# Patient Record
Sex: Female | Born: 1980 | Race: White | Hispanic: No | Marital: Married | Smoking: Never smoker
Health system: Southern US, Community
[De-identification: ages and names within clinical notes are randomized; demographics above are authoritative.]

## PROBLEM LIST (undated history)

## (undated) DIAGNOSIS — R0609 Other forms of dyspnea: Secondary | ICD-10-CM

## (undated) DIAGNOSIS — O1490 Unspecified pre-eclampsia, unspecified trimester: Secondary | ICD-10-CM

## (undated) DIAGNOSIS — IMO0001 Reserved for inherently not codable concepts without codable children: Secondary | ICD-10-CM

## (undated) DIAGNOSIS — R109 Unspecified abdominal pain: Secondary | ICD-10-CM

## (undated) DIAGNOSIS — M1990 Unspecified osteoarthritis, unspecified site: Secondary | ICD-10-CM

## (undated) DIAGNOSIS — R519 Headache, unspecified: Secondary | ICD-10-CM

## (undated) DIAGNOSIS — F319 Bipolar disorder, unspecified: Secondary | ICD-10-CM

## (undated) DIAGNOSIS — R0989 Other specified symptoms and signs involving the circulatory and respiratory systems: Secondary | ICD-10-CM

## (undated) DIAGNOSIS — IMO0002 Reserved for concepts with insufficient information to code with codable children: Secondary | ICD-10-CM

## (undated) DIAGNOSIS — R011 Cardiac murmur, unspecified: Secondary | ICD-10-CM

## (undated) DIAGNOSIS — T7840XA Allergy, unspecified, initial encounter: Secondary | ICD-10-CM

## (undated) DIAGNOSIS — F411 Generalized anxiety disorder: Secondary | ICD-10-CM

## (undated) DIAGNOSIS — F909 Attention-deficit hyperactivity disorder, unspecified type: Secondary | ICD-10-CM

## (undated) DIAGNOSIS — D649 Anemia, unspecified: Secondary | ICD-10-CM

## (undated) DIAGNOSIS — Z87442 Personal history of urinary calculi: Secondary | ICD-10-CM

## (undated) DIAGNOSIS — F3289 Other specified depressive episodes: Secondary | ICD-10-CM

## (undated) DIAGNOSIS — N390 Urinary tract infection, site not specified: Secondary | ICD-10-CM

## (undated) DIAGNOSIS — R609 Edema, unspecified: Secondary | ICD-10-CM

## (undated) DIAGNOSIS — I7300 Raynaud's syndrome without gangrene: Secondary | ICD-10-CM

## (undated) DIAGNOSIS — M797 Fibromyalgia: Secondary | ICD-10-CM

## (undated) DIAGNOSIS — K219 Gastro-esophageal reflux disease without esophagitis: Secondary | ICD-10-CM

## (undated) DIAGNOSIS — R51 Headache: Secondary | ICD-10-CM

## (undated) DIAGNOSIS — F329 Major depressive disorder, single episode, unspecified: Secondary | ICD-10-CM

## (undated) HISTORY — DX: Reserved for concepts with insufficient information to code with codable children: IMO0002

## (undated) HISTORY — DX: Unspecified abdominal pain: R10.9

## (undated) HISTORY — DX: Attention-deficit hyperactivity disorder, unspecified type: F90.9

## (undated) HISTORY — DX: Unspecified osteoarthritis, unspecified site: M19.90

## (undated) HISTORY — DX: Urinary tract infection, site not specified: N39.0

## (undated) HISTORY — DX: Bipolar disorder, unspecified: F31.9

## (undated) HISTORY — PX: TONSILLECTOMY AND ADENOIDECTOMY: SUR1326

## (undated) HISTORY — DX: Generalized anxiety disorder: F41.1

## (undated) HISTORY — DX: Other forms of dyspnea: R06.09

## (undated) HISTORY — DX: Cardiac murmur, unspecified: R01.1

## (undated) HISTORY — PX: HERNIA REPAIR: SHX51

## (undated) HISTORY — DX: Other specified symptoms and signs involving the circulatory and respiratory systems: R09.89

## (undated) HISTORY — PX: SMALL INTESTINE SURGERY: SHX150

## (undated) HISTORY — DX: Anemia, unspecified: D64.9

## (undated) HISTORY — DX: Morbid (severe) obesity due to excess calories: E66.01

## (undated) HISTORY — DX: Reserved for inherently not codable concepts without codable children: IMO0001

## (undated) HISTORY — DX: Unspecified pre-eclampsia, unspecified trimester: O14.90

## (undated) HISTORY — DX: Major depressive disorder, single episode, unspecified: F32.9

## (undated) HISTORY — DX: Other specified depressive episodes: F32.89

## (undated) HISTORY — DX: Gastro-esophageal reflux disease without esophagitis: K21.9

## (undated) HISTORY — PX: LITHOTRIPSY: SUR834

## (undated) HISTORY — DX: Edema, unspecified: R60.9

## (undated) HISTORY — DX: Personal history of urinary calculi: Z87.442

## (undated) HISTORY — PX: INTRAUTERINE DEVICE (IUD) INSERTION: SHX5877

## (undated) HISTORY — DX: Allergy, unspecified, initial encounter: T78.40XA

## (undated) HISTORY — PX: DILATION AND CURETTAGE OF UTERUS: SHX78

---

## 2005-01-23 ENCOUNTER — Emergency Department: Payer: Self-pay | Admitting: Emergency Medicine

## 2005-04-11 ENCOUNTER — Emergency Department: Payer: Self-pay | Admitting: Emergency Medicine

## 2006-02-07 ENCOUNTER — Emergency Department: Payer: Self-pay | Admitting: General Practice

## 2006-02-07 ENCOUNTER — Other Ambulatory Visit: Payer: Self-pay

## 2006-03-16 HISTORY — PX: CHOLECYSTECTOMY: SHX55

## 2006-04-14 ENCOUNTER — Emergency Department: Payer: Self-pay | Admitting: Emergency Medicine

## 2006-04-15 ENCOUNTER — Inpatient Hospital Stay: Payer: Self-pay | Admitting: Surgery

## 2006-04-15 ENCOUNTER — Other Ambulatory Visit: Payer: Self-pay

## 2006-05-09 ENCOUNTER — Inpatient Hospital Stay: Payer: Self-pay

## 2006-12-16 ENCOUNTER — Emergency Department: Payer: Self-pay | Admitting: Emergency Medicine

## 2006-12-23 ENCOUNTER — Ambulatory Visit: Payer: Self-pay | Admitting: Family Medicine

## 2006-12-23 DIAGNOSIS — F319 Bipolar disorder, unspecified: Secondary | ICD-10-CM

## 2006-12-23 DIAGNOSIS — M797 Fibromyalgia: Secondary | ICD-10-CM | POA: Insufficient documentation

## 2006-12-23 DIAGNOSIS — R109 Unspecified abdominal pain: Secondary | ICD-10-CM | POA: Insufficient documentation

## 2006-12-23 DIAGNOSIS — IMO0002 Reserved for concepts with insufficient information to code with codable children: Secondary | ICD-10-CM | POA: Insufficient documentation

## 2006-12-23 DIAGNOSIS — K219 Gastro-esophageal reflux disease without esophagitis: Secondary | ICD-10-CM | POA: Insufficient documentation

## 2006-12-23 HISTORY — DX: Bipolar disorder, unspecified: F31.9

## 2006-12-23 LAB — CONVERTED CEMR LAB
Blood in Urine, dipstick: NEGATIVE
Urobilinogen, UA: NEGATIVE
WBC Urine, dipstick: NEGATIVE

## 2006-12-25 ENCOUNTER — Encounter: Payer: Self-pay | Admitting: Family Medicine

## 2006-12-25 ENCOUNTER — Encounter (INDEPENDENT_AMBULATORY_CARE_PROVIDER_SITE_OTHER): Payer: Self-pay | Admitting: *Deleted

## 2007-01-07 ENCOUNTER — Ambulatory Visit: Payer: Self-pay | Admitting: Family Medicine

## 2007-01-15 LAB — CONVERTED CEMR LAB
AST: 18 units/L (ref 0–37)
Albumin: 3.4 g/dL — ABNORMAL LOW (ref 3.5–5.2)
Alkaline Phosphatase: 65 units/L (ref 39–117)
BUN: 12 mg/dL (ref 6–23)
Bilirubin, Direct: 0.2 mg/dL (ref 0.0–0.3)
Calcium: 9.5 mg/dL (ref 8.4–10.5)
Chloride: 104 meq/L (ref 96–112)
Creatinine, Ser: 0.7 mg/dL (ref 0.4–1.2)
Total Bilirubin: 0.8 mg/dL (ref 0.3–1.2)
Total Protein: 6.1 g/dL (ref 6.0–8.3)
VLDL: 21 mg/dL (ref 0–40)

## 2007-02-06 ENCOUNTER — Ambulatory Visit: Payer: Self-pay | Admitting: Pain Medicine

## 2007-03-05 ENCOUNTER — Encounter: Payer: Self-pay | Admitting: Family Medicine

## 2007-05-01 ENCOUNTER — Encounter: Payer: Self-pay | Admitting: Family Medicine

## 2007-05-13 ENCOUNTER — Encounter: Admission: RE | Admit: 2007-05-13 | Discharge: 2007-05-13 | Payer: Self-pay | Admitting: Rheumatology

## 2007-06-05 ENCOUNTER — Ambulatory Visit: Payer: Self-pay | Admitting: Family Medicine

## 2007-06-05 LAB — CONVERTED CEMR LAB
Ketones, urine, test strip: NEGATIVE
Nitrite: NEGATIVE
Specific Gravity, Urine: 1.02
pH: 6.5

## 2007-06-11 ENCOUNTER — Ambulatory Visit: Payer: Self-pay | Admitting: Family Medicine

## 2007-06-11 DIAGNOSIS — R609 Edema, unspecified: Secondary | ICD-10-CM | POA: Insufficient documentation

## 2007-06-12 LAB — CONVERTED CEMR LAB
ALT: 34 units/L (ref 0–35)
CO2: 29 meq/L (ref 19–32)
Calcium: 9.9 mg/dL (ref 8.4–10.5)
Creatinine, Ser: 0.9 mg/dL (ref 0.4–1.2)
GFR calc Af Amer: 99 mL/min
Glucose, Bld: 95 mg/dL (ref 70–99)
Sodium: 141 meq/L (ref 135–145)
TSH: 2.4 microintl units/mL (ref 0.35–5.50)
Total Bilirubin: 0.6 mg/dL (ref 0.3–1.2)
Total Protein: 6.3 g/dL (ref 6.0–8.3)

## 2007-06-18 ENCOUNTER — Encounter: Payer: Self-pay | Admitting: Family Medicine

## 2007-07-23 ENCOUNTER — Ambulatory Visit: Payer: Self-pay | Admitting: Family Medicine

## 2007-07-23 DIAGNOSIS — R0989 Other specified symptoms and signs involving the circulatory and respiratory systems: Secondary | ICD-10-CM | POA: Insufficient documentation

## 2007-07-23 DIAGNOSIS — R0609 Other forms of dyspnea: Secondary | ICD-10-CM | POA: Insufficient documentation

## 2007-08-25 ENCOUNTER — Ambulatory Visit: Payer: Self-pay | Admitting: Family Medicine

## 2007-11-05 ENCOUNTER — Encounter: Payer: Self-pay | Admitting: Family Medicine

## 2007-11-18 ENCOUNTER — Ambulatory Visit: Payer: Self-pay | Admitting: Family Medicine

## 2007-11-28 ENCOUNTER — Encounter: Payer: Self-pay | Admitting: Family Medicine

## 2008-03-04 ENCOUNTER — Ambulatory Visit: Payer: Self-pay | Admitting: Family Medicine

## 2008-03-04 LAB — CONVERTED CEMR LAB: Rapid Strep: NEGATIVE

## 2008-03-05 ENCOUNTER — Telehealth (INDEPENDENT_AMBULATORY_CARE_PROVIDER_SITE_OTHER): Payer: Self-pay | Admitting: Internal Medicine

## 2008-05-14 ENCOUNTER — Ambulatory Visit: Payer: Self-pay | Admitting: Family Medicine

## 2008-05-17 LAB — CONVERTED CEMR LAB
AST: 31 units/L (ref 0–37)
Alkaline Phosphatase: 62 units/L (ref 39–117)
BUN: 11 mg/dL (ref 6–23)
Basophils Absolute: 0 10*3/uL (ref 0.0–0.1)
Bilirubin, Direct: 0.2 mg/dL (ref 0.0–0.3)
Indirect Bilirubin: 0.3 mg/dL (ref 0.0–0.9)
Lipase: <10 units/L (ref 0–75)
Lymphocytes Relative: 26 % (ref 12–46)
Lymphs Abs: 1.4 10*3/uL (ref 0.7–4.0)
Neutro Abs: 3.7 10*3/uL (ref 1.7–7.7)
Platelets: 324 10*3/uL (ref 150–400)
Potassium: 4.1 meq/L (ref 3.5–5.3)
RDW: 13.7 % (ref 11.5–15.5)
Sodium: 138 meq/L (ref 135–145)
Total Bilirubin: 0.5 mg/dL (ref 0.3–1.2)
WBC: 5.6 10*3/uL (ref 4.0–10.5)

## 2008-06-03 ENCOUNTER — Ambulatory Visit: Payer: Self-pay | Admitting: Family Medicine

## 2008-06-03 LAB — CONVERTED CEMR LAB
Bacteria, UA: 0
Bilirubin Urine: NEGATIVE
Ketones, urine, test strip: NEGATIVE
Urobilinogen, UA: 0.2

## 2008-07-28 ENCOUNTER — Ambulatory Visit: Payer: Self-pay | Admitting: Family Medicine

## 2008-07-28 LAB — CONVERTED CEMR LAB
Nitrite: NEGATIVE
WBC Urine, dipstick: NEGATIVE
pH: 5

## 2008-07-29 ENCOUNTER — Encounter (INDEPENDENT_AMBULATORY_CARE_PROVIDER_SITE_OTHER): Payer: Self-pay | Admitting: Internal Medicine

## 2008-10-15 ENCOUNTER — Ambulatory Visit: Payer: Self-pay | Admitting: Family Medicine

## 2008-10-15 LAB — CONVERTED CEMR LAB
Glucose, Urine, Semiquant: NEGATIVE
pH: 6.5

## 2009-02-01 ENCOUNTER — Ambulatory Visit: Payer: Self-pay | Admitting: Family Medicine

## 2009-02-02 ENCOUNTER — Telehealth: Payer: Self-pay | Admitting: Family Medicine

## 2009-02-03 ENCOUNTER — Telehealth (INDEPENDENT_AMBULATORY_CARE_PROVIDER_SITE_OTHER): Payer: Self-pay | Admitting: *Deleted

## 2010-02-14 ENCOUNTER — Ambulatory Visit
Admission: RE | Admit: 2010-02-14 | Discharge: 2010-02-14 | Payer: Self-pay | Source: Home / Self Care | Attending: Family Medicine | Admitting: Family Medicine

## 2010-02-14 NOTE — Assessment & Plan Note (Signed)
Summary: CONGESTION ST EARS/MK   Vital Signs:  Patient profile:   30 year old female Height:      64 inches Weight:      371.13 pounds BMI:     63.93 Temp:     99 degrees F oral Pulse rate:   80 / minute Pulse rhythm:   regular BP sitting:   112 / 82  (right arm) Cuff size:   large  Vitals Entered By: Delilah Shan CMA Duncan Dull) (February 01, 2009 10:56 AM) CC: Cough, congestion, ST, ears hurt   History of Present Illness: 30 yo with almost 1 week of sinus pressure, runny nose, bilateral ear pain, chills, subjective fever, scratchy throat. This morning, she felt like she was wheezing. coughed so hard last night, started vomiting. Step son has similar symptoms. No rash. No nausea or diarrhea. Taking OTC Mucinex with no relief of symptoms.  Current Medications (verified): 1)  Clonazepam 1 Mg Tabs (Clonazepam) .... Two  Tablets  By Mouth Daily As Needed 2)  Trazodone Hcl 100 Mg Tabs (Trazodone Hcl) .... Take 1 Tab By Mouth At Bedtime 3)  Lamictal Odt 100 Mg Tbdp (Lamotrigine) .... Two At Bedtime 4)  Prilosec 20 Mg Cpdr (Omeprazole) .... Otc One Daily 5)  Yaz 3-0.02 Mg Tabs (Drospirenone-Ethinyl Estradiol) .... As Directed 6)  Lunesta 1 Mg Tabs (Eszopiclone) .... Takes 1.5 Mg. At Bedtime. 7)  Azithromycin 250 Mg  Tabs (Azithromycin) .... 2 By  Mouth Today and Then 1 Daily For 4 Days 8)  Brontex 10-300 Mg Tabs (Guaifenesin-Codeine) .Marland Kitchen.. 1 Tab Every 4 Hours As Needed.  Allergies (verified): No Known Drug Allergies  Review of Systems      See HPI General:  Complains of chills and fever. ENT:  Complains of earache, nasal congestion, sinus pressure, and sore throat; denies difficulty swallowing and ear discharge. CV:  Denies chest pain or discomfort. Resp:  Complains of cough, sputum productive, and wheezing; denies shortness of breath. GI:  Complains of vomiting; denies abdominal pain, diarrhea, and nausea.  Physical Exam  General:  alert, well-developed, well-nourished, and  well-hydrated.   Head:  fontal sinuses TTP, Right > left Ears:  clear fluid bilaterally Nose:  mucosal erythema.   Mouth:  Oral mucosa and oropharynx without lesions or exudates.  Teeth in good repair. Lungs:  Normal respiratory effort, chest expands symmetrically. Scant wheeze left lower lobe Heart:  Normal rate and regular rhythm. S1 and S2 normal without gallop, murmur, click, rub or other extra sounds. Psych:  normally interactive and good eye contact.     Impression & Recommendations:  Problem # 1:  UPPER RESPIRATORY INFECTION, ACUTE (ICD-465.9) Assessment New With probable bronchitis.  Given body habitus, cannot truly rule out pneumonia on exam. Given fevers, chills, and sputum, will cover with abx. Red flags given for immediate follow up. Brotex for cough. Her updated medication list for this problem includes:    Brontex 10-300 Mg Tabs (Guaifenesin-codeine) .Marland Kitchen... 1 tab every 4 hours as needed.  Complete Medication List: 1)  Clonazepam 1 Mg Tabs (Clonazepam) .... Two  tablets  by mouth daily as needed 2)  Trazodone Hcl 100 Mg Tabs (Trazodone hcl) .... Take 1 tab by mouth at bedtime 3)  Lamictal Odt 100 Mg Tbdp (Lamotrigine) .... Two at bedtime 4)  Prilosec 20 Mg Cpdr (Omeprazole) .... Otc one daily 5)  Yaz 3-0.02 Mg Tabs (Drospirenone-ethinyl estradiol) .... As directed 6)  Lunesta 1 Mg Tabs (Eszopiclone) .... Takes 1.5 mg. at bedtime. 7)  Azithromycin 250 Mg Tabs (Azithromycin) .... 2 by  mouth today and then 1 daily for 4 days 8)  Brontex 10-300 Mg Tabs (Guaifenesin-codeine) .Marland Kitchen.. 1 tab every 4 hours as needed.  Patient Instructions: 1)  Take antibiotic as directed.  Drink lots of fluids.  Treat sympotmatically with Mucinex, nasal saline irrigation, and Tylenol/Ibuprofen. You can also try using warm compresses.  Cough suppressant at night. Call if not improving as expected in 5-7 days.  Prescriptions: BRONTEX 10-300 MG TABS (GUAIFENESIN-CODEINE) 1 tab every 4 hours as  needed.  #30 x 0   Entered and Authorized by:   Ruthe Mannan MD   Signed by:   Ruthe Mannan MD on 02/01/2009   Method used:   Print then Give to Patient   RxID:   [id number] AZITHROMYCIN 250 MG  TABS (AZITHROMYCIN) 2 by  mouth today and then 1 daily for 4 days  #6 x 0   Entered and Authorized by:   Ruthe Mannan MD   Signed by:   Ruthe Mannan MD on 02/01/2009   Method used:   Electronically to        [facility]. [address] [phone]* (retail)       [address] [place], [place]  [address]       Ph: [phone]       Fax: [phone]   RxID:   [id number]   Current Allergies (reviewed today): No known allergies

## 2010-02-14 NOTE — Progress Notes (Signed)
Summary: Cannot get [employer] Note From Pharmacy   Caller: [employer] [facility] [address] [place] #[phone] Call For: Dr. Dayton Martes  Summary of Call: Pharmacy cannot get Brontex in tablet or liquid form.  Can you make another selection? Initial call taken by: Delilah Shan CMA Duncan Dull),  February 03, 2009 10:10 AM  Follow-up for Phone Call        Medication phoned to pharmacy.  Follow-up by: Delilah Shan CMA (AAMA),  February 03, 2009 10:51 AM    New/Updated Medications: CHERATUSSIN AC 100-10 MG/5ML SYRP (GUAIFENESIN-CODEINE) 5 ml two times a day as needed cough. Prescriptions: CHERATUSSIN AC 100-10 MG/5ML SYRP (GUAIFENESIN-CODEINE) 5 ml two times a day as needed cough.  #4 ounces x 0   Entered by:   Ruthe Mannan MD   Authorized by:   Delilah Shan CMA (AAMA)   Signed by:   Delilah Shan CMA (AAMA) on 02/03/2009   Method used:   Handwritten   RxID:   [id number]

## 2010-02-14 NOTE — Progress Notes (Signed)
Summary: vomiting and diarrhea  Phone Note Call from Patient Call back at Home Phone [phone]   Caller: Patient Call For: Kerby Nora MD Summary of Call: Patient was in yesterday was given z-pak, says that none of her symptoms have improved, and she started vomiting and diarrhea yesterday before taking z-pak. Still has fever and taking tylenol and motrin just to get it down. Says that she does not know how high it has been because she has not checked it, she just can tell that she has a fever. Please advise.  Initial call taken by: Melody Comas,  February 02, 2009 5:03 PM  Follow-up for Phone Call        She was seen by [facility]..I will forward message to her for further recs.  If she is not available, let me know.  Follow-up by: Kerby Nora MD,  February 03, 2009 10:26 AM  Additional Follow-up for Phone Call Additional follow up Details #1::        Possibly viral, flu like syndrome.  Most of these URIs are viral or bacterial, hard to tell.  Wouldn't imagine her symptoms would already be much improved.  If she is having a lot of sputum and shortness of breath, we can send for chest x ray.   Additional Follow-up by: Ruthe Mannan MD,  February 03, 2009 10:50 AM    Additional Follow-up for Phone Call Additional follow up Details #2::    Left message with female voice to return call.  Lugene Fuquay CMA Duncan Dull)  February 03, 2009 10:54 AM   Patient Advised.  Follow-up by: Delilah Shan CMA (AAMA),  February 03, 2009 11:17 AM

## 2010-02-20 ENCOUNTER — Encounter: Payer: Self-pay | Admitting: Family Medicine

## 2010-02-20 ENCOUNTER — Ambulatory Visit (INDEPENDENT_AMBULATORY_CARE_PROVIDER_SITE_OTHER): Admitting: Family Medicine

## 2010-02-20 DIAGNOSIS — H66019 Acute suppurative otitis media with spontaneous rupture of ear drum, unspecified ear: Secondary | ICD-10-CM

## 2010-02-22 NOTE — Assessment & Plan Note (Signed)
Summary: RIGHT EAR PAIN/ lb   Vital Signs:  Patient profile:   30 year old female Height:      64 inches Weight:      380.25 pounds BMI:     65.51 Temp:     98.3 degrees F oral Pulse rate:   104 / minute Pulse rhythm:   regular BP sitting:   124 / 74  (left arm) Cuff size:   large  Vitals Entered By: Selena Batten Dance CMA (AAMA) (February 14, 2010 4:11 PM) CC: Right ear pain   History of Present Illness: CC: R ear pain  1 d h/o R ear achey constant pain.  ibuprofen helps, then pain comes back.  No fevers, hearing changes.  + muffled.  No drainage.  No recent swimming.    + recent illness with cold, residual cough.  + ear infections as child s/p tubes.  Husband smokes outside.  No h/o asthma/allergies.  Current Medications (verified): 1)  Clonazepam 1 Mg Tabs (Clonazepam) .... Two  Tablets  By Mouth Daily As Needed 2)  Lamictal Odt 100 Mg Tbdp (Lamotrigine) .... Two At Bedtime 3)  Prilosec 20 Mg Cpdr (Omeprazole) .... Otc One Daily 4)  Yaz 3-0.02 Mg Tabs (Drospirenone-Ethinyl Estradiol) .... As Directed 5)  Lunesta 1 Mg Tabs (Eszopiclone) .... Takes 1.5 Mg. At Bedtime. 6)  Wellbutrin Xl 150 Mg Xr24h-Tab (Bupropion Hcl) .Marland Kitchen.. 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   Head:  Normocephalic and atraumatic without obvious abnormalities. No apparent alopecia or balding. Eyes:  No corneal or conjunctival inflammation noted. EOMI. Perrla.  Ears:  L TM clear, canal clear R TM swollen, erythematous, bulging, extra swelling anterioinferior TM ?perf vs growth.  canal irritated, some maceration and with yellow discharge anterior Nose:  mucosal erythema.   Mouth:  Oral mucosa and oropharynx without lesions or exudates.  Teeth in good repair. Neck:  No deformities, masses, or tenderness noted.  no LAD Lungs:  Normal respiratory effort, chest expands symmetrically. Lungs are clear to auscultation, no crackles or wheezes. Heart:  Normal rate and regular rhythm. S1 and S2 normal without gallop, murmur, click, rub or other extra sounds.   Impression & Recommendations:  Problem # 1:  ACUT SUPPRATV OTITIS MEDIA W/SPONT RUP EARDRUM (ICD-382.01) Assessment New  with possible external otitis component?  treat with oral abx as well as ear drops.  RTC 4 wks to re assess TM and ensure healing, reassess possible growth vs just perf, r/o cholesteatoma.  tylenol/ibuprofen for pain  Her updated medication list for this problem includes:    Amoxicillin 875 Mg Tabs (Amoxicillin) .Marland Kitchen... Take one by mouth two times a day x10 days  Complete Medication List: 1)  Clonazepam 1 Mg Tabs (Clonazepam) .... Two  tablets  by mouth daily as needed 2)  Lamictal Odt 100 Mg Tbdp (Lamotrigine) .... Two at bedtime 3)  Prilosec 20 Mg Cpdr (Omeprazole) .... Otc one daily 4)  Yaz 3-0.02 Mg Tabs (Drospirenone-ethinyl estradiol) .... As directed 5)  Lunesta 1 Mg Tabs (Eszopiclone) .... Takes 1.5 mg. at bedtime. 6)  Wellbutrin Xl 150 Mg Xr24h-tab (Bupropion hcl) .Marland Kitchen.. 1 by mouth once daily 7)  Amoxicillin 875 Mg Tabs (Amoxicillin) .... Take one by mouth two times a day x10 days 8)  Cortisporin-tc 3.03-17-08-0.5 Mg/ml Susp (Neomycin-colist-hc-thonzonium) .... Use in  affected ear 4 drops three times a day x 7 days, qs  Patient Instructions: 1)  Return in 3-4 wks for f/u ear. 2)  Looks like middle and outer ear infection. 3)  Take abxs - amoxicillin twice daily for 10 days as well as cortisporin ear drops. 4)  Use ibuprofen/tylenol for ear pain. 5)  Let us know if not getting better as expected. 6)  Use 2nd form birth control as abx decrease effectiveness of yaz. Prescriptions: CORTISPORIN-TC 3.03-17-08-0.5 MG/ML SUSP (NEOMYCIN-COLIST-HC-THONZONIUM) use in affected ear 4 drops three times a day x 7 days, qs  #1 x 0   Entered and Authorized by:   Eustaquio Boyden  MD   Signed by:   Eustaquio Boyden  MD on 02/14/2010   Method used:   Electronically to        [facility]. [address] [phone]* (retail)       [address] [place], [place]  [address]       Ph: [phone]       Fax: [phone]   RxID:   [id number] AMOXICILLIN 875 MG TABS (AMOXICILLIN) take one by mouth two times a day x10 days  #20 x 0   Entered and Authorized by:   Eustaquio Boyden  MD   Signed by:   Eustaquio Boyden  MD on 02/14/2010   Method used:   Electronically to        [facility]. [address] [phone]* (retail)       [address] [place], [place]  [address]       Ph: [phone]       Fax: [phone]   RxID:   [id number]    Orders Added: 1)  Est. Patient Level III [35573]    Current Allergies (reviewed today): No known allergies

## 2010-02-24 ENCOUNTER — Ambulatory Visit (INDEPENDENT_AMBULATORY_CARE_PROVIDER_SITE_OTHER): Admitting: Family Medicine

## 2010-02-24 ENCOUNTER — Encounter: Payer: Self-pay | Admitting: Family Medicine

## 2010-02-24 DIAGNOSIS — H6690 Otitis media, unspecified, unspecified ear: Secondary | ICD-10-CM

## 2010-02-24 DIAGNOSIS — H60399 Other infective otitis externa, unspecified ear: Secondary | ICD-10-CM

## 2010-03-02 NOTE — Assessment & Plan Note (Signed)
Summary: RECHECK EAR, STOPPED UP   Vital Signs:  Patient profile:   30 year old female Weight:      380.25 pounds Temp:     98.1 degrees F oral Pulse rate:   88 / minute Pulse rhythm:   regular BP sitting:   116 / 70  (left arm) Cuff size:   large  Vitals Entered By: Selena Batten Dance CMA (AAMA) (February 20, 2010 10:11 AM) CC: Recheck ear (stopped up and painful)/cough  Hearing Screen 25db HL: Left  500 hz: 25db 1000 hz: 25db 2000 hz: 25db 4000 hz: 25db Right  500 hz: No Response 1000 hz: No Response 2000 hz: 25db 4000 hz: No Response    History of Present Illness: CC: recheck R ear, cough  seen 1 wk ago, dx with suppurative otitis media with ?perf, as well as possible otitis externa, treated with abx.  Doing amoxicillin as well as ear drops.  ibuprofen helps pain but then comes back.  Actually pain much better than previously.  Now feels R ear stopped up, and throbbing pain.  + nausea/vomiting.  No dizziness/vertigo.  + ringing in ears.  Cough also present, productive of green sputum.  No fevers.  Current Medications (verified): 1)  Clonazepam 1 Mg Tabs (Clonazepam) .... Two  Tablets  By Mouth Daily As Needed 2)  Lamictal Odt 100 Mg Tbdp (Lamotrigine) .... Two At Bedtime 3)  Prilosec 20 Mg Cpdr (Omeprazole) .... Otc One Daily 4)  Yaz 3-0.02 Mg Tabs (Drospirenone-Ethinyl Estradiol) .... As Directed 5)  Lunesta 1 Mg Tabs (Eszopiclone) .... Takes 1.5 Mg. At Bedtime. 6)  Wellbutrin Xl 150 Mg Xr24h-Tab (Bupropion Hcl) .Marland Kitchen.. 1 By Mouth Once Daily 7)  Amoxicillin 875 Mg Tabs (Amoxicillin) .... Take One By Mouth Two Times A Day X10 Days 8)  Cortisporin-Tc 3.03-17-08-0.5 Mg/ml Susp (Neomycin-Colist-Hc-Thonzonium) .... 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   Head:  Normocephalic and atraumatic without obvious abnormalities. No apparent alopecia or balding. Eyes:  No corneal or conjunctival inflammation noted. EOMI. Perrla.  Ears:  L TM clear, canal clear R TM improved, still some erythema and bulge. Now some scarring at TM.  at area of prior swelling, decreased swelling as well as what seems like small perf present.  canal irritated, some maceration and with yellow discharge anterior Nose:  mucosal erythema.   Mouth:  Oral mucosa and oropharynx without lesions or exudates.  Teeth in good repair. Neck:  No deformities, masses, or tenderness noted.  no LAD Lungs:  Normal respiratory effort, chest expands symmetrically. Lungs are clear to auscultation, no crackles or wheezes. Heart:  Normal rate and regular rhythm. S1 and S2 normal without gallop, murmur, click, rub or other extra sounds. Pulses:  2+ rad pulses Skin:  Intact without suspicious lesions or rashes   Impression &  Recommendations:  Problem # 1:  ACUT SUPPRATV OTITIS MEDIA W/SPONT RUP EARDRUM (ICD-382.01) stop ear drops as they had HC/neomycin in them.  previously leaning more towards external ear infection but now looking more like perf vs bullous myringitis.  ear pain resolving.  continue amoxicillin.  return either Friday or in 1 wk for f/u and recheck hearing.  Her updated medication list for this problem includes:     Amoxicillin 875 Mg Tabs (Amoxicillin)  Orders: Audiometry [phone])  Complete Medication List: 1)  Clonazepam 1 Mg Tabs (Clonazepam) .... Two  tablets  by mouth daily as needed 2)  Lamictal Odt 100 Mg Tbdp (Lamotrigine) .... Two at bedtime 3)  Prilosec 20 Mg Cpdr (Omeprazole) .... Otc one daily 4)  Yaz 3-0.02 Mg Tabs (Drospirenone-ethinyl estradiol) .... As directed 5)  Lunesta 1 Mg Tabs (Eszopiclone) .... Takes 1.5 mg. at bedtime. 6)  Wellbutrin Xl 150 Mg Xr24h-tab (Bupropion hcl) .Marland Kitchen.. 1 by mouth once daily 7)  Amoxicillin 875 Mg Tabs (Amoxicillin) 8)  Cheratussin Ac 100-10 Mg/54ml Syrp (Guaifenesin-codeine) .... Take one teaspoon q6 hours prn for cough  Patient Instructions: 1)  Stop ear drops. 2)  Increase antibiotic to augmentin x 5 more days. 3)  Return either Friday or next week for recheck.  if hearing loss remaining, we will send you to ENT doctor. 4)  Good to see you today, call clinic with questions. Prescriptions: AUGMENTIN 875-125 MG TABS (AMOXICILLIN-POT CLAVULANATE) one by mouth two times a day x 5 days  #10 x 0   Entered and Authorized by:   Eustaquio Boyden  MD   Signed by:   Eustaquio Boyden  MD on 02/20/2010   Method used:   Electronically to        [facility]. [address] [phone]* (retail)       [address] [place], [place]  [address]       Ph: [phone]       Fax: [phone]   RxID:   [id number] CHERATUSSIN AC 100-10 MG/5ML SYRP (GUAIFENESIN-CODEINE) take one teaspoon q6 hours prn for cough  #240cc x 0   Entered and Authorized by:   Eustaquio Boyden  MD   Signed by:   Eustaquio Boyden  MD on 02/20/2010   Method used:   Print then Give to Patient   RxID:   [id number]    Orders Added: 1)  Audiometry [92552] 2)  Est. Patient Level III [23557]    Current Allergies (reviewed today): No known allergies   Appended Document: RECHECK EAR, STOPPED UP called patient for f/u.  states no change in ear feeling muffled.  does feel like  pain, other sxs better.  informed her that neomycin/HC drops have theoretical small chance of causing ear damage if they get into inner ear, (increased if TM perforation).  however I still think more likely muffled from infection currently under treatment.  advised to keep appointment on friday for f/u. Eustaquio Boyden  MD  February 21, 2010 5:06 PM   update to pt instructions from last note - ended up deciding against changing to augmentin as pt stated pain, other sxs were improving on amoxicillin. advised just finish amoxicillin course.  Eustaquio Boyden  MD  February 21, 2010 5:06 PM   Clinical Lists Changes

## 2010-03-02 NOTE — Assessment & Plan Note (Signed)
Summary: FRIDAY FOLLOW UP/[facility]   Vital Signs:  Patient profile:   30 year old female Weight:      380.25 pounds Temp:     98.3 degrees F oral Pulse rate:   104 / minute Pulse rhythm:   regular BP sitting:   118 / 78  (left arm) Cuff size:   large  Vitals Entered By: Selena Batten Dance CMA (AAMA) (February 24, 2010 9:23 AM) CC: Recheck  Hearing Screen 25db HL: Left  500 hz: 25db 1000 hz: 25db 2000 hz: 25db 4000 hz: 25db Right  500 hz: No Response 1000 hz: 25db 2000 hz: 25db 4000 hz: No Response  40db HL: Left  500 hz: 40db 1000 hz: 40db 2000 hz: 40db 4000 hz: 40db Right  500 hz: 40db 1000 hz: 40db 2000 hz: 40db 4000 hz: No Response    History of Present Illness: CC: recheck ear.  Seen last week with suspected outer and middle ear infection, treated with cortisporin and amoxicillin.  seen Tuesday with concern for TM perforation (new tinnitus, continue muffled hearing), so cortisporin stopped (after 6 day treatmet).  Also had cough, treated with cough syrup.  Presents today for f/u.  Hearing better, still somewhat muffled.  Tinnitus gone.  No fevers/chlils, congestion better.  Cough better, still has cough syrup but hasn't needed to use last 2 days.  Last dose of antibiotic was today.      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   Head:  Normocephalic and atraumatic without obvious abnormalities. No apparent alopecia or balding. Eyes:  No corneal or conjunctival inflammation noted. EOMI. Perrla.  Ears:  L TM clear, canal clear R TM improved, still some erythema no bulge. at area of prior swelling, new scarring and no perforation.  canal continues irritated, some maceration and with yellow discharge anteriorly Mouth:  Oral mucosa and oropharynx without lesions or exudates.  Teeth in good repair. Neck:  No deformities, masses, or tenderness noted.  no LAD Lungs:  Normal respiratory effort, chest expands symmetrically. Lungs are clear to auscultation, no crackles or wheezes. Heart:  Normal rate and regular rhythm. S1 and S2 normal without gallop, murmur, click, rub or other extra sounds. Pulses:  2+ rad pulses   Impression & Recommendations:  Problem # 1:  OTITIS MEDIA, ACUTE (ICD-382.9) Assessment Improved  resolving.  hearing screen with significant improvement.  L ear exam today without perforation, some scarring anterior inferior TM at area of previous lesion (perf vs bullous myringitis).  discussed 2 options with patient-  referral to ENT for evaluation vs continued monitoring.  pt opts for continued monitoring for now.  anticipate recovery of hearing loss which was likely caused by middle ear infection.  pt aware of chance of ear damage from cortisporin drops previously used to treat external otitis, however given limited duration of ear drops (6 days) and unclear whether true perf, very low likelihood of  this occurring, likely more from middle ear infection, resolving.  RTC 2-3 wks for rpt hearing screen, reassess ear.  advised notify us if hearing worsens, if fever returns, if pain returs or if tinnitus returns.  The following medications were removed from the medication list:    Amoxicillin 875 Mg Tabs  (Amoxicillin)  Orders: No Charge Patient Arrived (NCPA0) (NCPA0)  Problem # 2:  EXTERNAL OTITIS (ICD-380.10) Assessment: Improved  resolving.  Orders: No Charge Patient Arrived (NCPA0) (NCPA0)  Complete Medication List: 1)  Clonazepam 1 Mg Tabs (Clonazepam) .... Two  tablets  by mouth daily as needed 2)  Lamictal Odt 100 Mg Tbdp (Lamotrigine) .... Two at bedtime 3)  Prilosec 20 Mg Cpdr (Omeprazole) .... Otc one daily 4)  Yaz 3-0.02 Mg Tabs (Drospirenone-ethinyl estradiol) .... As directed 5)  Lunesta 1 Mg Tabs (Eszopiclone) .... Takes 1.5 mg. at bedtime. 6)  Wellbutrin Xl 150 Mg Xr24h-tab (Bupropion hcl) .Marland Kitchen.. 1 by mouth once daily 7)  Cheratussin Ac 100-10 Mg/63ml Syrp (Guaifenesin-codeine) .... Take one teaspoon q6 hours prn for cough  Patient Instructions: 1)  We are moving in the right direction. 2)  Hearing screen improved since last visit. 3)  Return at previously scheduled appointment for recheck of hearing. 4)  Let us know if hearing worsens, if fever returns or if pain coming back sooner.   Orders Added: 1)  No Charge Patient Arrived (NCPA0) [NCPA0]    Current Allergies (reviewed today): No known allergies

## 2010-03-21 ENCOUNTER — Ambulatory Visit: Admitting: Family Medicine

## 2010-03-21 ENCOUNTER — Ambulatory Visit: Payer: Self-pay | Admitting: Family Medicine

## 2010-03-22 ENCOUNTER — Telehealth: Payer: Self-pay | Admitting: Family Medicine

## 2010-03-28 NOTE — Progress Notes (Signed)
Summary: Status update  Phone Note Outgoing Call   Call placed by: Janee Morn CMA Duncan Dull),  March 22, 2010 9:49 AM Call placed to: Patient Summary of Call: Spoke with patient at the request of Dr. Sharen Hones to get an update on how she is doing. She advised her hearing is back to normal and she is feeling MUCH better. She was very appreciative of the call.  Initial call taken by: Janee Morn CMA Duncan Dull),  March 22, 2010 9:50 AM  Follow-up for Phone Call        noted. thanks. Follow-up by: Eustaquio Boyden  MD,  March 22, 2010 11:33 AM

## 2010-05-12 ENCOUNTER — Encounter: Payer: Self-pay | Admitting: Family Medicine

## 2010-05-15 ENCOUNTER — Encounter: Payer: Self-pay | Admitting: Family Medicine

## 2010-05-15 ENCOUNTER — Ambulatory Visit (INDEPENDENT_AMBULATORY_CARE_PROVIDER_SITE_OTHER): Admitting: Family Medicine

## 2010-05-15 DIAGNOSIS — Z3201 Encounter for pregnancy test, result positive: Secondary | ICD-10-CM | POA: Insufficient documentation

## 2010-05-15 MED ORDER — PRENATE ELITE 90-600-400 MG-MCG-MCG PO TABS: 1.0000 | ORAL_TABLET | Freq: Every day | ORAL | Status: AC

## 2010-05-15 NOTE — Patient Instructions (Signed)
Start the prenatal vitamins, see Shirlee Limerick about your referral before your leave today, and take care.

## 2010-05-15 NOTE — Progress Notes (Signed)
Positive HCG x2 over the weekend at home.  HCG here today is positive.  In supportive relationship.  Married 6+ years. No smoking, no etoh.  Not on prenatal vitamin yet.  LMP was 'end of March', ~04/08/10 per patient.   Some nausea and vomiting, some breast tenderness.    Prev C section 4 years ago.  Needs to reest with another ObGyn.  No VB (prev with some spotting but this resolved), no LOF, no CTX.    H/o preeclampsia.   Meds, vitals, and allergies reviewed.   ROS: See HPI.  Otherwise, noncontributory.  nad Affect bright Morbidly obese Mmm Neck supple rrr ctab abd obese No edema

## 2010-05-15 NOTE — Assessment & Plan Note (Addendum)
Refer to gyn.  Start prenatal vitamin.  Her current meds are 'C' during pregnancy.  D/w pt.  I would like OB input.  I would not stop meds in meantime.  Per patient, psych told her to continue until she talked to OB.  Okay for outpatient fu.  D/w pt about not smoking, drinking.  Affect bright.

## 2010-07-06 ENCOUNTER — Encounter: Payer: Self-pay | Admitting: Maternal & Fetal Medicine

## 2011-10-12 DIAGNOSIS — O99210 Obesity complicating pregnancy, unspecified trimester: Secondary | ICD-10-CM | POA: Insufficient documentation

## 2011-10-12 DIAGNOSIS — F319 Bipolar disorder, unspecified: Secondary | ICD-10-CM

## 2011-10-12 DIAGNOSIS — F3175 Bipolar disorder, in partial remission, most recent episode depressed: Secondary | ICD-10-CM | POA: Insufficient documentation

## 2011-10-12 DIAGNOSIS — Z98891 History of uterine scar from previous surgery: Secondary | ICD-10-CM | POA: Insufficient documentation

## 2011-10-12 DIAGNOSIS — N200 Calculus of kidney: Secondary | ICD-10-CM | POA: Insufficient documentation

## 2011-10-12 HISTORY — DX: Bipolar disorder, unspecified: F31.9

## 2011-10-12 HISTORY — DX: Calculus of kidney: N20.0

## 2011-10-12 HISTORY — DX: History of uterine scar from previous surgery: Z98.891

## 2011-11-09 DIAGNOSIS — O0990 Supervision of high risk pregnancy, unspecified, unspecified trimester: Secondary | ICD-10-CM | POA: Insufficient documentation

## 2011-12-07 DIAGNOSIS — O09899 Supervision of other high risk pregnancies, unspecified trimester: Secondary | ICD-10-CM | POA: Insufficient documentation

## 2011-12-07 HISTORY — DX: Supervision of other high risk pregnancies, unspecified trimester: O09.899

## 2012-01-02 DIAGNOSIS — O4100X0 Oligohydramnios, unspecified trimester, not applicable or unspecified: Secondary | ICD-10-CM | POA: Insufficient documentation

## 2012-01-22 ENCOUNTER — Ambulatory Visit (INDEPENDENT_AMBULATORY_CARE_PROVIDER_SITE_OTHER): Admitting: Family Medicine

## 2012-01-22 ENCOUNTER — Encounter: Payer: Self-pay | Admitting: Family Medicine

## 2012-01-22 VITALS — BP 120/72 | HR 85 | Temp 99.0°F | Ht 64.0 in | Wt 372.5 lb

## 2012-01-22 DIAGNOSIS — O039 Complete or unspecified spontaneous abortion without complication: Secondary | ICD-10-CM

## 2012-01-22 DIAGNOSIS — Z3009 Encounter for other general counseling and advice on contraception: Secondary | ICD-10-CM | POA: Insufficient documentation

## 2012-01-22 MED ORDER — NORGESTIMATE-ETH ESTRADIOL 0.25-35 MG-MCG PO TABS: 1.0000 | ORAL_TABLET | Freq: Every day | ORAL | Status: DC

## 2012-01-22 NOTE — Assessment & Plan Note (Signed)
Discussed different options. Pt went forward with OCPs.

## 2012-01-22 NOTE — Assessment & Plan Note (Signed)
Vaginal canal clear, no blood coming from cervix. No uterine enlargment or pain. Completed abortion likely.

## 2012-01-22 NOTE — Progress Notes (Signed)
  Subjective:    Patient ID: Sierra [name], female    DOB: September 02, 1980, 32 y.o.   MRN: [id number]  HPI 32 year old female presents to clinic today to discuss birth control options.    She reports that on 12/20 she had an abortion  At a [facility] [place]. She was [redacted] weeks along. On [place] there was no amniotic fluid, no function kidneys or bladder and a defect with heart so she chose to terminate the pregnancy. She has a D?C to terminate it. No complications with procedure.  She is tearful but is dealing with it well overall.  She just saw her therapist.  Abilify, and lamictal and klonpin is helping with her mood.  Has appt with psychiatry on 1/13 to consider restarting wellbutrin which she stopped due to pregnancy.   No further abdominal pain, no bleeding. No fever. No N/V. She has had bleeding stop, last time she saw very small drop of blood was 2 days ago.  She has a GYN but does not want to see her about this.  She had an IUD in past but not interested in this again. She is interested in OCPs.  [facility].   No concern of STDs.  Last PAP was 10/2011 at [facility].    Review of Systems  Constitutional: Negative for fever and fatigue.  HENT: Negative for ear pain.   Eyes: Negative for pain.  Respiratory: Negative for chest tightness and shortness of breath.   Cardiovascular: Negative for chest pain, palpitations and leg swelling.  Gastrointestinal: Negative for abdominal pain.  Genitourinary: Negative for dysuria.       Objective:   Physical Exam  Constitutional: She appears well-developed and well-nourished.       Morbidly obese  Neck: Normal range of motion. Neck supple. No thyromegaly present.  Cardiovascular: Normal rate and regular rhythm.   Pulmonary/Chest: Effort normal and breath sounds normal.  Abdominal: Soft. Bowel sounds are normal. She exhibits no distension. There is no tenderness. There is no rebound and no guarding.  Genitourinary:  Vagina normal. Pelvic exam was performed with patient supine. No labial fusion. There is no rash, tenderness, lesion or injury on the right labia. There is no rash, tenderness, lesion or injury on the left labia. Uterus is not deviated, not enlarged, not fixed and not tender. Cervix exhibits no motion tenderness, no discharge and no friability. Right adnexum displays no mass, no tenderness and no fullness. Left adnexum displays no mass, no tenderness and no fullness. No erythema, tenderness or bleeding around the vagina. No foreign body around the vagina. No signs of injury around the vagina. No vaginal discharge found.          Assessment & Plan:

## 2012-01-22 NOTE — Patient Instructions (Addendum)
Start birth control on first Sunday after next menses.

## 2012-06-02 ENCOUNTER — Encounter: Payer: Self-pay | Admitting: Family Medicine

## 2012-06-02 ENCOUNTER — Ambulatory Visit (INDEPENDENT_AMBULATORY_CARE_PROVIDER_SITE_OTHER): Admitting: Family Medicine

## 2012-06-02 DIAGNOSIS — Z3009 Encounter for other general counseling and advice on contraception: Secondary | ICD-10-CM

## 2012-06-02 MED ORDER — DROSPIRENONE-ETHINYL ESTRADIOL 3-0.02 MG PO TABS: 1.0000 | ORAL_TABLET | Freq: Every day | ORAL | Status: DC

## 2012-06-02 NOTE — Progress Notes (Signed)
  Subjective:    Patient ID: Sierra [name], female    DOB: [birth year], 33 y.o.   MRN: [id number]  HPI  32 year old female with history of morbid obesity, fibromyalgia, bipolar, depression and anxiety presents with Dysfunctional uterine bleeding.  She reports that since January when she started birth control... She has been using  a tampon every every 2 hours for 3 days. Cycle lasts 6-7 days total. No cramping, that was better than in past. No spotting in between menses. Yaz worked well for her in past...was not to expensive in past.  Here mood medications include abilify, wellbutrin, lamictal and klonopin. These are prescribed by a psychiatrist.  She also has concerns about weight management.  SHe is having difficulty losing weight. 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 Wt Readings from Last 3 Encounters:  06/02/12 380 lb 8 oz (172.594 kg)  01/22/12 372 lb 8 oz (168.965 kg)  05/15/10 387 lb 0.6 oz (175.56 kg)   She is interested in nutritionist She feels like she does not know what best choices are. She wants to involve her family.  As far as exercsie: she has not started walking. She has hip pain with walking.      Review of Systems  Constitutional: Negative for fever and fatigue.  HENT: Negative for ear pain.   Eyes: Negative for pain.  Respiratory: Negative for chest tightness and shortness of breath.   Cardiovascular: Negative for chest pain, palpitations and leg swelling.  Gastrointestinal: Negative for abdominal pain.  Genitourinary: Negative for dysuria.       Objective:   Physical Exam  Constitutional: Vital signs are normal. She appears well-developed and well-nourished. She is cooperative.  Non-toxic appearance. She does not appear ill. No distress.  Morbidly obese  HENT:  Head: Normocephalic.  Right Ear: Hearing, tympanic membrane, external ear and ear canal normal. Tympanic membrane is not erythematous, not retracted and not bulging.   Left Ear: Hearing, tympanic membrane, external ear and ear canal normal. Tympanic membrane is not erythematous, not retracted and not bulging.  Nose: No mucosal edema or rhinorrhea. Right sinus exhibits no maxillary sinus tenderness and no frontal sinus tenderness. Left sinus exhibits no maxillary sinus tenderness and no frontal sinus tenderness.  Mouth/Throat: Uvula is midline, oropharynx is clear and moist and mucous membranes are normal.  Eyes: Conjunctivae, EOM and lids are normal. Pupils are equal, round, and reactive to light. No foreign bodies found.  Neck: Trachea normal and normal range of motion. Neck supple. Carotid bruit is not present. No mass and no thyromegaly present.  Cardiovascular: Normal rate, regular rhythm, S1 normal, S2 normal, normal heart sounds, intact distal pulses and normal pulses.  Exam reveals no gallop and no friction rub.   No murmur heard. Pulmonary/Chest: Effort normal and breath sounds normal. Not tachypneic. No respiratory distress. She has no decreased breath sounds. She has no wheezes. She has no rhonchi. She has no rales.  Abdominal: Soft. Normal appearance and bowel sounds are normal. There is no tenderness.  Neurological: She is alert.  Skin: Skin is warm, dry and intact. No rash noted.  Psychiatric: Her speech is normal and behavior is normal. Judgment and thought content normal. Her mood appears not anxious. Cognition and memory are normal. She does not exhibit a depressed mood.          Assessment & Plan:

## 2012-06-02 NOTE — Patient Instructions (Addendum)
Change OCP to Yaz. Let me know if bleeding issues continue. For exercise: stretch before and after, try yoga or pilates video, consider water aerobics, try walking 10 minutes a day for for 1 week then increase by 2 minutes a week. Stop at the front desk to set up nutritionist. Avoid liquid calories. Decrease or cut out sausage and bacon. Follow up 1 month weight loss.  Can try on iphone.. myfitness pal.

## 2012-06-02 NOTE — Assessment & Plan Note (Signed)
Discussed options... Will change to YAz as she did well with this in past.

## 2012-06-02 NOTE — Assessment & Plan Note (Signed)
Total visit time 30 minutes, > 50% spent counseling and cordinating patients care. Counseled on diet and lifestyle changes. Set up exercsie plan. Referred to nutritionist.

## 2012-07-04 ENCOUNTER — Ambulatory Visit: Admitting: Family Medicine

## 2012-08-28 ENCOUNTER — Ambulatory Visit: Payer: Self-pay | Admitting: Family Medicine

## 2012-09-02 ENCOUNTER — Ambulatory Visit: Admitting: Family Medicine

## 2012-09-02 DIAGNOSIS — Z0289 Encounter for other administrative examinations: Secondary | ICD-10-CM

## 2012-09-15 ENCOUNTER — Ambulatory Visit: Payer: Self-pay | Admitting: Family Medicine

## 2012-09-16 ENCOUNTER — Ambulatory Visit (INDEPENDENT_AMBULATORY_CARE_PROVIDER_SITE_OTHER): Admitting: Family Medicine

## 2012-09-16 ENCOUNTER — Encounter: Payer: Self-pay | Admitting: Family Medicine

## 2012-09-16 DIAGNOSIS — R5381 Other malaise: Secondary | ICD-10-CM | POA: Insufficient documentation

## 2012-09-16 DIAGNOSIS — M778 Other enthesopathies, not elsewhere classified: Secondary | ICD-10-CM | POA: Insufficient documentation

## 2012-09-16 DIAGNOSIS — G5600 Carpal tunnel syndrome, unspecified upper limb: Secondary | ICD-10-CM

## 2012-09-16 DIAGNOSIS — G5601 Carpal tunnel syndrome, right upper limb: Secondary | ICD-10-CM | POA: Insufficient documentation

## 2012-09-16 DIAGNOSIS — Z13220 Encounter for screening for lipoid disorders: Secondary | ICD-10-CM

## 2012-09-16 DIAGNOSIS — M65839 Other synovitis and tenosynovitis, unspecified forearm: Secondary | ICD-10-CM

## 2012-09-16 LAB — COMPREHENSIVE METABOLIC PANEL
BUN: 9 mg/dL (ref 6–23)
CO2: 26 mEq/L (ref 19–32)
Calcium: 9.4 mg/dL (ref 8.4–10.5)
Chloride: 103 mEq/L (ref 96–112)
Creatinine, Ser: 0.8 mg/dL (ref 0.4–1.2)
GFR: 84.65 mL/min (ref >60.00)

## 2012-09-16 LAB — LIPID PANEL
HDL: 38.1 mg/dL — ABNORMAL LOW (ref >39.00)
Triglycerides: 156 mg/dL — ABNORMAL HIGH (ref 0.0–149.0)

## 2012-09-16 LAB — HEMOGLOBIN A1C: Hgb A1c MFr Bld: 4.9 % (ref 4.6–6.5)

## 2012-09-16 MED ORDER — MELOXICAM 15 MG PO TABS: 15.0000 mg | ORAL_TABLET | Freq: Every day | ORAL | Status: DC

## 2012-09-16 NOTE — Patient Instructions (Addendum)
Stop at lab on your way out.  Contiue working on lifestyle changes, get back to exercise.  Follow up on weight in 1 month. Start meloxicam for inflammation in wrist.  Wear wrist brace at bedtime. Follow up if not improving in 2 weeks or wait till 9month follow up.

## 2012-09-16 NOTE — Assessment & Plan Note (Signed)
Brace at night

## 2012-09-16 NOTE — Progress Notes (Signed)
Subjective:    Patient ID: Sierra [name], female    DOB: 02-Oct-1980, 32 y.o.   MRN: [id number]  HPI  32 yea rold female returns for follpow up on weight management. She was last seen for this in 05/2012. At that time she was referred to a nutritionist.  Lifestyle changes set up: Avoid liquid calories.  Decrease or cut out sausage and bacon.  Can try on iphone.. myfitness pal. Increase activity.  Wt Readings from Last 3 Encounters:  09/16/12 388 lb 12 oz (176.336 kg)  06/02/12 380 lb 8 oz (172.594 kg)  01/22/12 372 lb 8 oz (168.965 kg)   She has not had any weight loss since last OV. She has made a nutrition plan. She has changed to crystal lite, eating more lean meat, decreasing rice and potatos. Skips breakfast and lunch though and just eats dinner. She says she does this because she feels very tired after eating, has poor energy all the time. She has not had labs in a while.  Today she also reports noting  a sudden popping in right wrist few weeks ago when she was pulling something down from shelf. Since then pain in wrist on palmar surface of wrist. Pain with gripping, 2nd and 3rd fingers aching. No numbness, she feels like grip strength is decrease. She is on computer a lot, she also crochets. Ibuprofen helps minimally.    Review of Systems  Constitutional: Negative for fever and fatigue.  HENT: Negative for ear pain.   Eyes: Negative for pain.  Respiratory: Negative for chest tightness and shortness of breath.   Cardiovascular: Negative for chest pain, palpitations and leg swelling.  Gastrointestinal: Negative for abdominal pain.  Genitourinary: Negative for dysuria.       Objective:   Physical Exam  Constitutional: Vital signs are normal. She appears well-developed and well-nourished. She is cooperative.  Non-toxic appearance. She does not appear ill. No distress.  HENT:  Head: Normocephalic.  Right Ear: Hearing, tympanic membrane, external ear and ear canal  normal. Tympanic membrane is not erythematous, not retracted and not bulging.  Left Ear: Hearing, tympanic membrane, external ear and ear canal normal. Tympanic membrane is not erythematous, not retracted and not bulging.  Nose: No mucosal edema or rhinorrhea. Right sinus exhibits no maxillary sinus tenderness and no frontal sinus tenderness. Left sinus exhibits no maxillary sinus tenderness and no frontal sinus tenderness.  Mouth/Throat: Uvula is midline, oropharynx is clear and moist and mucous membranes are normal.  Eyes: Conjunctivae, EOM and lids are normal. Pupils are equal, round, and reactive to light. Lids are everted and swept, no foreign bodies found.  Neck: Trachea normal and normal range of motion. Neck supple. Carotid bruit is not present. No mass and no thyromegaly present.  Cardiovascular: Normal rate, regular rhythm, S1 normal, S2 normal, normal heart sounds, intact distal pulses and normal pulses.  Exam reveals no gallop and no friction rub.   No murmur heard. Pulmonary/Chest: Effort normal and breath sounds normal. Not tachypneic. No respiratory distress. She has no decreased breath sounds. She has no wheezes. She has no rhonchi. She has no rales.  Abdominal: Soft. Normal appearance and bowel sounds are normal. There is no tenderness.  Musculoskeletal:       Right wrist: She exhibits tenderness. She exhibits normal range of motion, no bony tenderness, no swelling and no effusion.  Neg finkelstein, positive tinel and phalen's on right, ttp over tendons radially.  Neurological: She is alert.  Skin: Skin is warm, dry  and intact. No rash noted.  Psychiatric: Her speech is normal and behavior is normal. Judgment and thought content normal. Her mood appears not anxious. Cognition and memory are normal. She does not exhibit a depressed mood.          Assessment & Plan:

## 2012-09-16 NOTE — Assessment & Plan Note (Signed)
Eval with labs. 

## 2012-09-16 NOTE — Assessment & Plan Note (Signed)
Counseled on weight loss changes and lifestyle.  Get back to exercise.  Will eval with labs to eval risk factors.

## 2012-09-16 NOTE — Assessment & Plan Note (Signed)
Brace at night, NSAIDs, follow up if not improving as expected.

## 2012-10-17 ENCOUNTER — Encounter: Payer: Self-pay | Admitting: Family Medicine

## 2012-10-17 ENCOUNTER — Ambulatory Visit (INDEPENDENT_AMBULATORY_CARE_PROVIDER_SITE_OTHER): Admitting: Family Medicine

## 2012-10-17 VITALS — BP 115/73 | HR 67 | Temp 98.4°F | Ht 64.0 in | Wt 383.8 lb

## 2012-10-17 DIAGNOSIS — M65839 Other synovitis and tenosynovitis, unspecified forearm: Secondary | ICD-10-CM

## 2012-10-17 DIAGNOSIS — M778 Other enthesopathies, not elsewhere classified: Secondary | ICD-10-CM

## 2012-10-17 NOTE — Patient Instructions (Addendum)
Keep up the great work. Continue to increase length of exercise.  Work on healthy options and continue work on portion size.

## 2012-10-17 NOTE — Assessment & Plan Note (Signed)
Improved with brace on right wrist.

## 2012-10-17 NOTE — Progress Notes (Signed)
  Subjective:    Patient ID: Sierra [name], female    DOB: 1980/08/13, 32 y.o.   MRN: [id number]  HPI  32 year old female returns for follpow up on weight management. She was last seen for this in 09/2012.  She was referred to a nutritionist.  Lifestyle changes set up: Avoid liquid calories.  Decrease or cut out sausage and bacon.  Can try on iphone.. myfitness pal.  Increase activity.  She has now in last month changes to whole grain cereal. Drinking crystal light and water. Sandwiches at lunch. Decreasing portion size. Trying to eat wheat thins, makes baggies ahead of time. She is eating greek yogurt. She has not been able to exercise much but she is working on it. 2-4 times  week.Melene Plan on walks 20 min with son. She has been increasing 2 min every week.   She has lost 5 lbs! Wt Readings from Last 3 Encounters:  10/17/12 383 lb 12 oz (174.068 kg)  09/16/12 388 lb 12 oz (176.336 kg)  06/02/12 380 lb 8 oz (172.594 kg)   Carpal tunnel is better when she wears the brace. Not needing meloxicam, has not fllled.     Review of Systems  Constitutional: Negative for fatigue.  HENT: Negative for ear pain.   Eyes: Negative for pain.  Respiratory: Negative for shortness of breath and wheezing.   Cardiovascular: Negative for chest pain and leg swelling.  Gastrointestinal: Negative for abdominal pain.       Objective:   Physical Exam  Constitutional: Vital signs are normal. She appears well-developed and well-nourished. She is cooperative.  Non-toxic appearance. She does not appear ill. No distress.  Morbidly obese  HENT:  Head: Normocephalic.  Right Ear: Hearing, tympanic membrane, external ear and ear canal normal. Tympanic membrane is not erythematous, not retracted and not bulging.  Left Ear: Hearing, tympanic membrane, external ear and ear canal normal. Tympanic membrane is not erythematous, not retracted and not bulging.  Nose: No mucosal edema or rhinorrhea. Right sinus  exhibits no maxillary sinus tenderness and no frontal sinus tenderness. Left sinus exhibits no maxillary sinus tenderness and no frontal sinus tenderness.  Mouth/Throat: Uvula is midline, oropharynx is clear and moist and mucous membranes are normal.  Eyes: Conjunctivae, EOM and lids are normal. Pupils are equal, round, and reactive to light. Lids are everted and swept, no foreign bodies found.  Neck: Trachea normal and normal range of motion. Neck supple. Carotid bruit is not present. No mass and no thyromegaly present.  Cardiovascular: Normal rate, regular rhythm, S1 normal, S2 normal, normal heart sounds, intact distal pulses and normal pulses.  Exam reveals no gallop and no friction rub.   No murmur heard. Pulmonary/Chest: Effort normal and breath sounds normal. Not tachypneic. No respiratory distress. She has no decreased breath sounds. She has no wheezes. She has no rhonchi. She has no rales.  Abdominal: Soft. Normal appearance and bowel sounds are normal. There is no tenderness.  Neurological: She is alert.  Skin: Skin is warm, dry and intact. No rash noted.  Psychiatric: Her speech is normal and behavior is normal. Judgment and thought content normal. Her mood appears not anxious. Cognition and memory are normal. She does not exhibit a depressed mood.          Assessment & Plan:

## 2012-10-17 NOTE — Assessment & Plan Note (Signed)
Doing great with lifestyle changes. Congratulated and encouraged pt. Gradually increase length of exxercise. Try to incorporate yoga or pilates with son. Work on healthy options and potion size.

## 2012-11-20 ENCOUNTER — Other Ambulatory Visit: Payer: Self-pay

## 2012-11-26 ENCOUNTER — Emergency Department: Payer: Self-pay | Admitting: Emergency Medicine

## 2012-11-26 LAB — URINALYSIS, COMPLETE
Bacteria: NONE SEEN
Bilirubin,UR: NEGATIVE
Glucose,UR: NEGATIVE mg/dL (ref 0–75)
Ketone: NEGATIVE
Leukocyte Esterase: NEGATIVE
Protein: 30
RBC,UR: 177 /HPF (ref 0–5)
Specific Gravity: 1.023 (ref 1.003–1.030)

## 2012-11-28 ENCOUNTER — Ambulatory Visit: Admitting: Family Medicine

## 2013-02-05 ENCOUNTER — Emergency Department: Payer: Self-pay | Admitting: Emergency Medicine

## 2013-02-06 LAB — URINALYSIS, COMPLETE
Bacteria: NONE SEEN
Bilirubin,UR: NEGATIVE
Glucose,UR: NEGATIVE mg/dL (ref 0–75)
KETONE: NEGATIVE
Leukocyte Esterase: NEGATIVE
Nitrite: NEGATIVE
Ph: 6 (ref 4.5–8.0)
RBC,UR: 3178 /HPF (ref 0–5)
Specific Gravity: 1.019 (ref 1.003–1.030)
Squamous Epithelial: 4

## 2013-02-06 LAB — CBC
HCT: 43.5 % (ref 35.0–47.0)
HGB: 14.2 g/dL (ref 12.0–16.0)
MCH: 28.5 pg (ref 26.0–34.0)
MCHC: 32.8 g/dL (ref 32.0–36.0)
MCV: 87 fL (ref 80–100)
Platelet: 231 10*3/uL (ref 150–440)
RBC: 4.99 10*6/uL (ref 3.80–5.20)
RDW: 13.8 % (ref 11.5–14.5)
WBC: 4.4 10*3/uL (ref 3.6–11.0)

## 2013-02-06 LAB — HCG, QUANTITATIVE, PREGNANCY: BETA HCG, QUANT.: 1408 m[IU]/mL — AB

## 2013-04-21 ENCOUNTER — Ambulatory Visit (INDEPENDENT_AMBULATORY_CARE_PROVIDER_SITE_OTHER): Admitting: Family Medicine

## 2013-04-21 ENCOUNTER — Encounter: Payer: Self-pay | Admitting: Family Medicine

## 2013-04-21 ENCOUNTER — Ambulatory Visit: Payer: Self-pay | Admitting: Family Medicine

## 2013-04-21 VITALS — BP 116/60 | HR 97 | Temp 98.2°F | Ht 62.75 in | Wt 367.0 lb

## 2013-04-21 DIAGNOSIS — M25561 Pain in right knee: Secondary | ICD-10-CM

## 2013-04-21 DIAGNOSIS — M25559 Pain in unspecified hip: Secondary | ICD-10-CM

## 2013-04-21 DIAGNOSIS — M25569 Pain in unspecified knee: Secondary | ICD-10-CM

## 2013-04-21 DIAGNOSIS — M25551 Pain in right hip: Secondary | ICD-10-CM | POA: Insufficient documentation

## 2013-04-21 DIAGNOSIS — M2550 Pain in unspecified joint: Secondary | ICD-10-CM

## 2013-04-21 LAB — RHEUMATOID FACTOR

## 2013-04-21 MED ORDER — DICLOFENAC SODIUM 75 MG PO TBEC
75.0000 mg | DELAYED_RELEASE_TABLET | Freq: Two times a day (BID) | ORAL | Status: DC
Start: 2013-04-21 — End: 2013-04-28

## 2013-04-21 NOTE — Assessment & Plan Note (Signed)
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 Start NSAID.

## 2013-04-21 NOTE — Progress Notes (Signed)
   Subjective:    Patient ID: Sierra [name], female    DOB: 1980-10-19, 33 y.o.   MRN: [id number]  HPI  33 year old morbidly obese female with history of fibromyalgia  presents with ongoing stiffness in  B hips, knees, back and neck x 6 months. Now pain in last 2 months in right knee and right hip. AM stiffness improves after 1 hour but then worsens again later in the day. Entire knee and lateral hip pain.. Pain scale 8/10. No joint redness, and no joint swelling.  She has used tylenol and motrin every 6 hours. No falls.  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 Has not had any past X-rays.  Mom: osteoarthritis. No family history of auto immune disease.   Review of Systems  Constitutional: Positive for fatigue. Negative for fever.  HENT: Negative for ear pain.   Eyes: Negative for pain.  Respiratory: Negative for chest tightness and shortness of breath.   Cardiovascular: Negative for chest pain, palpitations and leg swelling.  Gastrointestinal: Negative for abdominal pain.  Genitourinary: Negative for dysuria.       Objective:   Physical Exam  Constitutional:  Morbidly obese  HENT:  Head: Normocephalic.  Eyes: Conjunctivae are normal. Pupils are equal, round, and reactive to light.  Neck: Normal range of motion. Neck supple. No thyromegaly present.  Cardiovascular: Normal rate.   No murmur heard. Pulmonary/Chest: Effort normal and breath sounds normal. No respiratory distress. She has no wheezes.  Abdominal: Soft. Bowel sounds are normal.  Musculoskeletal:       Right hip: She exhibits decreased range of motion. She exhibits no tenderness.       Right knee: She exhibits decreased range of motion. Tenderness found. Medial joint line and lateral joint line tenderness noted. No MCL and no LCL tenderness noted.    Right neck, shoulder also stiff.  No pain/ redness or deformity in any jpoints.  Exam is limited by morbid obesity.          Assessment & Plan:

## 2013-04-21 NOTE — Assessment & Plan Note (Signed)
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 Start NSAID.

## 2013-04-21 NOTE — Progress Notes (Signed)
Pre visit review using our clinic review tool, if applicable. No additional management support is needed unless otherwise documented below in the visit note. 

## 2013-04-21 NOTE — Assessment & Plan Note (Signed)
Given persistent stiffness and pain will eval for auto immune disease.

## 2013-04-21 NOTE — Patient Instructions (Signed)
Stop at lab and X-ray on way out. Start diclofenac twice daily, call if pain not improving in 1-2 weeks. Stop all other OTC pain meds.

## 2013-04-22 ENCOUNTER — Encounter: Payer: Self-pay | Admitting: Family Medicine

## 2013-04-22 LAB — SEDIMENTATION RATE: Sed Rate: 34 mm/hr — ABNORMAL HIGH (ref 0–22)

## 2013-04-22 LAB — CYCLIC CITRUL PEPTIDE ANTIBODY, IGG: Cyclic Citrullin Peptide Ab: <2 U/mL (ref 0.0–5.0)

## 2013-04-28 ENCOUNTER — Telehealth: Payer: Self-pay | Admitting: Family Medicine

## 2013-04-28 MED ORDER — MELOXICAM 15 MG PO TABS: 15.0000 mg | ORAL_TABLET | Freq: Every day | ORAL | Status: DC

## 2013-04-28 NOTE — Telephone Encounter (Signed)
Sierra [name] notified prescription for Meloxicam 15 mg to take one tablet daily has been sent to her pharmacy.  Advised to try this medication and call us back in 1 to 2 weeks if not helping.

## 2013-04-28 NOTE — Telephone Encounter (Signed)
Pt was recently prescribed medication for inflammtion and pain and pt says its not helping as good as she had hoped for and wanted to know if there was anything else she could try. Please advise.

## 2013-04-28 NOTE — Telephone Encounter (Signed)
Have her let me know if meloxicam is not helping either after 1-2 weeks.

## 2013-04-30 ENCOUNTER — Ambulatory Visit
Admission: RE | Admit: 2013-04-30 | Discharge: 2013-04-30 | Disposition: A | Discharge status: Home / Self Care | Source: Ambulatory Visit | Attending: Family Medicine | Admitting: Family Medicine

## 2013-04-30 DIAGNOSIS — M25561 Pain in right knee: Secondary | ICD-10-CM

## 2013-05-11 ENCOUNTER — Telehealth: Payer: Self-pay

## 2013-05-11 NOTE — Telephone Encounter (Signed)
Please have pt schedule appt for re-eval ASAP to determine which next imaging step is most appropriate.

## 2013-05-11 NOTE — Telephone Encounter (Signed)
Pt left v/m; Meloxicam is not helping pain;pain feels like spreading, pain goes from back down pts legs and feet are tingling on and off. Pt request a scan to be done since xrays did not show what causing pain. Pt request cb.

## 2013-05-12 NOTE — Telephone Encounter (Signed)
Patient notified as instructed by telephone.  Sierra [name] was not at home and did not have her calendar, so she requested to call back to schedule the appointment for re-evaluation.

## 2013-05-22 ENCOUNTER — Ambulatory Visit (INDEPENDENT_AMBULATORY_CARE_PROVIDER_SITE_OTHER): Admitting: Family Medicine

## 2013-05-22 ENCOUNTER — Encounter: Payer: Self-pay | Admitting: Family Medicine

## 2013-05-22 VITALS — BP 143/73 | HR 69 | Temp 98.4°F | Ht 62.75 in | Wt 374.5 lb

## 2013-05-22 DIAGNOSIS — IMO0001 Reserved for inherently not codable concepts without codable children: Secondary | ICD-10-CM

## 2013-05-22 DIAGNOSIS — M79605 Pain in left leg: Principal | ICD-10-CM

## 2013-05-22 DIAGNOSIS — M79609 Pain in unspecified limb: Secondary | ICD-10-CM

## 2013-05-22 DIAGNOSIS — M79604 Pain in right leg: Secondary | ICD-10-CM | POA: Insufficient documentation

## 2013-05-22 MED ORDER — TRAMADOL HCL 50 MG PO TABS: 50.0000 mg | ORAL_TABLET | Freq: Two times a day (BID) | ORAL | Status: DC

## 2013-05-22 MED ORDER — GABAPENTIN 100 MG PO CAPS
ORAL_CAPSULE | ORAL | Status: DC
Start: 2013-05-22 — End: 2013-06-05

## 2013-05-22 NOTE — Assessment & Plan Note (Signed)
Most likely secondary to fibromyalgia, but Pain in legs has some red flags for spical stenosis.  Will start treatment with Neurontin and tramadol.  Stop NSAID as not beneficial. Consider eval of spine for stenosis if not improving as expected.  Total visit time 30 minutes, > 50% spent counseling and cordinating patients care.

## 2013-05-22 NOTE — Progress Notes (Signed)
   Subjective:    Patient ID: Sierra [name], female    DOB: July 05, 1980, 33 y.o.   MRN: [id number]  Leg Pain     33 year old female morbidly obese with hx of fibromyalgia female presents with continued B leg pain.  At last OV she reported 6 months of stiffness in B hips, knees, back and neck x 6 months. Given pain was greatest in right knee and right hip.  X-rays were performed showing no arthritis and no fracture. Lab eval for RA showed neg ra, neg anticcp antibodies, neg sed rate, She was started on diclofenac... Not helpful. Started on meloxicam 15 mg daily... No benefit.   Today she reports that she has now progressed to pain diffusely in B legs hip to feet. Muscle pain and joint pain. Occ feet tingle, no numbness. Weakness in B legs.  Pain is worst going sitting to standing, when she is moving standing she feels well. She has to have a cart shopping to relieve pain in legs by leaning over. She has to sit down quickly. Low back pain, but this is not new for her.  No current neck pain but does have trigger points.  In past she was dx by Dr. Corliss Skainseveshwar for fibromyalgia.  She had SE to lyrica. Never tried neurontin.  Tramadol helped with pain in the past.  Cymbalta no SE but not sure if helped. Never tried amitriptyline, tried imipramine in past without SE, ? Effect.   Per pt mood well controlled on abilify and wellbutrin.  Review of Systems  Constitutional: Negative for fever and fatigue.  HENT: Negative for ear pain.   Eyes: Negative for pain.  Respiratory: Negative for chest tightness and shortness of breath.   Cardiovascular: Negative for chest pain, palpitations and leg swelling.  Gastrointestinal: Negative for abdominal pain.  Genitourinary: Negative for dysuria.       Objective:   Physical Exam  Constitutional:  Morbidly obese  HENT:  Head: Normocephalic.  Eyes: Conjunctivae are normal. Pupils are equal, round, and reactive to light.  Neck: Normal range of  motion. Neck supple. No thyromegaly present.  Cardiovascular: Normal rate.   No murmur heard. Pulmonary/Chest: Effort normal and breath sounds normal. No respiratory distress. She has no wheezes.  Abdominal: Soft. Bowel sounds are normal.  Musculoskeletal:       Right hip: She exhibits normal range of motion and no tenderness.       Left hip: She exhibits normal range of motion and no tenderness.       Right knee: She exhibits normal range of motion. Tenderness found. Medial joint line and lateral joint line tenderness noted. No MCL and no LCL tenderness noted.       Lumbar back: She exhibits tenderness. She exhibits normal range of motion and no bony tenderness.  10 fibromyalgia trigger points very sensitive. Neg SLR, neg Faber's  Exam is limited by morbid obesity.  Neurological: She has normal strength. No cranial nerve deficit or sensory deficit. Gait normal.          Assessment & Plan:

## 2013-05-22 NOTE — Patient Instructions (Signed)
Stop meloxicam. Start neurontin at bedtime, if tolerating increase to either 100 mg three times a day or 300 mg at bedtime. Use tramadol for breakthrough pain. Follow up in 2 weeks 30 min OV.

## 2013-05-22 NOTE — Progress Notes (Signed)
Pre visit review using our clinic review tool, if applicable. No additional management support is needed unless otherwise documented below in the visit note. 

## 2013-05-25 ENCOUNTER — Ambulatory Visit: Payer: Self-pay | Admitting: Family Medicine

## 2013-06-03 ENCOUNTER — Encounter: Payer: Self-pay | Admitting: Internal Medicine

## 2013-06-03 ENCOUNTER — Ambulatory Visit (INDEPENDENT_AMBULATORY_CARE_PROVIDER_SITE_OTHER): Admitting: Internal Medicine

## 2013-06-03 VITALS — BP 130/84 | HR 64 | Temp 98.1°F | Wt 371.5 lb

## 2013-06-03 DIAGNOSIS — R300 Dysuria: Secondary | ICD-10-CM

## 2013-06-03 DIAGNOSIS — N939 Abnormal uterine and vaginal bleeding, unspecified: Secondary | ICD-10-CM

## 2013-06-03 DIAGNOSIS — N898 Other specified noninflammatory disorders of vagina: Secondary | ICD-10-CM

## 2013-06-03 DIAGNOSIS — N390 Urinary tract infection, site not specified: Secondary | ICD-10-CM

## 2013-06-03 LAB — POCT URINALYSIS DIPSTICK
Glucose, UA: NEGATIVE
KETONES UA: NEGATIVE
Nitrite, UA: POSITIVE
PH UA: 6
SPEC GRAV UA: 1.02
Urobilinogen, UA: 0.2

## 2013-06-03 MED ORDER — FLUCONAZOLE 150 MG PO TABS
150.0000 mg | ORAL_TABLET | Freq: Once | ORAL | Status: DC
Start: 2013-06-03 — End: 2013-09-10

## 2013-06-03 MED ORDER — CIPROFLOXACIN HCL 500 MG PO TABS: 500.0000 mg | ORAL_TABLET | Freq: Two times a day (BID) | ORAL | Status: DC

## 2013-06-03 NOTE — Progress Notes (Signed)
Pre visit review using our clinic review tool, if applicable. No additional management support is needed unless otherwise documented below in the visit note. 

## 2013-06-03 NOTE — Patient Instructions (Signed)
Urinary Tract Infection  Urinary tract infections (UTIs) can develop anywhere along your urinary tract. Your urinary tract is your body's drainage system for removing wastes and extra water. Your urinary tract includes two kidneys, two ureters, a bladder, and a urethra. Your kidneys are a pair of bean-shaped organs. Each kidney is about the size of your fist. They are located below your ribs, one on each side of your spine.  CAUSES  Infections are caused by microbes, which are microscopic organisms, including fungi, viruses, and bacteria. These organisms are so small that they can only be seen through a microscope. Bacteria are the microbes that most commonly cause UTIs.  SYMPTOMS   Symptoms of UTIs may vary by age and gender of the patient and by the location of the infection. Symptoms in young women typically include a frequent and intense urge to urinate and a painful, burning feeling in the bladder or urethra during urination. Older women and men are more likely to be tired, shaky, and weak and have muscle aches and abdominal pain. A fever may mean the infection is in your kidneys. Other symptoms of a kidney infection include pain in your back or sides below the ribs, nausea, and vomiting.  DIAGNOSIS  To diagnose a UTI, your caregiver will ask you about your symptoms. Your caregiver also will ask to provide a urine sample. The urine sample will be tested for bacteria and white blood cells. White blood cells are made by your body to help fight infection.  TREATMENT   Typically, UTIs can be treated with medication. Because most UTIs are caused by a bacterial infection, they usually can be treated with the use of antibiotics. The choice of antibiotic and length of treatment depend on your symptoms and the type of bacteria causing your infection.  HOME CARE INSTRUCTIONS   If you were prescribed antibiotics, take them exactly as your caregiver instructs you. Finish the medication even if you feel better after you  have only taken some of the medication.   Drink enough water and fluids to keep your urine clear or pale yellow.   Avoid caffeine, tea, and carbonated beverages. They tend to irritate your bladder.   Empty your bladder often. Avoid holding urine for long periods of time.   Empty your bladder before and after sexual intercourse.   After a bowel movement, women should cleanse from front to back. Use each tissue only once.  SEEK MEDICAL CARE IF:    You have back pain.   You develop a fever.   Your symptoms do not begin to resolve within 3 days.  SEEK IMMEDIATE MEDICAL CARE IF:    You have severe back pain or lower abdominal pain.   You develop chills.   You have nausea or vomiting.   You have continued burning or discomfort with urination.  MAKE SURE YOU:    Understand these instructions.   Will watch your condition.   Will get help right away if you are not doing well or get worse.  Document Released: 10/11/2004 Document Revised: 07/03/2011 Document Reviewed: 02/09/2011  [employer] Patient Information 2014 [employer].

## 2013-06-03 NOTE — Progress Notes (Signed)
HPI  Pt presents to the clinic today with c/o dysuria, urgency, bladder pressure and lower pelvic pain. She reports this started 1 day ago. She denies fever, chills, nausea, vomiting or low back pain. She does report she has been spotting on and off since her mirena was put in back in 03/2013. She has not taken anything OTC.   Review of Systems  Past Medical History  Diagnosis Date  . Anxiety state, unspecified   . Bipolar disorder, unspecified   . Depressive disorder, not elsewhere classified   . Edema   . Myalgia and myositis, unspecified   . Abdominal pain, other specified site   . Esophageal reflux   . Mild or unspecified pre-eclampsia, unspecified as to episode of care   . Morbid obesity   . Other dyspnea and respiratory abnormality   . Urinary tract infection, site not specified   . History of kidney stones   . Preeclampsia     with first pregnancy    Family History  Problem Relation Age of Onset  . Depression Father   . Alcohol abuse Father   . Hypertension Mother   . Coronary artery disease Maternal Grandmother   . Heart attack Maternal Grandmother   . Diabetes Maternal Grandmother   . Lung cancer Maternal Grandfather   . Emphysema Maternal Grandfather   . Coronary artery disease Maternal Grandfather   . Lupus      Aunt  . Fibromyalgia      Aunt    History   Social History  . Marital Status: Married    Spouse Name: N/A    Number of Children: 1  . Years of Education: N/A   Occupational History  . Home maker    Social History Main Topics  . Smoking status: Never Smoker   . Smokeless tobacco: Never Used  . Alcohol Use: Yes     Comment: occasional < 1 month  . Drug Use: No  . Sexual Activity: Not on file   Other Topics Concern  . Not on file   Social History Narrative   1 child, 2 step-sons      Regular exercise-no   Diet: no fast food, likes sweets    No Known Allergies  Constitutional: Denies fever, malaise, fatigue, headache or abrupt  weight changes.   GU: Pt reports urgency, frequency and pain with urination. Denies burning sensation, blood in urine, odor or discharge. Skin: Denies redness, rashes, lesions or ulcercations.   No other specific complaints in a complete review of systems (except as listed in HPI above).    Objective:   Physical Exam  BP 130/84  Pulse 64  Temp(Src) 98.1 F (36.7 C) (Oral)  Wt 371 lb 8 oz (168.511 kg)  Wt Readings from Last 3 Encounters:  06/03/13 371 lb 8 oz (168.511 kg)  05/22/13 374 lb 8 oz (169.872 kg)  04/21/13 367 lb (166.47 kg)    General: Appears her stated age, well developed, well nourished in NAD. Cardiovascular: Normal rate and rhythm. S1,S2 noted.  No murmur, rubs or gallops noted. No JVD or BLE edema. No carotid bruits noted. Pulmonary/Chest: Normal effort and positive vesicular breath sounds. No respiratory distress. No wheezes, rales or ronchi noted.  Abdomen: Soft and nontender. Normal bowel sounds, no bruits noted. No distention or masses noted. Liver, spleen and kidneys non palpable. Tender to palpation over the bladder area. No CVA tenderness.      Assessment & Plan:   Urgency, Frequency, Dysuria secondary to UTI:  Urinalysis: mod leuks, pos nitrites, large blood Will send for culture eRx sent if for Cipro 500 mg BID x 5 days OK to take AZO OTC Drink plenty of fluids  Spotting secondary to mirena:  Advised her to give it 1 more month If spotting doesn't subside, I would encourage her to follow up with gyn   RTC as needed or if symptoms persist.

## 2013-06-05 ENCOUNTER — Encounter: Payer: Self-pay | Admitting: Family Medicine

## 2013-06-05 ENCOUNTER — Ambulatory Visit (INDEPENDENT_AMBULATORY_CARE_PROVIDER_SITE_OTHER): Admitting: Family Medicine

## 2013-06-05 VITALS — BP 136/83 | HR 78 | Temp 99.0°F | Ht 62.75 in | Wt 374.8 lb

## 2013-06-05 DIAGNOSIS — I10 Essential (primary) hypertension: Secondary | ICD-10-CM

## 2013-06-05 DIAGNOSIS — IMO0001 Reserved for inherently not codable concepts without codable children: Secondary | ICD-10-CM

## 2013-06-05 DIAGNOSIS — M79609 Pain in unspecified limb: Secondary | ICD-10-CM

## 2013-06-05 DIAGNOSIS — M79605 Pain in left leg: Secondary | ICD-10-CM

## 2013-06-05 DIAGNOSIS — M79604 Pain in right leg: Secondary | ICD-10-CM

## 2013-06-05 NOTE — Patient Instructions (Addendum)
Titrate up gabapentin n over 1-2 weeks increase to 100 mg three times a day as tolerated. Try to decrease and limit tramadol as able. 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 Review information on blood pressure control diet. Get BP cuff and follow BP at home. Schedule follow up in 1 month ,c all sooner if BP very elevated.   Cardiac Diet This diet can help prevent heart disease and stroke. Many factors influence your heart health, including eating and exercise habits. Coronary risk rises a lot with abnormal blood fat (lipid) levels. Cardiac meal planning includes limiting unhealthy fats, increasing healthy fats, and making other small dietary changes. General guidelines are as follows:  Adjust calorie intake to reach and maintain desirable body weight.  Limit total fat intake to less than 30% of total calories. Saturated fat should be less than 7% of calories.  Saturated fats are found in animal products and in some vegetable products. Saturated vegetable fats are found in coconut oil, cocoa butter, palm oil, and palm kernel oil. Read labels carefully to avoid these products as much as possible. Use butter in moderation. Choose tub margarines and oils that have 2 grams of fat or less. Good cooking oils are canola and olive oils.  Practice low-fat cooking techniques. Do not fry food. Instead, broil, bake, boil, steam, grill, roast on a rack, stir-fry, or microwave it. Other fat reducing suggestions include:  Remove the skin from poultry.  Remove all visible fat from meats.  Skim the fat off stews, soups, and gravies before serving them.  Steam vegetables in water or broth instead of sauting them in fat.  Avoid foods with trans fat (or hydrogenated oils), such as commercially fried foods and commercially baked goods. Commercial shortening and deep-frying fats will contain trans fat.  Increase intake of fruits, vegetables, whole grains, and legumes to replace foods high in  fat.  Increase consumption of nuts, legumes, and seeds to at least 4 servings weekly. One serving of a legume equals  cup, and 1 serving of nuts or seeds equals  cup.  Choose whole grains more often. Have 3 servings per day (a serving is 1 ounce [oz]).  Eat 4 to 5 servings of vegetables per day. A serving of vegetables is 1 cup of raw leafy vegetables;  cup of raw or cooked cut-up vegetables;  cup of vegetable juice.  Eat 4 to 5 servings of fruit per day. A serving of fruit is 1 medium whole fruit;  cup of dried fruit;  cup of fresh, frozen, or canned fruit;  cup of 100% fruit juice.  Increase your intake of dietary fiber to 20 to 30 grams per day. Insoluble fiber may help lower your risk of heart disease and may help curb your appetite.  Soluble fiber binds cholesterol to be removed from the blood. Foods high in soluble fiber are dried beans, citrus fruits, oats, apples, bananas, broccoli, Brussels sprouts, and eggplant.  Try to include foods fortified with plant sterols or stanols, such as yogurt, breads, juices, or margarines. Choose several fortified foods to achieve a daily intake of 2 to 3 grams of plant sterols or stanols.  Foods with omega-3 fats can help reduce your risk of heart disease. Aim to have a 3.5 oz portion of fatty fish twice per week, such as salmon, mackerel, albacore tuna, sardines, lake trout, or herring. If you wish to take a fish oil supplement, choose one that contains 1 gram of both DHA and EPA.  Limit processed  meats to 2 servings (3 oz portion) weekly.  Limit the sodium in your diet to 1500 milligrams (mg) per day. If you have high blood pressure, talk to a registered dietitian about a DASH (Dietary Approaches to Stop Hypertension) eating plan.  Limit sweets and beverages with added sugar, such as soda, to no more than 5 servings per week. One serving is:   1 tablespoon sugar.  1 tablespoon jelly or jam.   cup sorbet.  1 cup lemonade.   cup  regular soda. CHOOSING FOODS Starches  Allowed: Breads: All kinds (wheat, rye, raisin, white, oatmeal, [place], [place], and English muffin bread). Low-fat rolls: English muffins, frankfurter and hamburger buns, bagels, pita bread, tortillas (not fried). Pancakes, waffles, biscuits, and muffins made with recommended oil.  Avoid: Products made with saturated or trans fats, oils, or whole milk products. Butter rolls, cheese breads, croissants. Commercial doughnuts, muffins, sweet rolls, biscuits, waffles, pancakes, store-bought mixes. Crackers  Allowed: Low-fat crackers and snacks: Animal, graham, rye, saltine (with recommended oil, no lard), oyster, and matzo crackers. Bread sticks, melba toast, rusks, flatbread, pretzels, and light popcorn.  Avoid: High-fat crackers: cheese crackers, butter crackers, and those made with coconut, palm oil, or trans fat (hydrogenated oils). Buttered popcorn. Cereals  Allowed: Hot or cold whole-grain cereals.  Avoid: Cereals containing coconut, hydrogenated vegetable fat, or animal fat. Potatoes / Pasta / Rice  Allowed: All kinds of potatoes, rice, and pasta (such as macaroni, spaghetti, and noodles).  Avoid: Pasta or rice prepared with cream sauce or high-fat cheese. Chow mein noodles, [place] fries. Vegetables  Allowed: All vegetables and vegetable juices.  Avoid: Fried vegetables. Vegetables in cream, butter, or high-fat cheese sauces. Limit coconut. Fruit in cream or custard. Protein  Allowed: Limit your intake of meat, seafood, and poultry to no more than 6 oz (cooked weight) per day. All lean, well-trimmed beef, veal, pork, and lamb. All chicken and [place] without skin. All fish and shellfish. Wild game: wild duck, rabbit, pheasant, and venison. Egg whites or low-cholesterol egg substitutes may be used as desired. Meatless dishes: recipes with dried beans, peas, lentils, and tofu (soybean curd). Seeds and nuts: all seeds and most nuts.  Avoid: Prime  grade and other heavily marbled and fatty meats, such as short ribs, spare ribs, rib eye roast or steak, frankfurters, sausage, bacon, and high-fat luncheon meats, mutton. Caviar. Commercially fried fish. Domestic duck, goose, venison sausage. Organ meats: liver, gizzard, heart, chitterlings, brains, kidney, sweetbreads. Dairy  Allowed: Low-fat cheeses: nonfat or low-fat cottage cheese (1% or 2% fat), cheeses made with part skim milk, such as mozzarella, farmers, string, or ricotta. (Cheeses should be labeled no more than 2 to 6 grams fat per oz.). Skim (or 1%) milk: liquid, powdered, or evaporated. Buttermilk made with low-fat milk. Drinks made with skim or low-fat milk or cocoa. Chocolate milk or cocoa made with skim or low-fat (1%) milk. Nonfat or low-fat yogurt.  Avoid: Whole milk cheeses, including colby, cheddar, [place], Monterey Jack, [place], [place], [place], American, Swiss, and blue. Creamed cottage cheese, cream cheese. Whole milk and whole milk products, including buttermilk or yogurt made from whole milk, drinks made from whole milk. Condensed milk, evaporated whole milk, and 2% milk. Soups and Combination Foods  Allowed: Low-fat low-sodium soups: broth, dehydrated soups, homemade broth, soups with the fat removed, homemade cream soups made with skim or low-fat milk. Low-fat spaghetti, lasagna, chili, and Spanish rice if low-fat ingredients and low-fat cooking techniques are used.  Avoid: Cream soups made with  whole milk, cream, or high-fat cheese. All other soups. Desserts and Sweets  Allowed: Sherbet, fruit ices, gelatins, meringues, and angel food cake. Homemade desserts with recommended fats, oils, and milk products. Jam, jelly, honey, marmalade, sugars, and syrups. Pure sugar candy, such as gum drops, hard candy, jelly beans, marshmallows, mints, and small amounts of dark chocolate.  Avoid: Commercially prepared cakes, pies, cookies, frosting, pudding, or mixes for these products.  Desserts containing whole milk products, chocolate, coconut, lard, palm oil, or palm kernel oil. Ice cream or ice cream drinks. Candy that contains chocolate, coconut, butter, hydrogenated fat, or unknown ingredients. Buttered syrups. Fats and Oils  Allowed: Vegetable oils: safflower, sunflower, corn, soybean, cottonseed, sesame, canola, olive, or peanut. Non-hydrogenated margarines. Salad dressing or mayonnaise: homemade or commercial, made with a recommended oil. Low or nonfat salad dressing or mayonnaise.  Limit added fats and oils to 6 to 8 tsp per day (includes fats used in cooking, baking, salads, and spreads on bread). Remember to count the "hidden fats" in foods.  Avoid: Solid fats and shortenings: butter, lard, salt pork, bacon drippings. Gravy containing meat fat, shortening, or suet. Cocoa butter, coconut. Coconut oil, palm oil, palm kernel oil, or hydrogenated oils: these ingredients are often used in bakery products, nondairy creamers, whipped toppings, candy, and commercially fried foods. Read labels carefully. Salad dressings made of unknown oils, sour cream, or cheese, such as blue cheese and Roquefort. Cream, all kinds: half-and-half, light, heavy, or whipping. Sour cream or cream cheese (even if "light" or low-fat). Nondairy cream substitutes: coffee creamers and sour cream substitutes made with palm, palm kernel, hydrogenated oils, or coconut oil. Beverages  Allowed: Coffee (regular or decaffeinated), tea. Diet carbonated beverages, mineral water. Alcohol: Check with your caregiver. Moderation is recommended.  Avoid: Whole milk, regular sodas, and juice drinks with added sugar. Condiments  Allowed: All seasonings and condiments. Cocoa powder. "Cream" sauces made with recommended ingredients.  Avoid: Carob powder made with hydrogenated fats. SAMPLE MENU Breakfast   cup orange juice   cup oatmeal  1 slice toast  1 tsp margarine  1 cup skim milk Lunch  [place]  sandwich with 2 oz [place], 2 slices bread  Lettuce and tomato slices  Fresh fruit  Carrot sticks  Coffee or tea Snack  Fresh fruit or low-fat crackers Dinner  3 oz lean ground beef  1 baked potato  1 tsp margarine   cup asparagus  Lettuce salad  1 tbs non-creamy dressing   cup peach slices  1 cup skim milk Document Released: 10/11/2007 Document Revised: 07/03/2011 Document Reviewed: 03/27/2011 Exit[employer] Patient Information 2014 [place], [place].

## 2013-06-05 NOTE — Progress Notes (Signed)
   Subjective:    Patient ID: Sierra [name], female    DOB: 14-May-1980, 33 y.o.   MRN: [id number]  HPI  33 year old female presents for 2 week follow up leg pain bilaterally. At last OV:   Most likely secondary to fibromyalgia, but pain in legs has some red flags for spinal stenosis.  Will start treatment with Neurontin and tramadol.  Stop NSAID as not beneficial.  Consider eval of spine for stenosis if not improving as expected.    Since last OV: She has been on cipro for UTI.. Dx  On 5/20 by Ovidio Hanger. She has had vaginal spotting with new mirena IUD.  She reports  She has had 65-75 % improvement in leg pain. Using tramadol twice a day.  She is taking neurontin 300 mg at bedtime now in last week. Occ using tylenol for pain. She feels that she has good strength in legs now that pain is better. She has been able to be more active with chores.    She has noted her BP has been elevated in last several month. She has never been on a med before.  She has strong family history of HTN. BP Readings from Last 3 Encounters:  06/05/13 167/91  06/03/13 130/84  05/22/13 143/73  TSH, renal and liver function nml 09/2013 No chest pain, no SOB.   Wt Readings from Last 3 Encounters:  06/05/13 374 lb 12 oz (169.985 kg)  06/03/13 371 lb 8 oz (168.511 kg)  05/22/13 374 lb 8 oz (169.872 kg)       Review of Systems  Constitutional: Negative for fever and fatigue.  HENT: Negative for ear pain.   Eyes: Negative for pain.  Respiratory: Negative for chest tightness and shortness of breath.   Cardiovascular: Negative for chest pain, palpitations and leg swelling.  Gastrointestinal: Negative for abdominal pain.  Genitourinary: Negative for dysuria.       Objective:   Physical Exam  Constitutional:  Morbidly obese  HENT:  Head: Normocephalic.  Eyes: Conjunctivae are normal. Pupils are equal, round, and reactive to light.  Neck: Normal range of motion. Neck supple. No thyromegaly  present.  Cardiovascular: Normal rate.   No murmur heard. Pulmonary/Chest: Effort normal and breath sounds normal. No respiratory distress. She has no wheezes.  Abdominal: Soft. Bowel sounds are normal.  Musculoskeletal:       Right hip: She exhibits normal range of motion and no tenderness.       Left hip: She exhibits normal range of motion and no tenderness.       Right knee: She exhibits normal range of motion. No tenderness found. No medial joint line, no lateral joint line, no MCL and no LCL tenderness noted.       Lumbar back: She exhibits normal range of motion, no tenderness and no bony tenderness.  Less tender trigger points  Neurological: She has normal strength. No cranial nerve deficit or sensory deficit. Gait normal.           Assessment & Plan:

## 2013-06-05 NOTE — Assessment & Plan Note (Signed)
Will have her work on lifestyle changes.  Follow BP at home.  If stiull elevated at next OV consider work up and new medication.

## 2013-06-05 NOTE — Progress Notes (Signed)
Pre visit review using our clinic review tool, if applicable. No additional management support is needed unless otherwise documented below in the visit note. 

## 2013-06-05 NOTE — Assessment & Plan Note (Signed)
Improved with  neurontin and tramadol. Titrate up neurontin as able, limit tramadol.

## 2013-06-05 NOTE — Assessment & Plan Note (Signed)
Likely cause of pain in legs and body. IMproved significantly on tramadol and neurontin.

## 2013-06-06 LAB — URINE CULTURE: Colony Count: >=100000

## 2013-06-09 ENCOUNTER — Telehealth: Payer: Self-pay | Admitting: Family Medicine

## 2013-06-09 NOTE — Telephone Encounter (Signed)
Relevant patient education assigned to patient using Emmi. ° °

## 2013-06-15 ENCOUNTER — Telehealth: Payer: Self-pay

## 2013-06-15 ENCOUNTER — Ambulatory Visit: Payer: Self-pay | Admitting: Family Medicine

## 2013-06-15 NOTE — Telephone Encounter (Addendum)
Called to verify current gabapentin dose.  Sierra [name] states she is currently taking 600 mg at bedtime which she is tolerating well.  Wanting to know if there is a different strength of gabapentin other than the 100 mg that could be prescribed so she doesn't have to take six tablets at a time.  Would like a 30 day prescription sent to  [facility] [address] and a 90 day supply printed so she can pick up and send in to her mail order.  As far as her smelling smoke, headaches and blurred vision, I would have to wait until Dr. Ermalene Searing in back in clinic to address those issues.  I advised [name] I would call her back tomorrow once Dr. Ermalene Searing is back in the office.

## 2013-06-15 NOTE — Telephone Encounter (Signed)
Pt left v/m; pt is presently taking gabapentin 600 mg total daily(pt taking 600 mg at bedtime). Pt request refill to [facility]. Pt also request printed maintenance rx (90 day rx).pt will pick up and send to mail order pharmacy. Call pt when ready for pick up.  Pt also wanted to know if gabapentin could cause pt to smell smoke or have h/a's. Pt said she smells smoke all the time and not around anyone that smokes and pt has started having h/a's couple of times a week. No dizziness, CP or SOB. Pt having blurred vision; pt recently got new glasses but that has not helped blurred vision.Pt request cb.

## 2013-06-16 MED ORDER — GABAPENTIN 300 MG PO CAPS: 600.0000 mg | ORAL_CAPSULE | Freq: Every day | ORAL | Status: DC

## 2013-06-16 NOTE — Telephone Encounter (Signed)
Kalena notified as instructed by telephone.  Prescription for Gabapentin 300 mg take two at bedtime sent in to [facility] for a 30 day supply and a 90 day supply prescription printed and is ready for pick up at the front desk.

## 2013-06-16 NOTE — Telephone Encounter (Signed)
Possibly could cause headaches, not issues with smelling of smoke. Given benefits have been so great I would continue despite   HA as they may improve with time as she gets used to the medication.

## 2013-07-07 ENCOUNTER — Ambulatory Visit: Admitting: Family Medicine

## 2013-07-15 ENCOUNTER — Ambulatory Visit: Payer: Self-pay | Admitting: Family Medicine

## 2013-09-10 ENCOUNTER — Encounter: Payer: Self-pay | Admitting: Family Medicine

## 2013-09-10 ENCOUNTER — Ambulatory Visit (INDEPENDENT_AMBULATORY_CARE_PROVIDER_SITE_OTHER): Admitting: Family Medicine

## 2013-09-10 ENCOUNTER — Encounter: Payer: Self-pay | Admitting: *Deleted

## 2013-09-10 VITALS — BP 163/82 | HR 93 | Temp 98.2°F | Ht 62.75 in | Wt 387.5 lb

## 2013-09-10 DIAGNOSIS — R0609 Other forms of dyspnea: Secondary | ICD-10-CM

## 2013-09-10 DIAGNOSIS — M79604 Pain in right leg: Secondary | ICD-10-CM

## 2013-09-10 DIAGNOSIS — IMO0001 Reserved for inherently not codable concepts without codable children: Secondary | ICD-10-CM

## 2013-09-10 DIAGNOSIS — M79605 Pain in left leg: Principal | ICD-10-CM

## 2013-09-10 DIAGNOSIS — I10 Essential (primary) hypertension: Secondary | ICD-10-CM

## 2013-09-10 DIAGNOSIS — M79609 Pain in unspecified limb: Secondary | ICD-10-CM

## 2013-09-10 DIAGNOSIS — R0989 Other specified symptoms and signs involving the circulatory and respiratory systems: Secondary | ICD-10-CM

## 2013-09-10 LAB — POCT URINALYSIS DIPSTICK
BILIRUBIN UA: NEGATIVE
Glucose, UA: NEGATIVE
Ketones, UA: NEGATIVE
Leukocytes, UA: NEGATIVE
NITRITE UA: NEGATIVE
PH UA: 6
Spec Grav, UA: >=1.03
Urobilinogen, UA: 0.2

## 2013-09-10 MED ORDER — TRAMADOL HCL 50 MG PO TABS: 50.0000 mg | ORAL_TABLET | Freq: Two times a day (BID) | ORAL | Status: DC

## 2013-09-10 MED ORDER — HYDROCHLOROTHIAZIDE 25 MG PO TABS: 25.0000 mg | ORAL_TABLET | Freq: Every day | ORAL | Status: DC

## 2013-09-10 NOTE — Progress Notes (Signed)
Pre visit review using our clinic review tool, if applicable. No additional management support is needed unless otherwise documented below in the visit note. 

## 2013-09-10 NOTE — Assessment & Plan Note (Signed)
New dx . Send for lab eval for secondary cause, end organ damage and risk factors.  EKG and UA as well. Start HCTZ, follow BP at home. Follow up in 2-3 weeks.

## 2013-09-10 NOTE — Progress Notes (Signed)
Subjective:    Patient ID: Sierra [name], female    DOB: 05-29-1980, 33 y.o.   MRN: [id number]  HPI  33 year old female with morbid obesity present for follow up.   B leg pain likely secondary to fibromyalgia ( some concern for spinal stenosis . (She has not had MRI back as of yet). She is on tramadol and gabapentin 600 mg at bedtime. She has no current SE or sedation with theses meds. She is not using tramadol because ran out. She would like a refill. These helped her a lot initially but today she reports   Pain in B legs has slowly worsened over the past month. No weakness and numbness in legs.   New dx Hypertension: BP is elevated today again. She has never been on a med before. She has strong family history of HTN. BP Readings from Last 3 Encounters:  09/10/13 163/82  06/05/13 136/83  06/03/13 130/84  Using medication without problems or lightheadedness: None Chest pain with exertion: None Edema:none Short of breath: yes with exertion Average home BPs: not checking Other issues:    Review of Systems  Constitutional: Negative for fever and fatigue.  HENT: Negative for ear pain.   Eyes: Negative for pain.  Respiratory: Positive for shortness of breath. Negative for chest tightness.   Cardiovascular: Negative for chest pain, palpitations and leg swelling.  Gastrointestinal: Negative for abdominal pain.  Genitourinary: Negative for dysuria.       Objective:   Physical Exam  Constitutional: Vital signs are normal. She appears well-developed and well-nourished. She is cooperative.  Non-toxic appearance. She does not appear ill. No distress.  Morbid obesity.  HENT:  Head: Normocephalic.  Right Ear: Hearing, tympanic membrane, external ear and ear canal normal. Tympanic membrane is not erythematous, not retracted and not bulging.  Left Ear: Hearing, tympanic membrane, external ear and ear canal normal. Tympanic membrane is not erythematous, not retracted and not bulging.    Nose: No mucosal edema or rhinorrhea. Right sinus exhibits no maxillary sinus tenderness and no frontal sinus tenderness. Left sinus exhibits no maxillary sinus tenderness and no frontal sinus tenderness.  Mouth/Throat: Uvula is midline, oropharynx is clear and moist and mucous membranes are normal.  Eyes: Conjunctivae, EOM and lids are normal. Pupils are equal, round, and reactive to light. Lids are everted and swept, no foreign bodies found.  Neck: Trachea normal and normal range of motion. Neck supple. Carotid bruit is not present. No mass and no thyromegaly present.  Cardiovascular: Normal rate, regular rhythm, S1 normal, S2 normal, normal heart sounds, intact distal pulses and normal pulses.  Exam reveals no gallop and no friction rub.   No murmur heard. Pulmonary/Chest: Effort normal and breath sounds normal. Not tachypneic. No respiratory distress. She has no decreased breath sounds. She has no wheezes. She has no rhonchi. She has no rales.  Abdominal: Soft. Normal appearance and bowel sounds are normal. There is no tenderness.  Musculoskeletal:       Right hip: She exhibits normal range of motion and no tenderness.       Left hip: She exhibits normal range of motion and no tenderness.       Right knee: She exhibits normal range of motion. Tenderness found. Medial joint line and lateral joint line tenderness noted. No MCL and no LCL tenderness noted.       Lumbar back: She exhibits tenderness. She exhibits normal range of motion and no bony tenderness.  10 fibromyalgia trigger points  very sensitive. Neg SLR, neg Faber's  Exam is limited by morbid obesity.  Neurological: She is alert. She has normal strength. No cranial nerve deficit or sensory deficit. Gait normal.  Skin: Skin is warm, dry and intact. No rash noted.  Psychiatric: Her speech is normal and behavior is normal. Judgment and thought content normal. Her mood appears not anxious. Cognition and memory are normal. She does not  exhibit a depressed mood.          Assessment & Plan:

## 2013-09-10 NOTE — Assessment & Plan Note (Signed)
Likely due to fibromyalgia. No further eval at this time given previous improvement with gabapentin.  Increase gabapentin to 300 mg Am , and midday in addition to 600 mg at PM. Refill tramadol. Send for UDS today.

## 2013-09-10 NOTE — Patient Instructions (Addendum)
Increase  Gabapentin to 300 mg in AM and 600 mg at PM . If no SE but not enough improvement can take an additional 300 mg in mid day. Use tramadol as needed for pain. Stop at lab on way out UDS. Get a BP cuff, follow BP daily. Record measurements. Start  HCTZ daily. Work on [employer] and regular exercise. Return in few days for fasting labs. Follow up on 3 weeks HTN and leg pain.

## 2013-09-10 NOTE — Assessment & Plan Note (Signed)
If sleep study not done recently... consider further eval given possible cause of HTN. Discuss at next OV.

## 2013-09-11 ENCOUNTER — Other Ambulatory Visit (INDEPENDENT_AMBULATORY_CARE_PROVIDER_SITE_OTHER)

## 2013-09-11 DIAGNOSIS — Z79899 Other long term (current) drug therapy: Secondary | ICD-10-CM

## 2013-09-11 DIAGNOSIS — I10 Essential (primary) hypertension: Secondary | ICD-10-CM

## 2013-09-11 LAB — CBC WITH DIFFERENTIAL/PLATELET
BASOS ABS: 0 10*3/uL (ref 0.0–0.1)
Basophils Relative: 0.8 % (ref 0.0–3.0)
Eosinophils Absolute: 0.1 10*3/uL (ref 0.0–0.7)
Eosinophils Relative: 2.1 % (ref 0.0–5.0)
HCT: 37 % (ref 36.0–46.0)
HEMOGLOBIN: 12.4 g/dL (ref 12.0–15.0)
LYMPHS PCT: 25.5 % (ref 12.0–46.0)
Lymphs Abs: 1.4 10*3/uL (ref 0.7–4.0)
MCHC: 33.7 g/dL (ref 30.0–36.0)
MCV: 87.4 fl (ref 78.0–100.0)
MONOS PCT: 6.7 % (ref 3.0–12.0)
Monocytes Absolute: 0.4 10*3/uL (ref 0.1–1.0)
Neutro Abs: 3.6 10*3/uL (ref 1.4–7.7)
Neutrophils Relative %: 64.9 % (ref 43.0–77.0)
Platelets: 299 10*3/uL (ref 150.0–400.0)
RBC: 4.23 Mil/uL (ref 3.87–5.11)
RDW: 13.7 % (ref 11.5–15.5)
WBC: 5.5 10*3/uL (ref 4.0–10.5)

## 2013-09-11 LAB — COMPREHENSIVE METABOLIC PANEL
ALT: 16 U/L (ref 0–35)
AST: 19 U/L (ref 0–37)
Albumin: 3.4 g/dL — ABNORMAL LOW (ref 3.5–5.2)
Alkaline Phosphatase: 75 U/L (ref 39–117)
BUN: 10 mg/dL (ref 6–23)
CALCIUM: 9.2 mg/dL (ref 8.4–10.5)
CHLORIDE: 109 meq/L (ref 96–112)
CO2: 27 mEq/L (ref 19–32)
Creatinine, Ser: 1 mg/dL (ref 0.4–1.2)
GFR: 72.08 mL/min (ref >60.00)
GLUCOSE: 96 mg/dL (ref 70–99)
Potassium: 4.2 mEq/L (ref 3.5–5.1)
Sodium: 140 mEq/L (ref 135–145)
TOTAL PROTEIN: 7 g/dL (ref 6.0–8.3)
Total Bilirubin: 0.6 mg/dL (ref 0.2–1.2)

## 2013-09-11 LAB — LIPID PANEL
CHOL/HDL RATIO: 4
Cholesterol: 147 mg/dL (ref 0–200)
HDL: 35.1 mg/dL — ABNORMAL LOW (ref >39.00)
LDL Cholesterol: 99 mg/dL (ref 0–99)
NONHDL: 111.9
Triglycerides: 65 mg/dL (ref 0.0–149.0)
VLDL: 13 mg/dL (ref 0.0–40.0)

## 2013-09-11 LAB — HEMOGLOBIN A1C: HEMOGLOBIN A1C: 4.8 % (ref 4.6–6.5)

## 2013-09-11 LAB — TSH: TSH: 0.41 u[IU]/mL (ref 0.35–4.50)

## 2013-09-18 ENCOUNTER — Ambulatory Visit (INDEPENDENT_AMBULATORY_CARE_PROVIDER_SITE_OTHER): Admitting: Family Medicine

## 2013-09-18 ENCOUNTER — Encounter: Payer: Self-pay | Admitting: Family Medicine

## 2013-09-18 VITALS — BP 126/86 | HR 97 | Temp 98.2°F | Wt 389.5 lb

## 2013-09-18 DIAGNOSIS — L03012 Cellulitis of left finger: Secondary | ICD-10-CM

## 2013-09-18 DIAGNOSIS — IMO0002 Reserved for concepts with insufficient information to code with codable children: Secondary | ICD-10-CM

## 2013-09-18 MED ORDER — CEPHALEXIN 500 MG PO CAPS: 500.0000 mg | ORAL_CAPSULE | Freq: Four times a day (QID) | ORAL | Status: DC

## 2013-09-18 NOTE — Progress Notes (Signed)
Pre visit review using our clinic review tool, if applicable. No additional management support is needed unless otherwise documented below in the visit note.  L medial 1st nail with prev irritation and some pus drainage prev.  Slightly sore.  No spreading erythema, no other erythema.  No fevers.  No trauma.    Meds, vitals, and allergies reviewed.   ROS: See HPI.  Otherwise, noncontributory.  Obese L foot with skin at L medial 1st nail irritated, no fluctuant mass. Slightly ttp.  Part of the leading nail edge medially is minimally ingrown, but this is a small part of the distal nail edge, it is mainly not ingrown.  NV inctact.

## 2013-09-18 NOTE — Patient Instructions (Signed)
Take the antibiotics and soak your foot several times a day.  After soaking, gently try to raise the end of the nail.  Take care.

## 2013-09-21 DIAGNOSIS — IMO0002 Reserved for concepts with insufficient information to code with codable children: Secondary | ICD-10-CM | POA: Insufficient documentation

## 2013-09-21 NOTE — Assessment & Plan Note (Signed)
Keflex, soak the foot several times a day.  After soaking, gently try to raise the end of the nail.  D/w pt.  No need for resection now.   F/u prn.

## 2013-09-29 ENCOUNTER — Ambulatory Visit: Payer: Self-pay | Admitting: Family Medicine

## 2013-10-02 ENCOUNTER — Ambulatory Visit (INDEPENDENT_AMBULATORY_CARE_PROVIDER_SITE_OTHER): Admitting: Family Medicine

## 2013-10-02 ENCOUNTER — Encounter: Payer: Self-pay | Admitting: Family Medicine

## 2013-10-02 VITALS — BP 165/92 | HR 89 | Temp 98.3°F | Ht 62.75 in | Wt 387.5 lb

## 2013-10-02 DIAGNOSIS — M79605 Pain in left leg: Principal | ICD-10-CM

## 2013-10-02 DIAGNOSIS — Z23 Encounter for immunization: Secondary | ICD-10-CM

## 2013-10-02 DIAGNOSIS — I10 Essential (primary) hypertension: Secondary | ICD-10-CM

## 2013-10-02 DIAGNOSIS — IMO0001 Reserved for inherently not codable concepts without codable children: Secondary | ICD-10-CM

## 2013-10-02 DIAGNOSIS — M79609 Pain in unspecified limb: Secondary | ICD-10-CM

## 2013-10-02 DIAGNOSIS — M79604 Pain in right leg: Secondary | ICD-10-CM

## 2013-10-02 NOTE — Assessment & Plan Note (Signed)
Improved control on neurontin.

## 2013-10-02 NOTE — Addendum Note (Signed)
Addended by: Damita Lack on: 10/02/2013 09:37 AM   Modules accepted: Orders

## 2013-10-02 NOTE — Assessment & Plan Note (Signed)
Improved control on neurontin. Able to decrease use of tramadol.

## 2013-10-02 NOTE — Assessment & Plan Note (Signed)
Inadequate control here today, but excellent control on 9/4. WIll have her get a cuff and follow at home. IF not at goal < 150/90 will add a medication to treat. Encouraged exercise, weight loss, healthy eating habits.

## 2013-10-02 NOTE — Patient Instructions (Signed)
Continue neurotin as taking now. Follow BP at home. Call/ Mychart with measurements in 1-2 weeks. Work on [employer], weight loss and exercise. Follow up HTN in 3 months.

## 2013-10-02 NOTE — Progress Notes (Signed)
Pre visit review using our clinic review tool, if applicable. No additional management support is needed unless otherwise documented below in the visit note. 

## 2013-10-02 NOTE — Progress Notes (Signed)
Subjective:    Patient ID: Sierra [name], female    DOB: 1981-01-07, 33 y.o.   MRN: [id number]  HPI 33 year old morbidly obese female presents for f3 wek follow up on bilateral leg pain and fibromyalgia. Now on neurontin 600/600 She has noted significant improvement. Not having daily pain. She has not had any SE. Using tramadol for pain prn. Only using 2-3 times a week.   Hypertension:  Inadequate control on HCTZ alone. She has been under a lot of stress in last week. Her BP was well controlled on 9/4 OV. BP Readings from Last 3 Encounters:  10/02/13 165/92  09/18/13 126/86  09/10/13 163/82  Using medication without problems or lightheadedness: None Chest pain with exertion:None Edema:None Short of breath:NOne Average home BPs:Not checking at home Other issues:  Wt Readings from Last 3 Encounters:  10/02/13 387 lb 8 oz (175.769 kg)  09/18/13 389 lb 8 oz (176.676 kg)  09/10/13 387 lb 8 oz (175.769 kg)      Review of Systems  Constitutional: Negative for fever and fatigue.  HENT: Negative for ear pain.   Eyes: Negative for pain.  Respiratory: Negative for chest tightness and shortness of breath.   Cardiovascular: Negative for chest pain, palpitations and leg swelling.  Gastrointestinal: Negative for abdominal pain.  Genitourinary: Negative for dysuria.       Objective:   Physical Exam  Constitutional: Vital signs are normal. She appears well-developed and well-nourished. She is cooperative.  Non-toxic appearance. She does not appear ill. No distress.  Morbid obesity.  HENT:  Head: Normocephalic.  Right Ear: Hearing, tympanic membrane, external ear and ear canal normal. Tympanic membrane is not erythematous, not retracted and not bulging.  Left Ear: Hearing, tympanic membrane, external ear and ear canal normal. Tympanic membrane is not erythematous, not retracted and not bulging.  Nose: No mucosal edema or rhinorrhea. Right sinus exhibits no maxillary sinus  tenderness and no frontal sinus tenderness. Left sinus exhibits no maxillary sinus tenderness and no frontal sinus tenderness.  Mouth/Throat: Uvula is midline, oropharynx is clear and moist and mucous membranes are normal.  Eyes: Conjunctivae, EOM and lids are normal. Pupils are equal, round, and reactive to light. Lids are everted and swept, no foreign bodies found.  Neck: Trachea normal and normal range of motion. Neck supple. Carotid bruit is not present. No mass and no thyromegaly present.  Cardiovascular: Normal rate, regular rhythm, S1 normal, S2 normal, normal heart sounds, intact distal pulses and normal pulses.  Exam reveals no gallop and no friction rub.   No murmur heard. Pulmonary/Chest: Effort normal and breath sounds normal. Not tachypneic. No respiratory distress. She has no decreased breath sounds. She has no wheezes. She has no rhonchi. She has no rales.  Abdominal: Soft. Normal appearance and bowel sounds are normal. There is no tenderness.  Musculoskeletal:       Right hip: She exhibits normal range of motion and no tenderness.       Left hip: She exhibits normal range of motion and no tenderness.       Right knee: She exhibits normal range of motion. Tenderness found. Medial joint line and lateral joint line tenderness noted. No MCL and no LCL tenderness noted.       Lumbar back: She exhibits tenderness. She exhibits normal range of motion and no bony tenderness.  10 fibromyalgia trigger points very sensitive. Neg SLR, neg Faber's  Exam is limited by morbid obesity.  Neurological: She is alert. She has  normal strength. No cranial nerve deficit or sensory deficit. Gait normal.  Skin: Skin is warm, dry and intact. No rash noted.  Psychiatric: Her speech is normal and behavior is normal. Judgment and thought content normal. Her mood appears not anxious. Cognition and memory are normal. She does not exhibit a depressed mood.          Assessment & Plan:

## 2013-10-02 NOTE — Assessment & Plan Note (Signed)
Encouraged exercise, weight loss, healthy eating habits. ? ?

## 2013-10-07 ENCOUNTER — Other Ambulatory Visit: Payer: Self-pay

## 2013-10-07 NOTE — Telephone Encounter (Signed)
Last office visit 10/02/2013.  Ok to refill?

## 2013-10-07 NOTE — Telephone Encounter (Signed)
Pt left v/m requesting refill gabapentin to [facility]; pt request 600 mg tablet rather than 300 mg tab taking 2 caps twice a day.pt request cb.

## 2013-10-08 MED ORDER — GABAPENTIN 600 MG PO TABS
600.0000 mg | ORAL_TABLET | Freq: Two times a day (BID) | ORAL | Status: DC
Start: 2013-10-08 — End: 2014-03-23

## 2013-10-08 MED ORDER — GABAPENTIN 600 MG PO TABS
600.0000 mg | ORAL_TABLET | Freq: Two times a day (BID) | ORAL | Status: DC
Start: 2013-10-08 — End: 2013-10-08

## 2013-10-08 NOTE — Telephone Encounter (Signed)
Prescription did not go thru electronically.  Called [facility] and they ask that we fax the prescription.  Rx faxed to [phone].

## 2013-10-08 NOTE — Addendum Note (Signed)
Addended by: Damita Lack on: 10/08/2013 12:56 PM   Modules accepted: Orders

## 2013-10-08 NOTE — Telephone Encounter (Signed)
Left message for Sierra [name] that prescription has been sent to her pharmacy as requested.

## 2013-10-08 NOTE — Telephone Encounter (Signed)
Okay to refill as requested.

## 2013-10-15 ENCOUNTER — Ambulatory Visit: Payer: Self-pay | Admitting: Family Medicine

## 2013-11-15 ENCOUNTER — Ambulatory Visit: Payer: Self-pay | Admitting: Family Medicine

## 2013-12-31 ENCOUNTER — Encounter: Payer: Self-pay | Admitting: Family Medicine

## 2013-12-31 ENCOUNTER — Ambulatory Visit (INDEPENDENT_AMBULATORY_CARE_PROVIDER_SITE_OTHER): Admitting: Family Medicine

## 2013-12-31 VITALS — BP 119/77 | HR 86 | Temp 98.4°F | Ht 62.75 in | Wt 392.2 lb

## 2013-12-31 DIAGNOSIS — I10 Essential (primary) hypertension: Secondary | ICD-10-CM

## 2013-12-31 DIAGNOSIS — N760 Acute vaginitis: Secondary | ICD-10-CM

## 2013-12-31 DIAGNOSIS — M797 Fibromyalgia: Secondary | ICD-10-CM

## 2013-12-31 DIAGNOSIS — N898 Other specified noninflammatory disorders of vagina: Secondary | ICD-10-CM

## 2013-12-31 LAB — POCT WET PREP (WET MOUNT): Clue Cells Wet Prep Whiff POC: NEGATIVE

## 2013-12-31 MED ORDER — FLUCONAZOLE 150 MG PO TABS: 150.0000 mg | ORAL_TABLET | Freq: Once | ORAL | Status: DC

## 2013-12-31 NOTE — Assessment & Plan Note (Signed)
Improved with Neurontin, no longer needing tramadol.

## 2013-12-31 NOTE — Assessment & Plan Note (Signed)
Well controlled. Continue current medication.  

## 2013-12-31 NOTE — Patient Instructions (Addendum)
Continue working on healthy eating, exercise and weight loss. Schedule CPX with labs prior in  6 months. Diflucan x 1 for yeast infection.

## 2013-12-31 NOTE — Progress Notes (Signed)
   Subjective:    Patient ID: Sierra EnsignChristina Schreiter, female    DOB: 1980/08/22, 33 y.o.   MRN: [id number]  HPI  33 year old female presents for follow up HTN.  Hypertension:   On HCTZ  Improved control from last OV when BP elevated likely due to lifestyle and stress. BP Readings from Last 3 Encounters:  12/31/13 119/77  10/02/13 165/92  09/18/13 126/86  Using medication without problems or lightheadedness: none Chest pain with exertion:None Edema:None Short of breath:None Average home BPs:120/70s Other issues:  Wt Readings from Last 3 Encounters:  12/31/13 392 lb 4 oz (177.923 kg)  10/02/13 387 lb 8 oz (175.769 kg)  09/18/13 389 lb 8 oz (176.676 kg)   Leg pain continues to improve on Neurontin 3 times a day. No SE of Neurontin. She is no longer requiring tramadol. She has been working on walking more.  She thinks she may have a yeast infection. Vaginal itch   nd burning in the last 4 days. Thick milky discharge. No recent antibiotics. No vaginal ulcers. She has hx of vaginal infections in past, this feels similar.  Review of Systems  Constitutional: Negative for fever and fatigue.  HENT: Negative for ear pain.   Eyes: Negative for pain.  Respiratory: Negative for chest tightness and shortness of breath.   Cardiovascular: Negative for chest pain, palpitations and leg swelling.  Gastrointestinal: Negative for abdominal pain.  Genitourinary: Negative for dysuria.       Objective:   Physical Exam  Constitutional: Vital signs are normal. She appears well-developed and well-nourished. She is cooperative.  Non-toxic appearance. She does not appear ill. No distress.  HENT:  Head: Normocephalic.  Right Ear: Hearing, tympanic membrane, external ear and ear canal normal. Tympanic membrane is not erythematous, not retracted and not bulging.  Left Ear: Hearing, tympanic membrane, external ear and ear canal normal. Tympanic membrane is not erythematous, not retracted and not bulging.    Nose: No mucosal edema or rhinorrhea. Right sinus exhibits no maxillary sinus tenderness and no frontal sinus tenderness. Left sinus exhibits no maxillary sinus tenderness and no frontal sinus tenderness.  Mouth/Throat: Uvula is midline, oropharynx is clear and moist and mucous membranes are normal.  Eyes: Conjunctivae, EOM and lids are normal. Pupils are equal, round, and reactive to light. Lids are everted and swept, no foreign bodies found.  Neck: Trachea normal and normal range of motion. Neck supple. Carotid bruit is not present. No thyroid mass and no thyromegaly present.  Cardiovascular: Normal rate, regular rhythm, S1 normal, S2 normal, normal heart sounds, intact distal pulses and normal pulses.  Exam reveals no gallop and no friction rub.   No murmur heard. Pulmonary/Chest: Effort normal and breath sounds normal. No tachypnea. No respiratory distress. She has no decreased breath sounds. She has no wheezes. She has no rhonchi. She has no rales.  Abdominal: Soft. Normal appearance and bowel sounds are normal. There is no tenderness.  Genitourinary:  Self wet prep, no vag exam  Neurological: She is alert.  Skin: Skin is warm, dry and intact. No rash noted.  Psychiatric: Her speech is normal and behavior is normal. Judgment and thought content normal. Her mood appears not anxious. Cognition and memory are normal. She does not exhibit a depressed mood.          Assessment & Plan:

## 2013-12-31 NOTE — Addendum Note (Signed)
Addended by: Kerby NoraBEDSOLE, Sion Reinders E on: 12/31/2013 08:57 AM   Modules accepted: Orders

## 2013-12-31 NOTE — Progress Notes (Signed)
Pre visit review using our clinic review tool, if applicable. No additional management support is needed unless otherwise documented below in the visit note. 

## 2014-01-05 ENCOUNTER — Telehealth: Payer: Self-pay | Admitting: Family Medicine

## 2014-01-05 MED ORDER — FLUCONAZOLE 150 MG PO TABS: 150.0000 mg | ORAL_TABLET | Freq: Once | ORAL | Status: DC

## 2014-01-05 NOTE — Telephone Encounter (Signed)
Pt was seen on 12/31/13 and prescribed fluconazole (DIFLUCAN) 150 MG tablet [[id number]  This medication went to mail order pharmacy and [facility] [address]  Pt disconnected call before could confirm if pharmacy [place].  No answer when tried to call pt back.  Please sent to local pharmacy.

## 2014-01-05 NOTE — Telephone Encounter (Signed)
[facility] [address] [facility] [address] [place].

## 2014-03-23 ENCOUNTER — Encounter: Payer: Self-pay | Admitting: Family Medicine

## 2014-03-23 ENCOUNTER — Ambulatory Visit (INDEPENDENT_AMBULATORY_CARE_PROVIDER_SITE_OTHER): Admitting: Family Medicine

## 2014-03-23 VITALS — BP 139/80 | HR 81 | Temp 98.7°F | Ht 62.75 in | Wt >= 6400 oz

## 2014-03-23 DIAGNOSIS — F5081 Binge eating disorder: Secondary | ICD-10-CM | POA: Insufficient documentation

## 2014-03-23 DIAGNOSIS — F50819 Binge eating disorder, unspecified: Secondary | ICD-10-CM

## 2014-03-23 DIAGNOSIS — F508 Other eating disorders: Secondary | ICD-10-CM

## 2014-03-23 DIAGNOSIS — R631 Polydipsia: Secondary | ICD-10-CM

## 2014-03-23 LAB — COMPREHENSIVE METABOLIC PANEL
ALK PHOS: 74 U/L (ref 39–117)
ALT: 22 U/L (ref 0–35)
AST: 25 U/L (ref 0–37)
Albumin: 4 g/dL (ref 3.5–5.2)
BILIRUBIN TOTAL: 0.5 mg/dL (ref 0.2–1.2)
BUN: 13 mg/dL (ref 6–23)
CO2: 27 meq/L (ref 19–32)
Calcium: 9.8 mg/dL (ref 8.4–10.5)
Chloride: 104 mEq/L (ref 96–112)
Creatinine, Ser: 0.93 mg/dL (ref 0.40–1.20)
GFR: 74.52 mL/min (ref >60.00)
GLUCOSE: 90 mg/dL (ref 70–99)
Potassium: 4.1 mEq/L (ref 3.5–5.1)
SODIUM: 136 meq/L (ref 135–145)
TOTAL PROTEIN: 7.3 g/dL (ref 6.0–8.3)

## 2014-03-23 LAB — HEMOGLOBIN A1C: Hgb A1c MFr Bld: 5 % (ref 4.6–6.5)

## 2014-03-23 LAB — TSH: TSH: 1.49 u[IU]/mL (ref 0.35–4.50)

## 2014-03-23 LAB — GLUCOSE, POCT (MANUAL RESULT ENTRY): POC Glucose: 80 mg/dl (ref 70–99)

## 2014-03-23 MED ORDER — GABAPENTIN 600 MG PO TABS: 600.0000 mg | ORAL_TABLET | Freq: Three times a day (TID) | ORAL | Status: DC

## 2014-03-23 NOTE — Progress Notes (Signed)
   Subjective:    Patient ID: Sierra [name] [place], female    DOB: 1981/01/08, 34 y.o.   MRN: [id number]  HPI  34 year old female pt with super morbid obesity presents with new onset polydipsia, urinary frequency and binge eating. She is concerned she may have diabetes. Denies hematuria, no dysuria.  Blood sugar fasting  in office today 80.  She sees mental health for bipolar disorder. She is on Wellbutrin and Abilify for this. They are considering medication to treat binge eating. She has follow up in 4 weeks.  Grandmother and cousins with diabetes.  Wt Readings from Last 3 Encounters:  03/23/14 400 lb 8 oz (181.666 kg)  12/31/13 392 lb 4 oz (177.923 kg)  10/02/13 387 lb 8 oz (175.769 kg)     Review of Systems  Constitutional: Negative for fever and fatigue.  HENT: Negative for ear pain.   Eyes: Negative for pain.  Respiratory: Negative for chest tightness and shortness of breath.   Cardiovascular: Negative for chest pain, palpitations and leg swelling.  Gastrointestinal: Negative for abdominal pain.  Genitourinary: Negative for dysuria.       Objective:   Physical Exam  Constitutional: Vital signs are normal. She appears well-developed and well-nourished. She is cooperative.  Non-toxic appearance. She does not appear ill. No distress.  Morbidly obese  HENT:  Head: Normocephalic.  Right Ear: Hearing, tympanic membrane, external ear and ear canal normal. Tympanic membrane is not erythematous, not retracted and not bulging.  Left Ear: Hearing, tympanic membrane, external ear and ear canal normal. Tympanic membrane is not erythematous, not retracted and not bulging.  Nose: No mucosal edema or rhinorrhea. Right sinus exhibits no maxillary sinus tenderness and no frontal sinus tenderness. Left sinus exhibits no maxillary sinus tenderness and no frontal sinus tenderness.  Mouth/Throat: Uvula is midline, oropharynx is clear and moist and mucous membranes are normal.  Eyes:  Conjunctivae, EOM and lids are normal. Pupils are equal, round, and reactive to light. Lids are everted and swept, no foreign bodies found.  Neck: Trachea normal and normal range of motion. Neck supple. Carotid bruit is not present. No thyroid mass and no thyromegaly present.  Cardiovascular: Normal rate, regular rhythm, S1 normal, S2 normal, normal heart sounds, intact distal pulses and normal pulses.  Exam reveals no gallop and no friction rub.   No murmur heard. Pulmonary/Chest: Effort normal and breath sounds normal. No tachypnea. No respiratory distress. She has no decreased breath sounds. She has no wheezes. She has no rhonchi. She has no rales.  Abdominal: Soft. Normal appearance and bowel sounds are normal. There is no tenderness.  Neurological: She is alert.  Skin: Skin is warm, dry and intact. No rash noted.  Psychiatric: Her speech is normal and behavior is normal. Judgment and thought content normal. Her mood appears not anxious. Cognition and memory are normal. She does not exhibit a depressed mood.          Assessment & Plan:

## 2014-03-23 NOTE — Addendum Note (Signed)
Addended by: Baldomero LamyHAVERS, NATASHA C on: 03/23/2014 03:28 PM   Modules accepted: [employer]

## 2014-03-23 NOTE — Assessment & Plan Note (Signed)
Possible hypoglycemia may be contributing to this issue. Pt encouraged to eat 5 small meals daily, low carb.

## 2014-03-23 NOTE — Progress Notes (Signed)
Pre visit review using our clinic review tool, if applicable. No additional management support is needed unless otherwise documented below in the visit note. 

## 2014-03-23 NOTE — Patient Instructions (Signed)
Work on [employer] eating, regular exercise and weight loss. Stop by lab on way out.

## 2014-03-23 NOTE — Assessment & Plan Note (Signed)
Counseled on healthy eating, exercise and weight loss.  If blood sugar up... Will consider victoza as an option to treat .

## 2014-03-23 NOTE — Assessment & Plan Note (Signed)
Will eval thyroid and diabetes tests. ? Med SE.

## 2014-06-29 ENCOUNTER — Other Ambulatory Visit

## 2014-07-06 ENCOUNTER — Encounter: Admitting: Family Medicine

## 2014-07-27 ENCOUNTER — Encounter: Payer: Self-pay | Admitting: Psychiatry

## 2014-07-27 ENCOUNTER — Other Ambulatory Visit: Payer: Self-pay

## 2014-07-27 ENCOUNTER — Ambulatory Visit (INDEPENDENT_AMBULATORY_CARE_PROVIDER_SITE_OTHER): Admitting: Psychiatry

## 2014-07-27 VITALS — BP 138/88 | HR 84 | Temp 97.8°F | Ht 64.0 in | Wt >= 6400 oz

## 2014-07-27 DIAGNOSIS — F3160 Bipolar disorder, current episode mixed, unspecified: Secondary | ICD-10-CM

## 2014-07-27 MED ORDER — TOPIRAMATE 50 MG PO TABS: 50.0000 mg | ORAL_TABLET | Freq: Every day | ORAL | Status: DC

## 2014-07-27 MED ORDER — RAMELTEON 8 MG PO TABS: 8.0000 mg | ORAL_TABLET | Freq: Every day | ORAL | Status: DC

## 2014-07-27 MED ORDER — ARIPIPRAZOLE 20 MG PO TABS: 10.0000 mg | ORAL_TABLET | Freq: Every day | ORAL | Status: DC

## 2014-07-27 MED ORDER — LAMOTRIGINE 25 MG PO TABS: 25.0000 mg | ORAL_TABLET | Freq: Every day | ORAL | Status: DC

## 2014-07-27 NOTE — Progress Notes (Signed)
Psychiatric Initial Adult Assessment   Patient Identification: Sierra EnsignChristina [name] MRN:  [id number] Date of Evaluation:  07/27/2014 [facility] Chief Complaint:   Chief Complaint    Manic Behavior     Visit Diagnosis:    ICD-9-CM ICD-10-CM   1. Bipolar affective disorder, current episode mixed, without psychotic features, current episode severity unspecified 296.80 F31.60    Diagnosis:   Patient Active Problem List   Diagnosis Date Noted  . Polydipsia [R63.1] 03/23/2014  . Binge eating disorder [F50.8] 03/23/2014  . Vaginitis and vulvovaginitis [N76.0] 12/31/2013  . Paronychia [L03.019] 09/21/2013  . Essential hypertension, benign [I10] 06/05/2013  . Bilateral leg pain [M79.604, M79.605] 05/22/2013  . Right carpal tunnel syndrome [G56.01] 09/16/2012  . General counseling and advice on female contraception [Z30.09] 01/22/2012  . Anhydramnios [O41.00X0] 01/02/2012  . Umbilical cord complication [O69.9XX0] 12/07/2011  . High-risk pregnancy [O09.90] 11/09/2011  . Affective bipolar disorder [F31.9] 10/12/2011  . H/O cesarean section [Z98.89] 10/12/2011  . Calculus of kidney [N20.0] 10/12/2011  . Obesity affecting pregnancy, antepartum [O99.210, E66.9] 10/12/2011  . SNORING [R06.09, R09.89] 07/23/2007  . EDEMA [R60.9] 06/11/2007  . MORBID OBESITY [E66.01] 12/23/2006  . BIPOLAR AFFECTIVE DISORDER [F31.9] 12/23/2006  . ANXIETY [F41.1] 12/23/2006  . DEPRESSION [F32.9] 12/23/2006  . GERD [K21.9] 12/23/2006  . MILD/UNSPEC PRE-ECLAMPSIA UNSPEC AS EPISODE CARE [O14.90] 12/23/2006  . Fibromyalgia [M79.7] 12/23/2006   History of Present Illness:    She is a 34 year old morbidly obese female who presented for initial assessment. She reported that [place] and decided to switch her psychiatrist at this time. She reported that she has stopped taking her medications in June as she was planning to become pregnant but then she  found out that her stepson is also planning to become pregnant and she does not want to do the same as they have been trying to be pregnant for the past 2-3 years and it will be hard for them as she will be pregnant at the same time. She is now having to have her tubes tied. Patient reported that she has long history of bipolar disorder since 2008 when she was hospitalized [facility] at [facility] and was seen by Dr. Delana Meyeravid . She reported that she was given several psycho topic medications and has been stabilized. Patient reported that since then  her medications have been adjusted. She responded well to the lamotrigine Abilify and Wellbutrin. However she wants to restarted back on the medications. Patient reported that currently she feels irritable and agitated and depressed. She loses temper quickly. She reported that she has been trying to handle the stress at home with 2 little kids as well as with her mother. She does not have any anxiety. She denied having any suicidal ideations or plans.  Elements:  Location:  acute. Associated Signs/Symptoms: Depression Symptoms:  depressed mood, feelings of worthlessness/guilt, anxiety, (Hypo) Manic Symptoms:  Impulsivity, Irritable Mood, Labiality of Mood, Anxiety Symptoms:  Excessive Worry, Psychotic Symptoms:  denied PTSD Symptoms: NA  Past Medical History:  Past Medical History  Diagnosis Date  . Anxiety state, unspecified   . Bipolar disorder, unspecified   . Depressive disorder, not elsewhere classified   . Edema   . Myalgia and myositis, unspecified   . Abdominal pain, other specified site   . Esophageal reflux   . Mild or unspecified pre-eclampsia, unspecified as to episode of care   . Morbid obesity   . Other dyspnea and respiratory abnormality   .  Urinary tract infection, site not specified   . History of kidney stones   . Preeclampsia     with first pregnancy    Past Surgical History  Procedure Laterality Date  .  Cholecystectomy  03/2006  . Lithotripsy    . Cesarean section    . Tonsillectomy and adenoidectomy     Family History:  Family History  Problem Relation Age of Onset  . Depression Father   . Alcohol abuse Father   . Depression Mother   . Hyperlipidemia Mother   . Sleep apnea Mother   . Coronary artery disease Maternal Grandmother   . Heart attack Maternal Grandmother   . Diabetes Maternal Grandmother   . Lung cancer Maternal Grandfather   . Emphysema Maternal Grandfather   . Coronary artery disease Maternal Grandfather   . Lupus      Aunt  . Fibromyalgia      Aunt  . Depression Sister   . Hyperlipidemia Brother   . Depression Brother   . Hyperlipidemia Brother   . Depression Brother   . Alcohol abuse Brother    Social History:   History   Social History  . Marital Status: Married    Spouse Name: N/A  . Number of Children: 1  . Years of Education: N/A   Occupational History  . Home maker    Social History Main Topics  . Smoking status: Never Smoker   . Smokeless tobacco: Never Used  . Alcohol Use: 0.6 oz/week    1 Shots of liquor per week     Comment: occasional < 1 month  . Drug Use: No  . Sexual Activity: Yes    Birth Control/ Protection: None   Other Topics Concern  . None   Social History Narrative   1 child, 2 step-sons      Regular exercise-no   Diet: no fast food, likes sweets   Additional Social History:  Married X 11 years. Has  Two sons ages  42 and 63.  Has 2 step sons ages 72 and 53. 65 yo is in Hotel manager and is married. 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  Husband is a retired Administrator, [employer] and has PTSD.    Musculoskeletal: Strength & Muscle Tone: within normal limits Gait & Station: normal Patient leans: N/A  Psychiatric Specialty Exam: HPI  Review of Systems  Constitutional: Positive for weight loss and malaise/fatigue.  Eyes: Negative.   Respiratory: Negative.   Cardiovascular: Negative for palpitations and leg swelling.  Gastrointestinal:  Negative.   Genitourinary: Negative.   Musculoskeletal: Positive for back pain, joint pain and neck pain.  Skin: Negative for rash.  Neurological: Negative.   Psychiatric/Behavioral: Positive for depression. Negative for suicidal ideas. The patient is nervous/anxious and has insomnia.     Blood pressure 138/88, pulse 84, temperature 97.8 F (36.6 C), temperature source Tympanic, height  (1.626 m), weight 405 lb 9.6 oz (183.979 kg), last menstrual period 07/20/2014, SpO2 99 %.Body mass index is 69.59 kg/(m^2).  General Appearance: Casual  Eye Contact:  Fair  Speech:  Clear and Coherent  Volume:  Normal  Mood:  Anxious and Depressed  Affect:  Congruent  Thought Process:  Coherent  Orientation:  Full (Time, Place, and Person)  Thought Content:  WDL  Suicidal Thoughts:  No  Homicidal Thoughts:  No  Memory:  Immediate;   Fair  Judgement:  Fair  Insight:  Fair  Psychomotor Activity:  Normal  Concentration:  Fair  Recall:  Fair  Fund of Knowledge:Fair  Language: Fair  Akathisia:  No  Handed:  Right  AIMS (if indicated):    Assets:  Communication Skills Desire for Improvement Social Support  ADL's:  Intact  Cognition: WNL  Sleep:  Difficulty going to sleep   Is the patient at risk to self?  No. Has the patient been a risk to self in the past 6 months?  No. Has the patient been a risk to self within the distant past?  No. Is the patient a risk to others?  No. Has the patient been a risk to others in the past 6 months?  No. Has the patient been a risk to others within the distant past?  No.  Allergies:  No Known Allergies Current Medications: Current Outpatient Prescriptions  Medication Sig Dispense Refill  . ARIPiprazole (ABILIFY) 20 MG tablet Take 20 mg by mouth daily.    Marland Kitchen buPROPion (WELLBUTRIN XL) 150 MG 24 hr tablet Take 300 mg by mouth daily.     . clonazePAM (KLONOPIN) 1 MG tablet Take 1 mg by mouth 2 (two) times daily as needed.    . Eszopiclone 3 MG TABS Take 3  mg by mouth at bedtime. Take immediately before bedtime    . lamoTRIgine (LAMICTAL) 100 MG tablet Take 300 mg by mouth at bedtime.     . meloxicam (MOBIC) 15 MG tablet Take 15 mg by mouth daily.  0  . SAVELLA 50 MG TABS tablet   0  . levonorgestrel (MIRENA) 20 MCG/24HR IUD 1 each by Intrauterine route once.     No current facility-administered medications for this visit.    Previous Psychotropic Medications: { Tried Seroquel, Trazodone, Lithium, effexor, Cymbalta, gabapentin ( too sedating ), Lyrica( weight gain ), geodon.    Substance Abuse History in the last 12 months:  No.  Consequences of Substance Abuse: NA  Medical Decision Making:  Review of Psycho-Social Stressors (1) and Established Problem, Worsening (2)  Treatment Plan Summary: Medication management   Discussed with patient about the medications and I will start her on lamotrigine 25 mg by mouth daily for 2 weeks and will then titrate the dose to 50 mg daily. She agreed with the plan. She will also be started on Abilify 10 mg by mouth daily. She'll be started on Topamax 50 mg daily to help with her more stabilization and weight loss and she agreed with the plan. She'll be given Rozerem 8 mg to help with the insomnia Will follow-up in 3 weeks or earlier depending on her symptoms   More than 50% of the time spent in psychoeducation, counseling and coordination of care.    This note was generated in part or whole with voice recognition software. Voice regonition is usually quite accurate but there are transcription errors that can and very often do occur. I apologize for any typographical errors that were not detected and corrected.     Brandy Hale 7/12/20169:02 AM

## 2014-08-17 ENCOUNTER — Ambulatory Visit (INDEPENDENT_AMBULATORY_CARE_PROVIDER_SITE_OTHER): Admitting: Psychiatry

## 2014-08-17 ENCOUNTER — Encounter: Payer: Self-pay | Admitting: Psychiatry

## 2014-08-17 VITALS — BP 132/88 | HR 108 | Temp 98.6°F | Ht 64.0 in | Wt >= 6400 oz

## 2014-08-17 DIAGNOSIS — F3160 Bipolar disorder, current episode mixed, unspecified: Secondary | ICD-10-CM | POA: Diagnosis not present

## 2014-08-17 MED ORDER — TOPIRAMATE 100 MG PO TABS: 100.0000 mg | ORAL_TABLET | Freq: Every day | ORAL | Status: DC

## 2014-08-17 MED ORDER — LAMOTRIGINE 25 MG PO TABS: 50.0000 mg | ORAL_TABLET | Freq: Every day | ORAL | Status: DC

## 2014-08-17 MED ORDER — ARIPIPRAZOLE 20 MG PO TABS: 20.0000 mg | ORAL_TABLET | Freq: Every day | ORAL | Status: DC

## 2014-08-17 NOTE — Progress Notes (Signed)
Psychiatric Follow Up Notes.   Patient Identification: Sierra [name] MRN:  [id number] Date of Evaluation:  08/17/2014 [facility] Chief Complaint:   Chief Complaint    Follow-up     Visit Diagnosis:    ICD-9-CM ICD-10-CM   1. Mixed bipolar I disorder 296.60 F31.60    Diagnosis:   Patient Active Problem List   Diagnosis Date Noted  . Polydipsia [R63.1] 03/23/2014  . Binge eating disorder [F50.8] 03/23/2014  . Vaginitis and vulvovaginitis [N76.0] 12/31/2013  . Paronychia [L03.019] 09/21/2013  . Essential hypertension, benign [I10] 06/05/2013  . Bilateral leg pain [M79.604, M79.605] 05/22/2013  . Right carpal tunnel syndrome [G56.01] 09/16/2012  . General counseling and advice on female contraception [Z30.09] 01/22/2012  . Anhydramnios [O41.00X0] 01/02/2012  . Umbilical cord complication [O69.9XX0] 12/07/2011  . High-risk pregnancy [O09.90] 11/09/2011  . Affective bipolar disorder [F31.9] 10/12/2011  . H/O cesarean section [Z98.89] 10/12/2011  . Calculus of kidney [N20.0] 10/12/2011  . Obesity affecting pregnancy, antepartum [O99.210, E66.9] 10/12/2011  . SNORING [R06.09, R09.89] 07/23/2007  . EDEMA [R60.9] 06/11/2007  . MORBID OBESITY [E66.01] 12/23/2006  . BIPOLAR AFFECTIVE DISORDER [F31.9] 12/23/2006  . ANXIETY [F41.1] 12/23/2006  . DEPRESSION [F32.9] 12/23/2006  . GERD [K21.9] 12/23/2006  . MILD/UNSPEC PRE-ECLAMPSIA UNSPEC AS EPISODE CARE [O14.90] 12/23/2006  . Fibromyalgia [M79.7] 12/23/2006   History of Present Illness:    She is a 34 year old morbidly obese female who presented for  follow-up appointment. She reported that she is feeling better since she was started on Topamax. She reported that her appetite has significantly reduced and she  has to make herself eat at the dinner with her family members. Patient reported that her mood has stabilized on lamotrigine 50 mg in combination with the Abilify. She does not have any mood   swings anger anxiety or paranoia. She reported that her family members have also noticed change in her mood symptoms. She is very excited with the current combination of medications. She reported that she does not want to change anything. She is sleeping well and is not taking any Rozerem at this time. She denied having any perceptual disturbances. She denied having any suicidal ideations or plans.   Past Medical History:  Past Medical History  Diagnosis Date  . Anxiety state, unspecified   . Bipolar disorder, unspecified   . Depressive disorder, not elsewhere classified   . Edema   . Myalgia and myositis, unspecified   . Abdominal pain, other specified site   . Esophageal reflux   . Mild or unspecified pre-eclampsia, unspecified as to episode of care   . Morbid obesity   . Other dyspnea and respiratory abnormality   . Urinary tract infection, site not specified   . History of kidney stones   . Preeclampsia     with first pregnancy    Past Surgical History  Procedure Laterality Date  . Cholecystectomy  03/2006  . Lithotripsy    . Cesarean section    . Tonsillectomy and adenoidectomy     Family History:  Family History  Problem Relation Age of Onset  . Depression Father   . Alcohol abuse Father   . Depression Mother   . Hyperlipidemia Mother   . Sleep apnea Mother   . Coronary artery disease Maternal Grandmother   . Heart attack Maternal Grandmother   . Diabetes Maternal Grandmother   . Lung cancer Maternal Grandfather   . Emphysema Maternal Grandfather   . Coronary artery disease Maternal Grandfather   .  Lupus      Aunt  . Fibromyalgia      Aunt  . Depression Sister   . Hyperlipidemia Brother   . Depression Brother   . Hyperlipidemia Brother   . Depression Brother   . Alcohol abuse Brother    Social History:   History   Social History  . Marital Status: Married    Spouse Name: N/A  . Number of Children: 1  . Years of Education: N/A   Occupational History   . Home maker    Social History Main Topics  . Smoking status: Never Smoker   . Smokeless tobacco: Never Used  . Alcohol Use: 0.6 oz/week    1 Shots of liquor per week     Comment: occasional < 1 month  . Drug Use: No  . Sexual Activity: Yes    Birth Control/ Protection: None   Other Topics Concern  . Not on file   Social History Narrative   1 child, 2 step-sons      Regular exercise-no   Diet: no fast food, likes sweets   Additional Social History:  Married X 11 years. Has  Two sons ages  75 and 54.  Has 2 step sons ages 67 and 46. 31 yo is in Hotel manager and is married. 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  Husband is a retired Administrator, [employer] and has PTSD.    Musculoskeletal: Strength & Muscle Tone: within normal limits Gait & Station: normal Patient leans: N/A  Psychiatric Specialty Exam: HPI   Review of Systems  Constitutional: Positive for weight loss and malaise/fatigue.  Eyes: Negative.   Respiratory: Negative.   Cardiovascular: Negative for palpitations and leg swelling.  Gastrointestinal: Negative.   Genitourinary: Negative.   Musculoskeletal: Negative for back pain, joint pain and neck pain.  Skin: Negative for rash.  Neurological: Negative.   Psychiatric/Behavioral: Negative for depression and suicidal ideas. The patient is nervous/anxious. The patient does not have insomnia.     Last menstrual period 07/20/2014.There is no weight on file to calculate BMI.  General Appearance: Casual  Eye Contact:  Fair  Speech:  Clear and Coherent  Volume:  Normal  Mood:  Euthymic  Affect:  Congruent  Thought Process:  Coherent  Orientation:  Full (Time, Place, and Person)  Thought Content:  WDL  Suicidal Thoughts:  No  Homicidal Thoughts:  No  Memory:  Immediate;   Fair  Judgement:  Fair  Insight:  Fair  Psychomotor Activity:  Normal  Concentration:  Fair  Recall:  [employer] of Knowledge:Fair  Language: Fair  Akathisia:  No  Handed:  Right  AIMS (if indicated):    Assets:   Communication Skills Desire for Improvement Social Support  ADL's:  Intact  Cognition: WNL  Sleep:  Difficulty going to sleep   Is the patient at risk to self?  No. Has the patient been a risk to self in the past 6 months?  No. Has the patient been a risk to self within the distant past?  No. Is the patient a risk to others?  No. Has the patient been a risk to others in the past 6 months?  No. Has the patient been a risk to others within the distant past?  No.  Allergies:  No Known Allergies Current Medications: Current Outpatient Prescriptions  Medication Sig Dispense Refill  . ARIPiprazole (ABILIFY) 20 MG tablet Take 0.5 tablets (10 mg total) by mouth daily. 90 tablet 1  . lamoTRIgine (LAMICTAL) 25 MG  tablet Take 1 tablet (25 mg total) by mouth daily. Take 1 pill in am x 2 weeks then 2 pills in am 60 tablet 0  . levonorgestrel (MIRENA) 20 MCG/24HR IUD 1 each by Intrauterine route once.    . meloxicam (MOBIC) 15 MG tablet Take 15 mg by mouth daily.  0  . ramelteon (ROZEREM) 8 MG tablet Take 1 tablet (8 mg total) by mouth at bedtime. 30 tablet 0  . SAVELLA 50 MG TABS tablet   0  . topiramate (TOPAMAX) 50 MG tablet Take 1 tablet (50 mg total) by mouth at bedtime. 30 tablet 0   No current facility-administered medications for this visit.    Previous Psychotropic Medications: { Tried Seroquel, Trazodone, Lithium, effexor, Cymbalta, gabapentin ( too sedating ), Lyrica( weight gain ), geodon.    Substance Abuse History in the last 12 months:  No.  Consequences of Substance Abuse: NA  Medical Decision Making:  Review of Psycho-Social Stressors (1) and Established Problem, Worsening (2)  Treatment Plan Summary: Medication management   Discussed with patient or the medications and she will continue on lamotrigine 50 mg daily. She will also continue on Topamax 50 mg daily. She was taking Abilify 10 mg daily. She was given medications through  her mail order pharmacy and she agreed  with the plan. Follow up in 2 months or earlier.   More than 50% of the time spent in psychoeducation, counseling and coordination of care.    This note was generated in part or whole with voice recognition software. Voice regonition is usually quite accurate but there are transcription errors that can and very often do occur. I apologize for any typographical errors that were not detected and corrected.     Brandy Hale 8/2/201610:56 AM

## 2014-10-19 ENCOUNTER — Ambulatory Visit (INDEPENDENT_AMBULATORY_CARE_PROVIDER_SITE_OTHER): Admitting: Psychiatry

## 2014-10-19 ENCOUNTER — Encounter: Payer: Self-pay | Admitting: Psychiatry

## 2014-10-19 VITALS — BP 144/98 | HR 118 | Temp 98.4°F | Ht 64.0 in | Wt >= 6400 oz

## 2014-10-19 DIAGNOSIS — F3160 Bipolar disorder, current episode mixed, unspecified: Secondary | ICD-10-CM | POA: Diagnosis not present

## 2014-10-19 MED ORDER — TOPIRAMATE 100 MG PO TABS: 100.0000 mg | ORAL_TABLET | Freq: Every day | ORAL | Status: DC

## 2014-10-19 MED ORDER — TOPIRAMATE 50 MG PO TABS
50.0000 mg | ORAL_TABLET | Freq: Every day | ORAL | Status: DC
Start: 2014-10-19 — End: 2015-03-17

## 2014-10-19 NOTE — Progress Notes (Signed)
Psychiatric Follow Up Notes.   Patient Identification: Sierra [name] MRN:  [id number] Date of Evaluation:  10/19/2014 [facility] Chief Complaint:   Chief Complaint    Follow-up; Medication Reaction; Stress     Visit Diagnosis:  Bipolar Disorder, Mixed.  Binge Eating Disorder   Diagnosis:   Patient Active Problem List   Diagnosis Date Noted  . Polydipsia [R63.1] 03/23/2014  . Binge eating disorder [F50.81] 03/23/2014  . Vaginitis and vulvovaginitis [N76.0] 12/31/2013  . Paronychia [L03.019] 09/21/2013  . Essential hypertension, benign [I10] 06/05/2013  . Bilateral leg pain [M79.604, M79.605] 05/22/2013  . Right carpal tunnel syndrome [G56.01] 09/16/2012  . General counseling and advice on female contraception [Z30.09] 01/22/2012  . Anhydramnios [O41.00X0] 01/02/2012  . Umbilical cord complication [O69.9XX0] 12/07/2011  . High-risk pregnancy [O09.90] 11/09/2011  . Affective bipolar disorder (HCC) [F31.9] 10/12/2011  . H/O cesarean section [Z98.891] 10/12/2011  . Calculus of kidney [N20.0] 10/12/2011  . Obesity affecting pregnancy, antepartum [O99.210, E66.9] 10/12/2011  . SNORING [R06.09, R09.89] 07/23/2007  . EDEMA [R60.9] 06/11/2007  . MORBID OBESITY [E66.01] 12/23/2006  . BIPOLAR AFFECTIVE DISORDER [F31.9] 12/23/2006  . ANXIETY [F41.1] 12/23/2006  . DEPRESSION [F32.9] 12/23/2006  . GERD [K21.9] 12/23/2006  . MILD/UNSPEC PRE-ECLAMPSIA UNSPEC AS EPISODE CARE [IMO0002] 12/23/2006  . Fibromyalgia [M79.7] 12/23/2006   History of Present Illness:    She is a 34 year old morbidly obese female who presented for  follow-up appointment. She reported that she is feeling better although she has several stressors in her life. She reported that her stepson is going to be deployed soon and he is planning to stay one month with his wife in their home. She reported that she is cleaning her home and making room for them. Her mother in law  is also  coming to stay with them. Patient reported that she is not losing her temper and is very calm and collective. She reported that the medications are working effectively. She is also very excited as she has been losing weight on a regular basis. She has reduced appetite and she reported that she is not experiencing any adverse effects of the medications. Patient reported that she takes Abilify 10 mg but it is difficult for her to split the pill into half as it gets crumbled. She currently denied having any adverse effects of the medication. She currently denied having any suicidal homicidal ideations or plans.  Patient appeared calm and cooperative during the interview. She has been sleeping well during the night. She reported that her depressive symptoms are under control at this time.   Past Medical History:  Past Medical History  Diagnosis Date  . Anxiety state, unspecified   . Bipolar disorder, unspecified (HCC)   . Depressive disorder, not elsewhere classified   . Edema   . Myalgia and myositis, unspecified   . Abdominal pain, other specified site   . Esophageal reflux   . Mild or unspecified pre-eclampsia, unspecified as to episode of care   . Morbid obesity (HCC)   . Other dyspnea and respiratory abnormality   . Urinary tract infection, site not specified   . History of kidney stones   . Preeclampsia     with first pregnancy    Past Surgical History  Procedure Laterality Date  . Cholecystectomy  03/2006  . Lithotripsy    . Cesarean section    . Tonsillectomy and adenoidectomy     Social History:   Social History   Social History  . Marital  Status: Married    Spouse Name: N/A  . Number of Children: 1  . Years of Education: N/A   Occupational History  . Home maker    Social History Main Topics  . Smoking status: Never Smoker   . Smokeless tobacco: Never Used  . Alcohol Use: 0.6 oz/week    1 Shots of liquor per week     Comment: occasional < 1 month  . Drug Use: No  .  Sexual Activity: Yes    Birth Control/ Protection: None   Other Topics Concern  . None   Social History Narrative   1 child, 2 step-sons      Regular exercise-no   Diet: no fast food, likes sweets   Additional Social History:  Married X 11 years. Has  Two sons ages  17 and 14.  Has 2 step sons ages 30 and 17. 15 yo is in Hotel manager and is married. 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  Husband is a retired Administrator, [employer] Service and has PTSD.    Musculoskeletal: Strength & Muscle Tone: within normal limits Gait & Station: normal Patient leans: N/A  Psychiatric Specialty Exam: HPI   Review of Systems  Constitutional: Positive for weight loss and malaise/fatigue.  Eyes: Negative.   Respiratory: Negative.   Cardiovascular: Negative for palpitations and leg swelling.  Gastrointestinal: Negative.   Genitourinary: Negative.   Musculoskeletal: Negative for back pain, joint pain and neck pain.  Skin: Negative for rash.  Neurological: Negative.   Psychiatric/Behavioral: Negative for depression and suicidal ideas. The patient is nervous/anxious. The patient does not have insomnia.     Blood pressure 144/98, pulse 118, temperature 98.4 F (36.9 C), temperature source Tympanic, height 5\' 4"  (1.626 m), weight 401 lb 9.6 oz (182.165 kg), last menstrual period 09/28/2014, SpO2 99 %.Body mass index is 68.9 kg/(m^2).  General Appearance: Casual  Eye Contact:  Fair  Speech:  Clear and Coherent  Volume:  Normal  Mood:  Euthymic  Affect:  Congruent  Thought Process:  Coherent  Orientation:  Full (Time, Place, and Person)  Thought Content:  WDL  Suicidal Thoughts:  No  Homicidal Thoughts:  No  Memory:  Immediate;   Fair  Judgement:  Fair  Insight:  Fair  Psychomotor Activity:  Normal  Concentration:  Fair  Recall:  [employer] of Knowledge:Fair  Language: Fair  Akathisia:  No  Handed:  Right  AIMS (if indicated):    Assets:  Communication Skills Desire for Improvement Social Support  ADL's:  Intact   Cognition: WNL  Sleep:  Difficulty going to sleep   Is the patient at risk to self?  No. Has the patient been a risk to self in the past 6 months?  No. Has the patient been a risk to self within the distant past?  No. Is the patient a risk to others?  No. Has the patient been a risk to others in the past 6 months?  No. Has the patient been a risk to others within the distant past?  No.  Allergies:  No Known Allergies Current Medications: Current Outpatient Prescriptions  Medication Sig Dispense Refill  . ARIPiprazole (ABILIFY) 20 MG tablet Take 1 tablet (20 mg total) by mouth daily. 90 tablet 1  . lamoTRIgine (LAMICTAL) 25 MG tablet Take 2 tablets (50 mg total) by mouth daily. 180 tablet 1  . meloxicam (MOBIC) 15 MG tablet Take 15 mg by mouth daily.  0  . ramelteon (ROZEREM) 8 MG tablet Take 1 tablet (  8 mg total) by mouth at bedtime. 30 tablet 0  . SAVELLA 50 MG TABS tablet   0  . topiramate (TOPAMAX) 100 MG tablet Take 1 tablet (100 mg total) by mouth daily. 90 tablet 1  . levonorgestrel (MIRENA) 20 MCG/24HR IUD 1 each by Intrauterine route once.     No current facility-administered medications for this visit.      Substance Abuse History in the last 12 months:  No.  Consequences of Substance Abuse: NA  Medical Decision Making:  Review of Psycho-Social Stressors (1) and Established Problem, Worsening (2)  Treatment Plan Summary: Medication management   Depression Patient will continue on Abilify 20 mg on alternate days and she will take it on Monday Wednesday and Friday.  Mood stabilization She will continue on lamotrigine 50 mg daily  Binge eating disorder I will titrate the dose of Topamax to 150 mg daily and the medication was refilled for the next 90 days She is not experiencing any side effects of the medication at this time    More than 50% of the time spent in psychoeducation, counseling and coordination of care.  Time spent with the patient 25  minutes   This note was generated in part or whole with voice recognition software. Voice regonition is usually quite accurate but there are transcription errors that can and very often do occur. I apologize for any typographical errors that were not detected and corrected.     Brandy Hale 10/4/20169:03 AM

## 2014-12-16 ENCOUNTER — Encounter: Payer: Self-pay | Admitting: Psychiatry

## 2014-12-16 ENCOUNTER — Ambulatory Visit (INDEPENDENT_AMBULATORY_CARE_PROVIDER_SITE_OTHER): Admitting: Psychiatry

## 2014-12-16 DIAGNOSIS — F3160 Bipolar disorder, current episode mixed, unspecified: Secondary | ICD-10-CM | POA: Diagnosis not present

## 2014-12-16 NOTE — Progress Notes (Signed)
Called [facility] and spoke with sarah, discontinue medication. (rezerem 8mg )

## 2014-12-16 NOTE — Progress Notes (Signed)
Psychiatric Follow Up Notes.   Patient Identification: Collen Hostler MRN:  [id number] Date of Evaluation:  12/16/2014 [facility] Chief Complaint:   Chief Complaint    Follow-up; Medication Refill     Visit Diagnosis:  Bipolar Disorder, Mixed.  Binge Eating Disorder   Diagnosis:   Patient Active Problem List   Diagnosis Date Noted  . Polydipsia [R63.1] 03/23/2014  . Binge eating disorder [F50.81] 03/23/2014  . Vaginitis and vulvovaginitis [N76.0] 12/31/2013  . Paronychia [L03.019] 09/21/2013  . Essential hypertension, benign [I10] 06/05/2013  . Bilateral leg pain [M79.604, M79.605] 05/22/2013  . Right carpal tunnel syndrome [G56.01] 09/16/2012  . General counseling and advice on female contraception [Z30.09] 01/22/2012  . Anhydramnios [O41.00X0] 01/02/2012  . Umbilical cord complication [O69.9XX0] 12/07/2011  . High-risk pregnancy [O09.90] 11/09/2011  . Affective bipolar disorder (HCC) [F31.9] 10/12/2011  . H/O cesarean section [Z98.891] 10/12/2011  . Calculus of kidney [N20.0] 10/12/2011  . Obesity affecting pregnancy, antepartum [O99.210, E66.9] 10/12/2011  . Bipolar affective disorder (HCC) [F31.9] 10/12/2011  . History of cesarean section [Z98.891] 10/12/2011  . SNORING [R06.09, R09.89] 07/23/2007  . EDEMA [R60.9] 06/11/2007  . MORBID OBESITY [E66.01] 12/23/2006  . BIPOLAR AFFECTIVE DISORDER [F31.9] 12/23/2006  . ANXIETY [F41.1] 12/23/2006  . DEPRESSION [F32.9] 12/23/2006  . GERD [K21.9] 12/23/2006  . MILD/UNSPEC PRE-ECLAMPSIA UNSPEC AS EPISODE CARE [IMO0002] 12/23/2006  . Fibromyalgia [M79.7] 12/23/2006   History of Present Illness:    She is a 34 year old morbidly obese female who presented for  follow-up appointment. She reported that she is having sexual side effects from the combination of her medications. She reported that she has been taking Topamax 150 mg and has noticed some tingling in her feet as well as some burning in  her legs. She wants to decrease the dose of the medication although she has lost 10 pounds since her last appointment. She is happy about the weight loss. She reported that she was doing well when she was taking Topamax 100 mg and wants to continue the same dose. Patient reported that she has also made an appointment with her general practitioner to discuss about her medications as she is on Savella for her fibromyalgia. Patient reported that she enjoyed the Thanksgiving with her family members and cooked for them.  Patient reported that she has noticed some disturbance in her taste due to the medication and is not eating as much. She is very excited about the weight loss.  Marland Kitchen Her family is very supportive and she is walking with her mother on a daily basis. Patient currently denied having any mood swings anger anxiety or paranoia. She is motivated to lose weight at this time.  Patient reported that her daughter in law is due in March and she is expecting a boy.  She reported that she has refills available on her medications.    Past Medical History:  Past Medical History  Diagnosis Date  . Anxiety state, unspecified   . Bipolar disorder, unspecified (HCC)   . Depressive disorder, not elsewhere classified   . Edema   . Myalgia and myositis, unspecified   . Abdominal pain, other specified site   . Esophageal reflux   . Mild or unspecified pre-eclampsia, unspecified as to episode of care   . Morbid obesity (HCC)   . Other dyspnea and respiratory abnormality   . Urinary tract infection, site not specified   . History of kidney stones   . Preeclampsia     with first pregnancy  Past Surgical History  Procedure Laterality Date  . Cholecystectomy  03/2006  . Lithotripsy    . Cesarean section    . Tonsillectomy and adenoidectomy     Social History:   Social History   Social History  . Marital Status: Married    Spouse Name: N/A  . Number of Children: 1  . Years of Education: N/A    Occupational History  . Home maker    Social History Main Topics  . Smoking status: Never Smoker   . Smokeless tobacco: Never Used  . Alcohol Use: 0.6 oz/week    1 Shots of liquor, 0 Glasses of wine, 0 Cans of beer per week     Comment: occasional < 1 month  . Drug Use: No  . Sexual Activity: Yes    Birth Control/ Protection: None   Other Topics Concern  . None   Social History Narrative   1 child, 2 step-sons      Regular exercise-no   Diet: no fast food, likes sweets   Additional Social History:  Married X 11 years. Has  Two sons ages  34 and 44.  Has 2 step sons ages 38 and 50. 22 yo is in Hotel manager and is married. 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  Husband is a retired Administrator, [employer] and has PTSD.    Musculoskeletal: Strength & Muscle Tone: within normal limits Gait & Station: normal Patient leans: N/A  Psychiatric Specialty Exam: HPI   Review of Systems  Constitutional: Positive for weight loss and malaise/fatigue.  Eyes: Negative.   Respiratory: Negative.   Cardiovascular: Negative for palpitations and leg swelling.  Gastrointestinal: Negative.   Genitourinary: Negative.   Musculoskeletal: Negative for back pain, joint pain and neck pain.  Skin: Negative for rash.  Neurological: Negative.   Psychiatric/Behavioral: Negative for depression and suicidal ideas. The patient is nervous/anxious. The patient does not have insomnia.     There were no vitals taken for this visit.There is no weight on file to calculate BMI.  General Appearance: Casual  Eye Contact:  Fair  Speech:  Clear and Coherent  Volume:  Normal  Mood:  Euthymic  Affect:  Congruent  Thought Process:  Coherent  Orientation:  Full (Time, Place, and Person)  Thought Content:  WDL  Suicidal Thoughts:  No  Homicidal Thoughts:  No  Memory:  Immediate;   Fair  Judgement:  Fair  Insight:  Fair  Psychomotor Activity:  Normal  Concentration:  Fair  Recall:  [employer] of Knowledge:Fair  Language: Fair   Akathisia:  No  Handed:  Right  AIMS (if indicated):    Assets:  Communication Skills Desire for Improvement Social Support  ADL's:  Intact  Cognition: WNL  Sleep:  Difficulty going to sleep   Is the patient at risk to self?  No. Has the patient been a risk to self in the past 6 months?  No. Has the patient been a risk to self within the distant past?  No. Is the patient a risk to others?  No. Has the patient been a risk to others in the past 6 months?  No. Has the patient been a risk to others within the distant past?  No.  Allergies:  No Known Allergies Current Medications: Current Outpatient Prescriptions  Medication Sig Dispense Refill  . ARIPiprazole (ABILIFY) 20 MG tablet Take 1 tablet (20 mg total) by mouth daily. 90 tablet 1  . lamoTRIgine (LAMICTAL) 25 MG tablet Take 2 tablets (50 mg  total) by mouth daily. 180 tablet 1  . meloxicam (MOBIC) 15 MG tablet Take 15 mg by mouth daily.  0  . NORA-BE 0.35 MG tablet Take 1 tablet by mouth daily.  0  . ramelteon (ROZEREM) 8 MG tablet Take 1 tablet (8 mg total) by mouth at bedtime. 30 tablet 0  . SAVELLA 50 MG TABS tablet   0  . topiramate (TOPAMAX) 100 MG tablet Take 1 tablet (100 mg total) by mouth daily. 90 tablet 1  . topiramate (TOPAMAX) 50 MG tablet Take 1 tablet (50 mg total) by mouth daily. 90 tablet 1  . levonorgestrel (MIRENA) 20 MCG/24HR IUD 1 each by Intrauterine route once.     No current facility-administered medications for this visit.      Substance Abuse History in the last 12 months:  No.  Consequences of Substance Abuse: NA  Medical Decision Making:  Review of Psycho-Social Stressors (1) and Established Problem, Worsening (2)  Treatment Plan Summary: Medication management   Depression Patient will continue on Abilify 20 mg on alternate days and she will take it on Monday Wednesday and Friday.  Mood stabilization She will continue on lamotrigine 50 mg daily  Binge eating disorder I will decrease  the dose of Topamax to 100 mg daily and the  patient has enough supply of the medications.  She will call if she will run out of her medications before the next appointment   More than 50% of the time spent in psychoeducation, counseling and coordination of care.  Time spent with the patient 25 minutes   This note was generated in part or whole with voice recognition software. Voice regonition is usually quite accurate but there are transcription errors that can and very often do occur. I apologize for any typographical errors that were not detected and corrected.     Brandy HaleUzma Eileene Kisling 12/1/20169:39 AM

## 2015-03-17 ENCOUNTER — Ambulatory Visit (INDEPENDENT_AMBULATORY_CARE_PROVIDER_SITE_OTHER): Admitting: Psychiatry

## 2015-03-17 ENCOUNTER — Encounter: Payer: Self-pay | Admitting: Psychiatry

## 2015-03-17 VITALS — BP 124/86 | HR 120 | Temp 97.5°F | Ht 64.0 in | Wt >= 6400 oz

## 2015-03-17 DIAGNOSIS — F411 Generalized anxiety disorder: Secondary | ICD-10-CM

## 2015-03-17 DIAGNOSIS — F3160 Bipolar disorder, current episode mixed, unspecified: Secondary | ICD-10-CM

## 2015-03-17 MED ORDER — TOPIRAMATE 100 MG PO TABS: 100.0000 mg | ORAL_TABLET | Freq: Every day | ORAL | Status: DC

## 2015-03-17 MED ORDER — LAMOTRIGINE 100 MG PO TABS: 100.0000 mg | ORAL_TABLET | Freq: Every day | ORAL | Status: DC

## 2015-03-17 MED ORDER — ARIPIPRAZOLE 20 MG PO TABS: 20.0000 mg | ORAL_TABLET | Freq: Every day | ORAL | Status: DC

## 2015-03-17 MED ORDER — TOPIRAMATE 50 MG PO TABS: 50.0000 mg | ORAL_TABLET | Freq: Every day | ORAL | Status: DC

## 2015-03-17 NOTE — Progress Notes (Signed)
Psychiatric Follow Up Notes.   Patient Identification: Sierra [name] MRN:  [id number] Date of Evaluation:  03/17/2015 [facility] Chief Complaint:   Chief Complaint    Follow-up; Medication Refill; Other     Visit Diagnosis:  Bipolar Disorder, Mixed.  Binge Eating Disorder   Diagnosis:   Patient Active Problem List   Diagnosis Date Noted  . Polydipsia [R63.1] 03/23/2014  . Binge eating disorder [F50.81] 03/23/2014  . Vaginitis and vulvovaginitis [N76.0] 12/31/2013  . Paronychia [L03.019] 09/21/2013  . Essential hypertension, benign [I10] 06/05/2013  . Bilateral leg pain [M79.604, M79.605] 05/22/2013  . Right carpal tunnel syndrome [G56.01] 09/16/2012  . General counseling and advice on female contraception [Z30.09] 01/22/2012  . Anhydramnios [O41.00X0] 01/02/2012  . Umbilical cord complication [O69.9XX0] 12/07/2011  . High-risk pregnancy [O09.90] 11/09/2011  . Affective bipolar disorder (HCC) [F31.9] 10/12/2011  . H/O cesarean section [Z98.891] 10/12/2011  . Calculus of kidney [N20.0] 10/12/2011  . Obesity affecting pregnancy, antepartum [O99.210, E66.9] 10/12/2011  . Bipolar affective disorder (HCC) [F31.9] 10/12/2011  . History of cesarean section [Z98.891] 10/12/2011  . SNORING [R06.09, R09.89] 07/23/2007  . EDEMA [R60.9] 06/11/2007  . MORBID OBESITY [E66.01] 12/23/2006  . BIPOLAR AFFECTIVE DISORDER [F31.9] 12/23/2006  . ANXIETY [F41.1] 12/23/2006  . DEPRESSION [F32.9] 12/23/2006  . GERD [K21.9] 12/23/2006  . MILD/UNSPEC PRE-ECLAMPSIA UNSPEC AS EPISODE CARE [IMO0002] 12/23/2006  . Fibromyalgia [M79.7] 12/23/2006   History of Present Illness:    Pt is a 35 year old morbidly obese female who presented for  follow-up appointment. She reported that she is feeling extremely agitated and upset this morning. She reported that she is having issues at her home as her other daughter-in-law has also moved into the house and her son will be  leaving for  [place] any time. She reported that the daughter-in-law is staying at home for approximately one year. She reported that her other daughter-in-law is due and will be having their first baby. She reported that she is also having relationship issues with her mother who also lives in the same household. Patient discussed at length about the relationship issues with her family members. She reported that she has started emotional eating and has gained weight since her last appointment. She was concerned about the same and wants to titrate the dose of Topamax again. She reported that she was diagnosed with Raynaud's  disease and was started on amlodipine which has really helped as she was having numbness in her hands and it was very cold. She has felt better and she is interested in having her medications adjusted. She appeared calm during the interview. She reported that the medications are helping and she is trying to start exercising again. She currently denied using any drugs or alcohol. She denied having any perceptual disturbances. She is receptive to medications changes at this time.     Past Medical History:  Past Medical History  Diagnosis Date  . Anxiety state, unspecified   . Bipolar disorder, unspecified (HCC)   . Depressive disorder, not elsewhere classified   . Edema   . Myalgia and myositis, unspecified   . Abdominal pain, other specified site   . Esophageal reflux   . Mild or unspecified pre-eclampsia, unspecified as to episode of care   . Morbid obesity (HCC)   . Other dyspnea and respiratory abnormality   . Urinary tract infection, site not specified   . History of kidney stones   . Preeclampsia     with first pregnancy  Past Surgical History  Procedure Laterality Date  . Cholecystectomy  03/2006  . Lithotripsy    . Cesarean section    . Tonsillectomy and adenoidectomy     Social History:   Social History   Social History  . Marital Status: Married    Spouse  Name: N/A  . Number of Children: 1  . Years of Education: N/A   Occupational History  . Home maker    Social History Main Topics  . Smoking status: Never Smoker   . Smokeless tobacco: Never Used  . Alcohol Use: 0.0 - 0.6 oz/week    0 Glasses of wine, 0 Cans of beer, 0-1 Shots of liquor per week     Comment: occasional < 1 month  . Drug Use: No  . Sexual Activity: Yes    Birth Control/ Protection: None   Other Topics Concern  . None   Social History Narrative   1 child, 2 step-sons      Regular exercise-no   Diet: no fast food, likes sweets   Additional Social History:  Married X 11 years. Has  Two sons ages  35 and 4.  Has 2 step sons ages 16 and 45. 37 yo is in Hotel manager and is married. 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  Husband is a retired Administrator, [employer] and has PTSD.    Musculoskeletal: Strength & Muscle Tone: within normal limits Gait & Station: normal Patient leans: N/A  Psychiatric Specialty Exam: HPI   Review of Systems  Constitutional: Positive for weight loss and malaise/fatigue.  Eyes: Negative.   Respiratory: Negative.   Cardiovascular: Negative for palpitations and leg swelling.  Gastrointestinal: Negative.   Genitourinary: Negative.   Musculoskeletal: Negative for back pain, joint pain and neck pain.  Skin: Negative for rash.  Neurological: Negative.   Psychiatric/Behavioral: Negative for depression and suicidal ideas. The patient is nervous/anxious. The patient does not have insomnia.     Blood pressure 124/86, pulse 120, temperature 97.5 F (36.4 C), temperature source Tympanic, height  (1.626 m), weight 406 lb 6.4 oz (184.342 kg), last menstrual period 03/01/2015, SpO2 96 %.Body mass index is 69.72 kg/(m^2).  General Appearance: Casual  Eye Contact:  Fair  Speech:  Clear and Coherent  Volume:  Normal  Mood:  Euthymic  Affect:  Congruent  Thought Process:  Coherent  Orientation:  Full (Time, Place, and Person)  Thought Content:  WDL  Suicidal  Thoughts:  No  Homicidal Thoughts:  No  Memory:  Immediate;   Fair  Judgement:  Fair  Insight:  Fair  Psychomotor Activity:  Normal  Concentration:  Fair  Recall:  [employer] of Knowledge:Fair  Language: Fair  Akathisia:  No  Handed:  Right  AIMS (if indicated):    Assets:  Communication Skills Desire for Improvement Social Support  ADL's:  Intact  Cognition: WNL  Sleep:  Difficulty going to sleep   Is the patient at risk to self?  No. Has the patient been a risk to self in the past 6 months?  No. Has the patient been a risk to self within the distant past?  No. Is the patient a risk to others?  No. Has the patient been a risk to others in the past 6 months?  No. Has the patient been a risk to others within the distant past?  No.  Allergies:  No Known Allergies Current Medications: Current Outpatient Prescriptions  Medication Sig Dispense Refill  . amLODipine (NORVASC) 5 MG tablet Take  5 mg by mouth daily.  3  . ARIPiprazole (ABILIFY) 20 MG tablet Take 1 tablet (20 mg total) by mouth daily. 90 tablet 1  . lamoTRIgine (LAMICTAL) 25 MG tablet Take 2 tablets (50 mg total) by mouth daily. 180 tablet 1  . meloxicam (MOBIC) 15 MG tablet Take 15 mg by mouth daily.  0  . NORA-BE 0.35 MG tablet Take 1 tablet by mouth daily.  0  . SAVELLA 50 MG TABS tablet   0  . topiramate (TOPAMAX) 100 MG tablet Take 1 tablet (100 mg total) by mouth daily. 90 tablet 1   No current facility-administered medications for this visit.      Substance Abuse History in the last 12 months:  No.  Consequences of Substance Abuse: NA  Medical Decision Making:  Review of Psycho-Social Stressors (1) and Established Problem, Worsening (2)  Treatment Plan Summary: Medication management   Depression Patient will continue on Abilify 20 mg on alternate days and she will take it on Monday Wednesday and Friday.  Mood stabilization I will titrate lamotrigine 100 mg daily  Binge eating disorder I will  increase  the dose of Topamax to 150 mg daily      More than 50% of the time spent in psychoeducation, counseling and coordination of care.  Time spent with the patient 25 minutes   This note was generated in part or whole with voice recognition software. Voice regonition is usually quite accurate but there are transcription errors that can and very often do occur. I apologize for any typographical errors that were not detected and corrected.    Brandy Hale, MD  3/2/20179:52 AM

## 2015-04-27 ENCOUNTER — Ambulatory Visit (INDEPENDENT_AMBULATORY_CARE_PROVIDER_SITE_OTHER): Admitting: Psychiatry

## 2015-04-27 ENCOUNTER — Encounter: Payer: Self-pay | Admitting: Psychiatry

## 2015-04-27 VITALS — BP 132/86 | HR 122 | Temp 99.6°F | Wt >= 6400 oz

## 2015-04-27 DIAGNOSIS — F3160 Bipolar disorder, current episode mixed, unspecified: Secondary | ICD-10-CM

## 2015-04-27 MED ORDER — TOPIRAMATE 200 MG PO TABS: 200.0000 mg | ORAL_TABLET | Freq: Every day | ORAL | Status: DC

## 2015-04-27 NOTE — Progress Notes (Signed)
Psychiatric Follow Up Notes.   Patient Identification: Sierra [name] MRN:  [id number] Date of Evaluation:  04/27/2015 [facility] Chief Complaint:   Chief Complaint    Follow-up; Medication Refill     Visit Diagnosis:  Bipolar Disorder, Mixed.  Binge Eating Disorder   Diagnosis:   Patient Active Problem List   Diagnosis Date Noted  . Polydipsia [R63.1] 03/23/2014  . Binge eating disorder [F50.81] 03/23/2014  . Vaginitis and vulvovaginitis [N76.0] 12/31/2013  . Paronychia [L03.019] 09/21/2013  . Essential hypertension, benign [I10] 06/05/2013  . Bilateral leg pain [M79.604, M79.605] 05/22/2013  . Right carpal tunnel syndrome [G56.01] 09/16/2012  . General counseling and advice on female contraception [Z30.09] 01/22/2012  . Anhydramnios [O41.00X0] 01/02/2012  . Umbilical cord complication [O69.9XX0] 12/07/2011  . High-risk pregnancy [O09.90] 11/09/2011  . Affective bipolar disorder (HCC) [F31.9] 10/12/2011  . H/O cesarean section [Z98.891] 10/12/2011  . Calculus of kidney [N20.0] 10/12/2011  . Obesity affecting pregnancy, antepartum [O99.210, E66.9] 10/12/2011  . Bipolar affective disorder (HCC) [F31.9] 10/12/2011  . History of cesarean section [Z98.891] 10/12/2011  . SNORING [R06.09, R09.89] 07/23/2007  . EDEMA [R60.9] 06/11/2007  . MORBID OBESITY [E66.01] 12/23/2006  . BIPOLAR AFFECTIVE DISORDER [F31.9] 12/23/2006  . ANXIETY [F41.1] 12/23/2006  . DEPRESSION [F32.9] 12/23/2006  . GERD [K21.9] 12/23/2006  . MILD/UNSPEC PRE-ECLAMPSIA UNSPEC AS EPISODE CARE [IMO0002] 12/23/2006  . Fibromyalgia [M79.7] 12/23/2006   History of Present Illness:    Pt is a 35 year old morbidly obese female who presented for  follow-up appointment. She reported that she is feeling Anxious and worried about her Daughter-in-law who is 26 years old. She reported that her daughter-in-law has history of bipolar disorder but she is noncompliant with medications. She  sleeps most of the time. She reported that she does not take care of herself. She is trying to help her. Patient reported that she has been compliant with her medications and is doing well. She is also following with a therapist on a regular basis.  Hypertension wants to ask if she has been diagnosed with major eating disorder. Patient reported that she was losing weight but due to stress she has been eating more. She is interested in titrating the dose of Topamax at this time. She currently denied having any adverse effects which to the medication. She appeared very calm and collected during the interview. She reported that her mood symptoms are improving and she is not having any adverse effects of the medications. She denied having any suicidal ideations or plans.She denied having any perceptual disturbances. She is receptive to medications changes at this time.     Past Medical History:  Past Medical History  Diagnosis Date  . Anxiety state, unspecified   . Bipolar disorder, unspecified (HCC)   . Depressive disorder, not elsewhere classified   . Edema   . Myalgia and myositis, unspecified   . Abdominal pain, other specified site   . Esophageal reflux   . Mild or unspecified pre-eclampsia, unspecified as to episode of care   . Morbid obesity (HCC)   . Other dyspnea and respiratory abnormality   . Urinary tract infection, site not specified   . History of kidney stones   . Preeclampsia     with first pregnancy    Past Surgical History  Procedure Laterality Date  . Cholecystectomy  03/2006  . Lithotripsy    . Cesarean section    . Tonsillectomy and adenoidectomy     Social History:   Social History  Social History  . Marital Status: Married    Spouse Name: N/A  . Number of Children: 1  . Years of Education: N/A   Occupational History  . Home maker    Social History Main Topics  . Smoking status: Never Smoker   . Smokeless tobacco: Never Used  . Alcohol Use: 0.0 - 0.6  oz/week    0 Glasses of wine, 0 Cans of beer, 0-1 Shots of liquor per week     Comment: occasional < 1 month  . Drug Use: No  . Sexual Activity: Yes    Birth Control/ Protection: None   Other Topics Concern  . None   Social History Narrative   1 child, 2 step-sons      Regular exercise-no   Diet: no fast food, likes sweets   Additional Social History:  Married X 11 years. Has  Two sons ages  72 and 39.  Has 2 step sons ages 3 and 44. 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  Husband is a retired Administrator, [employer] and has PTSD.    Musculoskeletal: Strength & Muscle Tone: within normal limits Gait & Station: normal Patient leans: N/A  Psychiatric Specialty Exam: HPI  Review of Systems  Constitutional: Positive for weight loss and malaise/fatigue.  Eyes: Negative.   Respiratory: Negative.   Cardiovascular: Negative for palpitations and leg swelling.  Gastrointestinal: Negative.   Genitourinary: Negative.   Musculoskeletal: Negative for back pain, joint pain and neck pain.  Skin: Negative for rash.  Neurological: Negative.   Psychiatric/Behavioral: Negative for depression and suicidal ideas. The patient is nervous/anxious. The patient does not have insomnia.     Blood pressure 132/86, pulse 122, temperature 99.6 F (37.6 C), temperature source Tympanic, weight 408 lb 6.4 oz (185.249 kg), last menstrual period 04/20/2015, SpO2 99 %.Body mass index is 70.07 kg/(m^2).  General Appearance: Casual  Eye Contact:  Fair  Speech:  Clear and Coherent  Volume:  Normal  Mood:  Euthymic  Affect:  Congruent  Thought Process:  Coherent  Orientation:  Full (Time, Place, and Person)  Thought Content:  WDL  Suicidal Thoughts:  No  Homicidal Thoughts:  No  Memory:  Immediate;   Fair  Judgement:  Fair  Insight:  Fair  Psychomotor Activity:  Normal  Concentration:  Fair  Recall:  [employer] of Knowledge:Fair  Language: Fair  Akathisia:  No  Handed:  Right  AIMS (if  indicated):    Assets:  Communication Skills Desire for Improvement Social Support  ADL's:  Intact  Cognition: WNL  Sleep:  Difficulty going to sleep   Is the patient at risk to self?  No. Has the patient been a risk to self in the past 6 months?  No. Has the patient been a risk to self within the distant past?  No. Is the patient a risk to others?  No. Has the patient been a risk to others in the past 6 months?  No. Has the patient been a risk to others within the distant past?  No.  Allergies:  No Known Allergies Current Medications: Current Outpatient Prescriptions  Medication Sig Dispense Refill  . amLODipine (NORVASC) 5 MG tablet Take 5 mg by mouth daily.  3  . ARIPiprazole (ABILIFY) 20 MG tablet Take 1 tablet (20 mg total) by mouth daily. 90 tablet 1  . lamoTRIgine (LAMICTAL) 100 MG tablet Take 1 tablet (100 mg total) by mouth daily. 90 tablet 1  .  meloxicam (MOBIC) 15 MG tablet Take 15 mg by mouth daily.  0  . mometasone (ELOCON) 0.1 % cream APPLY TOPICALLY ONCE DAILY AS DIRECTED  0  . NORA-BE 0.35 MG tablet Take 1 tablet by mouth daily.  0  . SAVELLA 50 MG TABS tablet   0  . topiramate (TOPAMAX) 100 MG tablet Take 1 tablet (100 mg total) by mouth daily. 90 tablet 1  . topiramate (TOPAMAX) 50 MG tablet Take 1 tablet (50 mg total) by mouth daily. 90 tablet 1   No current facility-administered medications for this visit.      Substance Abuse History in the last 12 months:  No.  Consequences of Substance Abuse: NA  Medical Decision Making:  Review of Psycho-Social Stressors (1) and Established Problem, Worsening (2)  Treatment Plan Summary: Medication management   Depression Patient will continue on Abilify 20 mg on alternate days and she will take it on Monday Wednesday and Friday.  Mood stabilization Continue  lamotrigine 100mg  daily  Binge eating disorder I will increase  the dose of Topamax to 200  mg daily   Discussed with her about the side effects of  medications and she demonstrated understanding     More than 50% of the time spent in psychoeducation, counseling and coordination of care.  Time spent with the patient 25 minutes   This note was generated in part or whole with voice recognition software. Voice regonition is usually quite accurate but there are transcription errors that can and very often do occur. I apologize for any typographical errors that were not detected and corrected.    Brandy HaleUzma Roylee Chaffin, MD  4/12/201710:03 AM

## 2015-06-08 ENCOUNTER — Ambulatory Visit (INDEPENDENT_AMBULATORY_CARE_PROVIDER_SITE_OTHER): Admitting: Psychiatry

## 2015-06-08 ENCOUNTER — Encounter: Payer: Self-pay | Admitting: Psychiatry

## 2015-06-08 VITALS — BP 138/86 | HR 138 | Temp 98.9°F | Ht 64.0 in | Wt 395.6 lb

## 2015-06-08 DIAGNOSIS — F3130 Bipolar disorder, current episode depressed, mild or moderate severity, unspecified: Secondary | ICD-10-CM

## 2015-06-08 MED ORDER — TOPIRAMATE 200 MG PO TABS: 200.0000 mg | ORAL_TABLET | Freq: Every day | ORAL | Status: DC

## 2015-06-08 MED ORDER — ARIPIPRAZOLE 20 MG PO TABS: 20.0000 mg | ORAL_TABLET | Freq: Every day | ORAL | Status: DC

## 2015-06-08 MED ORDER — LAMOTRIGINE 150 MG PO TABS: 150.0000 mg | ORAL_TABLET | Freq: Every day | ORAL | Status: DC

## 2015-06-08 NOTE — Progress Notes (Signed)
Psychiatric Follow Up Notes.   Patient Identification: Sierra [name] MRN:  [id number] Date of Evaluation:  06/08/2015 [facility] Chief Complaint:   Chief Complaint    Follow-up; Medication Refill     Visit Diagnosis:  Bipolar Disorder, Mixed.  Binge Eating Disorder   Diagnosis:   Patient Active Problem List   Diagnosis Date Noted  . Polydipsia [R63.1] 03/23/2014  . Binge eating disorder [F50.81] 03/23/2014  . Vaginitis and vulvovaginitis [N76.0] 12/31/2013  . Paronychia [L03.019] 09/21/2013  . Essential hypertension, benign [I10] 06/05/2013  . Bilateral leg pain [M79.604, M79.605] 05/22/2013  . Right carpal tunnel syndrome [G56.01] 09/16/2012  . General counseling and advice on female contraception [Z30.09] 01/22/2012  . Anhydramnios [O41.00X0] 01/02/2012  . Umbilical cord complication [O69.9XX0] 12/07/2011  . High-risk pregnancy [O09.90] 11/09/2011  . Affective bipolar disorder (HCC) [F31.9] 10/12/2011  . H/O cesarean section [Z98.891] 10/12/2011  . Calculus of kidney [N20.0] 10/12/2011  . Obesity affecting pregnancy, antepartum [O99.210, E66.9] 10/12/2011  . Bipolar affective disorder (HCC) [F31.9] 10/12/2011  . History of cesarean section [Z98.891] 10/12/2011  . SNORING [R06.09, R09.89] 07/23/2007  . EDEMA [R60.9] 06/11/2007  . MORBID OBESITY [E66.01] 12/23/2006  . BIPOLAR AFFECTIVE DISORDER [F31.9] 12/23/2006  . ANXIETY [F41.1] 12/23/2006  . DEPRESSION [F32.9] 12/23/2006  . GERD [K21.9] 12/23/2006  . MILD/UNSPEC PRE-ECLAMPSIA UNSPEC AS EPISODE CARE [IMO0002] 12/23/2006  . Fibromyalgia [M79.7] 12/23/2006   History of Present Illness:    Pt is a 35 year old morbidly obese female who presented for  follow-up appointment. She reported that she is feeling Anxious and Is thinking about getting a separation from her husband. She reported that her husband does not do anything and is just holding the telephone in his hand for the entire  day. She reported that he has history of PTSD and is going to the clinic and getting therapy. She reported that she just frustrated due to his behavior. Patient reported that she went to the beach with her family and enjoyed and does not want to return back home. She stated that she has discussed the situation with her therapist and she supported her decision about separation. We discussed about her family issues in detail. Patient is excited about losing weight and reported that the medications are helping her. She currently denied having any suicidal homicidal ideations or plans. She denied having any perceptual disturbances.         Past Medical History:  Past Medical History  Diagnosis Date  . Anxiety state, unspecified   . Bipolar disorder, unspecified (HCC)   . Depressive disorder, not elsewhere classified   . Edema   . Myalgia and myositis, unspecified   . Abdominal pain, other specified site   . Esophageal reflux   . Mild or unspecified pre-eclampsia, unspecified as to episode of care   . Morbid obesity (HCC)   . Other dyspnea and respiratory abnormality   . Urinary tract infection, site not specified   . History of kidney stones   . Preeclampsia     with first pregnancy    Past Surgical History  Procedure Laterality Date  . Cholecystectomy  03/2006  . Lithotripsy    . Cesarean section    . Tonsillectomy and adenoidectomy     Social History:   Social History   Social History  . Marital Status: Married    Spouse Name: N/A  . Number of Children: 1  . Years of Education: N/A   Occupational History  . Home maker  Social History Main Topics  . Smoking status: Never Smoker   . Smokeless tobacco: Never Used  . Alcohol Use: 0.0 - 0.6 oz/week    0 Glasses of wine, 0 Cans of beer, 0-1 Shots of liquor per week     Comment: occasional < 1 month  . Drug Use: No  . Sexual Activity: Yes    Birth Control/ Protection: None   Other Topics Concern  . None   Social  History Narrative   1 child, 2 step-sons      Regular exercise-no   Diet: no fast food, likes sweets   Additional Social History:  Married X 11 years. Has  Two sons ages  248 and 423.  Has 2 step sons ages 2723 and 2120. 33 yo is in Hotel managermilitary and is married. 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  Husband is a retired Administrator, [employer] and has PTSD.    Musculoskeletal: Strength & Muscle Tone: within normal limits Gait & Station: normal Patient leans: N/A  Psychiatric Specialty Exam: HPI  Review of Systems  Constitutional: Positive for weight loss and malaise/fatigue.  Eyes: Negative.   Respiratory: Negative.   Cardiovascular: Negative for palpitations and leg swelling.  Gastrointestinal: Negative.   Genitourinary: Negative.   Musculoskeletal: Negative for back pain, joint pain and neck pain.  Skin: Negative for rash.  Neurological: Negative.   Psychiatric/Behavioral: Negative for depression and suicidal ideas. The patient is nervous/anxious. The patient does not have insomnia.     Blood pressure 138/86, pulse 138, temperature 98.9 F (37.2 C), temperature source Tympanic, height 5\' 4"  (1.626 m), weight 395 lb 9.6 oz (179.443 kg), last menstrual period 05/20/2015, SpO2 97 %.Body mass index is 67.87 kg/(m^2).  General Appearance: Casual  Eye Contact:  Fair  Speech:  Clear and Coherent  Volume:  Normal  Mood:  Euthymic  Affect:  Congruent  Thought Process:  Coherent  Orientation:  Full (Time, Place, and Person)  Thought Content:  WDL  Suicidal Thoughts:  No  Homicidal Thoughts:  No  Memory:  Immediate;   Fair  Judgement:  Fair  Insight:  Fair  Psychomotor Activity:  Normal  Concentration:  Fair  Recall:  [employer]  Fund of Knowledge:Fair  Language: Fair  Akathisia:  No  Handed:  Right  AIMS (if indicated):    Assets:  Communication Skills Desire for Improvement Social Support  ADL's:  Intact  Cognition: WNL  Sleep:  Difficulty going to sleep   Is the patient at risk to self?  No. Has the  patient been a risk to self in the past 6 months?  No. Has the patient been a risk to self within the distant past?  No. Is the patient a risk to others?  No. Has the patient been a risk to others in the past 6 months?  No. Has the patient been a risk to others within the distant past?  No.  Allergies:  No Known Allergies Current Medications: Current Outpatient Prescriptions  Medication Sig Dispense Refill  . amLODipine (NORVASC) 5 MG tablet Take 5 mg by mouth daily.  3  . ARIPiprazole (ABILIFY) 20 MG tablet Take 1 tablet (20 mg total) by mouth daily. 90 tablet 1  . lamoTRIgine (LAMICTAL) 100 MG tablet Take 1 tablet (100 mg total) by mouth daily. 90 tablet 1  . mometasone (ELOCON) 0.1 % cream APPLY TOPICALLY ONCE DAILY AS DIRECTED  0  . NORA-BE 0.35 MG tablet Take 1 tablet by mouth daily.  0  . SAVELLA  50 MG TABS tablet Take 100 mg by mouth 2 (two) times daily.  0  . topiramate (TOPAMAX) 200 MG tablet Take 1 tablet (200 mg total) by mouth daily. 30 tablet 1   No current facility-administered medications for this visit.      Substance Abuse History in the last 12 months:  No.  Consequences of Substance Abuse: NA  Medical Decision Making:  Review of Psycho-Social Stressors (1) and Established Problem, Worsening (2)  Treatment Plan Summary: Medication management   Depression Patient will continue on Abilify 20 mg on alternate days and she will take it on Monday Wednesday and Friday.  Mood stabilization Continue  lamotrigine 150 mg daily  Binge eating disorder I will increase  the dose of Topamax to 200  mg daily   Discussed with her about the side effects of medications and she demonstrated understanding     More than 50% of the time spent in psychoeducation, counseling and coordination of care.  Time spent with the patient 25 minutes   This note was generated in part or whole with voice recognition software. Voice regonition is usually quite accurate but there are  transcription errors that can and very often do occur. I apologize for any typographical errors that were not detected and corrected.    Brandy Hale, MD  5/24/201710:02 AM

## 2015-07-06 ENCOUNTER — Ambulatory Visit (INDEPENDENT_AMBULATORY_CARE_PROVIDER_SITE_OTHER): Admitting: Psychiatry

## 2015-07-06 ENCOUNTER — Encounter: Payer: Self-pay | Admitting: Psychiatry

## 2015-07-06 VITALS — BP 118/82 | HR 107 | Temp 98.8°F | Ht 64.0 in | Wt 399.4 lb

## 2015-07-06 DIAGNOSIS — F411 Generalized anxiety disorder: Secondary | ICD-10-CM

## 2015-07-06 DIAGNOSIS — F3160 Bipolar disorder, current episode mixed, unspecified: Secondary | ICD-10-CM | POA: Diagnosis not present

## 2015-07-06 NOTE — Progress Notes (Signed)
Psychiatric Follow Up Notes.   Patient Identification: Sierra [name] MRN:  [id number] Date of Evaluation:  07/06/2015 Referral Source[facility] Chief Complaint:   Chief Complaint    Follow-up; Medication Refill     Visit Diagnosis:  Bipolar Disorder, Mixed.  Binge Eating Disorder   Diagnosis:   Patient Active Problem List   Diagnosis Date Noted  . Polydipsia [R63.1] 03/23/2014  . Binge eating disorder [F50.81] 03/23/2014  . Vaginitis and vulvovaginitis [N76.0] 12/31/2013  . Paronychia [L03.019] 09/21/2013  . Essential hypertension, benign [I10] 06/05/2013  . Bilateral leg pain [M79.604, M79.605] 05/22/2013  . Right carpal tunnel syndrome [G56.01] 09/16/2012  . General counseling and advice on female contraception [Z30.09] 01/22/2012  . Anhydramnios [O41.00X0] 01/02/2012  . Umbilical cord complication [O69.9XX0] 12/07/2011  . High-risk pregnancy [O09.90] 11/09/2011  . Affective bipolar disorder (HCC) [F31.9] 10/12/2011  . H/O cesarean section [Z98.891] 10/12/2011  . Calculus of kidney [N20.0] 10/12/2011  . Obesity affecting pregnancy, antepartum [O99.210, E66.9] 10/12/2011  . Bipolar affective disorder (HCC) [F31.9] 10/12/2011  . History of cesarean section [Z98.891] 10/12/2011  . SNORING [R06.09, R09.89] 07/23/2007  . EDEMA [R60.9] 06/11/2007  . MORBID OBESITY [E66.01] 12/23/2006  . BIPOLAR AFFECTIVE DISORDER [F31.9] 12/23/2006  . ANXIETY [F41.1] 12/23/2006  . DEPRESSION [F32.9] 12/23/2006  . GERD [K21.9] 12/23/2006  . MILD/UNSPEC PRE-ECLAMPSIA UNSPEC AS EPISODE CARE [IMO0002] 12/23/2006  . Fibromyalgia [M79.7] 12/23/2006   History of Present Illness:    Pt is a 35 year old morbidly obese female who presented for  follow-up appointment. She reported that she is feeling Better as she took my advice and started talking to her husband. She reported that she is deciding against separating from her husband and they have improved their relationship. They  went out for dinner together and she has told him about her plans and he became very upset. Patient reported that now they're going on a weekly denied night and will watch movies together. Patient reported that she is very thankful to me as we have talked about her separation at the last appointment. Patient reported that she has also started going to the gym on a daily basis and is doing exercises. She reported that she is working on her self improvement and is feeling better. She has been compliant with her medications. She currently denied having any suicidal homicidal ideations or plans. She reported that the current combination of medication is really helping her. She reported that she does not have any mood symptoms anger anxiety or paranoia.    Past Medical History:  Past Medical History  Diagnosis Date  . Anxiety state, unspecified   . Bipolar disorder, unspecified (HCC)   . Depressive disorder, not elsewhere classified   . Edema   . Myalgia and myositis, unspecified   . Abdominal pain, other specified site   . Esophageal reflux   . Mild or unspecified pre-eclampsia, unspecified as to episode of care   . Morbid obesity (HCC)   . Other dyspnea and respiratory abnormality   . Urinary tract infection, site not specified   . History of kidney stones   . Preeclampsia     with first pregnancy    Past Surgical History  Procedure Laterality Date  . Cholecystectomy  03/2006  . Lithotripsy    . Cesarean section    . Tonsillectomy and adenoidectomy     Social History:   Social History   Social History  . Marital Status: Married    Spouse Name: N/A  . Number of  Children: 1  . Years of Education: N/A   Occupational History  . Home maker    Social History Main Topics  . Smoking status: Never Smoker   . Smokeless tobacco: Never Used  . Alcohol Use: 0.0 - 0.6 oz/week    0 Glasses of wine, 0 Cans of beer, 0-1 Shots of liquor per week     Comment: occasional < 1 month  . Drug Use:  No  . Sexual Activity: Yes    Birth Control/ Protection: None   Other Topics Concern  . None   Social History Narrative   1 child, 2 step-sons      Regular exercise-no   Diet: no fast food, likes sweets   Additional Social History:  Married X 11 years. Has  Two sons ages  12 and 77.  Has 2 step sons ages 32 and 110. 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  Husband is a retired Administrator, [employer] and has PTSD.    Musculoskeletal: Strength & Muscle Tone: within normal limits Gait & Station: normal Patient leans: N/A  Psychiatric Specialty Exam: HPI  Review of Systems  Constitutional: Positive for weight loss and malaise/fatigue.  Eyes: Negative.   Respiratory: Negative.   Cardiovascular: Negative for palpitations and leg swelling.  Gastrointestinal: Negative.   Genitourinary: Negative.   Musculoskeletal: Negative for back pain, joint pain and neck pain.  Skin: Negative for rash.  Neurological: Negative.   Psychiatric/Behavioral: Negative for depression and suicidal ideas. The patient is nervous/anxious. The patient does not have insomnia.     Blood pressure 118/82, pulse 107, temperature 98.8 F (37.1 C), temperature source Tympanic, height  (1.626 m), weight 399 lb 6.4 oz (181.167 kg), last menstrual period 05/20/2015, SpO2 99 %.Body mass index is 68.52 kg/(m^2).  General Appearance: Casual  Eye Contact:  Fair  Speech:  Clear and Coherent  Volume:  Normal  Mood:  Euthymic  Affect:  Congruent  Thought Process:  Coherent  Orientation:  Full (Time, Place, and Person)  Thought Content:  WDL  Suicidal Thoughts:  No  Homicidal Thoughts:  No  Memory:  Immediate;   Fair  Judgement:  Fair  Insight:  Fair  Psychomotor Activity:  Normal  Concentration:  Fair  Recall:  [employer] of Knowledge:Fair  Language: Fair  Akathisia:  No  Handed:  Right  AIMS (if indicated):    Assets:  Communication Skills Desire for Improvement Social Support  ADL's:  Intact   Cognition: WNL  Sleep:  Difficulty going to sleep   Is the patient at risk to self?  No. Has the patient been a risk to self in the past 6 months?  No. Has the patient been a risk to self within the distant past?  No. Is the patient a risk to others?  No. Has the patient been a risk to others in the past 6 months?  No. Has the patient been a risk to others within the distant past?  No.  Allergies:  No Known Allergies Current Medications: Current Outpatient Prescriptions  Medication Sig Dispense Refill  . amLODipine (NORVASC) 5 MG tablet Take 5 mg by mouth daily.  3  . ARIPiprazole (ABILIFY) 20 MG tablet Take 1 tablet (20 mg total) by mouth daily. 90 tablet 1  . lamoTRIgine (LAMICTAL) 150 MG tablet Take 1 tablet (150 mg total) by mouth daily. 90 tablet 1  . mometasone (ELOCON) 0.1 % cream APPLY TOPICALLY ONCE DAILY AS DIRECTED  0  . NORA-BE 0.35 MG tablet Take 1 tablet by mouth daily.  0  . SAVELLA 50 MG TABS tablet Take 100 mg by mouth 2 (two) times daily.  0  . topiramate (TOPAMAX) 200 MG tablet Take 1 tablet (200 mg total) by mouth daily. 90 tablet 1   No current facility-administered medications for this visit.      Substance Abuse History in the last 12 months:  No.  Consequences of Substance Abuse: NA  Medical Decision Making:  Review of Psycho-Social Stressors (1) and Established Problem, Worsening (2)  Treatment Plan Summary: Medication management   Depression Patient will continue on Abilify 20 mg on alternate days and she will take it on Monday Wednesday and Friday.  Mood stabilization Continue  lamotrigine 150 mg daily  Binge eating disorder Continue  Topamax to 200  mg daily   Patient has supply of all the medications.  Discussed with her about the side effects of medications and she demonstrated understanding  Follow-up in 2 months   More than 50% of the time spent in psychoeducation, counseling and coordination of care.  Time spent with the patient  25 minutes   This note was generated in part or whole with voice recognition software. Voice regonition is usually quite accurate but there are transcription errors that can and very often do occur. I apologize for any typographical errors that were not detected and corrected.    Brandy HaleUzma Fleda Pagel, MD  6/21/201711:11 AM

## 2015-08-23 DIAGNOSIS — H539 Unspecified visual disturbance: Secondary | ICD-10-CM | POA: Insufficient documentation

## 2015-08-29 DIAGNOSIS — R51 Headache: Secondary | ICD-10-CM

## 2015-08-29 DIAGNOSIS — R519 Headache, unspecified: Secondary | ICD-10-CM | POA: Insufficient documentation

## 2015-08-29 DIAGNOSIS — R202 Paresthesia of skin: Secondary | ICD-10-CM | POA: Insufficient documentation

## 2015-08-30 ENCOUNTER — Other Ambulatory Visit: Payer: Self-pay | Admitting: Neurology

## 2015-08-30 DIAGNOSIS — R51 Headache: Principal | ICD-10-CM

## 2015-08-30 DIAGNOSIS — R202 Paresthesia of skin: Secondary | ICD-10-CM

## 2015-08-30 DIAGNOSIS — R519 Headache, unspecified: Secondary | ICD-10-CM

## 2015-08-30 DIAGNOSIS — H539 Unspecified visual disturbance: Secondary | ICD-10-CM

## 2015-09-05 ENCOUNTER — Ambulatory Visit: Admitting: Psychiatry

## 2015-09-06 ENCOUNTER — Encounter: Payer: Self-pay | Admitting: Psychiatry

## 2015-09-06 ENCOUNTER — Ambulatory Visit (INDEPENDENT_AMBULATORY_CARE_PROVIDER_SITE_OTHER): Admitting: Psychiatry

## 2015-09-06 VITALS — BP 136/83 | HR 108 | Temp 98.3°F | Ht 64.0 in | Wt 394.4 lb

## 2015-09-06 DIAGNOSIS — F3160 Bipolar disorder, current episode mixed, unspecified: Secondary | ICD-10-CM

## 2015-09-06 DIAGNOSIS — F411 Generalized anxiety disorder: Secondary | ICD-10-CM | POA: Diagnosis not present

## 2015-09-06 MED ORDER — TOPIRAMATE 200 MG PO TABS: 200.0000 mg | ORAL_TABLET | Freq: Every day | ORAL | 1 refills | Status: DC

## 2015-09-06 MED ORDER — ARIPIPRAZOLE 20 MG PO TABS: 20.0000 mg | ORAL_TABLET | Freq: Every day | ORAL | 1 refills | Status: DC

## 2015-09-06 MED ORDER — LAMOTRIGINE 150 MG PO TABS: 150.0000 mg | ORAL_TABLET | Freq: Every day | ORAL | 1 refills | Status: DC

## 2015-09-06 NOTE — Progress Notes (Signed)
Psychiatric Follow Up Notes.   Patient Identification: Bianney Rockwood MRN:  [id number] Date of Evaluation:  09/06/2015 [facility] Chief Complaint:   Chief Complaint    Follow-up; Medication Refill     Visit Diagnosis:  Bipolar Disorder, Mixed.  Binge Eating Disorder   Diagnosis:   Patient Active Problem List   Diagnosis Date Noted  . Tingling in extremities [R20.2] 08/29/2015  . Acute nonintractable headache [R51] 08/29/2015  . Visual disturbance [H53.9] 08/23/2015  . Polydipsia [R63.1] 03/23/2014  . Binge eating disorder [F50.81] 03/23/2014  . Vaginitis and vulvovaginitis [N76.0] 12/31/2013  . Paronychia [L03.019] 09/21/2013  . Essential hypertension, benign [I10] 06/05/2013  . Bilateral leg pain [M79.604, M79.605] 05/22/2013  . Right carpal tunnel syndrome [G56.01] 09/16/2012  . General counseling and advice on female contraception [Z30.09] 01/22/2012  . Anhydramnios [O41.00X0] 01/02/2012  . Umbilical cord complication [O69.9XX0] 12/07/2011  . High-risk pregnancy [O09.90] 11/09/2011  . Affective bipolar disorder (HCC) [F31.9] 10/12/2011  . H/O cesarean section [Z98.891] 10/12/2011  . Calculus of kidney [N20.0] 10/12/2011  . Obesity affecting pregnancy, antepartum [O99.210, E66.9] 10/12/2011  . Bipolar affective disorder (HCC) [F31.9] 10/12/2011  . History of cesarean section [Z98.891] 10/12/2011  . SNORING [R06.09, R09.89] 07/23/2007  . EDEMA [R60.9] 06/11/2007  . MORBID OBESITY [E66.01] 12/23/2006  . BIPOLAR AFFECTIVE DISORDER [F31.9] 12/23/2006  . ANXIETY [F41.1] 12/23/2006  . DEPRESSION [F32.9] 12/23/2006  . GERD [K21.9] 12/23/2006  . MILD/UNSPEC PRE-ECLAMPSIA UNSPEC AS EPISODE CARE [IMO0002] 12/23/2006  . Fibromyalgia [M79.7] 12/23/2006   History of Present Illness:    Pt is a 35 year old morbidly obese female who presented for  follow-up appointment. She reported that she Was having floating vision and she went to see her  primary care physician. She was referred to the neurologist who is planning to do an MRI on her. Patient reported that she is concerned as she does not know what will be the next step. She is also interested in bariatric surgery. We discussed about the same at length. Patient currently denied having any worsening of her symptoms. She reported that she is interested in having the bariatric surgery as she wants to lose weight. She has already lost 10 pounds in the past 3 months. Patient reported that she is doing better on her medications and has stopped drinking all the soda. Her mood symptoms are improving. She currently denied having any suicidal ideations or plans. She sleeps well at night. She is compliant with her medications.      Past Medical History:  Past Medical History:  Diagnosis Date  . Abdominal pain, other specified site   . Anxiety state, unspecified   . Bipolar disorder, unspecified (HCC)   . Depressive disorder, not elsewhere classified   . Edema   . Esophageal reflux   . History of kidney stones   . Mild or unspecified pre-eclampsia, unspecified as to episode of care   . Morbid obesity (HCC)   . Myalgia and myositis, unspecified   . Other dyspnea and respiratory abnormality   . Preeclampsia    with first pregnancy  . Urinary tract infection, site not specified     Past Surgical History:  Procedure Laterality Date  . CESAREAN SECTION    . CHOLECYSTECTOMY  03/2006  . LITHOTRIPSY    . TONSILLECTOMY AND ADENOIDECTOMY     Social History:   Social History   Social History  . Marital status: Married    Spouse name: N/A  . Number of children: 1  .  Years of education: N/A   Occupational History  . Home maker    Social History Main Topics  . Smoking status: Never Smoker  . Smokeless tobacco: Never Used  . Alcohol use 0.0 - 0.6 oz/week     Comment: occasional < 1 month  . Drug use: No  . Sexual activity: Yes    Birth control/ protection: None   Other Topics  Concern  . None   Social History Narrative   1 child, 2 step-sons      Regular exercise-no   Diet: no fast food, likes sweets   Additional Social History:  Married X 11 years. Has  Two sons ages  248 and 73.  Has 2 step sons ages 3523 and 6620. 33 yo is in Hotel managermilitary and is married. 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  Husband is a retired Administrator, [employer] and has PTSD.    Musculoskeletal: Strength & Muscle Tone: within normal limits Gait & Station: normal Patient leans: N/A  Psychiatric Specialty Exam: HPI  Review of Systems  Constitutional: Positive for malaise/fatigue and weight loss.  Eyes: Negative.   Respiratory: Negative.   Cardiovascular: Negative for palpitations and leg swelling.  Gastrointestinal: Negative.   Genitourinary: Negative.   Musculoskeletal: Negative for back pain, joint pain and neck pain.  Skin: Negative for rash.  Neurological: Negative.   Psychiatric/Behavioral: Negative for depression and suicidal ideas. The patient is nervous/anxious. The patient does not have insomnia.     Blood pressure 136/83, pulse (!) 108, temperature 98.3 F (36.8 C), temperature source Oral, height 5\' 4"  (1.626 m), weight (!) 394 lb 6.4 oz (178.9 kg).Body mass index is 67.7 kg/m.  General Appearance: Casual  Eye Contact:  Fair  Speech:  Clear and Coherent  Volume:  Normal  Mood:  Euthymic  Affect:  Congruent  Thought Process:  Coherent  Orientation:  Full (Time, Place, and Person)  Thought Content:  WDL  Suicidal Thoughts:  No  Homicidal Thoughts:  No  Memory:  Immediate;   Fair  Judgement:  Fair  Insight:  Fair  Psychomotor Activity:  Normal  Concentration:  Fair  Recall:  [employer]  Fund of Knowledge:Fair  Language: Fair  Akathisia:  No  Handed:  Right  AIMS (if indicated):    Assets:  Communication Skills Desire for Improvement Social Support  ADL's:  Intact  Cognition: WNL  Sleep:  Difficulty going to sleep   Is the patient at risk to self?  No. Has the patient been a risk to  self in the past 6 months?  No. Has the patient been a risk to self within the distant past?  No. Is the patient a risk to others?  No. Has the patient been a risk to others in the past 6 months?  No. Has the patient been a risk to others within the distant past?  No.  Allergies:  No Known Allergies Current Medications: Current Outpatient Prescriptions  Medication Sig Dispense Refill  . amLODipine (NORVASC) 5 MG tablet Take 5 mg by mouth daily.  3  . ARIPiprazole (ABILIFY) 20 MG tablet Take 1 tablet (20 mg total) by mouth daily. 90 tablet 1  . lamoTRIgine (LAMICTAL) 150 MG tablet Take 1 tablet (150 mg total) by mouth daily. 90 tablet 1  . mometasone (ELOCON) 0.1 % cream APPLY TOPICALLY ONCE DAILY AS DIRECTED  0  . NORA-BE 0.35 MG tablet Take 1 tablet by mouth daily.  0  . SAVELLA 50 MG TABS tablet Take 100 mg by  mouth 2 (two) times daily.  0  . topiramate (TOPAMAX) 200 MG tablet Take 1 tablet (200 mg total) by mouth daily. 90 tablet 1   No current facility-administered medications for this visit.       Substance Abuse History in the last 12 months:  No.  Consequences of Substance Abuse: NA  Medical Decision Making:  Review of Psycho-Social Stressors (1) and Established Problem, Worsening (2)  Treatment Plan Summary: Medication management   Depression Patient will continue on Abilify 20 mg on alternate days and she will take it on Monday Wednesday and Friday.  Mood stabilization Continue  lamotrigine 150 mg daily  Binge eating disorder Continue  Topamax to 200  mg daily     Discussed with her about the side effects of medications and she demonstrated understanding  Follow-up in 2 months   More than 50% of the time spent in psychoeducation, counseling and coordination of care.  Time spent with the patient 25 minutes   This note was generated in part or whole with voice recognition software. Voice regonition is usually quite accurate but there are transcription errors  that can and very often do occur. I apologize for any typographical errors that were not detected and corrected.    Brandy HaleUzma Takota Cahalan, MD  8/22/20172:58 PM

## 2015-09-09 ENCOUNTER — Ambulatory Visit
Admission: RE | Admit: 2015-09-09 | Discharge: 2015-09-09 | Disposition: A | Discharge status: Home / Self Care | Source: Ambulatory Visit | Attending: Neurology | Admitting: Neurology

## 2015-09-09 DIAGNOSIS — R51 Headache: Principal | ICD-10-CM

## 2015-09-09 DIAGNOSIS — R519 Headache, unspecified: Secondary | ICD-10-CM

## 2015-09-09 DIAGNOSIS — H539 Unspecified visual disturbance: Secondary | ICD-10-CM

## 2015-09-09 DIAGNOSIS — R202 Paresthesia of skin: Secondary | ICD-10-CM

## 2015-09-14 ENCOUNTER — Other Ambulatory Visit: Payer: Self-pay | Admitting: Neurology

## 2015-09-26 ENCOUNTER — Other Ambulatory Visit: Payer: Self-pay | Admitting: Neurology

## 2015-09-26 DIAGNOSIS — R51 Headache: Principal | ICD-10-CM

## 2015-09-26 DIAGNOSIS — H539 Unspecified visual disturbance: Secondary | ICD-10-CM

## 2015-09-26 DIAGNOSIS — R519 Headache, unspecified: Secondary | ICD-10-CM

## 2015-10-06 ENCOUNTER — Other Ambulatory Visit: Payer: Self-pay | Admitting: Bariatrics

## 2015-10-06 ENCOUNTER — Ambulatory Visit
Admission: RE | Admit: 2015-10-06 | Discharge: 2015-10-06 | Disposition: A | Discharge status: Home / Self Care | Source: Ambulatory Visit | Attending: Neurology | Admitting: Neurology

## 2015-10-06 DIAGNOSIS — R51 Headache: Secondary | ICD-10-CM | POA: Insufficient documentation

## 2015-10-06 DIAGNOSIS — R202 Paresthesia of skin: Secondary | ICD-10-CM | POA: Insufficient documentation

## 2015-10-06 DIAGNOSIS — H539 Unspecified visual disturbance: Secondary | ICD-10-CM | POA: Diagnosis present

## 2015-10-06 DIAGNOSIS — R200 Anesthesia of skin: Secondary | ICD-10-CM | POA: Insufficient documentation

## 2015-10-06 DIAGNOSIS — R519 Headache, unspecified: Secondary | ICD-10-CM

## 2015-10-06 LAB — PREGNANCY, URINE: Preg Test, Ur: NEGATIVE

## 2015-10-06 MED ORDER — ACETAMINOPHEN 325 MG PO TABS
650.0000 mg | ORAL_TABLET | ORAL | Status: DC | PRN
  Filled 2015-10-06: qty 2

## 2015-10-06 MED ORDER — LIDOCAINE HCL (PF) 1 % IJ SOLN
5.0000 mL | Freq: Once | INTRAMUSCULAR | Status: AC
  Administered 2015-10-06: 5 mL
  Filled 2015-10-06: qty 5

## 2015-10-06 NOTE — Discharge Instructions (Signed)
Lumbar Puncture, Care After °Refer to this sheet in the next few weeks. These instructions provide you with information on caring for yourself after your procedure. Your health care provider may also give you more specific instructions. Your treatment has been planned according to current medical practices, but problems sometimes occur. Call your health care provider if you have any problems or questions after your procedure. °WHAT TO EXPECT AFTER THE PROCEDURE °After your procedure, it is typical to have the following sensations: °· Mild discomfort or pain at the insertion site. °· Mild headache that is relieved with pain medicines. °HOME CARE INSTRUCTIONS °· Avoid lifting anything heavier than 10 lb (4.5 kg) for at least 12 hours after the procedure. °· Drink enough fluids to keep your urine clear or pale yellow. °SEEK MEDICAL CARE IF: °· You have fever or chills. °· You have nausea or vomiting. °· You have a headache that lasts for more than 2 days. °SEEK IMMEDIATE MEDICAL CARE IF: °· You have any numbness or tingling in your legs. °· You are unable to control your bowel or bladder. °· You have bleeding or swelling in your back at the insertion site. °· You are dizzy or faint. °  °This information is not intended to replace advice given to you by your health care provider. Make sure you discuss any questions you have with your health care provider. °  °Document Released: 01/06/2013 Document Reviewed: 01/06/2013 °Elsevier Interactive Patient Education ©2016 Elsevier Inc. ° °

## 2015-10-14 ENCOUNTER — Ambulatory Visit
Admission: RE | Admit: 2015-10-14 | Discharge: 2015-10-14 | Disposition: A | Discharge status: Home / Self Care | Source: Ambulatory Visit | Attending: Bariatrics | Admitting: Bariatrics

## 2015-10-14 DIAGNOSIS — K449 Diaphragmatic hernia without obstruction or gangrene: Secondary | ICD-10-CM | POA: Insufficient documentation

## 2015-10-18 ENCOUNTER — Encounter: Payer: Self-pay | Admitting: Psychiatry

## 2015-10-18 ENCOUNTER — Ambulatory Visit (INDEPENDENT_AMBULATORY_CARE_PROVIDER_SITE_OTHER): Admitting: Psychiatry

## 2015-10-18 VITALS — BP 135/79 | HR 86 | Temp 97.8°F | Wt 390.4 lb

## 2015-10-18 DIAGNOSIS — F3160 Bipolar disorder, current episode mixed, unspecified: Secondary | ICD-10-CM | POA: Diagnosis not present

## 2015-10-18 DIAGNOSIS — F411 Generalized anxiety disorder: Secondary | ICD-10-CM

## 2015-10-18 DIAGNOSIS — F5081 Binge eating disorder: Secondary | ICD-10-CM

## 2015-10-18 NOTE — Progress Notes (Signed)
Psychiatric Follow Up Notes.   Patient Identification: Sierra [name] MRN:  [id number] Date of Evaluation:  10/18/2015 Referral Source: [place] Chief Complaint:    Visit Diagnosis:  Bipolar Disorder, Mixed.  Binge Eating Disorder   Diagnosis:   Patient Active Problem List   Diagnosis Date Noted  . Tingling in extremities [R20.2] 08/29/2015  . Acute nonintractable headache [R51] 08/29/2015  . Visual disturbance [H53.9] 08/23/2015  . Polydipsia [R63.1] 03/23/2014  . Binge eating disorder [F50.81] 03/23/2014  . Vaginitis and vulvovaginitis [N76.0] 12/31/2013  . Paronychia [IMO0002] 09/21/2013  . Essential hypertension, benign [I10] 06/05/2013  . Bilateral leg pain [M79.604, M79.605] 05/22/2013  . Right carpal tunnel syndrome [G56.01] 09/16/2012  . General counseling and advice on female contraception [Z30.09] 01/22/2012  . Anhydramnios [O41.00X0] 01/02/2012  . Umbilical cord complication [H41.7EY8] 14/48/1856  . High-risk pregnancy [O09.90] 11/09/2011  . Affective bipolar disorder ([place]) [F31.9] 10/12/2011  . H/O cesarean section [Z98.891] 10/12/2011  . Calculus of kidney [N20.0] 10/12/2011  . Obesity affecting pregnancy, antepartum [O99.210] 10/12/2011  . Bipolar affective disorder ([place]) [F31.9] 10/12/2011  . History of cesarean section [Z98.891] 10/12/2011  . SNORING [R06.09, R09.89] 07/23/2007  . EDEMA [R60.9] 06/11/2007  . MORBID OBESITY [E66.01] 12/23/2006  . BIPOLAR AFFECTIVE DISORDER [F31.9] 12/23/2006  . ANXIETY [F41.1] 12/23/2006  . DEPRESSION [F32.9] 12/23/2006  . GERD [K21.9] 12/23/2006  . MILD/UNSPEC PRE-ECLAMPSIA UNSPEC AS EPISODE CARE [IMO0002] 12/23/2006  . Fibromyalgia [M79.7] 12/23/2006   History of Present Illness:    Pt is a 35 year old morbidly obese female who presented for  follow-up appointment. She reported that she Is going to have her bariatric surgery done next month. She has already met with Dr. Duke Salvia and is very excited. She  is undergoing testing and has met with the surgical team. Patient reported that she is preparing for her upcoming surgery. She will also have her metabolic evaluation done next week. Patient reported that she does not have any acute medical problems and will pass all the testing. She is looking forward to have her surgery. She currently denied having any side effects of the medications and has been compliant with them. She reported that her mother-in-law visited her and she did not have any behavior problems with her. Patient reported that she is excited that she will lose weight after the surgery.   She appeared happy and calm during the interview. She currently denied having any suicidal ideations or plans she denied having any perceptual disturbances. She is sleeping well at night. We discussed about the surgery in detail and she is agreeable. She reported that she wants a letter to be sent to be sent to them and she is comfortable with disclosing her diagnosis and her medications.    Past Medical History:  Past Medical History:  Diagnosis Date  . Abdominal pain, other specified site   . Anxiety state, unspecified   . Bipolar disorder, unspecified   . Depressive disorder, not elsewhere classified   . Edema   . Esophageal reflux   . History of kidney stones   . Mild or unspecified pre-eclampsia, unspecified as to episode of care   . Morbid obesity ([place])   . Myalgia and myositis, unspecified   . Other dyspnea and respiratory abnormality   . Preeclampsia    with first pregnancy  . Urinary tract infection, site not specified     Past Surgical History:  Procedure Laterality Date  . CESAREAN SECTION    . CHOLECYSTECTOMY  03/2006  . LITHOTRIPSY    .  TONSILLECTOMY AND ADENOIDECTOMY     Social History:   Social History   Social History  . Marital status: Married    Spouse name: N/A  . Number of children: 1  . Years of education: N/A   Occupational History  . Home maker    Social  History Main Topics  . Smoking status: Never Smoker  . Smokeless tobacco: Never Used  . Alcohol use 0.0 - 0.6 oz/week     Comment: occasional < 1 month  . Drug use: No  . Sexual activity: Yes    Birth control/ protection: None, IUD   Other Topics Concern  . None   Social History Narrative   1 child, 2 step-sons      Regular exercise-no   Diet: no fast food, likes sweets   Additional Social History:  Married X 11 years. Has  Two sons ages  50 and 48.  Has 2 step sons ages 34 and 30. 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  Husband is a retired Psychologist, clinical and has PTSD.    Musculoskeletal: Strength & Muscle Tone: within normal limits Gait & Station: normal Patient leans: N/A  Psychiatric Specialty Exam: HPI  Review of Systems  Constitutional: Positive for malaise/fatigue and weight loss.  Eyes: Negative.   Respiratory: Negative.   Cardiovascular: Negative for palpitations and leg swelling.  Gastrointestinal: Negative.   Genitourinary: Negative.   Musculoskeletal: Negative for back pain, joint pain and neck pain.  Skin: Negative for rash.  Neurological: Negative.   Psychiatric/Behavioral: Negative for depression and suicidal ideas. The patient is nervous/anxious. The patient does not have insomnia.     Last menstrual period 10/14/2015.There is no height or weight on file to calculate BMI.  General Appearance: Casual  Eye Contact:  Fair  Speech:  Clear and Coherent  Volume:  Normal  Mood:  Euthymic  Affect:  Congruent  Thought Process:  Coherent  Orientation:  Full (Time, Place, and Person)  Thought Content:  WDL  Suicidal Thoughts:  No  Homicidal Thoughts:  No  Memory:  Immediate;   Fair  Judgement:  Fair  Insight:  Fair  Psychomotor Activity:  Normal  Concentration:  Fair  Recall:  [employer] of Knowledge:Fair  Language: Fair  Akathisia:  No  Handed:  Right  AIMS (if indicated):    Assets:  Communication Skills Desire for  Improvement Social Support  ADL's:  Intact  Cognition: WNL  Sleep:  Difficulty going to sleep   Is the patient at risk to self?  No. Has the patient been a risk to self in the past 6 months?  No. Has the patient been a risk to self within the distant past?  No. Is the patient a risk to others?  No. Has the patient been a risk to others in the past 6 months?  No. Has the patient been a risk to others within the distant past?  No.  Allergies:  No Known Allergies Current Medications: Current Outpatient Prescriptions  Medication Sig Dispense Refill  . amLODipine (NORVASC) 5 MG tablet Take 5 mg by mouth daily.  3  . ARIPiprazole (ABILIFY) 20 MG tablet Take 1 tablet (20 mg total) by mouth daily. (Patient taking differently: Take 20 mg by mouth 3 (three) times a week. Monday, wed, friday) 90 tablet 1  . cholecalciferol (VITAMIN D) 1000 units tablet Take 5,000 Units by mouth daily.    Marland Kitchen lamoTRIgine (LAMICTAL) 150 MG tablet Take  1 tablet (150 mg total) by mouth daily. 90 tablet 1  . mometasone (ELOCON) 0.1 % cream APPLY TOPICALLY ONCE DAILY AS DIRECTED  0  . Multiple Vitamin (MULTIVITAMIN) tablet Take 1 tablet by mouth daily.    Marland Kitchen NORA-BE 0.35 MG tablet Take 1 tablet by mouth daily.  0  . PARAGARD INTRAUTERINE COPPER IU by Intrauterine route.    Marland Kitchen SAVELLA 50 MG TABS tablet Take 100 mg by mouth 2 (two) times daily.  0  . topiramate (TOPAMAX) 200 MG tablet Take 1 tablet (200 mg total) by mouth daily. 90 tablet 1  . acetaZOLAMIDE (DIAMOX) 250 MG tablet Take 250 mg by mouth 2 (two) times daily.    . Vitamin D, Ergocalciferol, (DRISDOL) 50000 units CAPS capsule Take 50,000 Units by mouth every 7 (seven) days.     No current facility-administered medications for this visit.       Substance Abuse History in the last 12 months:  No.  Consequences of Substance Abuse: NA  Medical Decision Making:  Review of Psycho-Social Stressors (1) and Established Problem, Worsening (2)  Treatment Plan  Summary: Medication management   Depression Patient will continue on Abilify 20 mg on alternate days and she will take it on Monday Wednesday and Friday.  Mood stabilization Continue  lamotrigine 150 mg daily  Binge eating disorder Continue  Topamax to 200  mg daily    Patient has supply of her medications. No medications dispensed at this time.   Discussed with her about the side effects of medications and she demonstrated understanding  Follow-up in 2 months   More than 50% of the time spent in psychoeducation, counseling and coordination of care.     This note was generated in part or whole with voice recognition software. Voice regonition is usually quite accurate but there are transcription errors that can and very often do occur. I apologize for any typographical errors that were not detected and corrected.    Rainey Pines, MD  10/3/20179:59 AM

## 2015-11-15 ENCOUNTER — Ambulatory Visit (INDEPENDENT_AMBULATORY_CARE_PROVIDER_SITE_OTHER): Admitting: Cardiology

## 2015-11-15 ENCOUNTER — Encounter: Payer: Self-pay | Admitting: Cardiology

## 2015-11-15 VITALS — BP 144/88 | HR 105 | Ht 64.0 in | Wt 389.0 lb

## 2015-11-15 DIAGNOSIS — Z01818 Encounter for other preprocedural examination: Secondary | ICD-10-CM

## 2015-11-15 DIAGNOSIS — R0602 Shortness of breath: Secondary | ICD-10-CM

## 2015-11-15 DIAGNOSIS — R9431 Abnormal electrocardiogram [ECG] [EKG]: Secondary | ICD-10-CM | POA: Diagnosis not present

## 2015-11-15 DIAGNOSIS — E6609 Other obesity due to excess calories: Secondary | ICD-10-CM | POA: Diagnosis not present

## 2015-11-15 DIAGNOSIS — Z6841 Body Mass Index (BMI) 40.0 and over, adult: Secondary | ICD-10-CM

## 2015-11-15 DIAGNOSIS — IMO0001 Reserved for inherently not codable concepts without codable children: Secondary | ICD-10-CM

## 2015-11-15 NOTE — Patient Instructions (Addendum)
Testing/Procedures: Your physician has requested that you have an echocardiogram. Echocardiography is a painless test that uses sound waves to create images of your heart. It provides your doctor with information about the size and shape of your heart and how well your heart's chambers and valves are working. This procedure takes approximately one hour. There are no restrictions for this procedure.  [facility] MYOVIEW  Your caregiver has ordered a Stress Test with nuclear imaging. The purpose of this test is to evaluate the blood supply to your heart muscle. This procedure is referred to as a "Non-Invasive Stress Test." This is because other than having an IV started in your vein, nothing is inserted or "invades" your body. Cardiac stress tests are done to find areas of poor blood flow to the heart by determining the extent of coronary artery disease (CAD). Some patients exercise on a treadmill, which naturally increases the blood flow to your heart, while others who are  unable to walk on a treadmill due to physical limitations have a pharmacologic/chemical stress agent called Lexiscan . This medicine will mimic walking on a treadmill by temporarily increasing your coronary blood flow.   Please note: these test may take anywhere between 2-4 hours to complete  PLEASE REPORT TO [facility] MEDICAL MALL ENTRANCE  THE VOLUNTEERS AT THE FIRST DESK WILL DIRECT YOU WHERE TO GO  Date of Procedure:_Thursday November 9 & Friday November 10 at 08:00AM__  Arrival Time for Procedure:_Arrive at 07:45AM to register__   PLEASE NOTIFY THE OFFICE AT LEAST 24 HOURS IN ADVANCE IF YOU ARE UNABLE TO KEEP YOUR APPOINTMENT.  [phone] AND  PLEASE NOTIFY NUCLEAR MEDICINE AT [facility] AT LEAST 24 HOURS IN ADVANCE IF YOU ARE UNABLE TO KEEP YOUR APPOINTMENT. [phone]  How to prepare for your Myoview test:  1. Do not eat or drink after midnight 2. No caffeine for 24 hours prior to test 3. No smoking 24 hours prior to  test. 4. Your medication may be taken with water.  If your doctor stopped a medication because of this test, do not take that medication. 5. Ladies, please do not wear dresses.  Skirts or pants are appropriate. Please wear a short sleeve shirt. 6. No perfume, cologne or lotion. 7. Wear comfortable walking shoes. No heels!   Follow-Up: Your physician recommends that you schedule a follow-up appointment as needed with Dr. Alvino Chapel. We will call you with results and if needed schedule follow up at that time.  It was a pleasure seeing you today here in the office. Please do not hesitate to give [place] a call back if you have any further questions. [phone]  [place] Cellar RN, BSN     Echocardiogram An echocardiogram, or echocardiography, uses sound waves (ultrasound) to produce an image of your heart. The echocardiogram is simple, painless, obtained within a short period of time, and offers valuable information to your health care provider. The images from an echocardiogram can provide information such as:  Evidence of coronary artery disease (CAD).  Heart size.  Heart muscle function.  Heart valve function.  Aneurysm detection.  Evidence of a past heart attack.  Fluid buildup around the heart.  Heart muscle thickening.  Assess heart valve function. LET [facility] CARE PROVIDER KNOW ABOUT:  Any allergies you have.  All medicines you are taking, including vitamins, herbs, eye drops, creams, and over-the-counter medicines.  Previous problems you or members of your family have had with the use of anesthetics.  Any blood disorders you have.  Previous  surgeries you have had.  Medical conditions you have.  Possibility of pregnancy, if this applies. BEFORE THE PROCEDURE  No special preparation is needed. Eat and drink normally.  PROCEDURE   In order to produce an image of your heart, gel will be applied to your chest and a wand-like tool (transducer) will be moved over your  chest. The gel will help transmit the sound waves from the transducer. The sound waves will harmlessly bounce off your heart to allow the heart images to be captured in real-time motion. These images will then be recorded.  You may need an IV to receive a medicine that improves the quality of the pictures. AFTER THE PROCEDURE You may return to your normal schedule including diet, activities, and medicines, unless your health care provider tells you otherwise.   This information is not intended to replace advice given to you by your health care provider. Make sure you discuss any questions you have with your health care provider.   Document Released: 12/30/1999 Document Revised: 01/22/2014 Document Reviewed: 09/08/2012 Elsevier Interactive Patient Education 2016 Elsevier Inc.    Pharmacologic Stress Electrocardiogram A pharmacologic stress electrocardiogram is a heart (cardiac) test that uses nuclear imaging to evaluate the blood supply to your heart. This test may also be called a pharmacologic stress electrocardiography. Pharmacologic means that a medicine is used to increase your heart rate and blood pressure.  This stress test is done to find areas of poor blood flow to the heart by determining the extent of coronary artery disease (CAD). Some people exercise on a treadmill, which naturally increases the blood flow to the heart. For those people unable to exercise on a treadmill, a medicine is used. This medicine stimulates your heart and will cause your heart to beat harder and more quickly, as if you were exercising.  Pharmacologic stress tests can help determine:  The adequacy of blood flow to your heart during increased levels of activity in order to clear you for discharge home.  The extent of coronary artery blockage caused by CAD.  Your prognosis if you have suffered a heart attack.  The effectiveness of cardiac procedures done, such as an angioplasty, which can increase the  circulation in your coronary arteries.  Causes of chest pain or pressure. [facility] KNOW ABOUT:  Any allergies you have.  All medicines you are taking, including vitamins, herbs, eye drops, creams, and over-the-counter medicines.  Previous problems you or members of your family have had with the use of anesthetics.  Any blood disorders you have.  Previous surgeries you have had.  Medical conditions you have.  Possibility of pregnancy, if this applies.  If you are currently breastfeeding. RISKS AND COMPLICATIONS Generally, this is a safe procedure. However, as with any procedure, complications can occur. Possible complications include:  You develop pain or pressure in the following areas:  Chest.  Jaw or neck.  Between your shoulder blades.  Radiating down your left arm.  Headache.  Dizziness or light-headedness.  Shortness of breath.  Increased or irregular heartbeat.  Low blood pressure.  Nausea or vomiting.  Flushing.  Redness going up the arm and slight pain during injection of medicine.  Heart attack (rare). BEFORE THE PROCEDURE   Avoid all forms of caffeine for 24 hours before your test or as directed by your health care provider. This includes coffee, tea (even decaffeinated tea), caffeinated sodas, chocolate, cocoa, and certain pain medicines.  Follow your health care provider's instructions regarding eating  and drinking before the test.  Take your medicines as directed at regular times with water unless instructed otherwise. Exceptions may include:  If you have diabetes, ask how you are to take your insulin or pills. It is common to adjust insulin dosing the morning of the test.  If you are taking beta-blocker medicines, it is important to talk to your health care provider about these medicines well before the date of your test. Taking beta-blocker medicines may interfere with the test. In some cases, these medicines need to be  changed or stopped 24 hours or more before the test.  If you wear a nitroglycerin patch, it may need to be removed prior to the test. Ask your health care provider if the patch should be removed before the test.  If you use an inhaler for any breathing condition, bring it with you to the test.  If you are an outpatient, bring a snack so you can eat right after the stress phase of the test.  Do not smoke for 4 hours prior to the test or as directed by your health care provider.  Do not apply lotions, powders, creams, or oils on your chest prior to the test.  Wear comfortable shoes and clothing. Let your health care provider know if you were unable to complete or follow the preparations for your test. PROCEDURE   Multiple patches (electrodes) will be put on your chest. If needed, small areas of your chest may be shaved to get better contact with the electrodes. Once the electrodes are attached to your body, multiple wires will be attached to the electrodes, and your heart rate will be monitored.  An IV access will be started. A nuclear trace (isotope) is given. The isotope may be given intravenously, or it may be swallowed. Nuclear refers to several types of radioactive isotopes, and the nuclear isotope lights up the arteries so that the nuclear images are clear. The isotope is absorbed by your body. This results in low radiation exposure.  A resting nuclear image is taken to show how your heart functions at rest.  A medicine is given through the IV access.  A second scan is done about 1 hour after the medicine injection and determines how your heart functions under stress.  During this stress phase, you will be connected to an electrocardiogram machine. Your blood pressure and oxygen levels will be monitored. AFTER THE PROCEDURE   Your heart rate and blood pressure will be monitored after the test.  You may return to your normal schedule, including diet,activities, and medicines,  unless your health care provider tells you otherwise.   This information is not intended to replace advice given to you by your health care provider. Make sure you discuss any questions you have with your health care provider.   Document Released: 05/20/2008 Document Revised: 01/06/2013 Document Reviewed: 09/08/2012 Elsevier Interactive Patient Education [employer]2016 Elsevier Inc.

## 2015-11-15 NOTE — Progress Notes (Signed)
Cardiology Office Note   Date:  11/15/2015   ID:  Sierra EnsignChristina [name], DOB 04-Nov-1980, MRN [id number]  Referring Doctor:  Domenic SchwabLindley,Cheryl Paulette, FNP     Dr. Alva Garnetyner  Cardiologist:   Almond LintAileen Serge Main, MD   Reason for consultation:  Chief Complaint  Patient presents with  . other    New patient. Cardiac clearance. Feels SOB       History of Present Illness: Sierra [name] is a 35 y.o. female who presents for Preoperative evaluation prior to weight loss surgery  Patient does not have any chest pain. She does have shortness of breath with minimal exertion, going on for years, mild to moderate intensity, lasting the duration of the exertion, resolved at rest.  Patient does have some tingling and pain in her legs mostly at night and with resting, goes away with walking. She will be discussing this with her PCP. Patient denies PND, orthopnea, edema.  ROS:  Please see the history of present illness. Aside from mentioned under HPI, all other systems are reviewed and negative.     Past Medical History:  Diagnosis Date  . Abdominal pain, other specified site   . Anxiety state, unspecified   . Bipolar disorder, unspecified   . Depressive disorder, not elsewhere classified   . Edema   . Esophageal reflux   . History of kidney stones   . Mild or unspecified pre-eclampsia, unspecified as to episode of care   . Morbid obesity (HCC)   . Myalgia and myositis, unspecified   . Other dyspnea and respiratory abnormality   . Preeclampsia    with first pregnancy  . Urinary tract infection, site not specified     Past Surgical History:  Procedure Laterality Date  . CESAREAN SECTION    . CHOLECYSTECTOMY  03/2006  . LITHOTRIPSY    . TONSILLECTOMY AND ADENOIDECTOMY       reports that she has never smoked. She has never used smokeless tobacco. She reports that she drinks alcohol. She reports that she does not use drugs.   family history includes Alcohol abuse in her brother and father;  Coronary artery disease in her maternal grandfather and maternal grandmother; Depression in her brother, brother, father, mother, and sister; Diabetes in her maternal grandmother; Emphysema in her maternal grandfather; Heart attack in her maternal grandmother; Hyperlipidemia in her brother, brother, and mother; Lung cancer in her maternal grandfather; Sleep apnea in her mother.   Outpatient Medications Prior to Visit  Medication Sig Dispense Refill  . amLODipine (NORVASC) 5 MG tablet Take 5 mg by mouth daily.  3  . ARIPiprazole (ABILIFY) 20 MG tablet Take 1 tablet (20 mg total) by mouth daily. (Patient taking differently: Take 20 mg by mouth 3 (three) times a week. Monday, wed, friday) 90 tablet 1  . cholecalciferol (VITAMIN D) 1000 units tablet Take 5,000 Units by mouth daily.    Marland Kitchen. lamoTRIgine (LAMICTAL) 150 MG tablet Take 1 tablet (150 mg total) by mouth daily. 90 tablet 1  . Multiple Vitamin (MULTIVITAMIN) tablet Take 1 tablet by mouth daily.    Marland Kitchen. NORA-BE 0.35 MG tablet Take 1 tablet by mouth daily.  0  . PARAGARD INTRAUTERINE COPPER IU by Intrauterine route.    Marland Kitchen. SAVELLA 50 MG TABS tablet Take 100 mg by mouth 2 (two) times daily.  0  . topiramate (TOPAMAX) 200 MG tablet Take 1 tablet (200 mg total) by mouth daily. 90 tablet 1  . Vitamin D, Ergocalciferol, (DRISDOL) 50000 units CAPS capsule Take  50,000 Units by mouth every 7 (seven) days.    Marland Kitchen. acetaZOLAMIDE (DIAMOX) 250 MG tablet Take 250 mg by mouth 2 (two) times daily.    . mometasone (ELOCON) 0.1 % cream APPLY TOPICALLY ONCE DAILY AS DIRECTED  0   No facility-administered medications prior to visit.      Allergies: Review of patient's allergies indicates no known allergies.    PHYSICAL EXAM: VS:  BP (!) 144/88   Pulse (!) 105   Ht 5\' 4"  (1.626 m)   Wt (!) 389 lb (176.4 kg)   BMI 66.77 kg/m  , Body mass index is 66.77 kg/m. Wt Readings from Last 3 Encounters:  11/15/15 (!) 389 lb (176.4 kg)  10/18/15 (!) 390 lb 6.4 oz (177.1 kg)   10/06/15 (!) 389 lb (176.4 kg)    GENERAL:  well developed, well nourished, morbidly obese, not in acute distress HEENT: normocephalic, pink conjunctivae, anicteric sclerae, no xanthelasma, normal dentition, oropharynx clear NECK:  no neck vein engorgement, JVP normal, no hepatojugular reflux, carotid upstroke brisk and symmetric, no bruit, no thyromegaly, no lymphadenopathy LUNGS:  good respiratory effort, clear to auscultation bilaterally CV:  PMI not displaced, no thrills, no lifts, S1 and S2 within normal limits, no palpable S3 or S4, no murmurs, no rubs, no gallops ABD:  Soft, nontender, nondistended, normoactive bowel sounds, no abdominal aortic bruit, no hepatomegaly, no splenomegaly MS: nontender back, no kyphosis, no scoliosis, no joint deformities EXT:  2+ DP/PT pulses, no edema, no varicosities, no cyanosis, no clubbing SKIN: warm, nondiaphoretic, normal turgor, no ulcers NEUROPSYCH: alert, oriented to person, place, and time, sensory/motor grossly intact, normal mood, appropriate affect  Recent Labs: No results found for requested labs within last 8760 hours.   Lipid Panel    Component Value Date/Time   CHOL 147 09/11/2013 0812   TRIG 65.0 09/11/2013 0812   HDL 35.10 (L) 09/11/2013 0812   CHOLHDL 4 09/11/2013 0812   VLDL 13.0 09/11/2013 0812   LDLCALC 99 09/11/2013 0812     Other studies Reviewed:  EKG:  The ekg from 11/15/2015 was personally reviewed by me and it revealed sinus tachycardia, 105 BPM. Q waves in V1 and V2. Nonspecific ST-T wave changes. Possible LVH. Abnormal EKG  Additional studies/ records that were reviewed personally reviewed by me today include: None available   ASSESSMENT AND PLAN:  Shortness of breath Morbid obesity Abnormal EKG Preoperative cardiac evaluation Risk factors for CAD include obesity, family history of premature CAD Recommend echocardiogram and pharmacologic nuclear stress test If echocardiogram is unremarkable and no  ischemia on stress is, she will likely be low to intermediate cardiac risk for moderate risk surgery.  Current medicines are reviewed at length with the patient today.  The patient does not have concerns regarding medicines.  Labs/ tests ordered today include:  Orders Placed This Encounter  Procedures  . NM Myocar Multi W/Spect W/Wall Motion / EF  . EKG 12-Lead  . ECHOCARDIOGRAM COMPLETE    I had a lengthy and detailed discussion with the patient regarding diagnoses, prognosis, diagnostic options, treatment options , and side effects of medications.   I counseled the patient on importance of lifestyle modification including heart healthy diet, regular physical activity .   Disposition:   FU with undersigned after tests   Signed, Almond LintAileen Errol Ala, MD  11/15/2015 11:52 AM    [facility]  This note was generated in part with voice recognition software and I apologize for any typographical errors that were  not detected and corrected.

## 2015-11-18 ENCOUNTER — Other Ambulatory Visit: Payer: Self-pay

## 2015-11-18 ENCOUNTER — Ambulatory Visit (INDEPENDENT_AMBULATORY_CARE_PROVIDER_SITE_OTHER)

## 2015-11-18 DIAGNOSIS — R0602 Shortness of breath: Secondary | ICD-10-CM

## 2015-11-18 DIAGNOSIS — R9431 Abnormal electrocardiogram [ECG] [EKG]: Secondary | ICD-10-CM

## 2015-11-24 ENCOUNTER — Telehealth: Payer: Self-pay | Admitting: Cardiology

## 2015-11-24 ENCOUNTER — Encounter
Admission: RE | Admit: 2015-11-24 | Discharge: 2015-11-24 | Disposition: A | Discharge status: Home / Self Care | Source: Ambulatory Visit | Attending: Cardiology | Admitting: Cardiology

## 2015-11-24 DIAGNOSIS — R9431 Abnormal electrocardiogram [ECG] [EKG]: Secondary | ICD-10-CM | POA: Diagnosis not present

## 2015-11-24 DIAGNOSIS — R0602 Shortness of breath: Secondary | ICD-10-CM | POA: Diagnosis not present

## 2015-11-24 MED ORDER — TECHNETIUM TC 99M SESTAMIBI - CARDIOLITE: 30.0000 | Freq: Once | INTRAVENOUS | Status: DC | PRN

## 2015-11-24 MED ORDER — REGADENOSON 0.4 MG/5ML IV SOLN
0.4000 mg | Freq: Once | INTRAVENOUS | Status: AC
  Administered 2015-11-24: 0.4 mg via INTRAVENOUS

## 2015-11-24 NOTE — Telephone Encounter (Signed)
Spoke with patient and reviewed instructions and information for stress test tomorrow. She verbalized understanding and had no further questions.

## 2015-11-25 ENCOUNTER — Encounter
Admission: RE | Admit: 2015-11-25 | Discharge: 2015-11-25 | Disposition: A | Discharge status: Home / Self Care | Source: Ambulatory Visit | Attending: Cardiology | Admitting: Cardiology

## 2015-11-25 DIAGNOSIS — R0602 Shortness of breath: Secondary | ICD-10-CM | POA: Diagnosis not present

## 2015-11-25 MED ORDER — TECHNETIUM TC 99M TETROFOSMIN IV KIT
31.3430 | PACK | Freq: Once | INTRAVENOUS | Status: AC | PRN
  Administered 2015-11-25: 31.343 via INTRAVENOUS

## 2015-11-25 MED ORDER — TECHNETIUM TC 99M TETROFOSMIN IV KIT
28.7230 | PACK | Freq: Once | INTRAVENOUS | Status: AC | PRN
  Administered 2015-11-24: 28.723 via INTRAVENOUS

## 2015-11-27 LAB — NM MYOCAR MULTI W/SPECT W/WALL MOTION / EF
CSEPPHR: 129 {beats}/min
LV dias vol: 149 mL (ref 46–106)
LVSYSVOL: 62 mL
Percent HR: 68 %
Rest HR: 90 {beats}/min
SDS: 1
SRS: 23
SSS: 21
TID: 0.78

## 2015-12-06 ENCOUNTER — Encounter: Payer: Self-pay | Admitting: Psychiatry

## 2015-12-06 ENCOUNTER — Ambulatory Visit (INDEPENDENT_AMBULATORY_CARE_PROVIDER_SITE_OTHER): Admitting: Psychiatry

## 2015-12-06 VITALS — BP 136/84 | HR 92 | Ht 64.0 in | Wt 392.0 lb

## 2015-12-06 DIAGNOSIS — F3160 Bipolar disorder, current episode mixed, unspecified: Secondary | ICD-10-CM | POA: Diagnosis not present

## 2015-12-06 DIAGNOSIS — F411 Generalized anxiety disorder: Secondary | ICD-10-CM

## 2015-12-06 DIAGNOSIS — F5081 Binge eating disorder: Secondary | ICD-10-CM

## 2015-12-06 NOTE — Progress Notes (Signed)
Psychiatric Follow Up Notes.   Patient Identification: Sierra [name] MRN:  [id number] Date of Evaluation:  12/06/2015 Referral Source: [place] Chief Complaint:   Chief Complaint    Follow-up     Visit Diagnosis:  Bipolar Disorder, Mixed.  Binge Eating Disorder   Diagnosis:   Patient Active Problem List   Diagnosis Date Noted  . Tingling in extremities [R20.2] 08/29/2015  . Acute nonintractable headache [R51] 08/29/2015  . Visual disturbance [H53.9] 08/23/2015  . Polydipsia [R63.1] 03/23/2014  . Binge eating disorder [F50.81] 03/23/2014  . Vaginitis and vulvovaginitis [N76.0] 12/31/2013  . Paronychia [IMO0002] 09/21/2013  . Essential hypertension, benign [I10] 06/05/2013  . Bilateral leg pain [M79.604, M79.605] 05/22/2013  . Right carpal tunnel syndrome [G56.01] 09/16/2012  . General counseling and advice on female contraception [Z30.09] 01/22/2012  . Anhydramnios [O41.00X0] 01/02/2012  . Umbilical cord complication [G38.7FI4] 33/29/5188  . High-risk pregnancy [O09.90] 11/09/2011  . Affective bipolar disorder ([place]) [F31.9] 10/12/2011  . H/O cesarean section [Z98.891] 10/12/2011  . Calculus of kidney [N20.0] 10/12/2011  . Obesity affecting pregnancy, antepartum [O99.210] 10/12/2011  . Bipolar affective disorder ([place]) [F31.9] 10/12/2011  . History of cesarean section [Z98.891] 10/12/2011  . SNORING [R06.09, R09.89] 07/23/2007  . EDEMA [R60.9] 06/11/2007  . MORBID OBESITY [E66.01] 12/23/2006  . BIPOLAR AFFECTIVE DISORDER [F31.9] 12/23/2006  . ANXIETY [F41.1] 12/23/2006  . DEPRESSION [F32.9] 12/23/2006  . GERD [K21.9] 12/23/2006  . MILD/UNSPEC PRE-ECLAMPSIA UNSPEC AS EPISODE CARE [IMO0002] 12/23/2006  . Fibromyalgia [M79.7] 12/23/2006   History of Present Illness:    Pt is a 35 year old morbidly obese female who presented for  follow-up appointment. She reported that she is Eating to have her bariatric surgery. She is undergoing testing. Patient  reported that she has met with her surgeon and they have asked her to reduce 30 pounds before her surgery. They have asked her to go strict diet and patient is concerned that she does not want to stop eating before the holidays. Patient reported that her son is also coming for [place] in December. She is planning to his start her diet in January. She is excited about her surgery. She reported that she is medically healthy and does not have any issues. She has already met with Dr. Duke Salvia and is very excited.  She is looking forward to have her surgery.   She reported that she is compliant with her medications and has refills available. She currently denied having any side effects of the medications. Patient reported that she is excited that she will lose weight after the surgery.   She appeared happy and calm during the interview. She currently denied having any suicidal ideations or plans she denied having any perceptual disturbances. She is sleeping well at night. We discussed about the surgery in detail and she is agreeable.   Past Medical History:  Past Medical History:  Diagnosis Date  . Abdominal pain, other specified site   . Anxiety state, unspecified   . Bipolar disorder, unspecified   . Depressive disorder, not elsewhere classified   . Edema   . Esophageal reflux   . History of kidney stones   . Mild or unspecified pre-eclampsia, unspecified as to episode of care   . Morbid obesity ([place])   . Myalgia and myositis, unspecified   . Other dyspnea and respiratory abnormality   . Preeclampsia    with first pregnancy  . Urinary tract infection, site not specified     Past Surgical History:  Procedure Laterality Date  .  CESAREAN SECTION    . CHOLECYSTECTOMY  03/2006  . LITHOTRIPSY    . TONSILLECTOMY AND ADENOIDECTOMY     Social History:   Social History   Social History  . Marital status: Married    Spouse name: N/A  . Number of children: 1  . Years of education: N/A    Occupational History  . Home maker    Social History Main Topics  . Smoking status: Never Smoker  . Smokeless tobacco: Never Used  . Alcohol use 0.0 - 0.6 oz/week     Comment: occasional < 1 month  . Drug use: No  . Sexual activity: Yes    Partners: Male    Birth control/ protection: None, IUD   Other Topics Concern  . None   Social History Narrative   1 child, 2 step-sons      Regular exercise-no   Diet: no fast food, likes sweets   Additional Social History:  Married X 11 years. Has  Two sons ages  7 and 66.  Has 2 step sons ages 56 and 53. 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  Husband is a retired Psychologist, clinical and has PTSD.    Musculoskeletal: Strength & Muscle Tone: within normal limits Gait & Station: normal Patient leans: N/A  Psychiatric Specialty Exam: HPI  Review of Systems  Constitutional: Positive for malaise/fatigue and weight loss.  Eyes: Negative.   Respiratory: Negative.   Cardiovascular: Negative for palpitations and leg swelling.  Gastrointestinal: Negative.   Genitourinary: Negative.   Musculoskeletal: Negative for back pain, joint pain and neck pain.  Skin: Negative for rash.  Neurological: Negative.   Psychiatric/Behavioral: Negative for depression and suicidal ideas. The patient is nervous/anxious. The patient does not have insomnia.     Blood pressure 136/84, pulse 92, height '5\' 4"'  (1.626 m), weight (!) 392 lb (177.8 kg), last menstrual period 11/29/2015.Body mass index is 67.29 kg/m.  General Appearance: Casual  Eye Contact:  Fair  Speech:  Clear and Coherent  Volume:  Normal  Mood:  Euthymic  Affect:  Congruent  Thought Process:  Coherent  Orientation:  Full (Time, Place, and Person)  Thought Content:  WDL  Suicidal Thoughts:  No  Homicidal Thoughts:  No  Memory:  Immediate;   Fair  Judgement:  Fair  Insight:  Fair  Psychomotor Activity:  Normal  Concentration:  Fair  Recall:  [employer] of  Knowledge:Fair  Language: Fair  Akathisia:  No  Handed:  Right  AIMS (if indicated):    Assets:  Communication Skills Desire for Improvement Social Support  ADL's:  Intact  Cognition: WNL  Sleep:  Difficulty going to sleep   Is the patient at risk to self?  No. Has the patient been a risk to self in the past 6 months?  No. Has the patient been a risk to self within the distant past?  No. Is the patient a risk to others?  No. Has the patient been a risk to others in the past 6 months?  No. Has the patient been a risk to others within the distant past?  No.  Allergies:  No Known Allergies Current Medications: Current Outpatient Prescriptions  Medication Sig Dispense Refill  . amLODipine (NORVASC) 5 MG tablet Take 5 mg by mouth daily.  3  . ARIPiprazole (ABILIFY) 20 MG tablet Take 1 tablet (20 mg total) by mouth daily. (Patient taking differently: Take 20 mg by mouth 3 (three) times  a week. Monday, wed, friday) 90 tablet 1  . cholecalciferol (VITAMIN D) 1000 units tablet Take 5,000 Units by mouth daily.    . IRON PO Take 130 mg by mouth daily.    Marland Kitchen lamoTRIgine (LAMICTAL) 150 MG tablet Take 1 tablet (150 mg total) by mouth daily. 90 tablet 1  . Multiple Vitamin (MULTIVITAMIN) tablet Take 1 tablet by mouth daily.    Marland Kitchen NORA-BE 0.35 MG tablet Take 1 tablet by mouth daily.  0  . PARAGARD INTRAUTERINE COPPER IU by Intrauterine route.    Marland Kitchen SAVELLA 50 MG TABS tablet Take 100 mg by mouth 2 (two) times daily.  0  . topiramate (TOPAMAX) 200 MG tablet Take 1 tablet (200 mg total) by mouth daily. 90 tablet 1  . Vitamin D, Ergocalciferol, (DRISDOL) 50000 units CAPS capsule Take 50,000 Units by mouth every 7 (seven) days.     No current facility-administered medications for this visit.    Facility-Administered Medications Ordered in Other Visits  Medication Dose Route Frequency Provider Last Rate Last Dose  . technetium sestamibi (CARDIOLITE) injection 30 millicurie  30 millicurie Intravenous  Once PRN [employer] MD          Substance Abuse History in the last 12 months:  No.  Consequences of Substance Abuse: NA  Medical Decision Making:  Review of Psycho-Social Stressors (1) and Established Problem, Worsening (2)  Treatment Plan Summary: Medication management   Depression Patient will continue on Abilify 20 mg on alternate days and she will take it on Monday Wednesday and Friday.  Mood stabilization Continue  lamotrigine 150 mg daily  Binge eating disorder Continue  Topamax to 200  mg daily    Patient has supply of her medications. No medications dispensed at this time.   Discussed with her about the side effects of medications and she demonstrated understanding  Follow-up in 2 months   More than 50% of the time spent in psychoeducation, counseling and coordination of care.     This note was generated in part or whole with voice recognition software. Voice regonition is usually quite accurate but there are transcription errors that can and very often do occur. I apologize for any typographical errors that were not detected and corrected.    Rainey Pines, MD  11/21/201710:05 AM

## 2016-01-25 ENCOUNTER — Ambulatory Visit (INDEPENDENT_AMBULATORY_CARE_PROVIDER_SITE_OTHER): Admitting: Psychiatry

## 2016-01-25 ENCOUNTER — Encounter: Payer: Self-pay | Admitting: Psychiatry

## 2016-01-25 VITALS — BP 139/83 | HR 116 | Wt 391.0 lb

## 2016-01-25 DIAGNOSIS — F5081 Binge eating disorder: Secondary | ICD-10-CM

## 2016-01-25 DIAGNOSIS — F411 Generalized anxiety disorder: Secondary | ICD-10-CM | POA: Diagnosis not present

## 2016-01-25 DIAGNOSIS — F3160 Bipolar disorder, current episode mixed, unspecified: Secondary | ICD-10-CM

## 2016-01-25 MED ORDER — VENLAFAXINE HCL ER 75 MG PO CP24: 75.0000 mg | ORAL_CAPSULE | Freq: Two times a day (BID) | ORAL | 1 refills | Status: DC

## 2016-01-25 NOTE — Progress Notes (Signed)
Psychiatric Follow Up Notes.   Patient Identification: Sierra [name] MRN:  [id number] Date of Evaluation:  01/25/2016 [facility] Chief Complaint:   Chief Complaint    Depression; Follow-up; Medication Refill     Visit Diagnosis:  Bipolar Disorder, Mixed.  Binge Eating Disorder   Diagnosis:   Patient Active Problem List   Diagnosis Date Noted  . Tingling in extremities [R20.2] 08/29/2015  . Acute nonintractable headache [R51] 08/29/2015  . Visual disturbance [H53.9] 08/23/2015  . Polydipsia [R63.1] 03/23/2014  . Binge eating disorder [F50.81] 03/23/2014  . Vaginitis and vulvovaginitis [N76.0] 12/31/2013  . Paronychia [IMO0002] 09/21/2013  . Essential hypertension, benign [I10] 06/05/2013  . Bilateral leg pain [M79.604, M79.605] 05/22/2013  . Right carpal tunnel syndrome [G56.01] 09/16/2012  . General counseling and advice on female contraception [Z30.09] 01/22/2012  . Anhydramnios [O41.00X0] 01/02/2012  . Umbilical cord complication [O69.9XX0] 12/07/2011  . High-risk pregnancy [O09.90] 11/09/2011  . Affective bipolar disorder (HCC) [F31.9] 10/12/2011  . H/O cesarean section [Z98.891] 10/12/2011  . Calculus of kidney [N20.0] 10/12/2011  . Obesity affecting pregnancy, antepartum [O99.210] 10/12/2011  . Bipolar affective disorder (HCC) [F31.9] 10/12/2011  . History of cesarean section [Z98.891] 10/12/2011  . SNORING [R06.09, R09.89] 07/23/2007  . EDEMA [R60.9] 06/11/2007  . MORBID OBESITY [E66.01] 12/23/2006  . BIPOLAR AFFECTIVE DISORDER [F31.9] 12/23/2006  . ANXIETY [F41.1] 12/23/2006  . DEPRESSION [F32.9] 12/23/2006  . GERD [K21.9] 12/23/2006  . MILD/UNSPEC PRE-ECLAMPSIA UNSPEC AS EPISODE CARE [IMO0002] 12/23/2006  . Fibromyalgia [M79.7] 12/23/2006   History of Present Illness:    Pt is a 36 year old morbidly obese female who presented for  follow-up appointment. She reported that she was sick over the [place] and her family  member were having the upper respiratory infection and the diarrhea as well. She reported that she is recuperating at this time. Patient reported that she has been feeling depressed for the past 2 weeks. Patient reported that she wants her medications to be adjusted. Patient reported that she has been compliant with her medications but she feels that the medications are not helping her. She currently denied having any suicidal ideations or plans. She reported that she has discussed with Dr. Alva Garnet office and they wants her to start the liver diet. Patient reported that she sleeps well at night. We discussed about her medications and she reported that she has tried the Effexor and Lexapro in the past. She is willing to restart the Effexor XR at this time. She is supposed to take the liquid or the chewable pills after her surgery. We discussed about her medications. There is no liquid or chewable available for Abilify and discussed with the patient about the same.   She reported that she is compliant with her medications and has refills available. She currently denied having any side effects of the medications. Patient reported that she is excited that she will lose weight after the surgery.     Past Medical History:  Past Medical History:  Diagnosis Date  . Abdominal pain, other specified site   . Anxiety state, unspecified   . Bipolar disorder, unspecified   . Depressive disorder, not elsewhere classified   . Edema   . Esophageal reflux   . History of kidney stones   . Mild or unspecified pre-eclampsia, unspecified as to episode of care   . Morbid obesity (HCC)   . Myalgia and myositis, unspecified   . Other dyspnea and respiratory abnormality   . Preeclampsia    with  first pregnancy  . Urinary tract infection, site not specified     Past Surgical History:  Procedure Laterality Date  . CESAREAN SECTION    . CHOLECYSTECTOMY  03/2006  . LITHOTRIPSY    . TONSILLECTOMY AND ADENOIDECTOMY      Social History:   Social History   Social History  . Marital status: Married    Spouse name: N/A  . Number of children: 1  . Years of education: N/A   Occupational History  . Home maker    Social History Main Topics  . Smoking status: Never Smoker  . Smokeless tobacco: Never Used  . Alcohol use 0.0 - 0.6 oz/week     Comment: occasional < 1 month  . Drug use: No  . Sexual activity: Yes    Partners: Male    Birth control/ protection: None, IUD   Other Topics Concern  . None   Social History Narrative   1 child, 2 step-sons      Regular exercise-no   Diet: no fast food, likes sweets   Additional Social History:  Married X 11 years. Has  Two sons ages  618 and 333.  Has 2 step sons ages 2423 and 2720. 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  Husband is a retired Administrator, [employer] and has PTSD.    Musculoskeletal: Strength & Muscle Tone: within normal limits Gait & Station: normal Patient leans: N/A  Psychiatric Specialty Exam: Depression         Associated symptoms include does not have insomnia and no suicidal ideas. Medication Refill  Pertinent negatives include no neck pain or rash.    Review of Systems  Constitutional: Positive for malaise/fatigue and weight loss.  Eyes: Negative.   Respiratory: Negative.   Cardiovascular: Negative for palpitations and leg swelling.  Gastrointestinal: Negative.   Genitourinary: Negative.   Musculoskeletal: Negative for back pain, joint pain and neck pain.  Skin: Negative for rash.  Neurological: Negative.   Psychiatric/Behavioral: Positive for depression. Negative for suicidal ideas. The patient is nervous/anxious. The patient does not have insomnia.     Blood pressure 139/83, pulse (!) 116, weight (!) 391 lb (177.4 kg).Body mass index is 67.11 kg/m.  General Appearance: Casual  Eye Contact:  Fair  Speech:  Clear and Coherent  Volume:  Normal  Mood:  Euthymic  Affect:  Congruent  Thought Process:  Coherent   Orientation:  Full (Time, Place, and Person)  Thought Content:  WDL  Suicidal Thoughts:  No  Homicidal Thoughts:  No  Memory:  Immediate;   Fair  Judgement:  Fair  Insight:  Fair  Psychomotor Activity:  Normal  Concentration:  Fair  Recall:  [employer]Fair  Fund of Knowledge:Fair  Language: Fair  Akathisia:  No  Handed:  Right  AIMS (if indicated):    Assets:  Communication Skills Desire for Improvement Social Support  ADL's:  Intact  Cognition: WNL  Sleep:  Difficulty going to sleep   Is the patient at risk to self?  No. Has the patient been a risk to self in the past 6 months?  No. Has the patient been a risk to self within the distant past?  No. Is the patient a risk to others?  No. Has the patient been a risk to others in the past 6 months?  No. Has the patient been a risk to others within the distant past?  No.  Allergies:  No Known Allergies Current Medications: Current Outpatient  Prescriptions  Medication Sig Dispense Refill  . amLODipine (NORVASC) 5 MG tablet Take 5 mg by mouth daily.  3  . ARIPiprazole (ABILIFY) 20 MG tablet Take 1 tablet (20 mg total) by mouth daily. (Patient taking differently: Take 20 mg by mouth 3 (three) times a week. Monday, wed, friday) 90 tablet 1  . cholecalciferol (VITAMIN D) 1000 units tablet Take 5,000 Units by mouth daily.    . IRON PO Take 130 mg by mouth daily.    Marland Kitchen lamoTRIgine (LAMICTAL) 150 MG tablet Take 1 tablet (150 mg total) by mouth daily. 90 tablet 1  . Multiple Vitamin (MULTIVITAMIN) tablet Take 1 tablet by mouth daily.    Marland Kitchen NORA-BE 0.35 MG tablet Take 1 tablet by mouth daily.  0  . PARAGARD INTRAUTERINE COPPER IU by Intrauterine route.    Marland Kitchen SAVELLA 50 MG TABS tablet Take 100 mg by mouth 2 (two) times daily.  0  . topiramate (TOPAMAX) 200 MG tablet Take 1 tablet (200 mg total) by mouth daily. 90 tablet 1  . Vitamin D, Ergocalciferol, (DRISDOL) 50000 units CAPS capsule Take 50,000 Units by mouth every 7 (seven) days.     No current  facility-administered medications for this visit.    Facility-Administered Medications Ordered in Other Visits  Medication Dose Route Frequency Provider Last Rate Last Dose  . technetium sestamibi (CARDIOLITE) injection 30 millicurie  30 millicurie Intravenous Once PRN [employer] MD          Substance Abuse History in the last 12 months:  No.  Consequences of Substance Abuse: NA  Medical Decision Making:  Review of Psycho-Social Stressors (1) and Established Problem, Worsening (2)  Treatment Plan Summary: Medication management   Depression Patient will continue on Abilify 20 mg on alternate days and she will take it on Monday Wednesday and Friday.  Mood stabilization Continue  lamotrigine 150 mg daily  Binge eating disorder Continue  Topamax to 200  mg daily   I will start her on Effexor XR 75 mg daily and she will titrate the dose to 150 mg after 1 week. Discussed with the patient about the interaction with Savella and advised her to keep an eye about serotonin syndrome and she demonstrated understanding. She will follow-up in 2 weeks or earlier depending on her symptoms.   Discussed with her about the side effects of medications and she demonstrated understanding  Follow-up in  2weeks.    More than 50% of the time spent in psychoeducation, counseling and coordination of care.     This note was generated in part or whole with voice recognition software. Voice regonition is usually quite accurate but there are transcription errors that can and very often do occur. I apologize for any typographical errors that were not detected and corrected.    Brandy Hale, MD  1/10/201810:33 AM

## 2016-02-08 ENCOUNTER — Encounter: Payer: Self-pay | Admitting: Psychiatry

## 2016-02-08 ENCOUNTER — Ambulatory Visit (INDEPENDENT_AMBULATORY_CARE_PROVIDER_SITE_OTHER): Admitting: Psychiatry

## 2016-02-08 VITALS — BP 141/85 | HR 106 | Temp 97.7°F | Wt 394.2 lb

## 2016-02-08 DIAGNOSIS — F3160 Bipolar disorder, current episode mixed, unspecified: Secondary | ICD-10-CM | POA: Diagnosis not present

## 2016-02-08 DIAGNOSIS — F5081 Binge eating disorder: Secondary | ICD-10-CM | POA: Diagnosis not present

## 2016-02-08 DIAGNOSIS — F411 Generalized anxiety disorder: Secondary | ICD-10-CM

## 2016-02-08 DIAGNOSIS — F50819 Binge eating disorder, unspecified: Secondary | ICD-10-CM

## 2016-02-08 MED ORDER — LAMOTRIGINE 150 MG PO TABS: 150.0000 mg | ORAL_TABLET | Freq: Every day | ORAL | 1 refills | Status: DC

## 2016-02-08 MED ORDER — TOPIRAMATE 200 MG PO TABS: 200.0000 mg | ORAL_TABLET | Freq: Every day | ORAL | 1 refills | Status: DC

## 2016-02-08 MED ORDER — ARIPIPRAZOLE 20 MG PO TABS: 20.0000 mg | ORAL_TABLET | ORAL | 1 refills | Status: DC

## 2016-02-08 MED ORDER — VENLAFAXINE HCL ER 150 MG PO CP24: 150.0000 mg | ORAL_CAPSULE | Freq: Every day | ORAL | 1 refills | Status: DC

## 2016-02-08 NOTE — Progress Notes (Signed)
Psychiatric Follow Up Notes.   Patient Identification: Sierra EnsignChristina [name] MRN:  [id number] Date of Evaluation:  02/08/2016 [facility] Chief Complaint:   Chief Complaint    Follow-up; Medication Refill     Visit Diagnosis:  Bipolar Disorder, Mixed.  Binge Eating Disorder   Diagnosis:   Patient Active Problem List   Diagnosis Date Noted  . Tingling in extremities [R20.2] 08/29/2015  . Acute nonintractable headache [R51] 08/29/2015  . Visual disturbance [H53.9] 08/23/2015  . Polydipsia [R63.1] 03/23/2014  . Binge eating disorder [F50.81] 03/23/2014  . Vaginitis and vulvovaginitis [N76.0] 12/31/2013  . Paronychia [IMO0002] 09/21/2013  . Essential hypertension, benign [I10] 06/05/2013  . Bilateral leg pain [M79.604, M79.605] 05/22/2013  . Right carpal tunnel syndrome [G56.01] 09/16/2012  . General counseling and advice on female contraception [Z30.09] 01/22/2012  . Anhydramnios [O41.00X0] 01/02/2012  . Umbilical cord complication [O69.9XX0] 12/07/2011  . High-risk pregnancy [O09.90] 11/09/2011  . Affective bipolar disorder (HCC) [F31.9] 10/12/2011  . H/O cesarean section [Z98.891] 10/12/2011  . Calculus of kidney [N20.0] 10/12/2011  . Obesity affecting pregnancy, antepartum [O99.210] 10/12/2011  . Bipolar affective disorder (HCC) [F31.9] 10/12/2011  . History of cesarean section [Z98.891] 10/12/2011  . SNORING [R06.09, R09.89] 07/23/2007  . EDEMA [R60.9] 06/11/2007  . MORBID OBESITY [E66.01] 12/23/2006  . BIPOLAR AFFECTIVE DISORDER [F31.9] 12/23/2006  . ANXIETY [F41.1] 12/23/2006  . DEPRESSION [F32.9] 12/23/2006  . GERD [K21.9] 12/23/2006  . MILD/UNSPEC PRE-ECLAMPSIA UNSPEC AS EPISODE CARE [IMO0002] 12/23/2006  . Fibromyalgia [M79.7] 12/23/2006   History of Present Illness:    Pt is a 36 year old morbidly obese female who presented for  follow-up appointment. She reported that she Is doing better and has responded well to the Effexor.  Patient reported that she has been compliant with her medications. Patient reported that the Effexor is helping her and her anxiety is improving. She currently denied having any side effects of the medication. Patient reported that she is waiting for the psychological testing done so she can start her liver reduction diet. She is looking forward for her surgery. She appeared calm and alert during the interview. She supported that she is helping her family members and is cleaning the house and is in the attic. She stated that she has more energy and is noticing that her anxiety is improving. She has been following with her therapist on a regular basis. She currently denied having anxiety at this time. She denied having any suicidal homicidal ideations or plans. She denied having any perceptual disturbances.    She reported that she is compliant with her medications and has refills available. She currently denied having any side effects of the medications. Patient reported that she is excited that she will lose weight after the surgery.     Past Medical History:  Past Medical History:  Diagnosis Date  . Abdominal pain, other specified site   . Anxiety state, unspecified   . Bipolar disorder, unspecified   . Depressive disorder, not elsewhere classified   . Edema   . Esophageal reflux   . History of kidney stones   . Mild or unspecified pre-eclampsia, unspecified as to episode of care   . Morbid obesity (HCC)   . Myalgia and myositis, unspecified   . Other dyspnea and respiratory abnormality   . Preeclampsia    with first pregnancy  . Urinary tract infection, site not specified     Past Surgical History:  Procedure Laterality Date  . CESAREAN SECTION    . CHOLECYSTECTOMY  03/2006  . LITHOTRIPSY    . TONSILLECTOMY AND ADENOIDECTOMY     Social History:   Social History   Social History  . Marital status: Married    Spouse name: N/A  . Number of children: 1  . Years of education: N/A    Occupational History  . Home maker    Social History Main Topics  . Smoking status: Never Smoker  . Smokeless tobacco: Never Used  . Alcohol use 0.0 - 0.6 oz/week     Comment: occasional < 1 month  . Drug use: No  . Sexual activity: Yes    Partners: Male    Birth control/ protection: None, IUD   Other Topics Concern  . None   Social History Narrative   1 child, 2 step-sons      Regular exercise-no   Diet: no fast food, likes sweets   Additional Social History:  Married X 11 years. Has  Two sons ages  35 and 54.  Has 2 step sons ages 64 and 39. 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  Husband is a retired Administrator, [employer] Service and has PTSD.    Musculoskeletal: Strength & Muscle Tone: within normal limits Gait & Station: normal Patient leans: N/A  Psychiatric Specialty Exam: Depression         Associated symptoms include does not have insomnia and no suicidal ideas. Medication Refill  Pertinent negatives include no neck pain or rash.    Review of Systems  Constitutional: Positive for malaise/fatigue and weight loss.  Eyes: Negative.   Respiratory: Negative.   Cardiovascular: Negative for palpitations and leg swelling.  Gastrointestinal: Negative.   Genitourinary: Negative.   Musculoskeletal: Negative for back pain, joint pain and neck pain.  Skin: Negative for rash.  Neurological: Negative.   Psychiatric/Behavioral: Positive for depression. Negative for suicidal ideas. The patient is nervous/anxious. The patient does not have insomnia.     Blood pressure (!) 141/85, pulse (!) 106, temperature 97.7 F (36.5 C), temperature source Oral, weight (!) 394 lb 3.2 oz (178.8 kg), last menstrual period 01/22/2016.Body mass index is 67.66 kg/m.  General Appearance: Casual  Eye Contact:  Fair  Speech:  Clear and Coherent  Volume:  Normal  Mood:  Euthymic  Affect:  Congruent  Thought Process:  Coherent  Orientation:  Full (Time, Place, and Person)  Thought  Content:  WDL  Suicidal Thoughts:  No  Homicidal Thoughts:  No  Memory:  Immediate;   Fair  Judgement:  Fair  Insight:  Fair  Psychomotor Activity:  Normal  Concentration:  Fair  Recall:  [employer] of Knowledge:Fair  Language: Fair  Akathisia:  No  Handed:  Right  AIMS (if indicated):    Assets:  Communication Skills Desire for Improvement Social Support  ADL's:  Intact  Cognition: WNL  Sleep:  Difficulty going to sleep   Is the patient at risk to self?  No. Has the patient been a risk to self in the past 6 months?  No. Has the patient been a risk to self within the distant past?  No. Is the patient a risk to others?  No. Has the patient been a risk to others in the past 6 months?  No. Has the patient been a risk to others within the distant past?  No.  Allergies:  No Known Allergies Current Medications: Current Outpatient Prescriptions  Medication Sig Dispense Refill  . amLODipine (NORVASC) 5 MG tablet Take 5 mg  by mouth daily.  3  . ARIPiprazole (ABILIFY) 20 MG tablet Take 1 tablet (20 mg total) by mouth daily. (Patient taking differently: Take 20 mg by mouth 3 (three) times a week. Monday, wed, friday) 90 tablet 1  . cholecalciferol (VITAMIN D) 1000 units tablet Take 5,000 Units by mouth daily.    . IRON PO Take 130 mg by mouth daily.    Marland Kitchen lamoTRIgine (LAMICTAL) 150 MG tablet Take 1 tablet (150 mg total) by mouth daily. 90 tablet 1  . Multiple Vitamin (MULTIVITAMIN) tablet Take 1 tablet by mouth daily.    Marland Kitchen NORA-BE 0.35 MG tablet Take 1 tablet by mouth daily.  0  . PARAGARD INTRAUTERINE COPPER IU by Intrauterine route.    Marland Kitchen SAVELLA 50 MG TABS tablet Take 100 mg by mouth 2 (two) times daily.  0  . topiramate (TOPAMAX) 200 MG tablet Take 1 tablet (200 mg total) by mouth daily. 90 tablet 1  . venlafaxine XR (EFFEXOR XR) 75 MG 24 hr capsule Take 1 capsule (75 mg total) by mouth 2 (two) times daily. 60 capsule 1  . Vitamin D, Ergocalciferol, (DRISDOL) 50000 units CAPS  capsule Take 50,000 Units by mouth every 7 (seven) days.     No current facility-administered medications for this visit.    Facility-Administered Medications Ordered in Other Visits  Medication Dose Route Frequency Provider Last Rate Last Dose  . technetium sestamibi (CARDIOLITE) injection 30 millicurie  30 millicurie Intravenous Once PRN [employer] MD          Substance Abuse History in the last 12 months:  No.  Consequences of Substance Abuse: NA  Medical Decision Making:  Review of Psycho-Social Stressors (1) and Established Problem, Worsening (2)  Treatment Plan Summary: Medication management   Depression Patient will continue on Abilify 20 mg on alternate days and she will take it on Monday Wednesday and Friday.  Mood stabilization Continue  lamotrigine 150 mg daily  Binge eating disorder Continue  Topamax to 200  mg daily   I will start her on Effexor XR 150  mg daily  Discussed with the patient about the interaction with Savella and advised her to keep an eye about serotonin syndrome and she demonstrated understanding. She will follow-up in 6  weeks or earlier depending on her symptoms.   Discussed with her about the side effects of medications and she demonstrated understanding     More than 50% of the time spent in psychoeducation, counseling and coordination of care.     This note was generated in part or whole with voice recognition software. Voice regonition is usually quite accurate but there are transcription errors that can and very often do occur. I apologize for any typographical errors that were not detected and corrected.    Brandy Hale, MD  1/24/201811:29 AM

## 2016-03-05 ENCOUNTER — Telehealth: Payer: Self-pay

## 2016-03-05 NOTE — Telephone Encounter (Signed)
left message back that Dr. Garnetta BuddyFaheem was out of the office all this week .  pt was advised to go to ER and to please call our office back tomorrow.

## 2016-03-05 NOTE — Telephone Encounter (Signed)
pt called states she is having issues with the effexor.

## 2016-03-06 NOTE — Telephone Encounter (Signed)
Pt was called and told to stop the effexor and that she should go to er if she still having the suicidal thoughts.  Pt states that she will go to er if it gets worse.

## 2016-03-06 NOTE — Telephone Encounter (Signed)
Pt was called and told to stop the effexor and that she should go to er if she still having the suicidal thoughts.  Pt states that she will go to er if it gets worse.

## 2016-03-06 NOTE — Telephone Encounter (Signed)
Please let patient know that she will have to go to the ER if she is having suicidal thoughts

## 2016-03-06 NOTE — Telephone Encounter (Signed)
pt called again. pt was told that Dr. Garnetta BuddyFaheem was not in the office this week and that I would consult with Dr. Daleen Boavi when she come into the office.  Patient states she is having mood swings and suicidal thoughts. Patient has been on the effoxor for about 5 weeks.  She states she needs to try something else. .Marland Kitchen

## 2016-03-15 HISTORY — PX: LAPAROSCOPIC GASTRIC RESTRICTIVE DUODENAL PROCEDURE (DUODENAL SWITCH): SHX6667

## 2016-03-21 ENCOUNTER — Ambulatory Visit (INDEPENDENT_AMBULATORY_CARE_PROVIDER_SITE_OTHER): Admitting: Psychiatry

## 2016-03-21 ENCOUNTER — Encounter: Payer: Self-pay | Admitting: Psychiatry

## 2016-03-21 VITALS — BP 133/83 | HR 102 | Temp 97.4°F | Wt 377.2 lb

## 2016-03-21 DIAGNOSIS — F411 Generalized anxiety disorder: Secondary | ICD-10-CM | POA: Diagnosis not present

## 2016-03-21 DIAGNOSIS — F5081 Binge eating disorder: Secondary | ICD-10-CM | POA: Diagnosis not present

## 2016-03-21 DIAGNOSIS — F3160 Bipolar disorder, current episode mixed, unspecified: Secondary | ICD-10-CM

## 2016-03-21 MED ORDER — LAMOTRIGINE 25 MG PO CHEW: 100.0000 mg | CHEWABLE_TABLET | Freq: Every day | ORAL | 1 refills | Status: DC

## 2016-03-21 NOTE — Progress Notes (Signed)
Psychiatric Follow Up Notes.   Patient Identification: Sierra [name] MRN:  [id number] Date of Evaluation:  03/21/2016 [facility] Chief Complaint:   Chief Complaint    Follow-up; Medication Refill     Visit Diagnosis:  Bipolar Disorder, Mixed.  Binge Eating Disorder   Diagnosis:   Patient Active Problem List   Diagnosis Date Noted  . Tingling in extremities [R20.2] 08/29/2015  . Acute nonintractable headache [R51] 08/29/2015  . Visual disturbance [H53.9] 08/23/2015  . Polydipsia [R63.1] 03/23/2014  . Binge eating disorder [F50.81] 03/23/2014  . Vaginitis and vulvovaginitis [N76.0] 12/31/2013  . Paronychia [IMO0002] 09/21/2013  . Essential hypertension, benign [I10] 06/05/2013  . Bilateral leg pain [M79.604, M79.605] 05/22/2013  . Right carpal tunnel syndrome [G56.01] 09/16/2012  . General counseling and advice on female contraception [Z30.09] 01/22/2012  . Anhydramnios [O41.00X0] 01/02/2012  . Umbilical cord complication [O69.9XX0] 12/07/2011  . High-risk pregnancy [O09.90] 11/09/2011  . Affective bipolar disorder (HCC) [F31.9] 10/12/2011  . H/O cesarean section [Z98.891] 10/12/2011  . Calculus of kidney [N20.0] 10/12/2011  . Obesity affecting pregnancy, antepartum [O99.210] 10/12/2011  . Bipolar affective disorder (HCC) [F31.9] 10/12/2011  . History of cesarean section [Z98.891] 10/12/2011  . SNORING [R06.09, R09.89] 07/23/2007  . EDEMA [R60.9] 06/11/2007  . MORBID OBESITY [E66.01] 12/23/2006  . BIPOLAR AFFECTIVE DISORDER [F31.9] 12/23/2006  . ANXIETY [F41.1] 12/23/2006  . DEPRESSION [F32.9] 12/23/2006  . GERD [K21.9] 12/23/2006  . MILD/UNSPEC PRE-ECLAMPSIA UNSPEC AS EPISODE CARE [IMO0002] 12/23/2006  . Fibromyalgia [M79.7] 12/23/2006   History of Present Illness:    Pt is a 36 year-old morbidly obese female who presented for  follow-up appointment. She reported that she Stopped taking the Effexor last week after she was having  suicidal ideations and feeling anxiety on the medication. She called the nurse on line who advised her to stop the medication. She abruptly stopped the medication and was going through the withdrawal symptoms of 4 days. She reported that she now she is doing better. Her anxiety has improved. She is very excited as she is going to have the gastric bypass surgery next Thursday. She reported that her physician has advised her to take only the chewable medications. We discussed about her medications at length. She can only take the lamotrigine which is in the chewable form. She has to stop the topiramate and the Abilify as they do not come in the liquid or the chewable form. She is agreeable with the medication changes at this time. Patient reported that she is taking the liquid diet at this time and has already lost almost 20 pounds. Her family is supportive. She is going to have the surgery at the [facility].      She reported that she is compliant with her medications and has refills available. She currently denied having any side effects of the medications. Patient reported that she is excited that she will lose weight after the surgery.     Past Medical History:  Past Medical History:  Diagnosis Date  . Abdominal pain, other specified site   . Anxiety state, unspecified   . Bipolar disorder, unspecified   . Depressive disorder, not elsewhere classified   . Edema   . Esophageal reflux   . History of kidney stones   . Mild or unspecified pre-eclampsia, unspecified as to episode of care   . Morbid obesity (HCC)   . Myalgia and myositis, unspecified   . Other dyspnea and respiratory abnormality   . Preeclampsia  with first pregnancy  . Urinary tract infection, site not specified     Past Surgical History:  Procedure Laterality Date  . CESAREAN SECTION    . CHOLECYSTECTOMY  03/2006  . LITHOTRIPSY    . TONSILLECTOMY AND ADENOIDECTOMY     Social History:   Social History   Social  History  . Marital status: Married    Spouse name: N/A  . Number of children: 1  . Years of education: N/A   Occupational History  . Home maker    Social History Main Topics  . Smoking status: Never Smoker  . Smokeless tobacco: Never Used  . Alcohol use 0.0 - 0.6 oz/week     Comment: occasional < 1 month  . Drug use: No  . Sexual activity: Yes    Partners: Male    Birth control/ protection: None, IUD   Other Topics Concern  . None   Social History Narrative   1 child, 2 step-sons      Regular exercise-no   Diet: no fast food, likes sweets   Additional Social History:  Married X 11 years. Has  Two sons ages  50 and 14.  Has 2 step sons ages 48 and 41. 68 yo is in Hotel manager and is married. 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  Husband is a retired Administrator, [employer] Service and has PTSD.    Musculoskeletal: Strength & Muscle Tone: within normal limits Gait & Station: normal Patient leans: N/A  Psychiatric Specialty Exam: Review of Systems  Constitutional: Positive for malaise/fatigue and weight loss.  Eyes: Negative.   Respiratory: Negative.   Cardiovascular: Negative for palpitations and leg swelling.  Gastrointestinal: Negative.   Genitourinary: Negative.   Musculoskeletal: Negative for back pain, joint pain and neck pain.  Skin: Negative for rash.  Neurological: Negative.   Psychiatric/Behavioral: Positive for depression. Negative for suicidal ideas. The patient is nervous/anxious. The patient does not have insomnia.     Blood pressure 133/83, pulse (!) 102, temperature 97.4 F (36.3 C), temperature source Oral, weight (!) 377 lb 3.2 oz (171.1 kg), last menstrual period 03/19/2016.Body mass index is 64.75 kg/m.  General Appearance: Casual  Eye Contact:  Fair  Speech:  Clear and Coherent  Volume:  Normal  Mood:  Euthymic  Affect:  Congruent  Thought Process:  Coherent  Orientation:  Full (Time, Place, and Person)  Thought Content:  WDL  Suicidal Thoughts:  No  Homicidal Thoughts:  No   Memory:  Immediate;   Fair  Judgement:  Fair  Insight:  Fair  Psychomotor Activity:  Normal  Concentration:  Fair  Recall:  [employer] of Knowledge:Fair  Language: Fair  Akathisia:  No  Handed:  Right  AIMS (if indicated):    Assets:  Communication Skills Desire for Improvement Social Support  ADL's:  Intact  Cognition: WNL  Sleep:  Difficulty going to sleep   Is the patient at risk to self?  No. Has the patient been a risk to self in the past 6 months?  No. Has the patient been a risk to self within the distant past?  No. Is the patient a risk to others?  No. Has the patient been a risk to others in the past 6 months?  No. Has the patient been a risk to others within the distant past?  No.  Allergies:  No Known Allergies Current Medications: Current Outpatient Prescriptions  Medication Sig Dispense Refill  . amLODipine (NORVASC) 5 MG tablet Take 5 mg by mouth daily.  3  . ARIPiprazole (ABILIFY) 20 MG tablet Take 1 tablet (20 mg total) by mouth 3 (three) times a week. Monday, wed, friday 90 tablet 1  . cholecalciferol (VITAMIN D) 1000 units tablet Take 5,000 Units by mouth daily.    . IRON PO Take 130 mg by mouth daily.    Marland Kitchen. lamoTRIgine (LAMICTAL) 150 MG tablet Take 1 tablet (150 mg total) by mouth daily. 90 tablet 1  . Multiple Vitamin (MULTIVITAMIN) tablet Take 1 tablet by mouth daily.    Marland Kitchen. NORA-BE 0.35 MG tablet Take 1 tablet by mouth daily.  0  . PARAGARD INTRAUTERINE COPPER IU by Intrauterine route.    Marland Kitchen. SAVELLA 50 MG TABS tablet Take 100 mg by mouth 2 (two) times daily.  0  . topiramate (TOPAMAX) 200 MG tablet Take 1 tablet (200 mg total) by mouth daily. 90 tablet 1  . venlafaxine XR (EFFEXOR-XR) 150 MG 24 hr capsule Take 1 capsule (150 mg total) by mouth daily with breakfast. 30 capsule 1  . Vitamin D, Ergocalciferol, (DRISDOL) 50000 units CAPS capsule Take 50,000 Units by mouth every 7 (seven) days.     No current facility-administered medications for this visit.     Facility-Administered Medications Ordered in Other Visits  Medication Dose Route Frequency Provider Last Rate Last Dose  . technetium sestamibi (CARDIOLITE) injection 30 millicurie  30 millicurie Intravenous Once PRN [employer] Register Jr., MD          Substance Abuse History in the last 12 months:  No.  Consequences of Substance Abuse: NA  Medical Decision Making:  Review of Psycho-Social Stressors (1) and Established Problem, Worsening (2)  Treatment Plan Summary: Medication management   Depression Will gradually taper her self out of the Abilify in the next few days.  Mood stabilization I will change the lamotrigine chewable 100 mg daily  Binge eating disorder She will gradually taper herself out of the Topamax in the next few days.  patient has already stopped the Effexor.  Patient is going for the surgery next week. She will come back after the surgery in 2 months or earlier depending on her symptoms.   Discussed with her about the side effects of medications and she demonstrated understanding     More than 50% of the time spent in psychoeducation, counseling and coordination of care.     This note was generated in part or whole with voice recognition software. Voice regonition is usually quite accurate but there are transcription errors that can and very often do occur. I apologize for any typographical errors that were not detected and corrected.    Brandy HaleUzma Renley Banwart, MD  3/7/201810:29 AM

## 2016-03-26 HISTORY — PX: IVC FILTER INSERTION: CATH118245

## 2016-04-25 ENCOUNTER — Observation Stay
Admission: EM | Admit: 2016-04-25 | Discharge: 2016-04-28 | Disposition: A | Discharge status: Home / Self Care | Attending: Urology | Admitting: Urology

## 2016-04-25 ENCOUNTER — Encounter
Admission: EM | Disposition: A | Discharge status: Home / Self Care | Payer: Self-pay | Source: Home / Self Care | Attending: Emergency Medicine

## 2016-04-25 ENCOUNTER — Emergency Department: Admitting: Anesthesiology

## 2016-04-25 ENCOUNTER — Emergency Department

## 2016-04-25 DIAGNOSIS — R319 Hematuria, unspecified: Secondary | ICD-10-CM | POA: Diagnosis not present

## 2016-04-25 DIAGNOSIS — G5601 Carpal tunnel syndrome, right upper limb: Secondary | ICD-10-CM | POA: Insufficient documentation

## 2016-04-25 DIAGNOSIS — K219 Gastro-esophageal reflux disease without esophagitis: Secondary | ICD-10-CM | POA: Insufficient documentation

## 2016-04-25 DIAGNOSIS — M797 Fibromyalgia: Secondary | ICD-10-CM | POA: Diagnosis not present

## 2016-04-25 DIAGNOSIS — N136 Pyonephrosis: Secondary | ICD-10-CM | POA: Insufficient documentation

## 2016-04-25 DIAGNOSIS — F319 Bipolar disorder, unspecified: Secondary | ICD-10-CM | POA: Insufficient documentation

## 2016-04-25 DIAGNOSIS — N201 Calculus of ureter: Principal | ICD-10-CM | POA: Diagnosis present

## 2016-04-25 DIAGNOSIS — N390 Urinary tract infection, site not specified: Secondary | ICD-10-CM

## 2016-04-25 DIAGNOSIS — Z9884 Bariatric surgery status: Secondary | ICD-10-CM | POA: Insufficient documentation

## 2016-04-25 DIAGNOSIS — Z87442 Personal history of urinary calculi: Secondary | ICD-10-CM | POA: Diagnosis not present

## 2016-04-25 DIAGNOSIS — N200 Calculus of kidney: Secondary | ICD-10-CM | POA: Diagnosis present

## 2016-04-25 DIAGNOSIS — R000 Tachycardia, unspecified: Secondary | ICD-10-CM | POA: Diagnosis not present

## 2016-04-25 DIAGNOSIS — F419 Anxiety disorder, unspecified: Secondary | ICD-10-CM | POA: Insufficient documentation

## 2016-04-25 DIAGNOSIS — N138 Other obstructive and reflux uropathy: Secondary | ICD-10-CM | POA: Diagnosis present

## 2016-04-25 DIAGNOSIS — I10 Essential (primary) hypertension: Secondary | ICD-10-CM | POA: Diagnosis not present

## 2016-04-25 HISTORY — PX: CYSTOSCOPY WITH STENT PLACEMENT: SHX5790

## 2016-04-25 HISTORY — DX: Calculus of ureter: N20.1

## 2016-04-25 LAB — LIPASE, BLOOD: LIPASE: 32 U/L (ref 11–51)

## 2016-04-25 LAB — BASIC METABOLIC PANEL
Anion gap: 8 (ref 5–15)
BUN: 15 mg/dL (ref 6–20)
CHLORIDE: 108 mmol/L (ref 101–111)
CO2: 22 mmol/L (ref 22–32)
CREATININE: 0.92 mg/dL (ref 0.44–1.00)
Calcium: 9.7 mg/dL (ref 8.9–10.3)
GFR calc Af Amer: >60 mL/min (ref >60)
GFR calc non Af Amer: >60 mL/min (ref >60)
Glucose, Bld: 94 mg/dL (ref 65–99)
POTASSIUM: 3.9 mmol/L (ref 3.5–5.1)
SODIUM: 138 mmol/L (ref 135–145)

## 2016-04-25 LAB — CBC
HEMATOCRIT: 43 % (ref 35.0–47.0)
Hemoglobin: 14.5 g/dL (ref 12.0–16.0)
MCH: 29.3 pg (ref 26.0–34.0)
MCHC: 33.9 g/dL (ref 32.0–36.0)
MCV: 86.6 fL (ref 80.0–100.0)
PLATELETS: 195 10*3/uL (ref 150–440)
RBC: 4.96 MIL/uL (ref 3.80–5.20)
RDW: 14.4 % (ref 11.5–14.5)
WBC: 5.8 10*3/uL (ref 3.6–11.0)

## 2016-04-25 LAB — URINALYSIS, COMPLETE (UACMP) WITH MICROSCOPIC
BILIRUBIN URINE: NEGATIVE
Glucose, UA: NEGATIVE mg/dL
Ketones, ur: 20 mg/dL — AB
NITRITE: POSITIVE — AB
PH: 5 (ref 5.0–8.0)
Protein, ur: 30 mg/dL — AB
SPECIFIC GRAVITY, URINE: 1.021 (ref 1.005–1.030)

## 2016-04-25 LAB — HEPATIC FUNCTION PANEL
ALT: 34 U/L (ref 14–54)
AST: 31 U/L (ref 15–41)
Albumin: 3.9 g/dL (ref 3.5–5.0)
Alkaline Phosphatase: 70 U/L (ref 38–126)
BILIRUBIN DIRECT: 0.1 mg/dL (ref 0.1–0.5)
Indirect Bilirubin: 0.8 mg/dL (ref 0.3–0.9)
Total Bilirubin: 0.9 mg/dL (ref 0.3–1.2)
Total Protein: 7.1 g/dL (ref 6.5–8.1)

## 2016-04-25 LAB — POCT PREGNANCY, URINE: Preg Test, Ur: NEGATIVE

## 2016-04-25 SURGERY — CYSTOSCOPY, WITH STENT INSERTION
Anesthesia: General | Site: Ureter | Laterality: Left | Wound class: Clean Contaminated

## 2016-04-25 MED ORDER — FENTANYL CITRATE (PF) 100 MCG/2ML IJ SOLN: 25.0000 ug | INTRAMUSCULAR | Status: DC | PRN

## 2016-04-25 MED ORDER — SODIUM CHLORIDE 0.9 % IV SOLN
INTRAVENOUS | Status: DC | PRN
  Administered 2016-04-25: 23:00:00 via INTRAVENOUS

## 2016-04-25 MED ORDER — OXYCODONE-ACETAMINOPHEN 5-325 MG PO TABS
1.0000 | ORAL_TABLET | ORAL | Status: DC | PRN
  Administered 2016-04-25: 1 via ORAL
  Filled 2016-04-25: qty 1

## 2016-04-25 MED ORDER — ONDANSETRON 4 MG PO TBDP
ORAL_TABLET | ORAL | Status: AC
  Filled 2016-04-25: qty 1

## 2016-04-25 MED ORDER — SUCCINYLCHOLINE CHLORIDE 20 MG/ML IJ SOLN
INTRAMUSCULAR | Status: DC | PRN
  Administered 2016-04-25: 140 mg via INTRAVENOUS

## 2016-04-25 MED ORDER — FENTANYL CITRATE (PF) 100 MCG/2ML IJ SOLN
INTRAMUSCULAR | Status: DC | PRN
  Administered 2016-04-25: 50 ug via INTRAVENOUS

## 2016-04-25 MED ORDER — OXYCODONE-ACETAMINOPHEN 5-325 MG PO TABS
ORAL_TABLET | ORAL | Status: AC
  Filled 2016-04-25: qty 1

## 2016-04-25 MED ORDER — ONDANSETRON HCL 4 MG/2ML IJ SOLN: INTRAMUSCULAR | Status: DC | PRN

## 2016-04-25 MED ORDER — OXYCODONE HCL 5 MG/5ML PO SOLN: 5.0000 mg | Freq: Once | ORAL | Status: DC | PRN

## 2016-04-25 MED ORDER — OXYCODONE HCL 5 MG PO TABS: 5.0000 mg | ORAL_TABLET | Freq: Once | ORAL | Status: DC | PRN

## 2016-04-25 MED ORDER — HYDROMORPHONE HCL 1 MG/ML IJ SOLN
1.0000 mg | Freq: Once | INTRAMUSCULAR | Status: AC
  Administered 2016-04-25: 1 mg via INTRAVENOUS
  Filled 2016-04-25: qty 1

## 2016-04-25 MED ORDER — SODIUM CHLORIDE 0.9 % IV BOLUS (SEPSIS)
1000.0000 mL | Freq: Once | INTRAVENOUS | Status: AC
  Administered 2016-04-25: 1000 mL via INTRAVENOUS

## 2016-04-25 MED ORDER — PROPOFOL 10 MG/ML IV BOLUS
INTRAVENOUS | Status: DC | PRN
  Administered 2016-04-25: 200 mg via INTRAVENOUS

## 2016-04-25 MED ORDER — DEXTROSE 5 % IV SOLN: 1.0000 g | Freq: Once | INTRAVENOUS | Status: DC

## 2016-04-25 MED ORDER — ONDANSETRON HCL 4 MG/2ML IJ SOLN
4.0000 mg | Freq: Once | INTRAMUSCULAR | Status: AC
  Administered 2016-04-25: 4 mg via INTRAVENOUS
  Filled 2016-04-25: qty 2

## 2016-04-25 MED ORDER — MEPERIDINE HCL 50 MG/ML IJ SOLN: 6.2500 mg | INTRAMUSCULAR | Status: DC | PRN

## 2016-04-25 MED ORDER — ONDANSETRON HCL 4 MG/2ML IJ SOLN
INTRAMUSCULAR | Status: DC | PRN
  Administered 2016-04-25: 4 mg via INTRAVENOUS

## 2016-04-25 MED ORDER — CEFTRIAXONE SODIUM-DEXTROSE 1-3.74 GM-% IV SOLR
1.0000 g | Freq: Once | INTRAVENOUS | Status: AC
Start: 2016-04-25 — End: 2016-04-25
  Administered 2016-04-25: 1 g via INTRAVENOUS
  Filled 2016-04-25 (×2): qty 50

## 2016-04-25 MED ORDER — PROMETHAZINE HCL 25 MG/ML IJ SOLN
6.2500 mg | INTRAMUSCULAR | Status: DC | PRN
  Administered 2016-04-26: 6.25 mg via INTRAVENOUS

## 2016-04-25 MED ORDER — IOTHALAMATE MEGLUMINE 43 % IV SOLN
INTRAVENOUS | Status: DC | PRN
  Administered 2016-04-25: 15 mL via URETHRAL

## 2016-04-25 MED ORDER — ONDANSETRON 4 MG PO TBDP
4.0000 mg | ORAL_TABLET | Freq: Once | ORAL | Status: AC
  Administered 2016-04-25: 4 mg via ORAL

## 2016-04-25 SURGICAL SUPPLY — 20 items
BAG DRAIN CYSTO-URO LG1000N (MISCELLANEOUS) ×2 IMPLANT
CATH URETL 5X70 OPEN END (CATHETERS) ×2 IMPLANT
CONRAY 43 FOR UROLOGY 50M (MISCELLANEOUS) ×2 IMPLANT
GLOVE BIO SURGEON STRL SZ 6.5 (GLOVE) ×4 IMPLANT
GOWN STRL REUS W/ TWL LRG LVL3 (GOWN DISPOSABLE) ×2 IMPLANT
GOWN STRL REUS W/TWL LRG LVL3 (GOWN DISPOSABLE) ×4
KIT RM TURNOVER CYSTO AR (KITS) ×2 IMPLANT
PACK CYSTO AR (MISCELLANEOUS) ×2 IMPLANT
SCRUB POVIDONE IODINE 4 OZ (MISCELLANEOUS) IMPLANT
SENSORWIRE 0.038 NOT ANGLED (WIRE) ×2
SET CYSTO W/LG BORE CLAMP LF (SET/KITS/TRAYS/PACK) ×2 IMPLANT
SOL .9 NS 3000ML IRR  AL (IV SOLUTION) ×1
SOL .9 NS 3000ML IRR AL (IV SOLUTION) ×1
SOL .9 NS 3000ML IRR UROMATIC (IV SOLUTION) ×1 IMPLANT
STENT URET 6FRX24 CONTOUR (STENTS) ×2 IMPLANT
STENT URET 6FRX26 CONTOUR (STENTS) IMPLANT
SURGILUBE 2OZ TUBE FLIPTOP (MISCELLANEOUS) ×2 IMPLANT
SYRINGE IRR TOOMEY STRL 70CC (SYRINGE) ×2 IMPLANT
WATER STERILE IRR 1000ML POUR (IV SOLUTION) ×2 IMPLANT
WIRE SENSOR 0.038 NOT ANGLED (WIRE) ×1 IMPLANT

## 2016-04-25 NOTE — ED Triage Notes (Signed)
Pt c/o left flank pain with N/V that started around noon today with a hx of kidney stones.

## 2016-04-25 NOTE — Anesthesia Procedure Notes (Signed)
Procedure Name: Intubation Date/Time: 04/25/2016 10:55 PM Performed by: Waldo Laine Pre-anesthesia Checklist: Patient identified, Patient being monitored, Timeout performed, Emergency Drugs available and Suction available Patient Re-evaluated:Patient Re-evaluated prior to inductionOxygen Delivery Method: Circle system utilized Preoxygenation: Pre-oxygenation with 100% oxygen Intubation Type: IV induction Ventilation: Mask ventilation without difficulty Laryngoscope Size: Miller and 2 Grade View: Grade I Tube type: Oral Tube size: 7.0 mm Number of attempts: 1 Airway Equipment and Method: Stylet Placement Confirmation: ETT inserted through vocal cords under direct vision,  positive ETCO2 and breath sounds checked- equal and bilateral Secured at: 20 cm Tube secured with: Tape Dental Injury: Teeth and Oropharynx as per pre-operative assessment

## 2016-04-25 NOTE — ED Notes (Signed)
MD at bedside placing ultrasound IV.

## 2016-04-25 NOTE — ED Provider Notes (Signed)
[facility] Emergency Department Provider Note  ____________________________________________  Time seen: Approximately 9:35 PM  I have reviewed the triage vital signs and the nursing notes.   HISTORY  Chief Complaint Flank Pain    HPI Sierra [name] is a 36 y.o. female who complains of rapid onset left flank pain starting at about noon today. It's been constant and severe, no alleviating or aggravating factors. Denies fever. Positive vomiting. Feels like previous episodes of kidney stones that she's had. She's had to have lithotripsy in the past     Past Medical History:  Diagnosis Date  . Abdominal pain, other specified site   . Anxiety state, unspecified   . Bipolar disorder, unspecified   . Depressive disorder, not elsewhere classified   . Edema   . Esophageal reflux   . History of kidney stones   . Mild or unspecified pre-eclampsia, unspecified as to episode of care   . Morbid obesity (HCC)   . Myalgia and myositis, unspecified   . Other dyspnea and respiratory abnormality   . Preeclampsia    with first pregnancy  . Urinary tract infection, site not specified      Patient Active Problem List   Diagnosis Date Noted  . Tingling in extremities 08/29/2015  . Acute nonintractable headache 08/29/2015  . Visual disturbance 08/23/2015  . Polydipsia 03/23/2014  . Binge eating disorder 03/23/2014  . Vaginitis and vulvovaginitis 12/31/2013  . Paronychia 09/21/2013  . Essential hypertension, benign 06/05/2013  . Bilateral leg pain 05/22/2013  . Right carpal tunnel syndrome 09/16/2012  . General counseling and advice on female contraception 01/22/2012  . Anhydramnios 01/02/2012  . Umbilical cord complication 12/07/2011  . High-risk pregnancy 11/09/2011  . 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 H/O cesarean section 10/12/2011  . Calculus of kidney 10/12/2011  . Obesity affecting pregnancy, antepartum 10/12/2011  . Bipolar affective  disorder (HCC) 10/12/2011  . History of cesarean section 10/12/2011  . SNORING 07/23/2007  . EDEMA 06/11/2007  . MORBID OBESITY 12/23/2006  . BIPOLAR AFFECTIVE DISORDER 12/23/2006  . ANXIETY 12/23/2006  . DEPRESSION 12/23/2006  . GERD 12/23/2006  . MILD/UNSPEC PRE-ECLAMPSIA UNSPEC AS EPISODE CARE 12/23/2006  . Fibromyalgia 12/23/2006     Past Surgical History:  Procedure Laterality Date  . CESAREAN SECTION    . CHOLECYSTECTOMY  03/2006  . LITHOTRIPSY    . TONSILLECTOMY AND ADENOIDECTOMY       Prior to Admission medications   Medication Sig Start Date End Date Taking? Authorizing Provider  amLODipine (NORVASC) 5 MG tablet Take 5 mg by mouth daily. 03/01/15   Historical Provider, MD  cholecalciferol (VITAMIN D) 1000 units tablet Take 5,000 Units by mouth daily.    Historical Provider, MD  IRON PO Take 130 mg by mouth daily.    Historical Provider, MD  lamoTRIgine (LAMICTAL) 25 MG CHEW chewable tablet Chew 4 tablets (100 mg total) by mouth daily. 03/21/16   Brandy Hale, MD  Multiple Vitamin (MULTIVITAMIN) tablet Take 1 tablet by mouth daily.    Historical Provider, MD  NORA-BE 0.35 MG tablet Take 1 tablet by mouth daily. 10/21/14   Historical Provider, MD  PARAGARD INTRAUTERINE COPPER IU by Intrauterine route.    Historical Provider, MD  SAVELLA 50 MG TABS tablet Take 100 mg by mouth 2 (two) times daily. 06/15/14   Historical Provider, MD  Vitamin D, Ergocalciferol, (DRISDOL) 50000 units CAPS capsule Take 50,000 Units by mouth every 7 (seven) days.    Historical Provider, MD  Allergies Patient has no known allergies.   Family History  Problem Relation Age of Onset  . Depression Father   . Alcohol abuse Father   . Depression Mother   . Hyperlipidemia Mother   . Sleep apnea Mother   . Depression Sister   . Hyperlipidemia Brother   . Depression Brother   . Hyperlipidemia Brother   . Depression Brother   . Alcohol abuse Brother   . Coronary artery disease Maternal  Grandmother   . Heart attack Maternal Grandmother   . Diabetes Maternal Grandmother   . Lung cancer Maternal Grandfather   . Emphysema Maternal Grandfather   . Coronary artery disease Maternal Grandfather   . Lupus      Aunt  . Fibromyalgia      Aunt    Social History Social History  Substance Use Topics  . Smoking status: Never Smoker  . Smokeless tobacco: Never Used  . Alcohol use 0.0 - 0.6 oz/week     Comment: occasional < 1 month    Review of Systems  Constitutional:   No fever or chills.  ENT:   No sore throat. No rhinorrhea. Cardiovascular:   No chest pain. Respiratory:   No dyspnea or cough. Gastrointestinal:   Positive left flank pain with vomiting.  Genitourinary:   Negative for dysuria or difficulty urinating. Musculoskeletal:   Negative for focal pain or swelling Neurological:   Negative for headaches 10-point ROS otherwise negative.  ____________________________________________   PHYSICAL EXAM:  VITAL SIGNS: ED Triage Vitals  Enc Vitals Group     BP 04/25/16 1634 119/76     Pulse Rate 04/25/16 1632 (!) 109     Resp 04/25/16 1632 18     Temp 04/25/16 1632 99.1 F (37.3 C)     Temp Source 04/25/16 1632 Oral     SpO2 04/25/16 1632 100 %     Weight 04/25/16 1633 (!) 360 lb (163.3 kg)     Height 04/25/16 1633  (1.626 m)     Head Circumference --      Peak Flow --      Pain Score 04/25/16 1632 9     Pain Loc --      Pain Edu? --      Excl. in GC? --     Vital signs reviewed, nursing assessments reviewed.   Constitutional:   Alert and oriented.Very uncomfortable Eyes:   No scleral icterus. No conjunctival pallor. PERRL. EOMI.  No nystagmus. ENT   Head:   Normocephalic and atraumatic.   Nose:   No congestion/rhinnorhea. No septal hematoma   Mouth/Throat:   MMM, no pharyngeal erythema. No peritonsillar mass.    Neck:   No stridor. No SubQ emphysema. No meningismus. Hematological/Lymphatic/Immunilogical:   No cervical  lymphadenopathy. Cardiovascular:   RRR. Symmetric bilateral radial and DP pulses.  No murmurs.  Respiratory:   Normal respiratory effort without tachypnea nor retractions. Breath sounds are clear and equal bilaterally. No wheezes/rales/rhonchi. Gastrointestinal:   Soft with left mid abdominal tenderness. Non distended. There is left-sided CVA tenderness.  No rebound, rigidity, or guarding. Genitourinary:   deferred Musculoskeletal:   Normal range of motion in all extremities. No joint effusions.  No lower extremity tenderness.  No edema. Neurologic:   Normal speech and language.  CN 2-10 normal. Motor grossly intact. No gross focal neurologic deficits are appreciated.  Skin:    Skin is warm, dry and intact. No rash noted.  No petechiae, purpura, or bullae.  ____________________________________________  LABS (pertinent positives/negatives) (all labs ordered are listed, but only abnormal results are displayed) Labs Reviewed  URINALYSIS, COMPLETE (UACMP) WITH MICROSCOPIC - Abnormal; Notable for the following:       Result Value   Color, Urine YELLOW (*)    APPearance CLOUDY (*)    Hgb urine dipstick MODERATE (*)    Ketones, ur 20 (*)    Protein, ur 30 (*)    Nitrite POSITIVE (*)    Leukocytes, UA LARGE (*)    Bacteria, UA MANY (*)    Squamous Epithelial / LPF TOO NUMEROUS TO COUNT (*)    Non Squamous Epithelial 0-5 (*)    All other components within normal limits  URINE CULTURE  BASIC METABOLIC PANEL  CBC  HEPATIC FUNCTION PANEL  LIPASE, BLOOD  POCT PREGNANCY, URINE   ____________________________________________   EKG    ____________________________________________    RADIOLOGY  Ct Renal Stone Study  Result Date: 04/25/2016 CLINICAL DATA:  LEFT flank pain, nausea and vomiting. EXAM: CT ABDOMEN AND PELVIS WITHOUT CONTRAST TECHNIQUE: Multidetector CT imaging of the abdomen and pelvis was performed following the standard protocol without IV contrast. COMPARISON:  CT  01/27/2012 FINDINGS: Lower chest: Lung bases are clear. Hepatobiliary: No focal hepatic lesion. Postcholecystectomy. No biliary dilatation. Pancreas: Pancreas is normal. No ductal dilatation. No pancreatic inflammation. Spleen: Normal spleen Adrenals/urinary tract: Adrenal glands are normal. Obstructing calculus in the proximal LEFT ureter at the ureteral pelvic junction. This calculus measures 11 mm in craniocaudad dimension and 8 mm in axial dimension (image 45, series 2). This calculus is evident on the CT topogram LEFT of the L3 vertebral body. No RIGHT ureteral calculi.  No bladder calculi. Mild hydronephrosis proximal obstructing LEFT ureteral calculus. Stomach/Bowel: Post gastric sleeve anatomy. Duodenum small bowel are normal. The appendix and colon are normal. Vascular/Lymphatic: Abdominal aorta is normal caliber. There is no retroperitoneal or periportal lymphadenopathy. No pelvic lymphadenopathy. Reproductive: Uterus and ovaries normal.  IUD in expected location Other: Focal umbilical hernia measuring 3.7 cm. Narrow mouth midline. Musculoskeletal: No aggressive osseous lesion. IMPRESSION: 1. Large obstructing calculus within the proximal LEFT ureter at the ureteropelvic junction. Mild hydronephrosis. 2. Calculus evident on the CT topogram. Electronically Signed   By: Genevive Bi M.D.   On: 04/25/2016 20:25    ____________________________________________   PROCEDURES Procedures Peripheral IV insertion by physician Indication: Multiple failed attempts by nursing staff, need for IV access and/or blood samples for workup Performed under continuous real-time ultrasound visualization Area cleaned with chlorhexidine. 20-gauge IV placed in the left antecubital fossa. Unable to fully advance catheter. Secured in place to forearm with tegaderm and tape. 4 attempts, no complications, EBL 0.   ____________________________________________   INITIAL IMPRESSION / ASSESSMENT AND PLAN / ED  COURSE  Pertinent labs & imaging results that were available during my care of the patient were reviewed by me and considered in my medical decision making (see chart for details).  Patient presents with severe flank pain, concerning for kidney stone. Urinalysis also positive for nitrite positive UTI. Ordered for CT Dilaudid Zofran and fluids. IV ceftriaxone     Clinical Course as of Apr 25 2133  Wed Apr 25, 2016  2101 CT + large stone. Urology paged  [PS]  2133 D/w urology. Dr. Apolinar Junes will plan to take to OR tonight. Remain NPO.   [PS]    Clinical Course User Index [PS] Sharman Cheek, MD     ____________________________________________   FINAL CLINICAL IMPRESSION(S) / ED DIAGNOSES  Final diagnoses:  Urinary tract obstruction by kidney stone  Urinary tract infection with hematuria, site unspecified      New Prescriptions   No medications on file     Portions of this note were generated with dragon dictation software. Dictation errors may occur despite best attempts at proofreading.    Sharman Cheek, MD 04/25/16 2142

## 2016-04-25 NOTE — Anesthesia Procedure Notes (Signed)
Performed by: Kerim Statzer       

## 2016-04-25 NOTE — Anesthesia Post-op Follow-up Note (Cosign Needed)
Anesthesia QCDR form completed.        

## 2016-04-25 NOTE — Discharge Instructions (Signed)
You have a ureteral stent in place.  This is a tube that extends from your kidney to your bladder.  This may cause urinary bleeding, burning with urination, and urinary frequency.  Please call our office or present to the ED if you develop fevers >101 or pain which is not able to be controlled with oral pain medications.  You may be given either Flomax and/ or ditropan to help with bladder spasms and stent pain in addition to pain medications.   ° °[facility] °[address], [address] °[place], [place] [address] °[phone] °

## 2016-04-25 NOTE — H&P (Signed)
Urology Consult/ admission H&P  I have been asked to see the patient by Dr. Scotty Court, for evaluation and management of obstructive left ureteral stone.  Chief Complaint: Left flank pain  History of Present Illness: Sierra [name] is a 36 y.o. year old with multiple medical comorbidities including recent duodenal switch surgery who presents to the emergency room today with acute onset left flank pain. She was found have 11 mm proximal left obstructing ureteral calculus with mild hydronephrosis. Her UA also was highly suspicious for urinary tract infection, nitrate positive.  In the emergency room, she is mildly tachycardic and her pain is difficult to control.  She denies any fevers, chills, dysuria, gross hematuria. She does note that her urine has been particularly cloudy over the past few days. She is also had 1 episode of emesis related to the pain.  She does have a personal history of kidney stones and has passed multiple stones spontaneously over the past several years. Her first stone was at age 29 requiring shockwave lithotripsy. She also has a strong family history of stones.  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    Past Medical History:  Diagnosis Date  . Abdominal pain, other specified site   . Anxiety state, unspecified   . Bipolar disorder, unspecified   . Depressive disorder, not elsewhere classified   . Edema   . Esophageal reflux   . History of kidney stones   . Mild or unspecified pre-eclampsia, unspecified as to episode of care   . Morbid obesity (HCC)   . Myalgia and myositis, unspecified   . Other dyspnea and respiratory abnormality   . Preeclampsia    with first pregnancy  . Urinary tract infection, site not specified     Past Surgical History:  Procedure Laterality Date  . CESAREAN SECTION    . CHOLECYSTECTOMY  03/2006  . LITHOTRIPSY    . TONSILLECTOMY AND ADENOIDECTOMY      Home Medications:    Current Meds  Medication Sig  . Biotin 10 MG CAPS Take 10 mg by mouth daily.  . calcium-vitamin D 250-100 MG-UNIT tablet Take 1 tablet by mouth 2 (two) times daily.  . cyclobenzaprine (FLEXERIL) 10 MG tablet Take 10 mg by mouth 3 (three) times daily as needed for muscle spasms.  Marland Kitchen lamoTRIgine (LAMICTAL) 25 MG CHEW chewable tablet Chew 4 tablets (100 mg total) by mouth daily.  Marland Kitchen omeprazole (PRILOSEC) 20 MG capsule Take 20 mg by mouth 2 (two) times daily.    Allergies: No Known Allergies  Family History  Problem Relation Age of Onset  . Depression Father   . Alcohol abuse Father   . Depression Mother   . Hyperlipidemia Mother   . Sleep apnea Mother   . Depression Sister   . Hyperlipidemia Brother   . Depression Brother   . Hyperlipidemia Brother   . Depression Brother   . Alcohol abuse Brother   . Coronary artery disease Maternal Grandmother   . Heart attack Maternal Grandmother   . Diabetes Maternal Grandmother   . Lung cancer Maternal Grandfather   . Emphysema Maternal Grandfather   . Coronary artery disease Maternal Grandfather   . Lupus      Aunt  . Fibromyalgia      Aunt    Social History:  reports that she has never smoked. She has never used smokeless tobacco. She reports that she drinks alcohol. She reports that she does not use drugs.  ROS: A complete review of systems was performed.  All systems are negative except for pertinent findings as noted.  Physical Exam:  Vital signs in last 24 hours: Temp:  [99.1 F (37.3 C)] 99.1 F (37.3 C) (04/11 1632) Pulse Rate:  [88-109] 98 (04/11 2133) Resp:  [18-20] 18 (04/11 2133) BP: (119-144)/(75-85) 130/75 (04/11 2133) SpO2:  [97 %-100 %] 97 % (04/11 2133) Weight:  [360 lb (163.3 kg)] 360 lb (163.3 kg) (04/11 1633) Constitutional:  Alert and oriented, No acute distress.  Accompanied by husband. HEENT: [place] AT, moist mucus membranes.  Trachea midline, no masses Cardiovascular: Regular rate and rhythm, no clubbing,  cyanosis, or edema. Respiratory: Normal respiratory effort, lungs clear bilaterally GI: Abdomen is soft, nontender, nondistended, no abdominal masses.  Morbidly obese. GU: Mild left CVA tenderness. Skin: No rashes, bruises or suspicious lesions Neurologic: Grossly intact, no focal deficits, moving all 4 extremities Psychiatric: Normal mood and affect   Laboratory Data:   Recent Labs  04/25/16 1632  WBC 5.8  HGB 14.5  HCT 43.0    Recent Labs  04/25/16 1632  NA 138  K 3.9  CL 108  CO2 22  GLUCOSE 94  BUN 15  CREATININE 0.92  CALCIUM 9.7   Component     Latest Ref Rng & Units 04/25/2016  Color, Urine     YELLOW YELLOW (A)  Appearance     CLEAR CLOUDY (A)  Specific Gravity, Urine     1.005 - 1.030 1.021  pH     5.0 - 8.0 5.0  Glucose     NEGATIVE mg/dL NEGATIVE  Hgb urine dipstick     NEGATIVE MODERATE (A)  Bilirubin Urine     NEGATIVE NEGATIVE  Ketones, ur     NEGATIVE mg/dL 20 (A)  Protein     NEGATIVE mg/dL 30 (A)  Nitrite     NEGATIVE POSITIVE (A)  Leukocytes, UA     NEGATIVE LARGE (A)  RBC / HPF     0 - 5 RBC/hpf TOO NUMEROUS TO COUNT  WBC, UA     0 - 5 WBC/hpf TOO NUMEROUS TO COUNT  Bacteria, UA     NONE SEEN MANY (A)  Squamous Epithelial / LPF     NONE SEEN TOO NUMEROUS TO COUNT (A)  WBC Clumps      PRESENT  Mucous      PRESENT  Non Squamous Epithelial     NONE SEEN 0-5 (A)    Radiologic Imaging: Ct Renal Stone Study  Result Date: 04/25/2016 CLINICAL DATA:  LEFT flank pain, nausea and vomiting. EXAM: CT ABDOMEN AND PELVIS WITHOUT CONTRAST TECHNIQUE: Multidetector CT imaging of the abdomen and pelvis was performed following the standard protocol without IV contrast. COMPARISON:  CT 01/27/2012 FINDINGS: Lower chest: Lung bases are clear. Hepatobiliary: No focal hepatic lesion. Postcholecystectomy. No biliary dilatation. Pancreas: Pancreas is normal. No ductal dilatation. No pancreatic inflammation. Spleen: Normal spleen Adrenals/urinary tract:  Adrenal glands are normal. Obstructing calculus in the proximal LEFT ureter at the ureteral pelvic junction. This calculus measures 11 mm in craniocaudad dimension and 8 mm in axial dimension (image 45, series 2). This calculus is evident on the CT topogram LEFT of the L3 vertebral body. No RIGHT ureteral calculi.  No bladder calculi. Mild hydronephrosis proximal obstructing LEFT ureteral calculus. Stomach/Bowel: Post gastric sleeve anatomy. Duodenum small bowel are normal. The appendix and colon are normal. Vascular/Lymphatic: Abdominal aorta is normal caliber. There is no retroperitoneal or periportal lymphadenopathy. No pelvic lymphadenopathy.  Reproductive: Uterus and ovaries normal.  IUD in expected location Other: Focal umbilical hernia measuring 3.7 cm. Narrow mouth midline. Musculoskeletal: No aggressive osseous lesion. IMPRESSION: 1. Large obstructing calculus within the proximal LEFT ureter at the ureteropelvic junction. Mild hydronephrosis. 2. Calculus evident on the CT topogram. Electronically Signed   By: Genevive Bi M.D.   On: 04/25/2016 20:25   CT scan was personally reviewed today and with the patient. Of note, she does somewhat atrophic right kidney.  Impression/Assessment:  36 year old female with 11 mm left proximal ureteral calculus, poorly controlled pain, and UA positive for infection. Given all these concerning clinical findings, I recommended proceeding to the operating room for urgent ureteral stent placement for maximal urinary decompression along with admission for IV antibiotics.  She has been NPO for at least 12 hours.  Plan:   1) Obstructing left ureteral calculus- proceed to the operating room for left ureteral stent placement. Consent obtained, risks and benefits reviewed in detail including risk of bleeding, worsening of her infection, damage to surrounding structures, and stent pain. She also understands she will need a second definitive procedure in the future.  2)  UTI- ceftriaxone ordered by the emergency room.  We'll continue this medication. Plan for admission for IV antibiotics following stent placement. Follow-up urine culture.  3) Depression/ bipolar- continue home meds  4) GERD- continue home meds  5) Recent gastric bypass- will continue IV fluids  04/25/2016, 10:24 PM  [name],  MD

## 2016-04-25 NOTE — Transfer of Care (Signed)
Immediate Anesthesia Transfer of Care Note  Patient: Sierra [name]  Procedure(s) Performed: Procedure(s): CYSTOSCOPY WITH STENT PLACEMENT (Left)  Patient Location: PACU  Anesthesia Type:General  Level of Consciousness: awake, alert , oriented and patient cooperative  Airway & Oxygen Therapy: Patient Spontanous Breathing  Post-op Assessment: Report given to RN and Post -op Vital signs reviewed and stable  Post vital signs: Reviewed and stable  Last Vitals:  Vitals:   04/25/16 1918 04/25/16 2133  BP: (!) 144/85 130/75  Pulse: 88 98  Resp: 20 18  Temp:      Last Pain:  Vitals:   04/25/16 2133  TempSrc:   PainSc: 6          Complications: No apparent anesthesia complications

## 2016-04-25 NOTE — Op Note (Signed)
Date of procedure: 04/25/16  Preoperative diagnosis:  1. Left obstructing ureteral calculus 2. UTI   Postoperative diagnosis:  1. Same as above   Procedure: 1. Cystoscopy  2. left retropyelogram 3. Left ureteral stent placement  Surgeon: [name], MD  Anesthesia: General  Complications: None  Intraoperative findings: 11 mm proximal left ureteral stone radiographically identified. Stent placed without complication. Debris within bladder appreciated.  EBL: Minimal   Specimens: None   Drains: 6 x 24 Fr double-J ureteral stent on left  Indication: Sierra [name] is a 36 y.o. patient with and objective left UPJ stone, UTI.  After reviewing the management options for treatment, she elected to proceed with the above surgical procedure(s). We have discussed the potential benefits and risks of the procedure, side effects of the proposed treatment, the likelihood of the patient achieving the goals of the procedure, and any potential problems that might occur during the procedure or recuperation. Informed consent has been obtained.  Description of procedure:  The patient was taken to the operating room and general anesthesia was induced.  The patient was placed in the dorsal lithotomy position, prepped and draped in the usual sterile fashion, and preoperative antibiotics were administered. A preoperative time-out was performed.   A 21 French scope was advanced per urethra into the bladder. Of note, visualization was somewhat poor due to the significant amount of cloudy debris within the urine. Attention was turned to the left ureteral orifice which was easily identified. A scout imaging, the stone could be seen within the proximal ureter easily. A sensor wires in place up to level kidney bypassing the stone without difficulty. A 6 x 24 French double-J ureteral stent was then advanced over the wire up to level of the renal pelvis. Upon advancing the stent, the stone could be seen  retropulsed leaving into the renal pelvis. The wire was then partially withdrawn and a full coil stent within the renal pelvis. A full coil was noted within the bladder. To ensure the appropriate position, a 5 [place] open-ended ureteral catheter was passed alongside of the ureteral stent within the distal ureter and a gentle retrograde pyelogram was performed. This outlined the collecting system beautifully at which time mild left hydronephrosis was noted and a full coil in the renal pelvis. The bladder was then drained and scope was removed. She was cleaned and dried, repositioned in supine position, reversed from anesthesia, taken to the PACU in stable condition.  Plan: She will be admitted for IV antibiotics. Intraoperative details were discussed with her husband.  [name], M.D.

## 2016-04-25 NOTE — Anesthesia Preprocedure Evaluation (Addendum)
Anesthesia Evaluation  Patient identified by MRN, date of birth, ID band Patient awake    Reviewed: Allergy & Precautions, NPO status , Patient's Chart, lab work & pertinent test results  History of Anesthesia Complications Negative for: history of anesthetic complications  Airway Mallampati: I  TM Distance: >3 FB Neck ROM: Full    Dental no notable dental hx.    Pulmonary neg pulmonary ROS, neg sleep apnea, neg COPD,    breath sounds clear to auscultation- rhonchi (-) wheezing      Cardiovascular hypertension, (-) CAD and (-) Past MI  Rhythm:Regular Rate:Normal - Systolic murmurs and - Diastolic murmurs    Neuro/Psych  Headaches, PSYCHIATRIC DISORDERS Anxiety Depression Bipolar Disorder    GI/Hepatic Neg liver ROS, GERD  ,  Endo/Other  negative endocrine ROSneg diabetes  Renal/GU Renal disease: nephrolithiasis.     Musculoskeletal  (+) Fibromyalgia -  Abdominal (+) + obese,   Peds  Hematology negative hematology ROS (+)   Anesthesia Other Findings Past Medical History: No date: Abdominal pain, other specified site No date: Anxiety state, unspecified No date: Bipolar disorder, unspecified No date: Depressive disorder, not elsewhere classified No date: Edema No date: Esophageal reflux No date: History of kidney stones No date: Mild or unspecified pre-eclampsia, unspecified* No date: Morbid obesity (HCC) No date: Myalgia and myositis, unspecified No date: Other dyspnea and respiratory abnormality No date: Preeclampsia     Comment: with first pregnancy No date: Urinary tract infection, site not specified   Reproductive/Obstetrics                             Anesthesia Physical Anesthesia Plan  ASA: II and emergent  Anesthesia Plan: General   Post-op Pain Management:    Induction: Intravenous  Airway Management Planned: Oral ETT  Additional Equipment:   Intra-op Plan:    Post-operative Plan: Extubation in OR  Informed Consent: I have reviewed the patients History and Physical, chart, labs and discussed the procedure including the risks, benefits and alternatives for the proposed anesthesia with the patient or authorized representative who has indicated his/her understanding and acceptance.   Dental advisory given  Plan Discussed with: CRNA and Anesthesiologist  Anesthesia Plan Comments:        Anesthesia Quick Evaluation

## 2016-04-26 DIAGNOSIS — N390 Urinary tract infection, site not specified: Secondary | ICD-10-CM | POA: Diagnosis not present

## 2016-04-26 DIAGNOSIS — N138 Other obstructive and reflux uropathy: Secondary | ICD-10-CM | POA: Diagnosis not present

## 2016-04-26 DIAGNOSIS — R319 Hematuria, unspecified: Secondary | ICD-10-CM | POA: Diagnosis not present

## 2016-04-26 DIAGNOSIS — N200 Calculus of kidney: Secondary | ICD-10-CM | POA: Diagnosis not present

## 2016-04-26 LAB — CBC
HCT: 42.1 % (ref 35.0–47.0)
HEMOGLOBIN: 14.2 g/dL (ref 12.0–16.0)
MCH: 30 pg (ref 26.0–34.0)
MCHC: 33.6 g/dL (ref 32.0–36.0)
MCV: 89.2 fL (ref 80.0–100.0)
Platelets: 171 10*3/uL (ref 150–440)
RBC: 4.73 MIL/uL (ref 3.80–5.20)
RDW: 14.9 % — ABNORMAL HIGH (ref 11.5–14.5)
WBC: 11.8 10*3/uL — ABNORMAL HIGH (ref 3.6–11.0)

## 2016-04-26 LAB — BASIC METABOLIC PANEL
ANION GAP: 8 (ref 5–15)
BUN: 13 mg/dL (ref 6–20)
CALCIUM: 9.2 mg/dL (ref 8.9–10.3)
CO2: 20 mmol/L — ABNORMAL LOW (ref 22–32)
Chloride: 109 mmol/L (ref 101–111)
Creatinine, Ser: 0.89 mg/dL (ref 0.44–1.00)
GFR calc non Af Amer: >60 mL/min (ref >60)
Glucose, Bld: 121 mg/dL — ABNORMAL HIGH (ref 65–99)
Potassium: 3.7 mmol/L (ref 3.5–5.1)
SODIUM: 137 mmol/L (ref 135–145)

## 2016-04-26 MED ORDER — CALCIUM CITRATE-VITAMIN D 250-100 MG-UNIT PO TABS
1.0000 | ORAL_TABLET | Freq: Two times a day (BID) | ORAL | Status: DC
  Filled 2016-04-26: qty 1

## 2016-04-26 MED ORDER — DOCUSATE SODIUM 100 MG PO CAPS
100.0000 mg | ORAL_CAPSULE | Freq: Two times a day (BID) | ORAL | Status: DC
  Administered 2016-04-26 – 2016-04-28 (×6): 100 mg via ORAL
  Filled 2016-04-26 (×6): qty 1

## 2016-04-26 MED ORDER — DEXTROSE 5 % IV SOLN
1.0000 g | INTRAVENOUS | Status: DC
  Administered 2016-04-26 – 2016-04-27 (×2): 1 g via INTRAVENOUS
  Filled 2016-04-26 (×3): qty 10

## 2016-04-26 MED ORDER — MORPHINE SULFATE (PF) 2 MG/ML IV SOLN
2.0000 mg | INTRAVENOUS | Status: DC | PRN
  Administered 2016-04-26: 2 mg via INTRAVENOUS
  Administered 2016-04-27: 4 mg via INTRAVENOUS
  Filled 2016-04-26: qty 2
  Filled 2016-04-26: qty 1

## 2016-04-26 MED ORDER — PANTOPRAZOLE SODIUM 40 MG PO TBEC
40.0000 mg | DELAYED_RELEASE_TABLET | Freq: Every day | ORAL | Status: DC
  Administered 2016-04-26 – 2016-04-28 (×3): 40 mg via ORAL
  Filled 2016-04-26 (×3): qty 1

## 2016-04-26 MED ORDER — OXYCODONE-ACETAMINOPHEN 5-325 MG PO TABS
1.0000 | ORAL_TABLET | ORAL | Status: DC | PRN
  Administered 2016-04-27: 2 via ORAL
  Filled 2016-04-26: qty 2

## 2016-04-26 MED ORDER — DIPHENHYDRAMINE HCL 50 MG/ML IJ SOLN: 12.5000 mg | Freq: Four times a day (QID) | INTRAMUSCULAR | Status: DC | PRN

## 2016-04-26 MED ORDER — IBUPROFEN 400 MG PO TABS
400.0000 mg | ORAL_TABLET | ORAL | Status: DC | PRN
  Administered 2016-04-26 – 2016-04-28 (×6): 400 mg via ORAL
  Filled 2016-04-26 (×6): qty 1

## 2016-04-26 MED ORDER — BELLADONNA ALKALOIDS-OPIUM 16.2-60 MG RE SUPP: 1.0000 | Freq: Four times a day (QID) | RECTAL | Status: DC | PRN

## 2016-04-26 MED ORDER — SODIUM CHLORIDE 0.9 % IV SOLN
INTRAVENOUS | Status: DC
  Administered 2016-04-26 – 2016-04-27 (×4): via INTRAVENOUS

## 2016-04-26 MED ORDER — LAMOTRIGINE 100 MG PO TABS
100.0000 mg | ORAL_TABLET | Freq: Every day | ORAL | Status: DC
  Administered 2016-04-26 – 2016-04-28 (×3): 100 mg via ORAL
  Filled 2016-04-26 (×3): qty 1

## 2016-04-26 MED ORDER — ACETAMINOPHEN 325 MG PO TABS
650.0000 mg | ORAL_TABLET | ORAL | Status: DC | PRN
  Administered 2016-04-26 – 2016-04-27 (×4): 650 mg via ORAL
  Filled 2016-04-26 (×6): qty 2

## 2016-04-26 MED ORDER — ONDANSETRON HCL 4 MG/2ML IJ SOLN
4.0000 mg | INTRAMUSCULAR | Status: DC | PRN
  Administered 2016-04-26: 4 mg via INTRAVENOUS
  Filled 2016-04-26: qty 2

## 2016-04-26 MED ORDER — LAMOTRIGINE 25 MG PO CHEW
100.0000 mg | CHEWABLE_TABLET | Freq: Every day | ORAL | Status: DC
  Filled 2016-04-26: qty 4

## 2016-04-26 MED ORDER — HEPARIN SODIUM (PORCINE) 5000 UNIT/ML IJ SOLN
5000.0000 [IU] | Freq: Three times a day (TID) | INTRAMUSCULAR | Status: DC
  Administered 2016-04-26 – 2016-04-28 (×8): 5000 [IU] via SUBCUTANEOUS
  Filled 2016-04-26 (×8): qty 1

## 2016-04-26 MED ORDER — PROMETHAZINE HCL 25 MG/ML IJ SOLN
INTRAMUSCULAR | Status: AC
  Filled 2016-04-26: qty 1

## 2016-04-26 MED ORDER — CYCLOBENZAPRINE HCL 10 MG PO TABS: 10.0000 mg | ORAL_TABLET | Freq: Three times a day (TID) | ORAL | Status: DC | PRN

## 2016-04-26 MED ORDER — BIOTIN 10 MG PO CAPS: 10.0000 mg | ORAL_CAPSULE | Freq: Every day | ORAL | Status: DC

## 2016-04-26 MED ORDER — DIPHENHYDRAMINE HCL 12.5 MG/5ML PO ELIX: 12.5000 mg | ORAL_SOLUTION | Freq: Four times a day (QID) | ORAL | Status: DC | PRN

## 2016-04-26 MED ORDER — OXYBUTYNIN CHLORIDE 5 MG PO TABS
5.0000 mg | ORAL_TABLET | Freq: Three times a day (TID) | ORAL | Status: DC | PRN
  Administered 2016-04-26 (×2): 5 mg via ORAL
  Filled 2016-04-26 (×2): qty 1

## 2016-04-26 MED ORDER — CALCIUM CARBONATE-VITAMIN D 500-200 MG-UNIT PO TABS
0.5000 | ORAL_TABLET | Freq: Two times a day (BID) | ORAL | Status: DC
  Administered 2016-04-26 – 2016-04-28 (×5): 0.5 via ORAL
  Filled 2016-04-26 (×6): qty 1

## 2016-04-26 NOTE — Anesthesia Postprocedure Evaluation (Signed)
Anesthesia Post Note  Patient: Sierra [name]  Procedure(s) Performed: Procedure(s) (LRB): CYSTOSCOPY WITH STENT PLACEMENT (Left)  Patient location during evaluation: PACU Anesthesia Type: General Level of consciousness: awake and alert and oriented Pain management: pain level controlled Vital Signs Assessment: post-procedure vital signs reviewed and stable Respiratory status: spontaneous breathing, nonlabored ventilation and respiratory function stable Cardiovascular status: blood pressure returned to baseline and stable Postop Assessment: no signs of nausea or vomiting Anesthetic complications: no     Last Vitals:  Vitals:   04/26/16 0153 04/26/16 0414  BP: (!) 127/54 (!) 116/49  Pulse: (!) 127 (!) 131  Resp: 20 20  Temp: (!) 38 C 37.3 C    Last Pain:  Vitals:   04/26/16 0414  TempSrc: Oral  PainSc:                  Alvaretta Eisenberger

## 2016-04-26 NOTE — Progress Notes (Signed)
This RN notified MD about patient elevated Temp. 100.5 after intervention. Acknowledged by MD, new order provided. Patient resting in bed. Continue to assess

## 2016-04-26 NOTE — Progress Notes (Signed)
Urology Follow Up  Subjective: Febrile to 101.9 this a.m. Otherwise, doing well. Severe pain has resolved. Complaints of urge to urinate.  Anti-infectives: Anti-infectives    Start     Dose/Rate Route Frequency Ordered Stop   04/26/16 2100  cefTRIAXone (ROCEPHIN) 1 g in dextrose 5 % 50 mL IVPB     1 g 100 mL/hr over 30 Minutes Intravenous Every 24 hours 04/26/16 0024     04/25/16 2030  cefTRIAXone (ROCEPHIN) 1 g in dextrose 5 % 50 mL IVPB  Status:  Discontinued     1 g 100 mL/hr over 30 Minutes Intravenous  Once 04/25/16 2023 04/25/16 2025   04/25/16 2030  cefTRIAXone (ROCEPHIN) IVPB 1 g     1 g 100 mL/hr over 30 Minutes Intravenous  Once 04/25/16 2025 04/25/16 2134      Current Facility-Administered Medications  Medication Dose Route Frequency Provider Last Rate Last Dose  . 0.9 %  sodium chloride infusion   Intravenous Continuous [name], MD 125 mL/hr at 04/26/16 0830    . acetaminophen (TYLENOL) tablet 650 mg  650 mg Oral Q4H PRN [name], MD   650 mg at 04/26/16 1610  . calcium-vitamin D (OSCAL WITH D) 500-200 MG-UNIT per tablet 0.5 tablet  0.5 tablet Oral BID WC [name], MD   0.5 tablet at 04/26/16 0823  . cefTRIAXone (ROCEPHIN) 1 g in dextrose 5 % 50 mL IVPB  1 g Intravenous Q24H [name], MD      . cyclobenzaprine (FLEXERIL) tablet 10 mg  10 mg Oral TID PRN [name], MD      . diphenhydrAMINE (BENADRYL) injection 12.5 mg  12.5 mg Intravenous Q6H PRN [name], MD       Or  . diphenhydrAMINE (BENADRYL) 12.5 MG/5ML elixir 12.5 mg  12.5 mg Oral Q6H PRN [name], MD      . docusate sodium (COLACE) capsule 100 mg  100 mg Oral BID [name], MD   100 mg at 04/26/16 0115  . heparin injection 5,000 Units  5,000 Units Subcutaneous Q8H [name], MD   5,000 Units at 04/26/16 0503  . lamoTRIgine (LAMICTAL) tablet 100 mg  100 mg Oral Daily [name], MD      . morphine 2 MG/ML injection 2-4 mg  2-4 mg Intravenous Q2H PRN [name], MD      . ondansetron [facility]) injection 4 mg  4 mg Intravenous Q4H PRN [name], MD      . opium-belladonna (B&O SUPPRETTES) 16.2-60 MG suppository 1 suppository  1 suppository Rectal Q6H PRN [name], MD      . oxybutynin (DITROPAN) tablet 5 mg  5 mg Oral Q8H PRN [name], MD      . oxyCODONE-acetaminophen (PERCOCET/ROXICET) 5-325 MG per tablet 1-2 tablet  1-2 tablet Oral Q4H PRN [name], MD      . pantoprazole (PROTONIX) EC tablet 40 mg  40 mg Oral Daily [name], MD      . promethazine (PHENERGAN) 25 MG/ML injection            Facility-Administered Medications Ordered in Other Encounters  Medication Dose Route Frequency Provider Last Rate Last Dose  . technetium sestamibi (CARDIOLITE) injection 30 millicurie  30 millicurie Intravenous Once PRN [employer] MD         Objective: Vital signs in last 24 hours: Temp:  [98 F (36.7 C)-101.9 F (38.8 C)] 101.9 F (38.8 C) (04/12 0902) Pulse Rate:  [88-131] 131 (04/12  1610) Resp:  [15-22] 20 (04/12 0414) BP: (107-160)/(49-86) 116/49 (04/12 0414) SpO2:  [97 %-100 %] 100 % (04/12 0414) Weight:  [357 lb 6.4 oz (162.1 kg)-360 lb (163.3 kg)] 357 lb 6.4 oz (162.1 kg) (04/12 0031)  Intake/Output from previous day: 04/11 0701 - 04/12 0700 In: 1247.2 [I.V.:1197.2; IV Piggyback:50] Out: 650 [Urine:650] Intake/Output this shift: No intake/output data recorded.   Physical Exam  Constitutional: She is oriented to person, place, and time and well-developed, well-nourished, and in no distress.  HENT:  Head: Normocephalic and atraumatic.  Cardiovascular: Normal rate.   Abdominal: Soft. She exhibits no distension. There is no tenderness.  Obese  Neurological: She is alert and oriented to person, place, and time.  Skin: Skin is warm and dry.  Psychiatric: Affect normal.    Lab Results:   Recent Labs  04/25/16 1632 04/26/16 0527  WBC 5.8 11.8*  HGB 14.5 14.2  HCT 43.0 42.1  PLT 195 171    BMET  Recent Labs  04/25/16 1632 04/26/16 0527  NA 138 137  K 3.9 3.7  CL 108 109  CO2 22 20*  GLUCOSE 94 121*  BUN 15 13  CREATININE 0.92 0.89  CALCIUM 9.7 9.2    Studies/Results: Ct Renal Stone Study  Result Date: 04/25/2016 CLINICAL DATA:  LEFT flank pain, nausea and vomiting. EXAM: CT ABDOMEN AND PELVIS WITHOUT CONTRAST TECHNIQUE: Multidetector CT imaging of the abdomen and pelvis was performed following the standard protocol without IV contrast. COMPARISON:  CT 01/27/2012 FINDINGS: Lower chest: Lung bases are clear. Hepatobiliary: No focal hepatic lesion. Postcholecystectomy. No biliary dilatation. Pancreas: Pancreas is normal. No ductal dilatation. No pancreatic inflammation. Spleen: Normal spleen Adrenals/urinary tract: Adrenal glands are normal. Obstructing calculus in the proximal LEFT ureter at the ureteral pelvic junction. This calculus measures 11 mm in craniocaudad dimension and 8 mm in axial dimension (image 45, series 2). This calculus is evident on the CT topogram LEFT of the L3 vertebral body. No RIGHT ureteral calculi.  No bladder calculi. Mild hydronephrosis proximal obstructing LEFT ureteral calculus. Stomach/Bowel: Post gastric sleeve anatomy. Duodenum small bowel are normal. The appendix and colon are normal. Vascular/Lymphatic: Abdominal aorta is normal caliber. There is no retroperitoneal or periportal lymphadenopathy. No pelvic lymphadenopathy. Reproductive: Uterus and ovaries normal.  IUD in expected location Other: Focal umbilical hernia measuring 3.7 cm. Narrow mouth midline. Musculoskeletal: No aggressive osseous lesion. IMPRESSION: 1. Large obstructing calculus within the proximal LEFT ureter at the ureteropelvic junction. Mild hydronephrosis. 2. Calculus evident on the CT topogram. Electronically Signed   By: Genevive Bi M.D.   On: 04/25/2016 20:25   UCx pending  Assessment: POD 1 s/p left ureteral stent placement for obstructing 11 mm proximal stone,  pyelonephritis  Plan:  1) Obstructing left ureteral calculus- POD 1 s/p left ureteral stent placement.    2) UTI/ pyelonephritis- ceftriaxone, will broaden and obtain blood cultures if continues to spike. Follow-up urine culture.  3) Depression/ bipolar- continue home meds  4) GERD- continue home meds  5) Recent gastric bypass- will continue gentle IV fluids  Dispo: discharge patient once afebrile 24 hours   LOS: 0 days    [name] 04/26/2016

## 2016-04-27 ENCOUNTER — Encounter: Payer: Self-pay | Admitting: Urology

## 2016-04-27 DIAGNOSIS — N390 Urinary tract infection, site not specified: Secondary | ICD-10-CM | POA: Diagnosis not present

## 2016-04-27 DIAGNOSIS — N138 Other obstructive and reflux uropathy: Secondary | ICD-10-CM | POA: Diagnosis not present

## 2016-04-27 DIAGNOSIS — N200 Calculus of kidney: Secondary | ICD-10-CM | POA: Diagnosis not present

## 2016-04-27 DIAGNOSIS — R319 Hematuria, unspecified: Secondary | ICD-10-CM | POA: Diagnosis not present

## 2016-04-27 NOTE — Progress Notes (Signed)
Urology Follow Up  Subjective: Fever to 103.1 early this morning, blood cultures obtained. Patient reports feeling achy and tired, otherwise no complaints. Left flank pain is resolved.  Anti-infectives: Anti-infectives    Start     Dose/Rate Route Frequency Ordered Stop   04/26/16 2100  cefTRIAXone (ROCEPHIN) 1 g in dextrose 5 % 50 mL IVPB     1 g 100 mL/hr over 30 Minutes Intravenous Every 24 hours 04/26/16 0024     04/25/16 2030  cefTRIAXone (ROCEPHIN) 1 g in dextrose 5 % 50 mL IVPB  Status:  Discontinued     1 g 100 mL/hr over 30 Minutes Intravenous  Once 04/25/16 2023 04/25/16 2025   04/25/16 2030  cefTRIAXone (ROCEPHIN) IVPB 1 g     1 g 100 mL/hr over 30 Minutes Intravenous  Once 04/25/16 2025 04/25/16 2134      Current Facility-Administered Medications  Medication Dose Route Frequency Provider Last Rate Last Dose  . 0.9 %  sodium chloride infusion   Intravenous Continuous [name], MD 50 mL/hr at 04/27/16 0035    . acetaminophen (TYLENOL) tablet 650 mg  650 mg Oral Q4H PRN [name], MD   650 mg at 04/27/16 0429  . calcium-vitamin D (OSCAL WITH D) 500-200 MG-UNIT per tablet 0.5 tablet  0.5 tablet Oral BID WC [name], MD   0.5 tablet at 04/26/16 1642  . cefTRIAXone (ROCEPHIN) 1 g in dextrose 5 % 50 mL IVPB  1 g Intravenous Q24H [name], MD   1 g at 04/26/16 2026  . cyclobenzaprine (FLEXERIL) tablet 10 mg  10 mg Oral TID PRN [name], MD      . diphenhydrAMINE (BENADRYL) injection 12.5 mg  12.5 mg Intravenous Q6H PRN [name], MD       Or  . diphenhydrAMINE (BENADRYL) 12.5 MG/5ML elixir 12.5 mg  12.5 mg Oral Q6H PRN [name], MD      . docusate sodium (COLACE) capsule 100 mg  100 mg Oral BID [name], MD   100 mg at 04/26/16 2117  . heparin injection 5,000 Units  5,000 Units Subcutaneous Q8H [name], MD   5,000 Units at 04/27/16 0520  . ibuprofen (ADVIL,MOTRIN) tablet 400 mg  400 mg Oral Q4H PRN [name], MD   400 mg at  04/27/16 0550  . lamoTRIgine (LAMICTAL) tablet 100 mg  100 mg Oral Daily [name], MD   100 mg at 04/26/16 1056  . morphine 2 MG/ML injection 2-4 mg  2-4 mg Intravenous Q2H PRN [name], MD   4 mg at 04/27/16 0519  . ondansetron (ZOFRAN) injection 4 mg  4 mg Intravenous Q4H PRN [name], MD   4 mg at 04/26/16 1151  . opium-belladonna (B&O SUPPRETTES) 16.2-60 MG suppository 1 suppository  1 suppository Rectal Q6H PRN [name], MD      . oxybutynin (DITROPAN) tablet 5 mg  5 mg Oral Q8H PRN [name], MD   5 mg at 04/26/16 1859  . oxyCODONE-acetaminophen (PERCOCET/ROXICET) 5-325 MG per tablet 1-2 tablet  1-2 tablet Oral Q4H PRN [name], MD      . pantoprazole (PROTONIX) EC tablet 40 mg  40 mg Oral Daily [name], MD   40 mg at 04/26/16 1057   Facility-Administered Medications Ordered in Other Encounters  Medication Dose Route Frequency Provider Last Rate Last Dose  . technetium sestamibi (CARDIOLITE) injection 30 millicurie  30 millicurie Intravenous Once PRN [employer] MD  Objective: Vital signs in last 24 hours: Temp:  [98.6 F (37 C)-103.1 F (39.5 C)] 98.6 F (37 C) (04/13 0719) Pulse Rate:  [98-119] 113 (04/13 0416) Resp:  [20] 20 (04/13 0416) BP: (131-142)/(69-73) 131/73 (04/13 0416) SpO2:  [91 %-100 %] 97 % (04/13 0416)  Intake/Output from previous day: 04/12 0701 - 04/13 0700 In: 867 [P.O.:120; I.V.:747] Out: 1200 [Urine:1200] Intake/Output this shift: No intake/output data recorded.   Physical Exam  Constitutional: She is oriented to person, place, and time and well-developed, well-nourished, and in no distress.  HENT:  Head: Normocephalic and atraumatic.  Cardiovascular: Normal rate.   Abdominal: Soft. She exhibits no distension. There is no tenderness.  Obese  Neurological: She is alert and oriented to person, place, and time.  Skin: Skin is warm and dry.  Psychiatric: Affect normal.    Lab Results:    Recent Labs  04/25/16 1632 04/26/16 0527  WBC 5.8 11.8*  HGB 14.5 14.2  HCT 43.0 42.1  PLT 195 171   BMET  Recent Labs  04/25/16 1632 04/26/16 0527  NA 138 137  K 3.9 3.7  CL 108 109  CO2 22 20*  GLUCOSE 94 121*  BUN 15 13  CREATININE 0.92 0.89  CALCIUM 9.7 9.2    Studies/Results: Ct Renal Stone Study  Result Date: 04/25/2016 CLINICAL DATA:  LEFT flank pain, nausea and vomiting. EXAM: CT ABDOMEN AND PELVIS WITHOUT CONTRAST TECHNIQUE: Multidetector CT imaging of the abdomen and pelvis was performed following the standard protocol without IV contrast. COMPARISON:  CT 01/27/2012 FINDINGS: Lower chest: Lung bases are clear. Hepatobiliary: No focal hepatic lesion. Postcholecystectomy. No biliary dilatation. Pancreas: Pancreas is normal. No ductal dilatation. No pancreatic inflammation. Spleen: Normal spleen Adrenals/urinary tract: Adrenal glands are normal. Obstructing calculus in the proximal LEFT ureter at the ureteral pelvic junction. This calculus measures 11 mm in craniocaudad dimension and 8 mm in axial dimension (image 45, series 2). This calculus is evident on the CT topogram LEFT of the L3 vertebral body. No RIGHT ureteral calculi.  No bladder calculi. Mild hydronephrosis proximal obstructing LEFT ureteral calculus. Stomach/Bowel: Post gastric sleeve anatomy. Duodenum small bowel are normal. The appendix and colon are normal. Vascular/Lymphatic: Abdominal aorta is normal caliber. There is no retroperitoneal or periportal lymphadenopathy. No pelvic lymphadenopathy. Reproductive: Uterus and ovaries normal.  IUD in expected location Other: Focal umbilical hernia measuring 3.7 cm. Narrow mouth midline. Musculoskeletal: No aggressive osseous lesion. IMPRESSION: 1. Large obstructing calculus within the proximal LEFT ureter at the ureteropelvic junction. Mild hydronephrosis. 2. Calculus evident on the CT topogram. Electronically Signed   By: Genevive Bi M.D.   On: 04/25/2016 20:25    Urine culture from 04/25/2016 growing greater than 100,000 colony gram-negative rods, awaiting speciation and sensitivities  Assessment: POD 2 s/p left ureteral stent placement for obstructing 11 mm proximal stone, pyelonephritis.  Continues to spike fevers to 103.  Plan:  1) Obstructing left ureteral calculus- POD 2 s/p left ureteral stent placement.    2) UTI/ pyelonephritis- ceftriaxone, follow-up urine culture, adjust antibiotics based on culture and sensitivity data. Will broaden antibiotics if he decompensates.  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

## 2016-04-28 LAB — URINE CULTURE

## 2016-04-28 MED ORDER — SULFAMETHOXAZOLE-TRIMETHOPRIM 800-160 MG PO TABS: 1.0000 | ORAL_TABLET | Freq: Two times a day (BID) | ORAL | 0 refills | Status: DC

## 2016-04-28 MED ORDER — OXYBUTYNIN CHLORIDE 5 MG PO TABS: 5.0000 mg | ORAL_TABLET | Freq: Three times a day (TID) | ORAL | 0 refills | Status: DC | PRN

## 2016-04-28 MED ORDER — OXYCODONE-ACETAMINOPHEN 5-325 MG PO TABS: 1.0000 | ORAL_TABLET | ORAL | 0 refills | Status: DC | PRN

## 2016-04-28 MED ORDER — TAMSULOSIN HCL 0.4 MG PO CAPS: 0.4000 mg | ORAL_CAPSULE | Freq: Every day | ORAL | 0 refills | Status: DC

## 2016-04-28 MED ORDER — DOCUSATE SODIUM 100 MG PO CAPS: 100.0000 mg | ORAL_CAPSULE | Freq: Two times a day (BID) | ORAL | 0 refills | Status: DC

## 2016-04-28 NOTE — Progress Notes (Signed)
Patient is discharged home with husband in good condition, discharge instructions reviewed with patient including medications and accessing my chart.  IV removed.

## 2016-05-01 ENCOUNTER — Telehealth: Payer: Self-pay | Admitting: Radiology

## 2016-05-01 ENCOUNTER — Other Ambulatory Visit: Payer: Self-pay | Admitting: Radiology

## 2016-05-01 DIAGNOSIS — N201 Calculus of ureter: Secondary | ICD-10-CM

## 2016-05-01 NOTE — Telephone Encounter (Signed)
-----   Message from [name], MD sent at 04/30/2016  9:48 AM EDT ----- Regarding: follow up This patient got discharged over the weekend and did not get a follow-up appointment.  Her come see me on Thursday at 11:30?  I am going to council her to have ureteroscopy, laser lithotripsy for her left ureteral stone. Kidney please pick out a tentative date for this?  [name], MD

## 2016-05-01 NOTE — Telephone Encounter (Signed)
Notified pt of surgery scheduled with Dr Brandon on 05/14/16, pre-admit testing appt & to call Friday prior to surgery for arrival time to SDS. Questions answered. Pt voices understanding. 

## 2016-05-01 NOTE — Telephone Encounter (Signed)
Office visit appt made. Pt aware. LMOM. Need to discuss surgery information with pt.

## 2016-05-02 LAB — CULTURE, BLOOD (ROUTINE X 2)
CULTURE: NO GROWTH
CULTURE: NO GROWTH
SPECIAL REQUESTS: ADEQUATE
Special Requests: ADEQUATE

## 2016-05-08 ENCOUNTER — Ambulatory Visit (INDEPENDENT_AMBULATORY_CARE_PROVIDER_SITE_OTHER)

## 2016-05-08 ENCOUNTER — Other Ambulatory Visit: Payer: Self-pay

## 2016-05-08 ENCOUNTER — Ambulatory Visit: Payer: Self-pay | Admitting: Urology

## 2016-05-08 VITALS — BP 141/82 | HR 96 | Ht 64.0 in | Wt 384.0 lb

## 2016-05-08 DIAGNOSIS — N200 Calculus of kidney: Secondary | ICD-10-CM

## 2016-05-08 LAB — URINALYSIS, COMPLETE
BILIRUBIN UA: NEGATIVE
GLUCOSE, UA: NEGATIVE
Nitrite, UA: NEGATIVE
Urobilinogen, Ur: 1 mg/dL (ref 0.2–1.0)
pH, UA: 6 (ref 5.0–7.5)

## 2016-05-08 LAB — MICROSCOPIC EXAMINATION: RBC, UA: >30 /hpf — ABNORMAL HIGH (ref 0–?)

## 2016-05-08 NOTE — Progress Notes (Signed)
Pt presents today with c/o urinary frequency and urgency, and blood in urine. Pt has been on abx and has had urological surgery in the last 30 days. Pt gave a clean catch urine for u/a and cx.  Blood pressure (!) 141/82, pulse 96, height  (1.626 m), weight (!) 384 lb (174.2 kg).

## 2016-05-09 ENCOUNTER — Encounter
Admission: RE | Admit: 2016-05-09 | Discharge: 2016-05-09 | Disposition: A | Discharge status: Home / Self Care | Source: Ambulatory Visit | Attending: Urology | Admitting: Urology

## 2016-05-09 DIAGNOSIS — N201 Calculus of ureter: Secondary | ICD-10-CM | POA: Diagnosis not present

## 2016-05-09 DIAGNOSIS — Z01818 Encounter for other preprocedural examination: Secondary | ICD-10-CM | POA: Diagnosis present

## 2016-05-09 HISTORY — DX: Headache, unspecified: R51.9

## 2016-05-09 HISTORY — DX: Headache: R51

## 2016-05-09 HISTORY — DX: Fibromyalgia: M79.7

## 2016-05-09 NOTE — Pre-Procedure Instructions (Signed)
Dr. Priscella Mann made aware of patient BMI (58.19)

## 2016-05-09 NOTE — Progress Notes (Signed)
Pharmacy consulted to dose Gentamicin for Surgical Prophylaxis on 36 yo female for Ureteroscopy to be done on May 14, 2016.  Patient has CrCl approx. 133 ml/min with weight of 153.8 kg.  Using Adjusted Body Wt of 94.3kg will order Gentamicin 5 mg/kg Pre-op for dose of .  Orders Signed and Held.  Pierce Crane. Madilyn Fireman, [place] 05/09/2016

## 2016-05-09 NOTE — Patient Instructions (Signed)
  Your procedure is scheduled on: May 14, 2016 (Monday) Report to Same Day Surgery 2nd floor medical mall [facility] Entrance-take elevator on left to 2nd floor.  Check in with surgery information desk.) To find out your arrival time please call [phone] between 1PM - 3PM on May 11, 2016 (Friday)  Remember: Instructions that are not followed completely may result in serious medical risk, up to and including death, or upon the discretion of your surgeon and anesthesiologist your surgery may need to be rescheduled.    _x___ 1. Do not eat food or drink liquids after midnight. No gum chewing or hard candies.                               __x__ 2. No Alcohol for 24 hours before or after surgery.   __x__3. No Smoking for 24 prior to surgery.   ____  4. Bring all medications with you on the day of surgery if instructed.    __x__ 5. Notify your doctor if there is any change in your medical condition     (cold, fever, infections).     Do not wear jewelry, make-up, hairpins, clips or nail polish.  Do not wear lotions, powders, or perfumes. You may wear deodorant.  Do not shave 48 hours prior to surgery. Men may shave face and neck.  Do not bring valuables to the hospital.    [facility] is not responsible for any belongings or valuables.               Contacts, dentures or bridgework may not be worn into surgery.  Leave your suitcase in the car. After surgery it may be brought to your room.  For patients admitted to the hospital, discharge time is determined by your  treatment team                       Patients discharged the day of surgery will not be allowed to drive home.  You will need someone to drive you home and stay with you the night of your procedure.    Please read over the following fact sheets that you were given:   [facility] Preparing for Surgery and or MRSA Information   _x___ Take anti-hypertensive (unless it includes a diuretic), cardiac, seizure, asthma,      anti-reflux and psychiatric medicines. These include:  1. OMEPRAZOLE  2.  3.  4.  5.  6.  ____Fleets enema or Magnesium Citrate as directed.   ___ Use CHG Soap or sage wipes as directed on instruction sheet   ____ Use inhalers on the day of surgery and bring to hospital day of surgery  ____ Stop Metformin and Janumet 2 days prior to surgery.    ____ Take 1/2 of usual insulin dose the night before surgery and none on the morning  surgery   .   _x___ Follow recommendations from Cardiologist, Pulmonologist or PCP regarding          stopping Aspirin, Coumadin, Pllavix ,Eliquis, Effient, or Pradaxa, and Pletal.  X____Stop Anti-inflammatories such as Advil, Aleve, Ibuprofen, Motrin, Naproxen, Naprosyn, Goodies powders or aspirin products. OK to take Tylenol    _x___ Stop supplements until after surgery.  But may continue Vitamin D, Vitamin B, and multivitamin      ____ Bring C-Pap to the hospital.

## 2016-05-10 ENCOUNTER — Encounter: Payer: Self-pay | Admitting: Urology

## 2016-05-10 ENCOUNTER — Ambulatory Visit (INDEPENDENT_AMBULATORY_CARE_PROVIDER_SITE_OTHER): Admitting: Urology

## 2016-05-10 VITALS — BP 119/82 | HR 106 | Ht 64.0 in | Wt 339.0 lb

## 2016-05-10 DIAGNOSIS — N201 Calculus of ureter: Secondary | ICD-10-CM | POA: Diagnosis not present

## 2016-05-10 DIAGNOSIS — Z87448 Personal history of other diseases of urinary system: Secondary | ICD-10-CM

## 2016-05-10 DIAGNOSIS — Z87442 Personal history of urinary calculi: Secondary | ICD-10-CM

## 2016-05-10 LAB — CULTURE, URINE COMPREHENSIVE

## 2016-05-10 NOTE — Progress Notes (Signed)
 05/10/2016 3:16 PM   Sierra Sierra [name] 10/25/1980 [id number]  Referring provider: Cheryl P Lindley, FNP [address] [place], [place] [address]  Chief Complaint  Sierra [name] presents with  . Nephrolithiasis    New Sierra [name]    HPI: 35-year-old female with an 11 mm left proximal obstructing ureteral calculus who underwent urgent left ureteral stent placement on 04/25/2016 in the setting of UTI/pyelonephritis. She remained in the hospital for several days postoperatively with fevers to 103. She ultimately grew Klebsiella pneumonia in her urine assistant to ampicillin and nitrofurantoin. She was ultimately discharged home in stable condition. She returns to the office today to discuss definitive management of her stone.  She does have a personal history of kidney stones and has passed multiple stones spontaneously over the past several years. Her first stone was at age 16 requiring shockwave lithotripsy. She also has a strong family history of stones.  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  Overall, she was feeling better until yesterday when she started having worsening malaise, lower abdominal pain, urgency and frequency. No fevers.  She has a few additional days of antibiotics.  She did go to preadmission testing tomorrow for upcoming planned procedure and urine cultures pending.  PMH: Past Medical History:  Diagnosis Date  . Abdominal pain, other specified site   . Anxiety state, unspecified   . Bipolar disorder, unspecified (HCC)   . Depressive disorder, not elsewhere classified   . Edema   . Esophageal reflux   . Fibromyalgia   . Headache   . History of kidney stones   . Mild or unspecified pre-eclampsia, unspecified as to episode of care   . Morbid obesity (HCC)   . Myalgia and myositis, unspecified   . Other dyspnea and respiratory abnormality   . Preeclampsia    with first pregnancy  . Urinary tract infection, site not  specified     Surgical History: Past Surgical History:  Procedure Laterality Date  . CESAREAN SECTION     X 2  . CHOLECYSTECTOMY  03/2006  . CYSTOSCOPY WITH STENT PLACEMENT Left 04/25/2016   Procedure: CYSTOSCOPY WITH STENT PLACEMENT;  Surgeon: Devonna Oboyle, MD;  Location: [facility] ORS;  Service: Urology;  Laterality: Left;  . DILATION AND CURETTAGE OF UTERUS    . IVC FILTER INSERTION  03/26/2016   [facility]  . LAPAROSCOPIC GASTRIC RESTRICTIVE DUODENAL PROCEDURE (DUODENAL SWITCH)  03/2016  . LITHOTRIPSY    . TONSILLECTOMY AND ADENOIDECTOMY      Home Medications:  Allergies as of 05/10/2016   No Known Allergies     Medication List       Accurate as of 05/10/16  3:16 PM. Always use your most recent med list.          Biotin 10 MG Caps Take 5,000 mg by mouth daily.   lamoTRIgine 150 MG tablet Commonly known as:  LAMICTAL Take 150 mg by mouth at bedtime.   lamoTRIgine 25 MG Chew chewable tablet Commonly known as:  LAMICTAL Chew 4 tablets (100 mg total) by mouth daily.   multivitamin tablet Take 1 tablet by mouth daily.   omeprazole 20 MG capsule Commonly known as:  PRILOSEC Take 20 mg by mouth 2 (two) times daily.   oxybutynin 5 MG tablet Commonly known as:  DITROPAN Take 1 tablet (5 mg total) by mouth every 8 (eight) hours as needed for bladder spasms.   oxyCODONE-acetaminophen 5-325 MG tablet Commonly known as:  PERCOCET/ROXICET Take 1-2 tablets   by mouth every 4 (four) hours as needed for moderate pain.   sulfamethoxazole-trimethoprim 800-160 MG tablet Commonly known as:  BACTRIM DS,SEPTRA DS Take 1 tablet by mouth 2 (two) times daily.   tamsulosin 0.4 MG Caps capsule Commonly known as:  FLOMAX Take 1 capsule (0.4 mg total) by mouth daily.       Allergies: No Known Allergies  Family History: Family History  Problem Relation Age of Onset  . Depression Father   . Alcohol abuse Father   . Depression Mother   . Hyperlipidemia Mother   . Sleep apnea  Mother   . Depression Sister   . Hyperlipidemia Brother   . Depression Brother   . Hyperlipidemia Brother   . Depression Brother   . Alcohol abuse Brother   . Coronary artery disease Maternal Grandmother   . Heart attack Maternal Grandmother   . Diabetes Maternal Grandmother   . Lung cancer Maternal Grandfather   . Emphysema Maternal Grandfather   . Coronary artery disease Maternal Grandfather   . Lupus      Aunt  . Fibromyalgia      Aunt    Social History:  reports that she has never smoked. She has never used smokeless tobacco. She reports that she drinks alcohol. She reports that she does not use drugs.  ROS: UROLOGY Frequent Urination?: Yes Hard to postpone urination?: Yes Burning/pain with urination?: No Get up at night to urinate?: Yes Leakage of urine?: Yes Urine stream starts and stops?: Yes Trouble starting stream?: Yes Do you have to strain to urinate?: Yes Blood in urine?: Yes Urinary tract infection?: Yes Sexually transmitted disease?: No Injury to kidneys or bladder?: No Painful intercourse?: No Weak stream?: No Currently pregnant?: No Vaginal bleeding?: No Last menstrual period?: 04/28/16  Gastrointestinal Nausea?: Yes Vomiting?: Yes Indigestion/heartburn?: No Diarrhea?: No Constipation?: No  Constitutional Fever: No Night sweats?: No Weight loss?: Yes Fatigue?: Yes  Skin Skin rash/lesions?: No Itching?: Yes  Eyes Blurred vision?: No Double vision?: No  Ears/Nose/Throat Sore throat?: No Sinus problems?: No  Hematologic/Lymphatic Swollen glands?: No Easy bruising?: Yes  Cardiovascular Leg swelling?: No Chest pain?: No  Respiratory Cough?: No Shortness of breath?: No  Endocrine Excessive thirst?: Yes  Musculoskeletal Back pain?: Yes Joint pain?: No  Neurological Headaches?: Yes Dizziness?: Yes  Psychologic Depression?: No Anxiety?: No  Physical Exam: BP 119/82   Pulse (!) 106   Ht 5' 4" (1.626 m)   Wt (!) 339  lb (153.8 kg)   LMP 04/28/2016 (Exact Date)   BMI 58.19 kg/m   Constitutional:  Alert and oriented, No acute distress. HEENT: [place] AT, moist mucus membranes.  Trachea midline, no masses. Cardiovascular: No clubbing, cyanosis, or edema. Respiratory: Normal respiratory effort, no increased work of breathing. GI: Abdomen is soft, nontender, nondistended, no abdominal masses.  Morbidly obese. GU: No CVA tenderness. Skin: No rashes, bruises or suspicious lesions. Neurologic: Grossly intact, no focal deficits, moving all 4 extremities. Psychiatric: Normal mood and affect.  Laboratory Data: Lab Results  Component Value Date   WBC 11.8 (H) 04/26/2016   HGB 14.2 04/26/2016   HCT 42.1 04/26/2016   MCV 89.2 04/26/2016   PLT 171 04/26/2016    Lab Results  Component Value Date   CREATININE 0.89 04/26/2016    Lab Results  Component Value Date   HGBA1C 5.0 03/23/2014    Urinalysis Component     Latest Ref Rng & Units 05/08/2016        11:39 AM  Specific   Gravity, UA     1.005 - 1.030 >1.030 (H)  pH, UA     5.0 - 7.5 6.0  Color, UA     Yellow Red (A)  Appearance Ur     Clear Turbid (A)  Leukocytes, UA     Negative 1+ (A)  Protein, UA     Negative/Trace 3+ (A)  Glucose     Negative Negative  Ketones, UA     Negative 4+ (A)  RBC, UA     Negative 3+ (A)  Bilirubin, UA     Negative Negative  Urobilinogen, Ur     0.2 - 1.0 mg/dL 1.0  Nitrite, UA     Negative Negative  Microscopic Examination      See below:    Pertinent Imaging: CLINICAL DATA:  LEFT flank pain, nausea and vomiting.  EXAM: CT ABDOMEN AND PELVIS WITHOUT CONTRAST  TECHNIQUE: Multidetector CT imaging of the abdomen and pelvis was performed following the standard protocol without IV contrast.  COMPARISON:  CT 01/27/2012  FINDINGS: Lower chest: Lung bases are clear.  Hepatobiliary: No focal hepatic lesion. Postcholecystectomy. No biliary dilatation.  Pancreas: Pancreas is normal. No ductal  dilatation. No pancreatic inflammation.  Spleen: Normal spleen  Adrenals/urinary tract: Adrenal glands are normal. Obstructing calculus in the proximal LEFT ureter at the ureteral pelvic junction. This calculus measures 11 mm in craniocaudad dimension and 8 mm in axial dimension (image 45, series 2). This calculus is evident on the CT topogram LEFT of the L3 vertebral body.  No RIGHT ureteral calculi.  No bladder calculi.  Mild hydronephrosis proximal obstructing LEFT ureteral calculus.  Stomach/Bowel: Post gastric sleeve anatomy. Duodenum small bowel are normal. The appendix and colon are normal.  Vascular/Lymphatic: Abdominal aorta is normal caliber. There is no retroperitoneal or periportal lymphadenopathy. No pelvic lymphadenopathy.  Reproductive: Uterus and ovaries normal.  IUD in expected location  Other: Focal umbilical hernia measuring 3.7 cm. Narrow mouth midline.  Musculoskeletal: No aggressive osseous lesion.  IMPRESSION: 1. Large obstructing calculus within the proximal LEFT ureter at the ureteropelvic junction. Mild hydronephrosis. 2. Calculus evident on the CT topogram.   Electronically Signed   By: Stewart  Edmunds M.D.   On: 04/25/2016 20:25  CT scan was personally reviewed today with the Sierra [name]  Assessment & Plan:    1. Left ureteral calculus We discussed various treatment options including ESWL vs. ureteroscopy, laser lithotripsy, and stent. We discussed the risks and benefits of both including bleeding, infection, damage to surrounding structures, efficacy with need for possible further intervention, and need for temporary ureteral stent.  Given her habitus, I feel that she would have a high stone free rate with ureteroscopy. She is agreeable with this plan.   2. History of kidney stones Will send stone of analysis At risk for future stones given recent gastric bypass  3. History of pyelonephritis Complete course of abx Will double  cover with abx at the time of surgery If urine cultures clonal, will continue antibiotics through surgery  Schedule left URS, LL, stent exchange  Britten Parady, MD  [facility] [address], [address] [place], [place] [address] [phone]  

## 2016-05-11 ENCOUNTER — Telehealth: Payer: Self-pay

## 2016-05-11 NOTE — Telephone Encounter (Signed)
Spoke with pt in reference to ucx and surgery. Pt voiced understanding.

## 2016-05-11 NOTE — Telephone Encounter (Signed)
-----   Message from [name], MD sent at 05/11/2016  7:53 AM EDT ----- Please let Sierra [name] know that her urine looked fine for surgery.  Complete current course and we will precede with surgery Monday as planned.

## 2016-05-13 MED ORDER — DEXTROSE 5 % IV SOLN
3.0000 g | INTRAVENOUS | Status: AC
  Administered 2016-05-14: 3 g via INTRAVENOUS
  Filled 2016-05-13: qty 3000

## 2016-05-14 ENCOUNTER — Ambulatory Visit
Admission: RE | Admit: 2016-05-14 | Discharge: 2016-05-14 | Disposition: A | Discharge status: Home / Self Care | Source: Ambulatory Visit | Attending: Urology | Admitting: Urology

## 2016-05-14 ENCOUNTER — Ambulatory Visit: Admitting: Anesthesiology

## 2016-05-14 ENCOUNTER — Encounter: Payer: Self-pay | Admitting: *Deleted

## 2016-05-14 ENCOUNTER — Encounter
Admission: RE | Disposition: A | Discharge status: Home / Self Care | Payer: Self-pay | Source: Ambulatory Visit | Attending: Urology

## 2016-05-14 DIAGNOSIS — N136 Pyonephrosis: Secondary | ICD-10-CM | POA: Insufficient documentation

## 2016-05-14 DIAGNOSIS — F319 Bipolar disorder, unspecified: Secondary | ICD-10-CM | POA: Insufficient documentation

## 2016-05-14 DIAGNOSIS — I10 Essential (primary) hypertension: Secondary | ICD-10-CM | POA: Insufficient documentation

## 2016-05-14 DIAGNOSIS — Z87442 Personal history of urinary calculi: Secondary | ICD-10-CM | POA: Insufficient documentation

## 2016-05-14 DIAGNOSIS — M797 Fibromyalgia: Secondary | ICD-10-CM | POA: Insufficient documentation

## 2016-05-14 DIAGNOSIS — F329 Major depressive disorder, single episode, unspecified: Secondary | ICD-10-CM | POA: Insufficient documentation

## 2016-05-14 DIAGNOSIS — K219 Gastro-esophageal reflux disease without esophagitis: Secondary | ICD-10-CM | POA: Insufficient documentation

## 2016-05-14 DIAGNOSIS — N201 Calculus of ureter: Secondary | ICD-10-CM

## 2016-05-14 DIAGNOSIS — Z6841 Body Mass Index (BMI) 40.0 and over, adult: Secondary | ICD-10-CM | POA: Diagnosis not present

## 2016-05-14 DIAGNOSIS — Z9884 Bariatric surgery status: Secondary | ICD-10-CM | POA: Insufficient documentation

## 2016-05-14 HISTORY — PX: URETEROSCOPY WITH HOLMIUM LASER LITHOTRIPSY: SHX6645

## 2016-05-14 HISTORY — PX: CYSTOSCOPY W/ URETERAL STENT PLACEMENT: SHX1429

## 2016-05-14 HISTORY — DX: Raynaud's syndrome without gangrene: I73.00

## 2016-05-14 LAB — POCT PREGNANCY, URINE: PREG TEST UR: NEGATIVE

## 2016-05-14 SURGERY — URETEROSCOPY, WITH LITHOTRIPSY USING HOLMIUM LASER
Anesthesia: General | Site: Ureter | Laterality: Left | Wound class: Clean Contaminated

## 2016-05-14 MED ORDER — FENTANYL CITRATE (PF) 100 MCG/2ML IJ SOLN
INTRAMUSCULAR | Status: DC | PRN
  Administered 2016-05-14 (×2): 100 ug via INTRAVENOUS

## 2016-05-14 MED ORDER — SUGAMMADEX SODIUM 500 MG/5ML IV SOLN
INTRAVENOUS | Status: DC | PRN
  Administered 2016-05-14: 307.6 mg via INTRAVENOUS

## 2016-05-14 MED ORDER — GENTAMICIN SULFATE 40 MG/ML IJ SOLN
470.0000 mg | Freq: Once | INTRAMUSCULAR | Status: DC
  Filled 2016-05-14: qty 11.75

## 2016-05-14 MED ORDER — MIDAZOLAM HCL 2 MG/2ML IJ SOLN
INTRAMUSCULAR | Status: DC | PRN
  Administered 2016-05-14: 2 mg via INTRAVENOUS

## 2016-05-14 MED ORDER — SEVOFLURANE IN SOLN
RESPIRATORY_TRACT | Status: AC
  Filled 2016-05-14: qty 250

## 2016-05-14 MED ORDER — PROPOFOL 10 MG/ML IV BOLUS
INTRAVENOUS | Status: AC
  Filled 2016-05-14: qty 20

## 2016-05-14 MED ORDER — ONDANSETRON HCL 4 MG/2ML IJ SOLN
INTRAMUSCULAR | Status: DC | PRN
  Administered 2016-05-14: 4 mg via INTRAVENOUS

## 2016-05-14 MED ORDER — MIDAZOLAM HCL 2 MG/2ML IJ SOLN
INTRAMUSCULAR | Status: AC
  Filled 2016-05-14: qty 2

## 2016-05-14 MED ORDER — ONDANSETRON HCL 4 MG/2ML IJ SOLN
INTRAMUSCULAR | Status: AC
  Filled 2016-05-14: qty 2

## 2016-05-14 MED ORDER — FENTANYL CITRATE (PF) 100 MCG/2ML IJ SOLN
25.0000 ug | INTRAMUSCULAR | Status: DC | PRN
  Administered 2016-05-14: 50 ug via INTRAVENOUS

## 2016-05-14 MED ORDER — FENTANYL CITRATE (PF) 100 MCG/2ML IJ SOLN
INTRAMUSCULAR | Status: AC
  Filled 2016-05-14: qty 2

## 2016-05-14 MED ORDER — SUCCINYLCHOLINE CHLORIDE 20 MG/ML IJ SOLN
INTRAMUSCULAR | Status: AC
  Filled 2016-05-14: qty 1

## 2016-05-14 MED ORDER — DEXAMETHASONE SODIUM PHOSPHATE 10 MG/ML IJ SOLN
INTRAMUSCULAR | Status: DC | PRN
  Administered 2016-05-14: 10 mg via INTRAVENOUS

## 2016-05-14 MED ORDER — DEXAMETHASONE SODIUM PHOSPHATE 10 MG/ML IJ SOLN
INTRAMUSCULAR | Status: AC
  Filled 2016-05-14: qty 1

## 2016-05-14 MED ORDER — IOTHALAMATE MEGLUMINE 43 % IV SOLN
INTRAVENOUS | Status: DC | PRN
  Administered 2016-05-14: 10 mL

## 2016-05-14 MED ORDER — LACTATED RINGERS IV SOLN
INTRAVENOUS | Status: DC
  Administered 2016-05-14: 07:00:00 via INTRAVENOUS

## 2016-05-14 MED ORDER — ROCURONIUM BROMIDE 50 MG/5ML IV SOLN
INTRAVENOUS | Status: AC
  Filled 2016-05-14: qty 1

## 2016-05-14 MED ORDER — SUCCINYLCHOLINE CHLORIDE 20 MG/ML IJ SOLN
INTRAMUSCULAR | Status: DC | PRN
Start: 2016-05-14 — End: 2016-05-14
  Administered 2016-05-14: 140 mg via INTRAVENOUS

## 2016-05-14 MED ORDER — OXYCODONE-ACETAMINOPHEN 5-325 MG PO TABS: 1.0000 | ORAL_TABLET | ORAL | 0 refills | Status: DC | PRN

## 2016-05-14 MED ORDER — LIDOCAINE HCL (CARDIAC) 20 MG/ML IV SOLN
INTRAVENOUS | Status: DC | PRN
  Administered 2016-05-14: 50 mg via INTRAVENOUS

## 2016-05-14 MED ORDER — PROPOFOL 10 MG/ML IV BOLUS
INTRAVENOUS | Status: DC | PRN
  Administered 2016-05-14: 150 mg via INTRAVENOUS

## 2016-05-14 MED ORDER — PROMETHAZINE HCL 25 MG/ML IJ SOLN
6.2500 mg | INTRAMUSCULAR | Status: DC | PRN
  Administered 2016-05-14: 6.25 mg via INTRAVENOUS

## 2016-05-14 MED ORDER — LIDOCAINE HCL (PF) 2 % IJ SOLN
INTRAMUSCULAR | Status: AC
  Filled 2016-05-14: qty 2

## 2016-05-14 MED ORDER — SODIUM CHLORIDE 0.9 % IJ SOLN
INTRAMUSCULAR | Status: AC
  Filled 2016-05-14: qty 20

## 2016-05-14 MED ORDER — ROCURONIUM BROMIDE 100 MG/10ML IV SOLN
INTRAVENOUS | Status: DC | PRN
  Administered 2016-05-14: 25 mg via INTRAVENOUS

## 2016-05-14 MED ORDER — PROMETHAZINE HCL 25 MG/ML IJ SOLN
INTRAMUSCULAR | Status: AC
  Filled 2016-05-14: qty 1

## 2016-05-14 MED ORDER — SUGAMMADEX SODIUM 500 MG/5ML IV SOLN
INTRAVENOUS | Status: AC
  Filled 2016-05-14: qty 5

## 2016-05-14 SURGICAL SUPPLY — 36 items
ADH LQ OCL WTPRF AMP STRL LF (MISCELLANEOUS) ×1
ADHESIVE MASTISOL STRL (MISCELLANEOUS) ×2 IMPLANT
BAG DRAIN CYSTO-URO LG1000N (MISCELLANEOUS) ×2 IMPLANT
BASKET ZERO TIP 1.9FR (BASKET) ×2 IMPLANT
BSKT STON RTRVL ZERO TP 1.9FR (BASKET) ×1
CATH URETL 5X70 OPEN END (CATHETERS) ×2 IMPLANT
CNTNR SPEC 2.5X3XGRAD LEK (MISCELLANEOUS) ×1
CONRAY 43 FOR UROLOGY 50M (MISCELLANEOUS) ×2 IMPLANT
CONT SPEC 4OZ STER OR WHT (MISCELLANEOUS) ×1
CONT SPEC 4OZ STRL OR WHT (MISCELLANEOUS) ×1
CONTAINER SPEC 2.5X3XGRAD LEK (MISCELLANEOUS) ×1 IMPLANT
DRAPE UTILITY 15X26 TOWEL STRL (DRAPES) ×2 IMPLANT
DRSG TEGADERM 2-3/8X2-3/4 SM (GAUZE/BANDAGES/DRESSINGS) ×2 IMPLANT
FIBER LASER LITHO 273 (Laser) ×2 IMPLANT
GLOVE BIO SURGEON STRL SZ 6.5 (GLOVE) ×2 IMPLANT
GOWN STRL REUS W/ TWL LRG LVL3 (GOWN DISPOSABLE) ×2 IMPLANT
GOWN STRL REUS W/TWL LRG LVL3 (GOWN DISPOSABLE) ×4
GUIDEWIRE GREEN .038 145CM (MISCELLANEOUS) ×2 IMPLANT
GUIDEWIRE SUPER STIFF (WIRE) ×2 IMPLANT
INFUSOR MANOMETER BAG 3000ML (MISCELLANEOUS) ×2 IMPLANT
INTRODUCER DILATOR DOUBLE (INTRODUCER) ×2 IMPLANT
KIT RM TURNOVER CYSTO AR (KITS) ×2 IMPLANT
PACK CYSTO AR (MISCELLANEOUS) ×2 IMPLANT
SCRUB POVIDONE IODINE 4 OZ (MISCELLANEOUS) ×2 IMPLANT
SENSORWIRE 0.038 NOT ANGLED (WIRE) ×2
SET CYSTO W/LG BORE CLAMP LF (SET/KITS/TRAYS/PACK) ×2 IMPLANT
SHEATH URETERAL 12FRX35CM (MISCELLANEOUS) ×2 IMPLANT
SOL .9 NS 3000ML IRR  AL (IV SOLUTION) ×1
SOL .9 NS 3000ML IRR AL (IV SOLUTION) ×1
SOL .9 NS 3000ML IRR UROMATIC (IV SOLUTION) ×1 IMPLANT
STENT URET 6FRX24 CONTOUR (STENTS) ×2 IMPLANT
STENT URET 6FRX26 CONTOUR (STENTS) IMPLANT
SURGILUBE 2OZ TUBE FLIPTOP (MISCELLANEOUS) ×2 IMPLANT
SYRINGE IRR TOOMEY STRL 70CC (SYRINGE) ×2 IMPLANT
WATER STERILE IRR 1000ML POUR (IV SOLUTION) ×2 IMPLANT
WIRE SENSOR 0.038 NOT ANGLED (WIRE) ×1 IMPLANT

## 2016-05-14 NOTE — Anesthesia Post-op Follow-up Note (Cosign Needed)
Anesthesia QCDR form completed.        

## 2016-05-14 NOTE — Anesthesia Preprocedure Evaluation (Signed)
Anesthesia Evaluation  Patient identified by MRN, date of birth, ID band Patient awake    Reviewed: Allergy & Precautions, NPO status , Patient's Chart, lab work & pertinent test results  History of Anesthesia Complications Negative for: history of anesthetic complications  Airway Mallampati: I  TM Distance: >3 FB Neck ROM: Full    Dental no notable dental hx.    Pulmonary neg pulmonary ROS, neg sleep apnea, neg COPD,    breath sounds clear to auscultation- rhonchi (-) wheezing      Cardiovascular hypertension, (-) CAD and (-) Past MI  Rhythm:Regular Rate:Normal - Systolic murmurs and - Diastolic murmurs    Neuro/Psych  Headaches, PSYCHIATRIC DISORDERS Anxiety Depression Bipolar Disorder    GI/Hepatic Neg liver ROS, GERD  ,  Endo/Other  negative endocrine ROSneg diabetes  Renal/GU Renal disease: nephrolithiasis.     Musculoskeletal  (+) Fibromyalgia -  Abdominal (+) + obese,   Peds  Hematology negative hematology ROS (+)   Anesthesia Other Findings Past Medical History: No date: Abdominal pain, other specified site No date: Anxiety state, unspecified No date: Bipolar disorder, unspecified No date: Depressive disorder, not elsewhere classified No date: Edema No date: Esophageal reflux No date: History of kidney stones No date: Mild or unspecified pre-eclampsia, unspecified* No date: Morbid obesity (HCC) No date: Myalgia and myositis, unspecified No date: Other dyspnea and respiratory abnormality No date: Preeclampsia     Comment: with first pregnancy No date: Urinary tract infection, site not specified   Reproductive/Obstetrics                             Anesthesia Physical  Anesthesia Plan  ASA: II  Anesthesia Plan: General   Post-op Pain Management:    Induction: Intravenous  Airway Management Planned: Oral ETT  Additional Equipment:   Intra-op Plan:    Post-operative Plan: Extubation in OR  Informed Consent: I have reviewed the patients History and Physical, chart, labs and discussed the procedure including the risks, benefits and alternatives for the proposed anesthesia with the patient or authorized representative who has indicated his/her understanding and acceptance.   Dental advisory given  Plan Discussed with: CRNA and Anesthesiologist  Anesthesia Plan Comments:         Anesthesia Quick Evaluation

## 2016-05-14 NOTE — Interval H&P Note (Signed)
History and Physical Interval Note:  05/14/2016 7:26 AM  Sierra [name]  has presented today for surgery, with the diagnosis of left ureteral stone  The various methods of treatment have been discussed with the patient and family. After consideration of risks, benefits and other options for treatment, the patient has consented to  Procedure(s): URETEROSCOPY WITH HOLMIUM LASER LITHOTRIPSY (Left) CYSTOSCOPY WITH STENT REPLACEMENT (Left) as a surgical intervention .  The patient's history has been reviewed, patient examined, no change in status, stable for surgery.  I have reviewed the patient's chart and labs.  Questions were answered to the patient's satisfaction.    RRR CTAB  Repeat Cx neg   [name]

## 2016-05-14 NOTE — Op Note (Signed)
Date of procedure: 05/14/16  Preoperative diagnosis:  1. Left ureteral stone   Postoperative diagnosis:  1. Same as above   Procedure: 1. Left ureteroscopy 2. Laser lithotripsy 3. Left ureteral stent exchange 4. Left retrograde pyelogram 5. Left basket extraction of stone fragments  Surgeon: [name], MD  Anesthesia: General  Complications: None  Intraoperative findings: 11 mm previous left proximal ureteral stone pushed up into a midpole calyx. Enteric stone obliterated and all fragments removed. Stent exchanged.  EBL: Minimal  Specimens: Stone fragment  Drains: 6 x 24 French double-J ureteral stent on left, string left in place  Indication: Sierra [name] is a 36 y.o. patient with injecting left 11 mm proximal ureteral stone status post emergent ureteral stent placement and admission for pyelonephritis. She returns today for definitive management of her stone.  After reviewing the management options for treatment, she elected to proceed with the above surgical procedure(s). We have discussed the potential benefits and risks of the procedure, side effects of the proposed treatment, the likelihood of the patient achieving the goals of the procedure, and any potential problems that might occur during the procedure or recuperation. Informed consent has been obtained.  Description of procedure:  The patient was taken to the operating room and general anesthesia was induced.  The patient was placed in the dorsal lithotomy position, prepped and draped in the usual sterile fashion, and preoperative antibiotics were administered. A preoperative time-out was performed.   A 21 French scope was advanced per urethra into the bladder. Attention was turned to the left ureteral orifice from which a ureteral stent was seen emanating. The distal coil of the stent was grasped and brought to level of the urethral meatus. The stent was then cannulated the level of the kidney under fluoroscopic  guidance. A stone shadow could be seen in a midpole calyx. The stent was removed leaving the wire place.  A dual lumen sheath was then advanced into the mid ureter and a second Super Stiff wire was introduced. A 12/14 Cook access sheath was then advanced easily under fluoroscopic guidance up to level of the proximal ureter and the inner cannula was removed. An 8 French dual-lumen flexible ureteroscope was then advanced up to level of stone in the mid pole without difficulty. A 270  laser fiber was brought in and using testing settings of 0.2 J and 40 Hz, the stone was dusted into tiny fragments. Once all stone fragments were quite small and not radiographically evident, a 1.9 [place] nitinol basket was used to extract all significant stone debris burden. The scope was then backed down to the level of the UPJ and contrast material was injected to create a roadmap of the kidney. Each and every calyx was then directly visualized and there is no significant residual stone burden. Scope was then backed down the length of the ureter removing the access sheath along the way. There were no residual stone fragments or any ureteral trauma or edema appreciated. A 6 x 24 French double-J ureteral stent was then advanced over the safety wire up to level of the renal pelvis. The wire was partially drawn until full coil was noted within the renal pelvis. The wire was then fully withdrawn and a full coil was noted within the bladder. The bladder was then drained using the cystoscope sheath. The stent string was left attached to the distal coil of the ureteral stent which was affixed to the patient's left inner thigh using Mastisol and Tegaderm. She was then  cleaned and dried, repositioned the supine position, reversed from anesthesia, and taken to the PACU in stable condition.  Plan: Patient will remove her own stent on Thursday. We'll follow-up with her in 4 weeks with a renal ultrasound just prior to this.  [name],  M.D.

## 2016-05-14 NOTE — Discharge Instructions (Signed)
You have a ureteral stent in place.  This is a tube that extends from your kidney to your bladder.  This may cause urinary bleeding, burning with urination, and urinary frequency.  Please call our office or present to the ED if you develop fevers >101 or pain which is not able to be controlled with oral pain medications.  You may be given either Flomax and/ or ditropan to help with bladder spasms and stent pain in addition to pain medications.    Your stent is attached to a sting.  On Thursday, T catch the stent string and pulled gently until the entire stent is removed. If you have any issues or concerns, feel free to contact her office for assistance.  [facility] [address], [address] [place], [place] [address] [phone]   AMBULATORY SURGERY  DISCHARGE INSTRUCTIONS   1) The drugs that you were given will stay in your system until tomorrow so for the next 24 hours you should not:  A) Drive an automobile B) Make any legal decisions C) Drink any alcoholic beverage   2) You may resume regular meals tomorrow.  Today it is better to start with liquids and gradually work up to solid foods.  You may eat anything you prefer, but it is better to start with liquids, then soup and crackers, and gradually work up to solid foods.   3) Please notify your doctor immediately if you have any unusual bleeding, trouble breathing, redness and pain at the surgery site, drainage, fever, or pain not relieved by medication.    4) Additional Instructions:        Please contact your physician with any problems or Same Day Surgery at [phone], Monday through Friday 6 am to 4 pm, or [place] [facility] number at [phone].

## 2016-05-14 NOTE — Transfer of Care (Signed)
Immediate Anesthesia Transfer of Care Note  Patient: Sierra [name]  Procedure(s) Performed: Procedure(s): URETEROSCOPY WITH HOLMIUM LASER LITHOTRIPSY (Left) CYSTOSCOPY WITH STENT REPLACEMENT (Left)  Patient Location: PACU  Anesthesia Type:General  Level of Consciousness: awake, alert  and oriented  Airway & Oxygen Therapy: Patient Spontanous Breathing and Patient connected to face mask oxygen  Post-op Assessment: Report given to RN and Post -op Vital signs reviewed and stable  Post vital signs: Reviewed and stable  Last Vitals:  Vitals:   05/14/16 0642 05/14/16 0906  BP: (!) 132/97 128/80  Pulse: 88 (!) 103  Resp:  17  Temp: 36.1 C 36.9 C    Last Pain:  Vitals:   05/14/16 0906  TempSrc:   PainSc: Asleep         Complications: No apparent anesthesia complications

## 2016-05-14 NOTE — Anesthesia Postprocedure Evaluation (Signed)
Anesthesia Post Note  Patient: Sierra [name]  Procedure(s) Performed: Procedure(s) (LRB): URETEROSCOPY WITH HOLMIUM LASER LITHOTRIPSY (Left) CYSTOSCOPY WITH STENT REPLACEMENT (Left)  Patient location during evaluation: PACU Anesthesia Type: General Level of consciousness: awake and alert Pain management: pain level controlled Vital Signs Assessment: post-procedure vital signs reviewed and stable Respiratory status: spontaneous breathing, nonlabored ventilation, respiratory function stable and patient connected to nasal cannula oxygen Cardiovascular status: blood pressure returned to baseline and stable Postop Assessment: no signs of nausea or vomiting Anesthetic complications: no     Last Vitals:  Vitals:   05/14/16 1007 05/14/16 1023  BP: 130/61 (!) 142/53  Pulse: 88 98  Resp: 16 16  Temp: 37.2 C     Last Pain:  Vitals:   05/14/16 1007  TempSrc: Oral  PainSc:                  Lenard Simmer

## 2016-05-14 NOTE — H&P (View-Only) (Signed)
05/10/2016 3:16 PM   Sierra [name] 12-14-1980 [id number]  Referring provider: Armando Gang, FNP [address] [place], [place] [address]  Chief Complaint  Patient presents with  . Nephrolithiasis    New Patient    HPI: 36 year old female with an 11 mm left proximal obstructing ureteral calculus who underwent urgent left ureteral stent placement on 04/25/2016 in the setting of UTI/pyelonephritis. She remained in the hospital for several days postoperatively with fevers to 103. She ultimately grew Klebsiella pneumonia in her urine assistant to ampicillin and nitrofurantoin. She was ultimately discharged home in stable condition. She returns to the office today to discuss definitive management of her stone.  She does have a personal history of kidney stones and has passed multiple stones spontaneously over the past several years. Her first stone was at age 58 requiring shockwave lithotripsy. She also has a strong family history of stones.  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  Overall, she was feeling better until yesterday when she started having worsening malaise, lower abdominal pain, urgency and frequency. No fevers.  She has a few additional days of antibiotics.  She did go to preadmission testing tomorrow for upcoming planned procedure and urine cultures pending.  PMH: Past Medical History:  Diagnosis Date  . Abdominal pain, other specified site   . Anxiety state, unspecified   . Bipolar disorder, unspecified (HCC)   . Depressive disorder, not elsewhere classified   . Edema   . Esophageal reflux   . Fibromyalgia   . Headache   . History of kidney stones   . Mild or unspecified pre-eclampsia, unspecified as to episode of care   . Morbid obesity (HCC)   . Myalgia and myositis, unspecified   . Other dyspnea and respiratory abnormality   . Preeclampsia    with first pregnancy  . Urinary tract infection, site not  specified     Surgical History: Past Surgical History:  Procedure Laterality Date  . CESAREAN SECTION     X 2  . CHOLECYSTECTOMY  03/2006  . CYSTOSCOPY WITH STENT PLACEMENT Left 04/25/2016   Procedure: CYSTOSCOPY WITH STENT PLACEMENT;  Surgeon: [name], MD;  Location: [facility] ORS;  Service: Urology;  Laterality: Left;  . DILATION AND CURETTAGE OF UTERUS    . IVC FILTER INSERTION  03/26/2016   [facility]  . LAPAROSCOPIC GASTRIC RESTRICTIVE DUODENAL PROCEDURE (DUODENAL SWITCH)  03/2016  . LITHOTRIPSY    . TONSILLECTOMY AND ADENOIDECTOMY      Home Medications:  Allergies as of 05/10/2016   No Known Allergies     Medication List       Accurate as of 05/10/16  3:16 PM. Always use your most recent med list.          Biotin 10 MG Caps Take 5,000 mg by mouth daily.   lamoTRIgine 150 MG tablet Commonly known as:  LAMICTAL Take 150 mg by mouth at bedtime.   lamoTRIgine 25 MG Chew chewable tablet Commonly known as:  LAMICTAL Chew 4 tablets (100 mg total) by mouth daily.   multivitamin tablet Take 1 tablet by mouth daily.   omeprazole 20 MG capsule Commonly known as:  PRILOSEC Take 20 mg by mouth 2 (two) times daily.   oxybutynin 5 MG tablet Commonly known as:  DITROPAN Take 1 tablet (5 mg total) by mouth every 8 (eight) hours as needed for bladder spasms.   oxyCODONE-acetaminophen 5-325 MG tablet Commonly known as:  PERCOCET/ROXICET Take 1-2 tablets  by mouth every 4 (four) hours as needed for moderate pain.   sulfamethoxazole-trimethoprim 800-160 MG tablet Commonly known as:  BACTRIM DS,SEPTRA DS Take 1 tablet by mouth 2 (two) times daily.   tamsulosin 0.4 MG Caps capsule Commonly known as:  FLOMAX Take 1 capsule (0.4 mg total) by mouth daily.       Allergies: No Known Allergies  Family History: Family History  Problem Relation Age of Onset  . Depression Father   . Alcohol abuse Father   . Depression Mother   . Hyperlipidemia Mother   . Sleep apnea  Mother   . Depression Sister   . Hyperlipidemia Brother   . Depression Brother   . Hyperlipidemia Brother   . Depression Brother   . Alcohol abuse Brother   . Coronary artery disease Maternal Grandmother   . Heart attack Maternal Grandmother   . Diabetes Maternal Grandmother   . Lung cancer Maternal Grandfather   . Emphysema Maternal Grandfather   . Coronary artery disease Maternal Grandfather   . Lupus      Aunt  . Fibromyalgia      Aunt    Social History:  reports that she has never smoked. She has never used smokeless tobacco. She reports that she drinks alcohol. She reports that she does not use drugs.  ROS: UROLOGY Frequent Urination?: Yes Hard to postpone urination?: Yes Burning/pain with urination?: No Get up at night to urinate?: Yes Leakage of urine?: Yes Urine stream starts and stops?: Yes Trouble starting stream?: Yes Do you have to strain to urinate?: Yes Blood in urine?: Yes Urinary tract infection?: Yes Sexually transmitted disease?: No Injury to kidneys or bladder?: No Painful intercourse?: No Weak stream?: No Currently pregnant?: No Vaginal bleeding?: No Last menstrual period?: 04/28/16  Gastrointestinal Nausea?: Yes Vomiting?: Yes Indigestion/heartburn?: No Diarrhea?: No Constipation?: No  Constitutional Fever: No Night sweats?: No Weight loss?: Yes Fatigue?: Yes  Skin Skin rash/lesions?: No Itching?: Yes  Eyes Blurred vision?: No Double vision?: No  Ears/Nose/Throat Sore throat?: No Sinus problems?: No  Hematologic/Lymphatic Swollen glands?: No Easy bruising?: Yes  Cardiovascular Leg swelling?: No Chest pain?: No  Respiratory Cough?: No Shortness of breath?: No  Endocrine Excessive thirst?: Yes  Musculoskeletal Back pain?: Yes Joint pain?: No  Neurological Headaches?: Yes Dizziness?: Yes  Psychologic Depression?: No Anxiety?: No  Physical Exam: BP 119/82   Pulse (!) 106   Ht  (1.626 m)   Wt (!) 339  lb (153.8 kg)   LMP 04/28/2016 (Exact Date)   BMI 58.19 kg/m   Constitutional:  Alert and oriented, No acute distress. HEENT: [place] AT, moist mucus membranes.  Trachea midline, no masses. Cardiovascular: No clubbing, cyanosis, or edema. Respiratory: Normal respiratory effort, no increased work of breathing. GI: Abdomen is soft, nontender, nondistended, no abdominal masses.  Morbidly obese. GU: No CVA tenderness. Skin: No rashes, bruises or suspicious lesions. Neurologic: Grossly intact, no focal deficits, moving all 4 extremities. Psychiatric: Normal mood and affect.  Laboratory Data: Lab Results  Component Value Date   WBC 11.8 (H) 04/26/2016   HGB 14.2 04/26/2016   HCT 42.1 04/26/2016   MCV 89.2 04/26/2016   PLT 171 04/26/2016    Lab Results  Component Value Date   CREATININE 0.89 04/26/2016    Lab Results  Component Value Date   HGBA1C 5.0 03/23/2014    Urinalysis Component     Latest Ref Rng & Units 05/08/2016        11:39 AM  Specific  Gravity, UA     1.005 - 1.030 >1.030 (H)  pH, UA     5.0 - 7.5 6.0  Color, UA     Yellow Red (A)  Appearance Ur     Clear Turbid (A)  Leukocytes, UA     Negative 1+ (A)  Protein, UA     Negative/Trace 3+ (A)  Glucose     Negative Negative  Ketones, UA     Negative 4+ (A)  RBC, UA     Negative 3+ (A)  Bilirubin, UA     Negative Negative  Urobilinogen, Ur     0.2 - 1.0 mg/dL 1.0  Nitrite, UA     Negative Negative  Microscopic Examination      See below:    Pertinent Imaging: CLINICAL DATA:  LEFT flank pain, nausea and vomiting.  EXAM: CT ABDOMEN AND PELVIS WITHOUT CONTRAST  TECHNIQUE: Multidetector CT imaging of the abdomen and pelvis was performed following the standard protocol without IV contrast.  COMPARISON:  CT 01/27/2012  FINDINGS: Lower chest: Lung bases are clear.  Hepatobiliary: No focal hepatic lesion. Postcholecystectomy. No biliary dilatation.  Pancreas: Pancreas is normal. No ductal  dilatation. No pancreatic inflammation.  Spleen: Normal spleen  Adrenals/urinary tract: Adrenal glands are normal. Obstructing calculus in the proximal LEFT ureter at the ureteral pelvic junction. This calculus measures 11 mm in craniocaudad dimension and 8 mm in axial dimension (image 45, series 2). This calculus is evident on the CT topogram LEFT of the L3 vertebral body.  No RIGHT ureteral calculi.  No bladder calculi.  Mild hydronephrosis proximal obstructing LEFT ureteral calculus.  Stomach/Bowel: Post gastric sleeve anatomy. Duodenum small bowel are normal. The appendix and colon are normal.  Vascular/Lymphatic: Abdominal aorta is normal caliber. There is no retroperitoneal or periportal lymphadenopathy. No pelvic lymphadenopathy.  Reproductive: Uterus and ovaries normal.  IUD in expected location  Other: Focal umbilical hernia measuring 3.7 cm. Narrow mouth midline.  Musculoskeletal: No aggressive osseous lesion.  IMPRESSION: 1. Large obstructing calculus within the proximal LEFT ureter at the ureteropelvic junction. Mild hydronephrosis. 2. Calculus evident on the CT topogram.   Electronically Signed   By: Genevive Bi M.D.   On: 04/25/2016 20:25  CT scan was personally reviewed today with the patient  Assessment & Plan:    1. Left ureteral calculus We discussed various treatment options including ESWL vs. ureteroscopy, laser lithotripsy, and stent. We discussed the risks and benefits of both including bleeding, infection, damage to surrounding structures, efficacy with need for possible further intervention, and need for temporary ureteral stent.  Given her habitus, I feel that she would have a high stone free rate with ureteroscopy. She is agreeable with this plan.   2. History of kidney stones Will send stone of analysis At risk for future stones given recent gastric bypass  3. History of pyelonephritis Complete course of abx Will double  cover with abx at the time of surgery If urine cultures clonal, will continue antibiotics through surgery  Schedule left URS, LL, stent exchange  [name], MD  [facility] [address] [place], [place] [address] [phone]

## 2016-05-14 NOTE — OR Nursing (Signed)
Dr. Apolinar Junes in to see pt 1008 am and signed script

## 2016-05-14 NOTE — Anesthesia Procedure Notes (Signed)
Procedure Name: Intubation Date/Time: 05/14/2016 7:50 AM Performed by: Jonna Clark Pre-anesthesia Checklist: Patient identified, Patient being monitored, Timeout performed, Emergency Drugs available and Suction available Patient Re-evaluated:Patient Re-evaluated prior to inductionOxygen Delivery Method: Circle system utilized Preoxygenation: Pre-oxygenation with 100% oxygen Intubation Type: IV induction Ventilation: Mask ventilation without difficulty Laryngoscope Size: Mac and 3 Grade View: Grade I Tube type: Oral Tube size: 7.0 mm Number of attempts: 1 Airway Equipment and Method: Stylet Placement Confirmation: ETT inserted through vocal cords under direct vision,  positive ETCO2 and breath sounds checked- equal and bilateral Secured at: 21 cm Tube secured with: Tape Dental Injury: Teeth and Oropharynx as per pre-operative assessment

## 2016-05-17 ENCOUNTER — Emergency Department

## 2016-05-17 ENCOUNTER — Encounter: Payer: Self-pay | Admitting: *Deleted

## 2016-05-17 ENCOUNTER — Emergency Department
Admission: EM | Admit: 2016-05-17 | Discharge: 2016-05-17 | Disposition: A | Discharge status: Home / Self Care | Attending: Emergency Medicine | Admitting: Emergency Medicine

## 2016-05-17 DIAGNOSIS — M25512 Pain in left shoulder: Secondary | ICD-10-CM | POA: Diagnosis not present

## 2016-05-17 DIAGNOSIS — R6884 Jaw pain: Secondary | ICD-10-CM | POA: Insufficient documentation

## 2016-05-17 DIAGNOSIS — I10 Essential (primary) hypertension: Secondary | ICD-10-CM | POA: Diagnosis not present

## 2016-05-17 DIAGNOSIS — Z79899 Other long term (current) drug therapy: Secondary | ICD-10-CM | POA: Diagnosis not present

## 2016-05-17 DIAGNOSIS — R1013 Epigastric pain: Secondary | ICD-10-CM | POA: Diagnosis not present

## 2016-05-17 DIAGNOSIS — M25522 Pain in left elbow: Secondary | ICD-10-CM | POA: Diagnosis not present

## 2016-05-17 LAB — HEPATIC FUNCTION PANEL
ALBUMIN: 3.8 g/dL (ref 3.5–5.0)
ALT: 27 U/L (ref 14–54)
AST: 32 U/L (ref 15–41)
Alkaline Phosphatase: 75 U/L (ref 38–126)
Bilirubin, Direct: 0.2 mg/dL (ref 0.1–0.5)
Indirect Bilirubin: 0.3 mg/dL (ref 0.3–0.9)
Total Bilirubin: 0.5 mg/dL (ref 0.3–1.2)
Total Protein: 7.4 g/dL (ref 6.5–8.1)

## 2016-05-17 LAB — TROPONIN I

## 2016-05-17 LAB — BASIC METABOLIC PANEL
ANION GAP: 9 (ref 5–15)
BUN: 14 mg/dL (ref 6–20)
CALCIUM: 9.8 mg/dL (ref 8.9–10.3)
CHLORIDE: 103 mmol/L (ref 101–111)
CO2: 25 mmol/L (ref 22–32)
CREATININE: 0.82 mg/dL (ref 0.44–1.00)
GFR calc non Af Amer: >60 mL/min (ref >60)
Glucose, Bld: 103 mg/dL — ABNORMAL HIGH (ref 65–99)
Potassium: 3.6 mmol/L (ref 3.5–5.1)
SODIUM: 137 mmol/L (ref 135–145)

## 2016-05-17 LAB — CBC
HCT: 40.5 % (ref 35.0–47.0)
HEMOGLOBIN: 13.5 g/dL (ref 12.0–16.0)
MCH: 29 pg (ref 26.0–34.0)
MCHC: 33.4 g/dL (ref 32.0–36.0)
MCV: 86.7 fL (ref 80.0–100.0)
PLATELETS: 239 10*3/uL (ref 150–440)
RBC: 4.67 MIL/uL (ref 3.80–5.20)
RDW: 15.1 % — ABNORMAL HIGH (ref 11.5–14.5)
WBC: 4.3 10*3/uL (ref 3.6–11.0)

## 2016-05-17 LAB — LIPASE, BLOOD: LIPASE: 49 U/L (ref 11–51)

## 2016-05-17 MED ORDER — ONDANSETRON 4 MG PO TBDP
ORAL_TABLET | ORAL | Status: AC
  Administered 2016-05-17: 4 mg via ORAL
  Filled 2016-05-17: qty 1

## 2016-05-17 MED ORDER — ONDANSETRON 4 MG PO TBDP
4.0000 mg | ORAL_TABLET | Freq: Once | ORAL | Status: AC
  Administered 2016-05-17: 4 mg via ORAL

## 2016-05-17 NOTE — ED Triage Notes (Signed)
Pt reports having a "weird feeling in chest" and bilateral jaw and shoulder pain and pain in left elbow. Pt is nauseous and having dry heaves in lobby. No vomiting reported at home. Pt denies SOB. Pt reports feeling generalized weakness over the past week. Pt pale in triage and diaphoretic. No fevers reported.

## 2016-05-17 NOTE — ED Provider Notes (Signed)
[facility] Emergency Department Provider Note  ____________________________________________   First MD Initiated Contact with Patient 05/17/16 1408     (approximate)  I have reviewed the triage vital signs and the nursing notes.   HISTORY  Chief Complaint Chest Pain and Jaw Pain    HPI Valleri Hendricksen is a 36 y.o. female with an extensive past medical history as described below and who has recently had 2 different urological procedures for large renal calculi as well as having a bariatric surgery with Dr. Alva Garnet about 6 weeks ago who presents for evaluation of a variety of complaints.  In summary, she complains of (1) pain in bilateral sides of her jaw, her left shoulder, her left elbow, as well as (2) discomfort in her epigastrium.  (1)  she reports that these pains have been present for several days, gradually getting worse.  They are aching and dull and not made better by anything in particular.  She can touch to both sides of her jaw and they hurt.  She has had no trauma or injury.  She reports that she does not grind her teeth.  She has no dental infections.  She does not have a sore throat and has no difficulty swallowing or speaking.  She has had no trauma or injury to her shoulder or elbow for which she is aware.  She describes the pain as moderate.  (2)  she reports that this started relatively acutely when she awoke this morning.  She feels bloated and full with a dull ache "like when he drinks too much water too fast".  She says that she gagged earlier but has not had any vomiting and does not really feel nauseated.  She says it does not make it better or worse when she eats or drinks but she is limited by her bariatric diet in terms of how much she eats and what kind of food and protein drinks that she consumes.  She describes this discomfort as moderate.  She denies fever/chills, chest pain, shortness of breath, lower abdominal pain, dysuria, flank  pain.   Past Medical History:  Diagnosis Date  . Abdominal pain, other specified site   . Anxiety state, unspecified   . Bipolar disorder, unspecified (HCC)   . Depressive disorder, not elsewhere classified   . Edema   . Esophageal reflux   . Fibromyalgia   . Headache   . History of kidney stones   . Mild or unspecified pre-eclampsia, unspecified as to episode of care   . Morbid obesity (HCC)   . Myalgia and myositis, unspecified   . Other dyspnea and respiratory abnormality   . Preeclampsia    with first pregnancy  . Raynaud's disease   . Urinary tract infection, site not specified     Patient Active Problem List   Diagnosis Date Noted  . Left ureteral calculus 04/25/2016  . Tingling in extremities 08/29/2015  . Acute nonintractable headache 08/29/2015  . Visual disturbance 08/23/2015  . Polydipsia 03/23/2014  . Binge eating disorder 03/23/2014  . Vaginitis and vulvovaginitis 12/31/2013  . Paronychia 09/21/2013  . Essential hypertension, benign 06/05/2013  . Bilateral leg pain 05/22/2013  . Right carpal tunnel syndrome 09/16/2012  . General counseling and advice on female contraception 01/22/2012  . Anhydramnios 01/02/2012  . Umbilical cord complication 12/07/2011  . High-risk pregnancy 11/09/2011  . 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 H/O cesarean section 10/12/2011  . Calculus of kidney 10/12/2011  . Obesity affecting pregnancy,  antepartum 10/12/2011  . Bipolar affective disorder (HCC) 10/12/2011  . History of cesarean section 10/12/2011  . SNORING 07/23/2007  . EDEMA 06/11/2007  . MORBID OBESITY 12/23/2006  . BIPOLAR AFFECTIVE DISORDER 12/23/2006  . ANXIETY 12/23/2006  . DEPRESSION 12/23/2006  . GERD 12/23/2006  . MILD/UNSPEC PRE-ECLAMPSIA UNSPEC AS EPISODE CARE 12/23/2006  . Fibromyalgia 12/23/2006    Past Surgical History:  Procedure Laterality Date  . CESAREAN SECTION     X 2  . CHOLECYSTECTOMY  03/2006  . CYSTOSCOPY W/ URETERAL STENT  PLACEMENT Left 05/14/2016   Procedure: CYSTOSCOPY WITH STENT REPLACEMENT;  Surgeon: [name], MD;  Location: [facility] ORS;  Service: Urology;  Laterality: Left;  . CYSTOSCOPY WITH STENT PLACEMENT Left 04/25/2016   Procedure: CYSTOSCOPY WITH STENT PLACEMENT;  Surgeon: [name], MD;  Location: [facility] OR[facility];  Service: Urology;  Laterality: Left;  . DILATION AND CURETTAGE OF UTERUS    . IVC FILTER INSERTION  03/26/2016   [facility]  . LAPAROSCOPIC GASTRIC RESTRICTIVE DUODENAL PROCEDURE (DUODENAL SWITCH)  03/2016  . LITHOTRIPSY    . TONSILLECTOMY AND ADENOIDECTOMY    . URETEROSCOPY WITH HOLMIUM LASER LITHOTRIPSY Left 05/14/2016   Procedure: URETEROSCOPY WITH HOLMIUM LASER LITHOTRIPSY;  Surgeon: [name], MD;  Location: [facility] OR[facility];  Service: Urology;  Laterality: Left;    Prior to Admission medications   Medication Sig Start Date End Date Taking? Authorizing Provider  Biotin 10 MG CAPS Take 5,000 mg by mouth daily.     Historical Provider, MD  lamoTRIgine (LAMICTAL) 150 MG tablet Take 150 mg by mouth at bedtime.    Historical Provider, MD  lamoTRIgine (LAMICTAL) 25 MG CHEW chewable tablet Chew 4 tablets (100 mg total) by mouth daily. 03/21/16   Brandy Hale, MD  Multiple Vitamin (MULTIVITAMIN) tablet Take 1 tablet by mouth daily.    Historical Provider, MD  omeprazole (PRILOSEC) 20 MG capsule Take 20 mg by mouth 2 (two) times daily.    Historical Provider, MD  oxybutynin (DITROPAN) 5 MG tablet Take 1 tablet (5 mg total) by mouth every 8 (eight) hours as needed for bladder spasms. 04/28/16   Malen Gauze, MD  oxyCODONE-acetaminophen (PERCOCET/ROXICET) 5-325 MG tablet Take 1-2 tablets by mouth every 4 (four) hours as needed for moderate pain. 05/14/16   [name], MD  sulfamethoxazole-trimethoprim (BACTRIM DS,SEPTRA DS) 800-160 MG tablet Take 1 tablet by mouth 2 (two) times daily. 04/28/16   Malen Gauze, MD  tamsulosin (FLOMAX) 0.4 MG CAPS capsule Take 1 capsule (0.4 mg total) by  mouth daily. 04/28/16   Malen Gauze, MD    Allergies Patient has no known allergies.  Family History  Problem Relation Age of Onset  . Depression Father   . Alcohol abuse Father   . Depression Mother   . Hyperlipidemia Mother   . Sleep apnea Mother   . Depression Sister   . Hyperlipidemia Brother   . Depression Brother   . Hyperlipidemia Brother   . Depression Brother   . Alcohol abuse Brother   . Coronary artery disease Maternal Grandmother   . Heart attack Maternal Grandmother   . Diabetes Maternal Grandmother   . Lung cancer Maternal Grandfather   . Emphysema Maternal Grandfather   . Coronary artery disease Maternal Grandfather   . Lupus      Aunt  . Fibromyalgia      Aunt    Social History Social History  Substance Use Topics  . Smoking status: Never Smoker  . Smokeless tobacco: Never  Used  . Alcohol use 0.0 - 0.6 oz/week     Comment: occasional < 1 month    Review of Systems Constitutional: No fever/chills Eyes: No visual changes. ENT: No sore throat. Cardiovascular: Denies chest pain. Respiratory: Denies shortness of breath. Gastrointestinal: Epigastric fullness and bloating.  Mild nausea, no vomiting, some "gagging".  No diarrhea.  No constipation. Genitourinary: Negative for dysuria. Musculoskeletal: Pain in bilateral sides of jaw, left shoulder, and left elbow Integumentary: Negative for rash. Neurological: Negative for headaches, focal weakness or numbness.   ____________________________________________   PHYSICAL EXAM:  VITAL SIGNS: ED Triage Vitals  Enc Vitals Group     BP 05/17/16 1330 124/77     Pulse Rate 05/17/16 1330 64     Resp 05/17/16 1330 18     Temp 05/17/16 1330 98.2 F (36.8 C)     Temp Source 05/17/16 1330 Oral     SpO2 05/17/16 1330 96 %     Weight 05/17/16 1223 (!) 339 lb (153.8 kg)     Height 05/17/16 1223 5\' 4"  (1.626 m)     Head Circumference --      Peak Flow --      Pain Score 05/17/16 1222 4     Pain Loc  --      Pain Edu? --      Excl. in GC? --     Constitutional: Alert and oriented. Well appearing and in no acute distress. Eyes: Conjunctivae are normal. PERRL. EOMI. Head: Atraumatic. Nose: No congestion/rhinnorhea. Mouth/Throat: Mucous membranes are moist.  No dental caries are evident.  There is no evidence of intraoral, dental, nor pharyngeal abscesses.  She reports tenderness to palpation along the curvature of the mandible bilaterally but there are no palpable abnormalities, lymphadenopathy, or changes in the skin to indicate any sort of infection. Neck: No stridor.  No meningeal signs.   Cardiovascular: Normal rate, regular rhythm. Good peripheral circulation. Grossly normal heart sounds. Respiratory: Normal respiratory effort.  No retractions. Lungs CTAB. Gastrointestinal: Morbid obesity.  Soft with very mild tenderness to palpation of the epigastrium. No rebound/guarding.  No distention.  Musculoskeletal: No lower extremity tenderness nor edema. No gross deformities of extremities.  Full range of motion of left shoulder and left elbow with no apparent pain or tenderness to palpation. Neurologic:  Normal speech and language. No gross focal neurologic deficits are appreciated.  Skin:  Skin is warm, dry and intact. No rash noted. Psychiatric: Mood and affect are flat. Speech and behavior are normal.  ____________________________________________   LABS (all labs ordered are listed, but only abnormal results are displayed)  Labs Reviewed  BASIC METABOLIC PANEL - Abnormal; Notable for the following:       Result Value   Glucose, Bld 103 (*)    All other components within normal limits  CBC - Abnormal; Notable for the following:    RDW 15.1 (*)    All other components within normal limits  TROPONIN I  HEPATIC FUNCTION PANEL  LIPASE, BLOOD   ____________________________________________  EKG  ED ECG REPORT I, Otilio Groleau, the attending physician, personally viewed and  interpreted this ECG.  Date: 05/17/2016 EKG Time: 12:22 Rate: 101 Rhythm: borderline sinus tachycardia QRS Axis: normal Intervals: borderline LVH, otherwise normal ST/T Wave abnormalities: normal Conduction Disturbances: none Narrative Interpretation: unremarkable  ____________________________________________  RADIOLOGY   Dg Chest 2 View  Result Date: 05/17/2016 CLINICAL DATA:  Chest tightness and chest pain radiating to the jaw and left arm beginning this morning. EXAM:  CHEST  2 VIEW COMPARISON:  10/14/2015 FINDINGS: Heart size is normal. Mediastinal shadows are normal. The lungs are clear. No bronchial thickening. No infiltrate, mass, effusion or collapse. Pulmonary vascularity is normal. No bony abnormality. IMPRESSION: Normal chest Electronically Signed   By: Paulina Fusi M.D.   On: 05/17/2016 13:07    ____________________________________________   PROCEDURES  Critical Care performed: No   Procedure(s) performed:   Procedures   ____________________________________________   INITIAL IMPRESSION / ASSESSMENT AND PLAN / ED COURSE  Pertinent labs & imaging results that were available during my care of the patient were reviewed by me and considered in my medical decision making (see chart for details).  The patient has normal vital signs and is afebrile.  Her initial labs which include a BMP and CBC and troponin are normal and her EKG is normal.  She reports that she had an IVC filter placed during her bariatric surgery and there is nothing to suggest she has a PE (Wells score 1.5 given recent surgeries).  There is no specific diagnosis I can think of to connect pain in both sides of her jaw, left elbow, left shoulder, and epigastric discomfort.  She has no significant tenderness to palpation of the abdomen including in her right upper quadrant.  Given that she had bariatric surgery about 6 weeks ago I will obtain some plain films to look for any signs of air-fluid levels,  free air (though was not seen on her initial chest x-ray), or bowel dilatation.  However I do not believe she has an acute emergent medical issue and should be able to take over-the-counter medications and follow-up as an outpatient.  I explained this to her and she is comfortable with the plan.  I am also adding on LFTs and a lipase while the radiographs are pending.   Clinical Course as of May 18 1514  Thu May 17, 2016  1513 The patient just came out and said that she does not want the x-rays and that she would rather just go home and take some Colace.  I explained to her again why I was obtaining them, not because I thought she is constipated but because I wanted to rule out any obstructive patterns on her radiographs, but she says she understands but does not think that she is obstructed and will follow-up with Dr. Alva Garnet and her primary care doctor.  Given that she is in no acute distress with normal vital signs and having no vomiting and only very mild tenderness to palpation, I believe this is appropriate.  I gave my usual and customary return precautions.     [CF]    Clinical Course User Index [CF] Loleta Rose, MD    ____________________________________________  FINAL CLINICAL IMPRESSION(S) / ED DIAGNOSES  Final diagnoses:  Epigastric pain  Jaw pain  Left elbow pain  Left shoulder pain, unspecified chronicity     MEDICATIONS GIVEN DURING THIS VISIT:  Medications  ondansetron (ZOFRAN-ODT) disintegrating tablet 4 mg (4 mg Oral Given 05/17/16 1245)     NEW OUTPATIENT MEDICATIONS STARTED DURING THIS VISIT:  New Prescriptions   No medications on file    Modified Medications   No medications on file    Discontinued Medications   No medications on file     Note:  This document was prepared using Dragon voice recognition software and may include unintentional dictation errors.    Loleta Rose, MD 05/17/16 [phone]

## 2016-05-17 NOTE — Discharge Instructions (Signed)
As we discussed, your workup today was reassuring.  Though we do not know exactly what is causing your symptoms, it appears that you have no emergent medical condition at this time and that you are safe to go home and follow up as recommended in this paperwork. ° °Please return immediately to the Emergency Department if you develop any new or worsening symptoms that concern you. ° °

## 2016-05-18 NOTE — Discharge Summary (Signed)
Physician Discharge Summary  Patient ID: Sierra [name] MRN: [id number] DOB/AGE: 08-01-1980 36 y.o.  Admit date: 04/25/2016 Discharge date: 04/28/2016  Admission Diagnoses: Left ureteral calculus, sepsis Discharge Diagnoses:  Active Problems:   Left ureteral calculus   Discharged Condition: good  Hospital Course: The patient tolerated the procedure well and was transferred to the floor on IV pain meds, IV fluid. On POD#1 foley was removed, pt was started on clear liquid diet and they ambulated in the halls. On POD#2 the patient was transitioned to a regular diet, IVFs were discontinued, and the patient passed flatus. Prior to discharge the pt was tolerating a regular diet, pain was controlled on PO pain meds, they were ambulating without difficulty, and they had normal bowel function.   Consults: None  Significant Diagnostic Studies: urine culture  Treatments: surgery: left ureteral stent placement  Discharge Exam: Blood pressure (!) 102/51, pulse 84, temperature 98.1 F (36.7 C), temperature source Oral, resp. rate 19, height 5\' 4"  (1.626 m), weight (!) 162.1 kg (357 lb 6.4 oz), last menstrual period 04/01/2016, SpO2 100 %. General appearance: alert, cooperative and appears stated age Head: Normocephalic, without obvious abnormality, atraumatic Nose: Nares normal. Septum midline. Mucosa normal. No drainage or sinus tenderness. Back: symmetric, no curvature. ROM normal. No CVA tenderness. Resp: clear to auscultation bilaterally Cardio: regular rate and rhythm, S1, S2 normal, no murmur, click, rub or gallop GI: soft, non-tender; bowel sounds normal; no masses,  no organomegaly Extremities: extremities normal, atraumatic, no cyanosis or edema  Disposition: 01-Home or Self Care  Discharge Instructions    Discharge patient    Complete by:  As directed    Discharge disposition:  01-Home or Self Care   Discharge patient date:  04/28/2016     Allergies as of 04/28/2016   No Known  Allergies     Medication List    TAKE these medications   Biotin 10 MG Caps Take 5,000 mg by mouth daily.   lamoTRIgine 25 MG Chew chewable tablet Commonly known as:  LAMICTAL Chew 4 tablets (100 mg total) by mouth daily.   omeprazole 20 MG capsule Commonly known as:  PRILOSEC Take 20 mg by mouth 2 (two) times daily.   oxybutynin 5 MG tablet Commonly known as:  DITROPAN Take 1 tablet (5 mg total) by mouth every 8 (eight) hours as needed for bladder spasms.   sulfamethoxazole-trimethoprim 800-160 MG tablet Commonly known as:  BACTRIM DS,SEPTRA DS Take 1 tablet by mouth 2 (two) times daily.   tamsulosin 0.4 MG Caps capsule Commonly known as:  FLOMAX Take 1 capsule (0.4 mg total) by mouth daily.      Follow-up Information    Vanna ScotlandAshley Brandon, MD Follow up.   Specialty:  Urology Why:  will arrange for surgery Contact information: [address] [place] [phone]           Signed: Wilkie Ayeatrick McKenzie 05/18/2016, 3:23 PM

## 2016-05-22 LAB — STONE ANALYSIS
CA PHOS CRY STONE QL IR: 25 %
Ca Oxalate,Dihydrate: 10 %
Ca Oxalate,Monohydr.: 65 %
STONE WEIGHT KSTONE: 22.2 mg

## 2016-05-23 ENCOUNTER — Encounter: Payer: Self-pay | Admitting: Psychiatry

## 2016-05-23 ENCOUNTER — Ambulatory Visit (INDEPENDENT_AMBULATORY_CARE_PROVIDER_SITE_OTHER): Admitting: Psychiatry

## 2016-05-23 VITALS — BP 138/84 | Temp 98.0°F | Wt 334.8 lb

## 2016-05-23 DIAGNOSIS — F3160 Bipolar disorder, current episode mixed, unspecified: Secondary | ICD-10-CM

## 2016-05-23 DIAGNOSIS — F5081 Binge eating disorder: Secondary | ICD-10-CM

## 2016-05-23 MED ORDER — VENLAFAXINE HCL ER 75 MG PO CP24: 75.0000 mg | ORAL_CAPSULE | Freq: Every day | ORAL | 1 refills | Status: DC

## 2016-05-23 MED ORDER — LAMOTRIGINE 100 MG PO TABS: 100.0000 mg | ORAL_TABLET | Freq: Every day | ORAL | 1 refills | Status: DC

## 2016-05-23 NOTE — Progress Notes (Signed)
Psychiatric Follow Up Notes.   Patient Identification: Sierra [name] MRN:  [id number] Date of Evaluation:  05/23/2016 [facility] Chief Complaint:   Chief Complaint    Follow-up; Medication Refill     Visit Diagnosis:  Bipolar Disorder, Mixed.  Binge Eating Disorder   Diagnosis:   Patient Active Problem List   Diagnosis Date Noted  . Left ureteral calculus [N20.1] 04/25/2016  . Tingling in extremities [R20.2] 08/29/2015  . Acute nonintractable headache [R51] 08/29/2015  . Visual disturbance [H53.9] 08/23/2015  . Polydipsia [R63.1] 03/23/2014  . Binge eating disorder [F50.81] 03/23/2014  . Vaginitis and vulvovaginitis [N76.0] 12/31/2013  . Paronychia [IMO0002] 09/21/2013  . Essential hypertension, benign [I10] 06/05/2013  . Bilateral leg pain [M79.604, M79.605] 05/22/2013  . Right carpal tunnel syndrome [G56.01] 09/16/2012  . General counseling and advice on female contraception [Z30.09] 01/22/2012  . Anhydramnios [O41.00X0] 01/02/2012  . Umbilical cord complication [O69.9XX0] 12/07/2011  . High-risk pregnancy [O09.90] 11/09/2011  . Affective bipolar disorder (HCC) [F31.9] 10/12/2011  . H/O cesarean section [Z98.891] 10/12/2011  . Calculus of kidney [N20.0] 10/12/2011  . Obesity affecting pregnancy, antepartum [O99.210] 10/12/2011  . Bipolar affective disorder (HCC) [F31.9] 10/12/2011  . History of cesarean section [Z98.891] 10/12/2011  . SNORING [R06.09, R09.89] 07/23/2007  . EDEMA [R60.9] 06/11/2007  . MORBID OBESITY [E66.01] 12/23/2006  . BIPOLAR AFFECTIVE DISORDER [F31.9] 12/23/2006  . ANXIETY [F41.1] 12/23/2006  . DEPRESSION [F32.9] 12/23/2006  . GERD [K21.9] 12/23/2006  . MILD/UNSPEC PRE-ECLAMPSIA UNSPEC AS EPISODE CARE [IMO0002] 12/23/2006  . Fibromyalgia [M79.7] 12/23/2006   History of Present Illness:    Pt is a 36 year-old  obese female who presented for  follow-up appointment. She reported that He has lost 65 pounds after  her duodenal switch surgery in March. She reported that she is eating small meals and is able to tolerate her diet. She stated that she is talking to Dr. Alva Garnet and is following with him on a regular basis. Patient reported that she is able to eat solid foods but has to eat them in small meals and has to chop the food. Patient reported that she is now able to tolerate regular medications. Patient reported that she wants to start taking lamotrigine 100 mg. She also wants to start the Effexor XR as she reported that it was helpful. She feels depressed at this time. She does not feel that she has energy and she feels tired. She reported that she sleeps well at night. She reported that she does not feel motivated. She currently denied having any suicidal homicidal ideations or plans.  She reported that she was sick last month due to kidney stones and urinary tract infection. She stated that now she is drinking enough water. She appeared calm and alert during the interview. She currently denied having any use of drugs or alcohol.     Past Medical History:  Past Medical History:  Diagnosis Date  . Abdominal pain, other specified site   . Anxiety state, unspecified   . Bipolar disorder, unspecified (HCC)   . Depressive disorder, not elsewhere classified   . Edema   . Esophageal reflux   . Fibromyalgia   . Headache   . History of kidney stones   . Mild or unspecified pre-eclampsia, unspecified as to episode of care   . Morbid obesity (HCC)   . Myalgia and myositis, unspecified   . Other dyspnea and respiratory abnormality   . Preeclampsia    with first pregnancy  . Raynaud's  disease   . Urinary tract infection, site not specified     Past Surgical History:  Procedure Laterality Date  . CESAREAN SECTION     X 2  . CHOLECYSTECTOMY  03/2006  . CYSTOSCOPY W/ URETERAL STENT PLACEMENT Left 05/14/2016   Procedure: CYSTOSCOPY WITH STENT REPLACEMENT;  Surgeon: Vanna ScotlandAshley Brandon, MD;  Location: [facility];   Service: Urology;  Laterality: Left;  . CYSTOSCOPY WITH STENT PLACEMENT Left 04/25/2016   Procedure: CYSTOSCOPY WITH STENT PLACEMENT;  Surgeon: Vanna ScotlandAshley Brandon, MD;  Location: [facility];  Service: Urology;  Laterality: Left;  . DILATION AND CURETTAGE OF UTERUS    . IVC FILTER INSERTION  03/26/2016   [facility]  . LAPAROSCOPIC GASTRIC RESTRICTIVE DUODENAL PROCEDURE (DUODENAL SWITCH)  03/2016  . LITHOTRIPSY    . TONSILLECTOMY AND ADENOIDECTOMY    . URETEROSCOPY WITH HOLMIUM LASER LITHOTRIPSY Left 05/14/2016   Procedure: URETEROSCOPY WITH HOLMIUM LASER LITHOTRIPSY;  Surgeon: Vanna ScotlandAshley Brandon, MD;  Location: [facility];  Service: Urology;  Laterality: Left;   Social History:   Social History   Social History  . Marital status: Married    Spouse name: N/A  . Number of children: 1  . Years of education: N/A   Occupational History  . Home maker    Social History Main Topics  . Smoking status: Never Smoker  . Smokeless tobacco: Never Used  . Alcohol use 0.0 - 0.6 oz/week     Comment: occasional < 1 month  . Drug use: No  . Sexual activity: Yes    Partners: Male    Birth control/ protection: None, IUD   Other Topics Concern  . None   Social History Narrative   1 child, 2 step-sons      Regular exercise-no   Diet: no fast food, likes sweets   Additional Social History:  Married X 11 years. Has  Two sons ages  668 and 843.  Has 2 step sons ages 4023 and 6320. 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  Husband is a retired Administrator, [employer] and has PTSD.    Musculoskeletal: Strength & Muscle Tone: within normal limits Gait & Station: normal Patient leans: N/A  Psychiatric Specialty Exam: Review of Systems  Constitutional: Positive for malaise/fatigue and weight loss.  Eyes: Negative.   Respiratory: Negative.   Cardiovascular: Negative for palpitations and leg swelling.  Gastrointestinal: Negative.   Genitourinary: Negative.   Musculoskeletal: Negative for back pain,  joint pain and neck pain.  Skin: Negative for rash.  Neurological: Negative.   Psychiatric/Behavioral: Positive for depression. Negative for suicidal ideas. The patient is nervous/anxious. The patient does not have insomnia.     Blood pressure 138/84, temperature 98 F (36.7 C), temperature source Oral, weight (!) 334 lb 12.8 oz (151.9 kg), last menstrual period 04/28/2016.Body mass index is 57.47 kg/m.  General Appearance: Casual  Eye Contact:  Fair  Speech:  Clear and Coherent  Volume:  Normal  Mood:  Depressed  Affect:  Congruent  Thought Process:  Coherent  Orientation:  Full (Time, Place, and Person)  Thought Content:  WDL  Suicidal Thoughts:  No  Homicidal Thoughts:  No  Memory:  Immediate;   Fair  Judgement:  Fair  Insight:  Fair  Psychomotor Activity:  Normal  Concentration:  Fair  Recall:  [employer]Fair  Fund of Knowledge:Fair  Language: Fair  Akathisia:  No  Handed:  Right  AIMS (if indicated):    Assets:  Communication Skills Desire for  Improvement Social Support  ADL's:  Intact  Cognition: WNL  Sleep:  Difficulty going to sleep   Is the patient at risk to self?  No. Has the patient been a risk to self in the past 6 months?  No. Has the patient been a risk to self within the distant past?  No. Is the patient a risk to others?  No. Has the patient been a risk to others in the past 6 months?  No. Has the patient been a risk to others within the distant past?  No.  Allergies:  No Known Allergies Current Medications: Current Outpatient Prescriptions  Medication Sig Dispense Refill  . Biotin 10 MG CAPS Take 5,000 mg by mouth daily.     Marland Kitchen lamoTRIgine (LAMICTAL) 150 MG tablet Take 150 mg by mouth at bedtime.    . lamoTRIgine (LAMICTAL) 25 MG CHEW chewable tablet Chew 4 tablets (100 mg total) by mouth daily. 120 tablet 1  . Multiple Vitamin (MULTIVITAMIN) tablet Take 1 tablet by mouth daily.    Marland Kitchen omeprazole (PRILOSEC) 20 MG capsule Take 20 mg by mouth 2 (two) times  daily.    Marland Kitchen oxybutynin (DITROPAN) 5 MG tablet Take 1 tablet (5 mg total) by mouth every 8 (eight) hours as needed for bladder spasms. 30 tablet 0  . oxyCODONE-acetaminophen (PERCOCET/ROXICET) 5-325 MG tablet Take 1-2 tablets by mouth every 4 (four) hours as needed for moderate pain. 10 tablet 0  . sulfamethoxazole-trimethoprim (BACTRIM DS,SEPTRA DS) 800-160 MG tablet Take 1 tablet by mouth 2 (two) times daily. 28 tablet 0  . tamsulosin (FLOMAX) 0.4 MG CAPS capsule Take 1 capsule (0.4 mg total) by mouth daily. 30 capsule 0   No current facility-administered medications for this visit.    Facility-Administered Medications Ordered in Other Visits  Medication Dose Route Frequency Provider Last Rate Last Dose  . technetium sestamibi (CARDIOLITE) injection 30 millicurie  30 millicurie Intravenous Once PRN Register, Lavon Paganini., MD          Substance Abuse History in the last 12 months:  No.  Consequences of Substance Abuse: NA  Medical Decision Making:  Review of Psycho-Social Stressors (1) and Established Problem, Worsening (2)  Treatment Plan Summary: Medication management   I will start her on Effexor XR 75 mg in the morning.  Mood stabilization I will change the lamotrigine  100 mg daily  Discussed with patient about the side effects of medication. She agreed with the plan. She will follow-up in one month or earlier depending on her symptoms.  Discussed with her about the side effects of medications and she demonstrated understanding     More than 50% of the time spent in psychoeducation, counseling and coordination of care.     This note was generated in part or whole with voice recognition software. Voice regonition is usually quite accurate but there are transcription errors that can and very often do occur. I apologize for any typographical errors that were not detected and corrected.    Brandy Hale, MD  5/9/20188:47 AM

## 2016-06-12 ENCOUNTER — Ambulatory Visit
Admission: RE | Admit: 2016-06-12 | Discharge: 2016-06-12 | Disposition: A | Discharge status: Home / Self Care | Source: Ambulatory Visit | Attending: Urology | Admitting: Urology

## 2016-06-12 DIAGNOSIS — Z9889 Other specified postprocedural states: Secondary | ICD-10-CM | POA: Insufficient documentation

## 2016-06-12 DIAGNOSIS — N281 Cyst of kidney, acquired: Secondary | ICD-10-CM | POA: Diagnosis not present

## 2016-06-12 DIAGNOSIS — N201 Calculus of ureter: Secondary | ICD-10-CM | POA: Diagnosis present

## 2016-06-14 ENCOUNTER — Ambulatory Visit (INDEPENDENT_AMBULATORY_CARE_PROVIDER_SITE_OTHER): Admitting: Urology

## 2016-06-14 DIAGNOSIS — Z87442 Personal history of urinary calculi: Secondary | ICD-10-CM

## 2016-06-14 NOTE — Progress Notes (Signed)
06/14/2016 9:24 AM   Sierra [name] 1980-06-18 [id number]  Referring provider: Armando GangLindley, Cheryl P, FNP [address] [place], [place] [id number]  Chief Complaint  Patient presents with  . Nephrolithiasis    4wk w/Renal u/s    HPI: 36 year old female with an 11 mm left proximal obstructing ureteral calculus who underwent urgent left ureteral stent placement on 04/25/2016 in the setting of UTI/pyelonephritis. She remained in the hospital for several days postoperatively with fevers to 103. She ultimately grew Klebsiella pneumonia in her urine assistant to ampicillin and nitrofurantoin.  She returned to the operating room on 05/14/2016 for definitive management of her stone which was uncomplicated. Her stent was left on a string which she removed herself following the procedure.  Follow-up renal ultrasound 06/12/2016 shows interval resolution of her hydronephrosis as well as no left-sided renal stones.  She has no right-sided nonobstructing calculi.  She does have a personal history of kidney stones and has passed multiple stones spontaneously over the past several years. Her first stone was at age 36 requiring shockwave lithotripsy. She also has a strong family history of stones. 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  Stone analysis shows 10% calcium oxalate dihydrate, 65% calcium oxalate monohydrate, 25% calcium phosphate.  Today, she is doing well.  She has no flank pain, gross hematuria, or urinary issues.    PMH: Past Medical History:  Diagnosis Date  . Abdominal pain, other specified site   . Anxiety state, unspecified   . Bipolar disorder, unspecified (HCC)   . Depressive disorder, not elsewhere classified   . Edema   . Esophageal reflux   . Fibromyalgia   . Headache   . History of kidney stones   . Mild or unspecified pre-eclampsia, unspecified as to episode of care   . Morbid obesity (HCC)   . Myalgia and myositis,  unspecified   . Other dyspnea and respiratory abnormality   . Preeclampsia    with first pregnancy  . Raynaud's disease   . Urinary tract infection, site not specified     Surgical History: Past Surgical History:  Procedure Laterality Date  . CESAREAN SECTION     X 2  . CHOLECYSTECTOMY  03/2006  . CYSTOSCOPY W/ URETERAL STENT PLACEMENT Left 05/14/2016   Procedure: CYSTOSCOPY WITH STENT REPLACEMENT;  Surgeon: Vanna ScotlandAshley Mickell Birdwell, MD;  Location: [facility];  Service: Urology;  Laterality: Left;  . CYSTOSCOPY WITH STENT PLACEMENT Left 04/25/2016   Procedure: CYSTOSCOPY WITH STENT PLACEMENT;  Surgeon: Vanna ScotlandAshley Shalom Mcguiness, MD;  Location: [facility];  Service: Urology;  Laterality: Left;  . DILATION AND CURETTAGE OF UTERUS    . IVC FILTER INSERTION  03/26/2016   [facility]  . LAPAROSCOPIC GASTRIC RESTRICTIVE DUODENAL PROCEDURE (DUODENAL SWITCH)  03/2016  . LITHOTRIPSY    . TONSILLECTOMY AND ADENOIDECTOMY    . URETEROSCOPY WITH HOLMIUM LASER LITHOTRIPSY Left 05/14/2016   Procedure: URETEROSCOPY WITH HOLMIUM LASER LITHOTRIPSY;  Surgeon: Vanna ScotlandAshley Arnie Maiolo, MD;  Location: [facility];  Service: Urology;  Laterality: Left;    Home Medications:  Allergies as of 06/14/2016   No Known Allergies     Medication List       Accurate as of 06/14/16  9:24 AM. Always use your most recent med list.          Biotin 10 MG Caps Take 5,000 mg by mouth daily.   lamoTRIgine 100 MG tablet Commonly known as:  LAMICTAL Take 1 tablet (100 mg total) by mouth daily.  multivitamin tablet Take 1 tablet by mouth daily.   omeprazole 20 MG capsule Commonly known as:  PRILOSEC Take 20 mg by mouth 2 (two) times daily.   venlafaxine XR 75 MG 24 hr capsule Commonly known as:  EFFEXOR XR Take 1 capsule (75 mg total) by mouth daily with breakfast.       Allergies: No Known Allergies  Family History: Family History  Problem Relation Age of Onset  . Depression Father   . Alcohol abuse Father   . Depression Mother     . Hyperlipidemia Mother   . Sleep apnea Mother   . Depression Sister   . Hyperlipidemia Brother   . Depression Brother   . Hyperlipidemia Brother   . Depression Brother   . Alcohol abuse Brother   . Coronary artery disease Maternal Grandmother   . Heart attack Maternal Grandmother   . Diabetes Maternal Grandmother   . Lung cancer Maternal Grandfather   . Emphysema Maternal Grandfather   . Coronary artery disease Maternal Grandfather   . Lupus Unknown        Aunt  . Fibromyalgia Unknown        Aunt    Social History:  reports that she has never smoked. She has never used smokeless tobacco. She reports that she drinks alcohol. She reports that she does not use drugs.  ROS: UROLOGY Frequent Urination?: No Hard to postpone urination?: No Burning/pain with urination?: No Get up at night to urinate?: No Leakage of urine?: No Urine stream starts and stops?: No Trouble starting stream?: No Do you have to strain to urinate?: No Blood in urine?: No Urinary tract infection?: No Sexually transmitted disease?: No Injury to kidneys or bladder?: No Painful intercourse?: No Weak stream?: No Currently pregnant?: No Vaginal bleeding?: No Last menstrual period?: n  Gastrointestinal Nausea?: No Vomiting?: No Indigestion/heartburn?: No Diarrhea?: No Constipation?: No  Constitutional Fever: No Night sweats?: No Weight loss?: No Fatigue?: No  Skin Skin rash/lesions?: No Itching?: No  Eyes Blurred vision?: No Double vision?: No  Ears/Nose/Throat Sore throat?: No Sinus problems?: No  Hematologic/Lymphatic Swollen glands?: No Easy bruising?: No  Cardiovascular Leg swelling?: No Chest pain?: No  Respiratory Cough?: No Shortness of breath?: No  Endocrine Excessive thirst?: No  Musculoskeletal Back pain?: No Joint pain?: No  Neurological Headaches?: No Dizziness?: No  Psychologic Depression?: Yes Anxiety?: Yes  Physical Exam: There were no vitals  taken for this visit.  Constitutional:  Alert and oriented, No acute distress. HEENT: [place] AT, moist mucus membranes.  Trachea midline, no masses. Cardiovascular: No clubbing, cyanosis, or edema. Respiratory: Normal respiratory effort, no increased work of breathing. GI: Abdomen is soft, nontender, nondistended, no abdominal masses GU: No CVA tenderness.  Skin: No rashes, bruises or suspicious lesions. Lymph: No cervical or inguinal adenopathy. Neurologic: Grossly intact, no focal deficits, moving all 4 extremities. Psychiatric: Normal mood and affect.  Laboratory Data: Lab Results  Component Value Date   WBC 4.3 05/17/2016   HGB 13.5 05/17/2016   HCT 40.5 05/17/2016   MCV 86.7 05/17/2016   PLT 239 05/17/2016    Lab Results  Component Value Date   CREATININE 0.82 05/17/2016     Lab Results  Component Value Date   HGBA1C 5.0 03/23/2014    Urinalysis N/a  Pertinent Imaging: CLINICAL DATA:  Left ureteral stone demonstrated in April on CT scan with subsequent lithotripsy on May 14, 2016.  EXAM: RENAL / URINARY TRACT ULTRASOUND COMPLETE  COMPARISON:  Noncontrast CT scan  dated April 25, 2016  FINDINGS: Right Kidney:  Length: 11.5 cm. The renal cortical echotexture remains lower than that of the liver. There is a 1.1 x 0.7 x 1.0 cm lower pole simple appearing cyst. There is no hydronephrosis.  Left Kidney:  Length: 11.7 cm. Echogenicity within normal limits. No mass or hydronephrosis visualized.  Bladder:  The partially distended urinary bladder is unremarkable.  IMPRESSION: Interval resolution of left-sided hydronephrosis. No left-sided stones are observed.  No hydronephrosis on the right. There is a 1.1 cm diameter simple appearing lower pole cyst on the right.  The partially distended urinary bladder is unremarkable.   Electronically Signed   By: David  [place] M.D.   On: 06/12/2016 10:55  Renal ultrasound personally reviewed  today.  Assessment & Plan:    1. History of kidney stones Status post successful left ureteroscopy  We discussed general stone prevention techniques including drinking plenty water with goal of producing 2.5 L urine daily, increased citric acid intake, avoidance of high oxalate containing foods, and decreased salt intake.  Information about dietary recommendations given today.   Offered 24 hour urine, declined at this time  Warning symptoms reviewed  Recommend f/u with KUB in 6 months  Return in about 6 months (around 12/14/2016) for KUB.  [name], MD  [facility] [address], [address] [place], [place] [phone]

## 2016-07-02 ENCOUNTER — Encounter: Payer: Self-pay | Admitting: Psychiatry

## 2016-07-02 ENCOUNTER — Ambulatory Visit (INDEPENDENT_AMBULATORY_CARE_PROVIDER_SITE_OTHER): Admitting: Psychiatry

## 2016-07-02 VITALS — Temp 98.9°F | Wt 312.4 lb

## 2016-07-02 DIAGNOSIS — F3160 Bipolar disorder, current episode mixed, unspecified: Secondary | ICD-10-CM

## 2016-07-02 MED ORDER — MELATONIN 10 MG PO TABS: 10.0000 mg | ORAL_TABLET | Freq: Every day | ORAL | 1 refills | Status: DC | PRN

## 2016-07-02 MED ORDER — ARIPIPRAZOLE 5 MG PO TABS: 5.0000 mg | ORAL_TABLET | Freq: Every day | ORAL | 1 refills | Status: DC

## 2016-07-02 MED ORDER — VENLAFAXINE HCL ER 75 MG PO CP24: 75.0000 mg | ORAL_CAPSULE | Freq: Every day | ORAL | 1 refills | Status: DC

## 2016-07-02 MED ORDER — ESCITALOPRAM OXALATE 10 MG PO TABS: 10.0000 mg | ORAL_TABLET | Freq: Every day | ORAL | 1 refills | Status: DC

## 2016-07-02 NOTE — Progress Notes (Signed)
Psychiatric Follow Up Notes.   Patient Identification: Sierra [name] MRN:  [id number] Date of Evaluation:  07/02/2016 [facility] Chief Complaint:   Chief Complaint    Follow-up; Medication Refill     Visit Diagnosis:  Bipolar Disorder, Mixed.  Binge Eating Disorder   Diagnosis:   Patient Active Problem List   Diagnosis Date Noted  . Left ureteral calculus [N20.1] 04/25/2016  . Tingling in extremities [R20.2] 08/29/2015  . Acute nonintractable headache [R51] 08/29/2015  . Visual disturbance [H53.9] 08/23/2015  . Polydipsia [R63.1] 03/23/2014  . Binge eating disorder [F50.81] 03/23/2014  . Vaginitis and vulvovaginitis [N76.0] 12/31/2013  . Paronychia [IMO0002] 09/21/2013  . Essential hypertension, benign [I10] 06/05/2013  . Bilateral leg pain [M79.604, M79.605] 05/22/2013  . Right carpal tunnel syndrome [G56.01] 09/16/2012  . General counseling and advice on female contraception [Z30.09] 01/22/2012  . Anhydramnios [O41.00X0] 01/02/2012  . Umbilical cord complication [O69.9XX0] 12/07/2011  . High-risk pregnancy [O09.90] 11/09/2011  . Affective bipolar disorder (HCC) [F31.9] 10/12/2011  . H/O cesarean section [Z98.891] 10/12/2011  . Calculus of kidney [N20.0] 10/12/2011  . Obesity affecting pregnancy, antepartum [O99.210] 10/12/2011  . Bipolar affective disorder (HCC) [F31.9] 10/12/2011  . History of cesarean section [Z98.891] 10/12/2011  . SNORING [R06.09, R09.89] 07/23/2007  . EDEMA [R60.9] 06/11/2007  . MORBID OBESITY [E66.01] 12/23/2006  . BIPOLAR AFFECTIVE DISORDER [F31.9] 12/23/2006  . ANXIETY [F41.1] 12/23/2006  . DEPRESSION [F32.9] 12/23/2006  . GERD [K21.9] 12/23/2006  . MILD/UNSPEC PRE-ECLAMPSIA UNSPEC AS EPISODE CARE [IMO0002] 12/23/2006  . Fibromyalgia [M79.7] 12/23/2006   History of Present Illness:    Pt is a 36 year-old   female who presented for  follow-up appointment. She reported that she has lost 65 pounds after her  duodenal switch surgery in March. She reported She has been becoming very depressed and anxious and was at the beach with her family last week. She reported that she started having suicidal ideations at that time as she was very apprehensive. She was having negative thoughts about her family members. She reported that her husband helped her and she came back home. She reported that she is not having any suicidal ideations at this time. She stated that she continues to have negative thoughts and is becoming delusional that everything is going to go wrong. She is unable to eat at this time. She is eating small meals and is trying to eat more proteins. She is also taking her proteins. Patient reported that her "brain is running fast all the time "and she is unable to control herself. She sleeps only 3 hours at night. She appeared apprehensive during the interview. She wants her medications to be adjusted as she feels that Effexor is not helping her. She currently denied having any suicidal homicidal ideations or plans. We discussed about her medications in detail. She stated that she is not happy about her weight loss.  Her family remains supportive.     Past Medical History:  Past Medical History:  Diagnosis Date  . Abdominal pain, other specified site   . Anxiety state, unspecified   . Bipolar disorder, unspecified (HCC)   . Depressive disorder, not elsewhere classified   . Edema   . Esophageal reflux   . Fibromyalgia   . Headache   . History of kidney stones   . Mild or unspecified pre-eclampsia, unspecified as to episode of care   . Morbid obesity (HCC)   . Myalgia and myositis, unspecified   . Other dyspnea and respiratory  abnormality   . Preeclampsia    with first pregnancy  . Raynaud's disease   . Urinary tract infection, site not specified     Past Surgical History:  Procedure Laterality Date  . CESAREAN SECTION     X 2  . CHOLECYSTECTOMY  03/2006  . CYSTOSCOPY W/ URETERAL STENT  PLACEMENT Left 05/14/2016   Procedure: CYSTOSCOPY WITH STENT REPLACEMENT;  Surgeon: [name], MD;  Location: [facility];  Service: Urology;  Laterality: Left;  . CYSTOSCOPY WITH STENT PLACEMENT Left 04/25/2016   Procedure: CYSTOSCOPY WITH STENT PLACEMENT;  Surgeon: [name], MD;  Location: [facility];  Service: Urology;  Laterality: Left;  . DILATION AND CURETTAGE OF UTERUS    . IVC FILTER INSERTION  03/26/2016   [facility]  . LAPAROSCOPIC GASTRIC RESTRICTIVE DUODENAL PROCEDURE (DUODENAL SWITCH)  03/2016  . LITHOTRIPSY    . TONSILLECTOMY AND ADENOIDECTOMY    . URETEROSCOPY WITH HOLMIUM LASER LITHOTRIPSY Left 05/14/2016   Procedure: URETEROSCOPY WITH HOLMIUM LASER LITHOTRIPSY;  Surgeon: [name], MD;  Location: [facility];  Service: Urology;  Laterality: Left;   Social History:   Social History   Social History  . Marital status: Married    Spouse name: N/A  . Number of children: 1  . Years of education: N/A   Occupational History  . Home maker    Social History Main Topics  . Smoking status: Never Smoker  . Smokeless tobacco: Never Used  . Alcohol use 0.0 - 0.6 oz/week     Comment: occasional < 1 month  . Drug use: No  . Sexual activity: Yes    Partners: Male    Birth control/ protection: None, IUD   Other Topics Concern  . None   Social History Narrative   1 child, 2 step-sons      Regular exercise-no   Diet: no fast food, likes sweets   Additional Social History:  Married X 11 years. Has  Two sons ages  28 and 60.  Has 2 step sons ages 47 and 75. 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  Husband is a retired Administrator, [employer] and has PTSD.    Musculoskeletal: Strength & Muscle Tone: within normal limits Gait & Station: normal Patient leans: N/A  Psychiatric Specialty Exam: Review of Systems  Constitutional: Positive for malaise/fatigue and weight loss.  Eyes: Negative.   Respiratory: Negative.   Cardiovascular: Negative for  palpitations and leg swelling.  Gastrointestinal: Negative.   Genitourinary: Negative.   Musculoskeletal: Negative for back pain, joint pain and neck pain.  Skin: Negative for rash.  Neurological: Negative.   Psychiatric/Behavioral: Positive for depression. Negative for suicidal ideas. The patient is nervous/anxious. The patient does not have insomnia.     Temperature 98.9 F (37.2 C), temperature source Oral, weight (!) 312 lb 6.4 oz (141.7 kg).Body mass index is 53.62 kg/m.  General Appearance: Casual  Eye Contact:  Fair  Speech:  Clear and Coherent  Volume:  Normal  Mood:  Depressed  Affect:  Congruent  Thought Process:  Coherent  Orientation:  Full (Time, Place, and Person)  Thought Content:  WDL  Suicidal Thoughts:  No  Homicidal Thoughts:  No  Memory:  Immediate;   Fair  Judgement:  Fair  Insight:  Fair  Psychomotor Activity:  Normal  Concentration:  Fair  Recall:  [employer] of Knowledge:Fair  Language: Fair  Akathisia:  No  Handed:  Right  AIMS (if indicated):  Assets:  Communication Skills Desire for Improvement Social Support  ADL's:  Intact  Cognition: WNL  Sleep:  Difficulty going to sleep   Is the patient at risk to self?  No. Has the patient been a risk to self in the past 6 months?  No. Has the patient been a risk to self within the distant past?  No. Is the patient a risk to others?  No. Has the patient been a risk to others in the past 6 months?  No. Has the patient been a risk to others within the distant past?  No.  Allergies:  No Known Allergies Current Medications: Current Outpatient Prescriptions  Medication Sig Dispense Refill  . Biotin 10 MG CAPS Take 5,000 mg by mouth daily.     Marland Kitchen lamoTRIgine (LAMICTAL) 100 MG tablet Take 1 tablet (100 mg total) by mouth daily. 90 tablet 1  . Multiple Vitamin (MULTIVITAMIN) tablet Take 1 tablet by mouth daily.    Marland Kitchen omeprazole (PRILOSEC) 20 MG capsule Take 20 mg by mouth 2 (two) times daily.    Marland Kitchen  venlafaxine XR (EFFEXOR XR) 75 MG 24 hr capsule Take 1 capsule (75 mg total) by mouth daily with breakfast. Take 1 cap on alternate days x 1 week then stop. 30 capsule 1  . ARIPiprazole (ABILIFY) 5 MG tablet Take 1 tablet (5 mg total) by mouth daily. 30 tablet 1  . escitalopram (LEXAPRO) 10 MG tablet Take 1 tablet (10 mg total) by mouth daily. 30 tablet 1  . Melatonin 10 MG TABS Take 10 mg by mouth daily as needed. otc 30 tablet 1   No current facility-administered medications for this visit.       Substance Abuse History in the last 12 months:  No.  Consequences of Substance Abuse: NA  Medical Decision Making:  Review of Psycho-Social Stressors (1) and Established Problem, Worsening (2)  Treatment Plan Summary: Medication management   I will start her on Lexapro 10 mg daily. She will decrease the dose of Effexor XR 75 mg on alternate days and will gradually stop it in one week.  I will also start her on Abilify 5  mg daily   Continue lamotrigine  100 mg daily  She will take melatonin at bedtime to help her sleep  Discussed with patient about the side effects of medication. She agreed with the plan. She will follow-up in 2 weeks or earlier depending on her symptoms.  Discussed with her about the side effects of medications and she demonstrated understanding     More than 50% of the time spent in psychoeducation, counseling and coordination of care.     This note was generated in part or whole with voice recognition software. Voice regonition is usually quite accurate but there are transcription errors that can and very often do occur. I apologize for any typographical errors that were not detected and corrected.    Brandy Hale, MD  6/18/20189:24 AM

## 2016-07-11 ENCOUNTER — Encounter: Payer: Self-pay | Admitting: Psychiatry

## 2016-07-11 ENCOUNTER — Ambulatory Visit (INDEPENDENT_AMBULATORY_CARE_PROVIDER_SITE_OTHER): Admitting: Psychiatry

## 2016-07-11 VITALS — BP 123/78 | HR 92 | Temp 98.7°F | Wt 314.2 lb

## 2016-07-11 DIAGNOSIS — F3160 Bipolar disorder, current episode mixed, unspecified: Secondary | ICD-10-CM | POA: Diagnosis not present

## 2016-07-11 DIAGNOSIS — F5081 Binge eating disorder: Secondary | ICD-10-CM

## 2016-07-11 MED ORDER — ESCITALOPRAM OXALATE 10 MG PO TABS: 10.0000 mg | ORAL_TABLET | Freq: Every day | ORAL | 1 refills | Status: DC

## 2016-07-11 MED ORDER — ARIPIPRAZOLE 5 MG PO TABS: 5.0000 mg | ORAL_TABLET | Freq: Every day | ORAL | 1 refills | Status: DC

## 2016-07-11 MED ORDER — LAMOTRIGINE 100 MG PO TABS: 100.0000 mg | ORAL_TABLET | Freq: Every day | ORAL | 1 refills | Status: DC

## 2016-07-11 NOTE — Progress Notes (Signed)
Psychiatric Follow Up Notes.   Patient Identification: Sierra [name] MRN:  [id number] Date of Evaluation:  07/11/2016 [facility]  Chief Complaint:   Chief Complaint    Follow-up; Medication Refill     Visit Diagnosis:  Bipolar Disorder, Mixed.  Binge Eating Disorder   Diagnosis:   Patient Active Problem List   Diagnosis Date Noted  . Left ureteral calculus [N20.1] 04/25/2016  . Tingling in extremities [R20.2] 08/29/2015  . Acute nonintractable headache [R51] 08/29/2015  . Visual disturbance [H53.9] 08/23/2015  . Polydipsia [R63.1] 03/23/2014  . Binge eating disorder [F50.81] 03/23/2014  . Vaginitis and vulvovaginitis [N76.0] 12/31/2013  . Paronychia [IMO0002] 09/21/2013  . Essential hypertension, benign [I10] 06/05/2013  . Bilateral leg pain [M79.604, M79.605] 05/22/2013  . Right carpal tunnel syndrome [G56.01] 09/16/2012  . General counseling and advice on female contraception [Z30.09] 01/22/2012  . Anhydramnios [O41.00X0] 01/02/2012  . Umbilical cord complication [O69.9XX0] 12/07/2011  . High-risk pregnancy [O09.90] 11/09/2011  . Affective bipolar disorder (HCC) [F31.9] 10/12/2011  . H/O cesarean section [Z98.891] 10/12/2011  . Calculus of kidney [N20.0] 10/12/2011  . Obesity affecting pregnancy, antepartum [O99.210] 10/12/2011  . Bipolar affective disorder (HCC) [F31.9] 10/12/2011  . History of cesarean section [Z98.891] 10/12/2011  . SNORING [R06.09, R09.89] 07/23/2007  . EDEMA [R60.9] 06/11/2007  . MORBID OBESITY [E66.01] 12/23/2006  . BIPOLAR AFFECTIVE DISORDER [F31.9] 12/23/2006  . ANXIETY [F41.1] 12/23/2006  . DEPRESSION [F32.9] 12/23/2006  . GERD [K21.9] 12/23/2006  . MILD/UNSPEC PRE-ECLAMPSIA UNSPEC AS EPISODE CARE [IMO0002] 12/23/2006  . Fibromyalgia [M79.7] 12/23/2006   History of Present Illness:    Pt is a 36 year-old   female who presented for  follow-up appointment. She reported that she has lost 65 pounds after her  duodenal switch surgery in March. She reported that she has just started feeling much better after her medications are adjusted at her last visit. She was happy and smiling during the interview. She reported that her husband was also thankful and he has felt improvement in her symptoms. She reported that she has stopped the Effexor. She is not taking the Lexapro and Abilify in the morning. She is able to sleep well with the help of melatonin. She reported that since she was depressed for a long period of time she has started regaining her strength and energy back and she is planning to clean her house. She reported that  she is able to eat small portions of  food.  She currently denied having any suicidal homicidal ideations or plans. She was laughing appropriately.  Her family remains supportive.     Past Medical History:  Past Medical History:  Diagnosis Date  . Abdominal pain, other specified site   . Anxiety state, unspecified   . Bipolar disorder, unspecified (HCC)   . Depressive disorder, not elsewhere classified   . Edema   . Esophageal reflux   . Fibromyalgia   . Headache   . History of kidney stones   . Mild or unspecified pre-eclampsia, unspecified as to episode of care   . Morbid obesity (HCC)   . Myalgia and myositis, unspecified   . Other dyspnea and respiratory abnormality   . Preeclampsia    with first pregnancy  . Raynaud's disease   . Urinary tract infection, site not specified     Past Surgical History:  Procedure Laterality Date  . CESAREAN SECTION     X 2  . CHOLECYSTECTOMY  03/2006  . CYSTOSCOPY W/ URETERAL STENT PLACEMENT Left 05/14/2016  Procedure: CYSTOSCOPY WITH STENT REPLACEMENT;  Surgeon: [name], MD;  Location: [facility];  Service: Urology;  Laterality: Left;  . CYSTOSCOPY WITH STENT PLACEMENT Left 04/25/2016   Procedure: CYSTOSCOPY WITH STENT PLACEMENT;  Surgeon: [name], MD;  Location: [facility];  Service: Urology;  Laterality: Left;  .  DILATION AND CURETTAGE OF UTERUS    . IVC FILTER INSERTION  03/26/2016   [facility]  . LAPAROSCOPIC GASTRIC RESTRICTIVE DUODENAL PROCEDURE (DUODENAL SWITCH)  03/2016  . LITHOTRIPSY    . TONSILLECTOMY AND ADENOIDECTOMY    . URETEROSCOPY WITH HOLMIUM LASER LITHOTRIPSY Left 05/14/2016   Procedure: URETEROSCOPY WITH HOLMIUM LASER LITHOTRIPSY;  Surgeon: [name], MD;  Location: [facility];  Service: Urology;  Laterality: Left;   Social History:   Social History   Social History  . Marital status: Married    Spouse name: N/A  . Number of children: 1  . Years of education: N/A   Occupational History  . Home maker    Social History Main Topics  . Smoking status: Never Smoker  . Smokeless tobacco: Never Used  . Alcohol use 0.0 - 0.6 oz/week     Comment: occasional < 1 month  . Drug use: No  . Sexual activity: Yes    Partners: Male    Birth control/ protection: None, IUD   Other Topics Concern  . None   Social History Narrative   1 child, 2 step-sons      Regular exercise-no   Diet: no fast food, likes sweets   Additional Social History:  Married X 11 years. Has  Two sons ages  55 and 56.  Has 2 step sons ages 28 and 5. 19 yo is in Hotel manager and is married. 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  Husband is a retired Administrator, [employer] and has PTSD.    Musculoskeletal: Strength & Muscle Tone: within normal limits Gait & Station: normal Patient leans: N/A  Psychiatric Specialty Exam: Review of Systems  Constitutional: Positive for malaise/fatigue and weight loss.  Eyes: Negative.   Respiratory: Negative.   Cardiovascular: Negative for palpitations and leg swelling.  Gastrointestinal: Negative.   Genitourinary: Negative.   Musculoskeletal: Negative for back pain, joint pain and neck pain.  Skin: Negative for rash.  Neurological: Negative.   Psychiatric/Behavioral: Positive for depression. Negative for suicidal ideas. The patient is nervous/anxious. The patient does not have insomnia.      Blood pressure 123/78, pulse 92, temperature 98.7 F (37.1 C), temperature source Oral, weight (!) 314 lb 3.2 oz (142.5 kg).Body mass index is 53.93 kg/m.  General Appearance: Casual  Eye Contact:  Fair  Speech:  Clear and Coherent  Volume:  Normal  Mood:  Depressed  Affect:  Congruent  Thought Process:  Coherent  Orientation:  Full (Time, Place, and Person)  Thought Content:  WDL  Suicidal Thoughts:  No  Homicidal Thoughts:  No  Memory:  Immediate;   Fair  Judgement:  Fair  Insight:  Fair  Psychomotor Activity:  Normal  Concentration:  Fair  Recall:  [employer] of Knowledge:Fair  Language: Fair  Akathisia:  No  Handed:  Right  AIMS (if indicated):    Assets:  Communication Skills Desire for Improvement Social Support  ADL's:  Intact  Cognition: WNL  Sleep:  Difficulty going to sleep   Is the patient at risk to self?  No. Has the patient been a risk to self in the past 6 months?  No. Has the patient been a risk  to self within the distant past?  No. Is the patient a risk to others?  No. Has the patient been a risk to others in the past 6 months?  No. Has the patient been a risk to others within the distant past?  No.  Allergies:  No Known Allergies Current Medications: Current Outpatient Prescriptions  Medication Sig Dispense Refill  . ARIPiprazole (ABILIFY) 5 MG tablet Take 1 tablet (5 mg total) by mouth daily. 30 tablet 1  . Biotin 10 MG CAPS Take 5,000 mg by mouth daily.     Marland Kitchen escitalopram (LEXAPRO) 10 MG tablet Take 1 tablet (10 mg total) by mouth daily. 30 tablet 1  . lamoTRIgine (LAMICTAL) 100 MG tablet Take 1 tablet (100 mg total) by mouth daily. 90 tablet 1  . Melatonin 10 MG TABS Take 10 mg by mouth daily as needed. otc 30 tablet 1  . Multiple Vitamin (MULTIVITAMIN) tablet Take 1 tablet by mouth daily.    Marland Kitchen omeprazole (PRILOSEC) 20 MG capsule Take 20 mg by mouth 2 (two) times daily.    Marland Kitchen venlafaxine XR (EFFEXOR XR) 75 MG 24 hr capsule Take 1 capsule (75 mg  total) by mouth daily with breakfast. Take 1 cap on alternate days x 1 week then stop. 30 capsule 1   No current facility-administered medications for this visit.       Substance Abuse History in the last 12 months:  No.  Consequences of Substance Abuse: NA  Medical Decision Making:  Review of Psycho-Social Stressors (1) and Established Problem, Worsening (2)  Treatment Plan Summary: Medication management   Continue  Lexapro 10 mg daily.   Continue  Abilify 5  mg daily  Continue lamotrigine  100 mg daily  She will take melatonin at bedtime to help her sleep.  Three-month supply of the medications were sent to the mail order pharmacy.  Discussed with patient about the side effects of medication. She agreed with the plan.   She will follow-up in 2 months  or earlier depending on her symptoms.  Discussed with her about the side effects of medications and she demonstrated understanding     More than 50% of the time spent in psychoeducation, counseling and coordination of care.     This note was generated in part or whole with voice recognition software. Voice regonition is usually quite accurate but there are transcription errors that can and very often do occur. I apologize for any typographical errors that were not detected and corrected.    Brandy Hale, MD  6/27/201811:26 AM

## 2016-07-17 ENCOUNTER — Ambulatory Visit: Admitting: Psychiatry

## 2016-08-17 ENCOUNTER — Ambulatory Visit: Payer: Self-pay | Admitting: Obstetrics & Gynecology

## 2016-09-10 ENCOUNTER — Encounter: Payer: Self-pay | Admitting: Psychiatry

## 2016-09-10 ENCOUNTER — Other Ambulatory Visit: Payer: Self-pay

## 2016-09-10 ENCOUNTER — Ambulatory Visit (INDEPENDENT_AMBULATORY_CARE_PROVIDER_SITE_OTHER): Admitting: Psychiatry

## 2016-09-10 VITALS — BP 115/72 | HR 71 | Temp 99.1°F | Wt 310.4 lb

## 2016-09-10 DIAGNOSIS — F3160 Bipolar disorder, current episode mixed, unspecified: Secondary | ICD-10-CM

## 2016-09-10 MED ORDER — ARIPIPRAZOLE 5 MG PO TABS: 5.0000 mg | ORAL_TABLET | Freq: Every day | ORAL | 1 refills | Status: DC

## 2016-09-10 NOTE — Progress Notes (Signed)
Psychiatric Follow Up Notes.   Patient Identification: Sierra [name] MRN:  [id number] Date of Evaluation:  09/10/2016 [facility] Chief Complaint:   Chief Complaint    Follow-up; Medication Refill     Visit Diagnosis:  Bipolar Disorder, Mixed.  Binge Eating Disorder   Diagnosis:   Patient Active Problem List   Diagnosis Date Noted  . Left ureteral calculus [N20.1] 04/25/2016  . Tingling in extremities [R20.2] 08/29/2015  . Acute nonintractable headache [R51] 08/29/2015  . Visual disturbance [H53.9] 08/23/2015  . Polydipsia [R63.1] 03/23/2014  . Binge eating disorder [F50.81] 03/23/2014  . Vaginitis and vulvovaginitis [N76.0] 12/31/2013  . Paronychia [IMO0002] 09/21/2013  . Essential hypertension, benign [I10] 06/05/2013  . Bilateral leg pain [M79.604, M79.605] 05/22/2013  . Right carpal tunnel syndrome [G56.01] 09/16/2012  . General counseling and advice on female contraception [Z30.09] 01/22/2012  . Anhydramnios [O41.00X0] 01/02/2012  . Umbilical cord complication [O69.9XX0] 12/07/2011  . High-risk pregnancy [O09.90] 11/09/2011  . Affective bipolar disorder (HCC) [F31.9] 10/12/2011  . H/O cesarean section [Z98.891] 10/12/2011  . Calculus of kidney [N20.0] 10/12/2011  . Obesity affecting pregnancy, antepartum [O99.210] 10/12/2011  . Bipolar affective disorder (HCC) [F31.9] 10/12/2011  . History of cesarean section [Z98.891] 10/12/2011  . SNORING [R06.09, R09.89] 07/23/2007  . EDEMA [R60.9] 06/11/2007  . MORBID OBESITY [E66.01] 12/23/2006  . BIPOLAR AFFECTIVE DISORDER [F31.9] 12/23/2006  . ANXIETY [F41.1] 12/23/2006  . DEPRESSION [F32.9] 12/23/2006  . GERD [K21.9] 12/23/2006  . MILD/UNSPEC PRE-ECLAMPSIA UNSPEC AS EPISODE CARE [IMO0002] 12/23/2006  . Fibromyalgia [M79.7] 12/23/2006   History of Present Illness:    Pt is a 36 year-old   female who presented for  follow-up appointment. She reported that she has noticed that she is  becoming manic in the past month. She wakes up after 3-5 hours of sleep. She has more energy and she has been cooking freezer foods. She reported that there are 2 people who are pregnant and she has been cooking for them. She also has noticed some spending sprees and she has given her credit card to her husband. Patient reported that she is concerned about her behavior. We discussed about her medications. She reported that she does not want to become depressed again. She has also seen Dr. Alva Garnet recently and they have noticed that she has not lost any weight in the past few months. They have asked her to discuss with the plastic surgeon about the excessive skin. She is stalling and will make an appointment soon. She reported that she wants to lose more weight before seeing a Engineer, petroleum. She appeared calm and alert during the interview. She reported that she has been worried about her manic symptoms although she does not appear hyper or active during the interview.     She currently denied having any suicidal homicidal ideations or plans. She was laughing appropriately.  Her family remains supportive.     Past Medical History:  Past Medical History:  Diagnosis Date  . Abdominal pain, other specified site   . Anxiety state, unspecified   . Bipolar disorder, unspecified (HCC)   . Depressive disorder, not elsewhere classified   . Edema   . Esophageal reflux   . Fibromyalgia   . Headache   . History of kidney stones   . Mild or unspecified pre-eclampsia, unspecified as to episode of care   . Morbid obesity (HCC)   . Myalgia and myositis, unspecified   . Other dyspnea and respiratory abnormality   . Preeclampsia  with first pregnancy  . Raynaud's disease   . Urinary tract infection, site not specified     Past Surgical History:  Procedure Laterality Date  . CESAREAN SECTION     X 2  . CHOLECYSTECTOMY  03/2006  . CYSTOSCOPY W/ URETERAL STENT PLACEMENT Left 05/14/2016   Procedure:  CYSTOSCOPY WITH STENT REPLACEMENT;  Surgeon: [name], MD;  Location: [facility];  Service: Urology;  Laterality: Left;  . CYSTOSCOPY WITH STENT PLACEMENT Left 04/25/2016   Procedure: CYSTOSCOPY WITH STENT PLACEMENT;  Surgeon: [name], MD;  Location: [facility];  Service: Urology;  Laterality: Left;  . DILATION AND CURETTAGE OF UTERUS    . IVC FILTER INSERTION  03/26/2016   [facility]  . LAPAROSCOPIC GASTRIC RESTRICTIVE DUODENAL PROCEDURE (DUODENAL SWITCH)  03/2016  . LITHOTRIPSY    . TONSILLECTOMY AND ADENOIDECTOMY    . URETEROSCOPY WITH HOLMIUM LASER LITHOTRIPSY Left 05/14/2016   Procedure: URETEROSCOPY WITH HOLMIUM LASER LITHOTRIPSY;  Surgeon: [name], MD;  Location: [facility];  Service: Urology;  Laterality: Left;   Social History:   Social History   Social History  . Marital status: Married    Spouse name: N/A  . Number of children: 1  . Years of education: N/A   Occupational History  . Home maker    Social History Main Topics  . Smoking status: Never Smoker  . Smokeless tobacco: Never Used  . Alcohol use 0.0 - 0.6 oz/week     Comment: occasional < 1 month  . Drug use: No  . Sexual activity: Yes    Partners: Male    Birth control/ protection: None, IUD   Other Topics Concern  . None   Social History Narrative   1 child, 2 step-sons      Regular exercise-no   Diet: no fast food, likes sweets   Additional Social History:  Married X 11 years. Has  Two sons ages  24 and 74.  Has 2 step sons ages 17 and 46. 39 yo is in Hotel manager and is married. 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  Husband is a retired Administrator, [employer] Service and has PTSD.    Musculoskeletal: Strength & Muscle Tone: within normal limits Gait & Station: normal Patient leans: N/A  Psychiatric Specialty Exam: Review of Systems  Constitutional: Positive for malaise/fatigue and weight loss.  Eyes: Negative.   Respiratory: Negative.   Cardiovascular: Negative for palpitations and leg swelling.   Gastrointestinal: Negative.   Genitourinary: Negative.   Musculoskeletal: Negative for back pain, joint pain and neck pain.  Skin: Negative for rash.  Neurological: Negative.   Psychiatric/Behavioral: Positive for depression. Negative for suicidal ideas. The patient is nervous/anxious. The patient does not have insomnia.     Blood pressure 115/72, pulse 71, temperature 99.1 F (37.3 C), temperature source Oral, weight (!) 310 lb 6.4 oz (140.8 kg).Body mass index is 53.28 kg/m.  General Appearance: Casual  Eye Contact:  Fair  Speech:  Clear and Coherent  Volume:  Normal  Mood:  Anxious  Affect:  Congruent  Thought Process:  Coherent  Orientation:  Full (Time, Place, and Person)  Thought Content:  WDL  Suicidal Thoughts:  No  Homicidal Thoughts:  No  Memory:  Immediate;   Fair  Judgement:  Fair  Insight:  Fair  Psychomotor Activity:  Normal  Concentration:  Fair  Recall:  [employer] of Knowledge:Fair  Language: Fair  Akathisia:  No  Handed:  Right  AIMS (if indicated):    Assets:  Communication Skills Desire for Improvement Social Support  ADL's:  Intact  Cognition: WNL  Sleep:  Difficulty going to sleep   Is the patient at risk to self?  No. Has the patient been a risk to self in the past 6 months?  No. Has the patient been a risk to self within the distant past?  No. Is the patient a risk to others?  No. Has the patient been a risk to others in the past 6 months?  No. Has the patient been a risk to others within the distant past?  No.  Allergies:  No Known Allergies Current Medications: Current Outpatient Prescriptions  Medication Sig Dispense Refill  . ARIPiprazole (ABILIFY) 5 MG tablet Take 1 tablet (5 mg total) by mouth daily. 90 tablet 1  . Biotin 10 MG CAPS Take 5,000 mg by mouth daily.     . Biotin 5 MG TBDP Take by mouth.    . calcium citrate-vitamin D 500-400 MG-UNIT chewable tablet Chew by mouth.    . cetirizine (ZYRTEC) 10 MG tablet Take by mouth.     . escitalopram (LEXAPRO) 10 MG tablet Take 1 tablet (10 mg total) by mouth daily. 90 tablet 1  . lamoTRIgine (LAMICTAL) 100 MG tablet Take 1 tablet (100 mg total) by mouth daily. 90 tablet 1  . Melatonin 10 MG TABS Take 10 mg by mouth daily as needed. otc 30 tablet 1  . Multiple Vitamin (MULTIVITAMIN) tablet Take 1 tablet by mouth daily.    Marland Kitchen omeprazole (PRILOSEC) 20 MG capsule Take 20 mg by mouth 2 (two) times daily.     No current facility-administered medications for this visit.       Substance Abuse History in the last 12 months:  No.  Consequences of Substance Abuse: NA  Medical Decision Making:  Review of Psycho-Social Stressors (1) and Established Problem, Worsening (2)  Treatment Plan Summary: Medication management   Continue  Lexapro 10 mg daily.- Patient has supply   Continue  Abilify 2.5  mg daily- M, W, F - patient has supply.  Continue lamotrigine  100 mg daily-patient has supply  She will take melatonin at bedtime to help her sleep.    Discussed with patient about the side effects of medication. She agreed with the plan.   She will follow-up in 1 months  or earlier depending on her symptoms.  Discussed with her about the side effects of medications and she demonstrated understanding     More than 50% of the time spent in psychoeducation, counseling and coordination of care.     This note was generated in part or whole with voice recognition software. Voice regonition is usually quite accurate but there are transcription errors that can and very often do occur. I apologize for any typographical errors that were not detected and corrected.    Brandy Hale, MD  8/27/20189:09 AM

## 2016-09-25 ENCOUNTER — Other Ambulatory Visit: Payer: Self-pay | Admitting: Psychiatry

## 2016-09-27 ENCOUNTER — Telehealth: Payer: Self-pay | Admitting: Psychiatry

## 2016-10-01 ENCOUNTER — Encounter: Payer: Self-pay | Admitting: Obstetrics & Gynecology

## 2016-10-01 ENCOUNTER — Ambulatory Visit (INDEPENDENT_AMBULATORY_CARE_PROVIDER_SITE_OTHER): Admitting: Obstetrics & Gynecology

## 2016-10-01 VITALS — BP 124/78

## 2016-10-01 DIAGNOSIS — Z975 Presence of (intrauterine) contraceptive device: Secondary | ICD-10-CM

## 2016-10-01 DIAGNOSIS — N644 Mastodynia: Secondary | ICD-10-CM

## 2016-10-01 DIAGNOSIS — B372 Candidiasis of skin and nail: Secondary | ICD-10-CM | POA: Diagnosis not present

## 2016-10-01 DIAGNOSIS — N921 Excessive and frequent menstruation with irregular cycle: Secondary | ICD-10-CM

## 2016-10-01 MED ORDER — NORETHINDRONE 0.35 MG PO TABS: 1.0000 | ORAL_TABLET | Freq: Every day | ORAL | 4 refills | Status: DC

## 2016-10-01 MED ORDER — NYSTATIN 100000 UNIT/GM EX POWD: Freq: Two times a day (BID) | CUTANEOUS | 3 refills | Status: DC

## 2016-10-01 NOTE — Progress Notes (Signed)
    Sierra [name] May 20, 1980 [id number]        36 y.o.  [id number]   RP:  Menometrorrhagia on Paragard IUD and Breast tenderness  HPI:  S/P Bariatric surgery 03/2016 at [facility].  Lost 95 Lbs so far!  :)   Paragard IUD x 2016.  LMP 09/21/2016 was 2 wks early and lasted x 8 days with very heavy flow the whole time.  Labs with Bariatric surgeon 09/03/2016 Hb 13.1, HBA1C 4.7.  Past medical history,surgical history, problem list, medications, allergies, family history and social history were all reviewed and documented in the EPIC chart.  Directed ROS with pertinent positives and negatives documented in the history of present illness/assessment and plan.  Exam:  Vitals:   10/01/16 1424  BP: 124/78   General appearance:  Normal  Breasts:  Rt and Lt breasts normal in appearance, no lump felt, NT.  No Axillary LN felt.  Skin normal.  No nipple d/c.  Abdo:  Erythema of skin under panus.  Itchy per patient.  Gyn exam:  Vulva normal.  Speculum:  Cervix normal, no polyp.  Strings visible, no IUD part seen.  Vagina normal.  Normal vaginal secretions.                     Bimanual exam:  Uterus AV, mobile, NT, normal volume.  No adnexal mass/NT.    Assessment/Plan:  36 y.o. [id number]   1. Menorrhagia with irregular cycle Heavy periods progressively worsening in the past few months.  LMP was at 2 wks interval.  Had labs 09/03/2016 with Bariatric Surgeon showing a normal Hb at 13.1 and HBA1C normal at 4.7.  Will complete work-up with TSH today.  Normal Gyn exam.  F/U Pelvic US to r/o Polyps/Fibroids/Endometrial pathology.  Start Camila to controle cycle hormonally.  Usage/risks reviewed. - TSH - US Transvaginal Non-OB; Future  2. IUD contraception Strings seen.  Will confirm good placement by Pelvic US at f/u.  Well tolerated, but heavy menses.  If no improvement of menstrual flow on Progestin pill, will consider removal. - US Transvaginal Non-OB; Future  3. Breast tenderness in female Normal breast  exam bilaterally.  Probably mild Fibrocystic breast disease.  Observation on Progestin pill.  4. Yeast infection of the skin Yeast infection under panus.  Nystatin powder prescribed as needed.  5. Morbid obesity (HCC) Bariatric surgery 03/2016.  Lost 95 Lbs since surgery.   Counseling on above issues >50% x 25 minutes  Genia Del MD, 2:51 PM 10/01/2016

## 2016-10-01 NOTE — Patient Instructions (Signed)
1. Menorrhagia with irregular cycle Heavy periods progressively worsening in the past few months.  LMP was at 2 wks interval.  Had labs 09/03/2016 with Bariatric Surgeon showing a normal Hb at 13.1 and HBA1C normal at 4.7.  Will complete work-up with TSH today.  Normal Gyn exam.  F/U Pelvic US to r/o Polyps/Fibroids/Endometrial pathology.  Start Camila to controle cycle hormonally.  Usage/risks reviewed. - TSH - US Transvaginal Non-OB; Future  2. IUD contraception Strings seen.  Will confirm good placement by Pelvic US at f/u.  Well tolerated, but heavy menses.  If no improvement of menstrual flow on Progestin pill, will consider removal. - US Transvaginal Non-OB; Future  3. Breast tenderness in female Normal breast exam bilaterally.  Probably mild Fibrocystic breast disease.  Observation on Progestin pill.  4. Yeast infection of the skin Yeast infection under panus.  Nystatin powder prescribed as needed.  5. Morbid obesity (HCC) Bariatric surgery 03/2016.  Lost 95 Lbs since surgery.  Nancey, it was a pleasure to see you today!  I will inform you of your results and see you again soon for the Annual/Gyn exam and Pelvic US.

## 2016-10-02 LAB — TSH: TSH: 3.71 mIU/L

## 2016-10-15 ENCOUNTER — Ambulatory Visit (INDEPENDENT_AMBULATORY_CARE_PROVIDER_SITE_OTHER): Admitting: Psychiatry

## 2016-10-15 DIAGNOSIS — F5081 Binge eating disorder: Secondary | ICD-10-CM | POA: Diagnosis not present

## 2016-10-15 DIAGNOSIS — F3160 Bipolar disorder, current episode mixed, unspecified: Secondary | ICD-10-CM

## 2016-10-15 NOTE — Progress Notes (Signed)
Psychiatric Follow Up Notes.   Patient Identification: Sierra [name] MRN:  [id number] Date of Evaluation:  10/15/2016 [facility] Chief Complaint:    Visit Diagnosis:  Bipolar Disorder, Mixed.  Binge Eating Disorder   Diagnosis:   Patient Active Problem List   Diagnosis Date Noted  . Left ureteral calculus [N20.1] 04/25/2016  . Tingling in extremities [R20.2] 08/29/2015  . Acute nonintractable headache [R51] 08/29/2015  . Visual disturbance [H53.9] 08/23/2015  . Polydipsia [R63.1] 03/23/2014  . Binge eating disorder [F50.81] 03/23/2014  . Vaginitis and vulvovaginitis [N76.0] 12/31/2013  . Paronychia [IMO0002] 09/21/2013  . Essential hypertension, benign [I10] 06/05/2013  . Bilateral leg pain [M79.604, M79.605] 05/22/2013  . Right carpal tunnel syndrome [G56.01] 09/16/2012  . General counseling and advice on female contraception [Z30.09] 01/22/2012  . Anhydramnios [O41.00X0] 01/02/2012  . Umbilical cord complication [O69.9XX0] 12/07/2011  . High-risk pregnancy [O09.90] 11/09/2011  . Affective bipolar disorder (HCC) [F31.9] 10/12/2011  . H/O cesarean section [Z98.891] 10/12/2011  . Calculus of kidney [N20.0] 10/12/2011  . Obesity affecting pregnancy, antepartum [O99.210] 10/12/2011  . Bipolar affective disorder (HCC) [F31.9] 10/12/2011  . History of cesarean section [Z98.891] 10/12/2011  . SNORING [R06.09, R09.89] 07/23/2007  . EDEMA [R60.9] 06/11/2007  . MORBID OBESITY [E66.01] 12/23/2006  . BIPOLAR AFFECTIVE DISORDER [F31.9] 12/23/2006  . ANXIETY [F41.1] 12/23/2006  . DEPRESSION [F32.9] 12/23/2006  . GERD [K21.9] 12/23/2006  . MILD/UNSPEC PRE-ECLAMPSIA UNSPEC AS EPISODE CARE [IMO0002] 12/23/2006  . Fibromyalgia [M79.7] 12/23/2006   History of Present Illness:    Pt is a 36 year-old married  female who presented for  follow-up appointment. SheReported that she did not sleep well last night as she was working on her pictures as she  has a hobby of editing the pictures. Patient reported that she is doing well on her current medications. She stated that she feels stable and does not have any side effects of the medications. She reported that she has also started exercising and has been walking at least 20 minutes or more on a daily basis. Patient currently denied having any suicidal ideations or plans. She reported that she has been following with her surgeon Dr. Alva Garnet on a regular basis. She is planning to see her plastic surgeon in January after the holidays after she was more weight. She has almost lost 100 pounds since her surgery. She appears calm and alert during the interview. She reported that the medications have been helpful. She has good relationship with her family members. She denied having any perceptual disturbances. She denied having any suicidal homicidal ideations or plans.     Past Medical History:  Past Medical History:  Diagnosis Date  . Abdominal pain, other specified site   . Anxiety state, unspecified   . Bipolar disorder, unspecified (HCC)   . Depressive disorder, not elsewhere classified   . Edema   . Esophageal reflux   . Fibromyalgia   . Headache   . History of kidney stones   . Mild or unspecified pre-eclampsia, unspecified as to episode of care   . Morbid obesity (HCC)   . Myalgia and myositis, unspecified   . Other dyspnea and respiratory abnormality   . Preeclampsia    with first pregnancy  . Raynaud's disease   . Urinary tract infection, site not specified     Past Surgical History:  Procedure Laterality Date  . CESAREAN SECTION     X 2  . CHOLECYSTECTOMY  03/2006  . CYSTOSCOPY W/ URETERAL STENT  PLACEMENT Left 05/14/2016   Procedure: CYSTOSCOPY WITH STENT REPLACEMENT;  Surgeon: [name], MD;  Location: [facility];  Service: Urology;  Laterality: Left;  . CYSTOSCOPY WITH STENT PLACEMENT Left 04/25/2016   Procedure: CYSTOSCOPY WITH STENT PLACEMENT;  Surgeon: [name], MD;   Location: [facility];  Service: Urology;  Laterality: Left;  . DILATION AND CURETTAGE OF UTERUS    . IVC FILTER INSERTION  03/26/2016   [facility]  . LAPAROSCOPIC GASTRIC RESTRICTIVE DUODENAL PROCEDURE (DUODENAL SWITCH)  03/2016  . LITHOTRIPSY    . TONSILLECTOMY AND ADENOIDECTOMY    . URETEROSCOPY WITH HOLMIUM LASER LITHOTRIPSY Left 05/14/2016   Procedure: URETEROSCOPY WITH HOLMIUM LASER LITHOTRIPSY;  Surgeon: [name], MD;  Location: [facility];  Service: Urology;  Laterality: Left;   Social History:   Social History   Social History  . Marital status: Married    Spouse name: Sierra [name]  . Number of children: 1  . Years of education: Sierra [name]   Occupational History  . Home maker    Social History Main Topics  . Smoking status: Never Smoker  . Smokeless tobacco: Never Used  . Alcohol use 0.0 - 0.6 oz/week     Comment: occasional < 1 month  . Drug use: No  . Sexual activity: Yes    Partners: Male    Birth control/ protection: None, IUD   Other Topics Concern  . Not on file   Social History Narrative   1 child, 2 step-sons      Regular exercise-no   Diet: no fast food, likes sweets   Additional Social History:  Married X 11 years. Has  Two sons ages  32 and 63.  Has 2 step sons ages 27 and 6. 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  Husband is a retired Administrator, [employer] Service and has PTSD.    Musculoskeletal: Strength & Muscle Tone: within normal limits Gait & Station: normal Patient leans: Sierra [name]  Psychiatric Specialty Exam: Review of Systems  Constitutional: Positive for malaise/fatigue and weight loss.  Eyes: Negative.   Respiratory: Negative.   Cardiovascular: Negative for palpitations and leg swelling.  Gastrointestinal: Negative.   Genitourinary: Negative.   Musculoskeletal: Negative for back pain, joint pain and neck pain.  Skin: Negative for rash.  Neurological: Negative.   Psychiatric/Behavioral: Positive for depression. Negative for suicidal ideas.  The patient is nervous/anxious. The patient does not have insomnia.     Last menstrual period 09/21/2016.There is no height or weight on file to calculate BMI.  General Appearance: Casual  Eye Contact:  Fair  Speech:  Clear and Coherent  Volume:  Normal  Mood:  Anxious  Affect:  Congruent  Thought Process:  Coherent  Orientation:  Full (Time, Place, and Person)  Thought Content:  WDL  Suicidal Thoughts:  No  Homicidal Thoughts:  No  Memory:  Immediate;   Fair  Judgement:  Fair  Insight:  Fair  Psychomotor Activity:  Normal  Concentration:  Fair  Recall:  [employer] of Knowledge:Fair  Language: Fair  Akathisia:  No  Handed:  Right  AIMS (if indicated):    Assets:  Communication Skills Desire for Improvement Social Support  ADL's:  Intact  Cognition: WNL  Sleep:  Difficulty going to sleep   Is the patient at risk to self?  No. Has the patient been a risk to self in the past 6 months?  No. Has the patient been a risk to self within the distant  past?  No. Is the patient a risk to others?  No. Has the patient been a risk to others in the past 6 months?  No. Has the patient been a risk to others within the distant past?  No.  Allergies:  No Known Allergies Current Medications: Current Outpatient Prescriptions  Medication Sig Dispense Refill  . ARIPiprazole (ABILIFY) 5 MG tablet Take 1 tablet (5 mg total) by mouth daily. 1/2 pill on M, W, F- pt has supply 90 tablet 1  . Biotin 10 MG CAPS Take 5,000 mg by mouth daily.     . Biotin 5 MG TBDP Take by mouth.    . calcium citrate-vitamin D 500-400 MG-UNIT chewable tablet Chew by mouth.    . cetirizine (ZYRTEC) 10 MG tablet Take by mouth.    . cholecalciferol (VITAMIN D) 1000 units tablet Take 1,000 Units by mouth daily.    Marland Kitchen escitalopram (LEXAPRO) 10 MG tablet Take 1 tablet (10 mg total) by mouth daily. 90 tablet 1  . lamoTRIgine (LAMICTAL) 100 MG tablet Take 1 tablet (100 mg total) by mouth daily. 90 tablet 1  . Melatonin 10  MG TABS Take 10 mg by mouth daily as needed. otc 30 tablet 1  . Multiple Vitamin (MULTIVITAMIN) tablet Take 1 tablet by mouth daily.    . norethindrone (MICRONOR,CAMILA,ERRIN) 0.35 MG tablet Take 1 tablet (0.35 mg total) by mouth daily. 3 Package 4  . nystatin (MYCOSTATIN/NYSTOP) powder Apply topically 2 (two) times daily. 15 g 3  . omeprazole (PRILOSEC) 20 MG capsule Take 20 mg by mouth 2 (two) times daily.     No current facility-administered medications for this visit.       Substance Abuse History in the last 12 months:  No.  Consequences of Substance Abuse: NA  Medical Decision Making:  Review of Psycho-Social Stressors (1) and Established Problem, Worsening (2)  Treatment Plan Summary: Medication management   Continue  Lexapro 10 mg daily.- Patient has supply   Continue  Abilify 2.5  mg daily- M, W, F - patient has supply.  Continue lamotrigine  100 mg daily-patient has supply  She will take melatonin at bedtime to help her sleep.  Patient has supply of the medications.    Discussed with patient about the side effects of medication. She agreed with the plan.   She will follow-up in 2 months  or earlier depending on her symptoms.  Discussed with her about the side effects of medications and she demonstrated understanding     More than 50% of the time spent in psychoeducation, counseling and coordination of care.     This note was generated in part or whole with voice recognition software. Voice regonition is usually quite accurate but there are transcription errors that can and very often do occur. I apologize for any typographical errors that were not detected and corrected.    Brandy Hale, MD  10/1/20188:56 AM

## 2016-10-16 ENCOUNTER — Ambulatory Visit: Admitting: Psychiatry

## 2016-10-24 ENCOUNTER — Other Ambulatory Visit

## 2016-10-24 ENCOUNTER — Ambulatory Visit: Admitting: Obstetrics & Gynecology

## 2016-11-08 ENCOUNTER — Other Ambulatory Visit

## 2016-11-08 ENCOUNTER — Encounter: Admitting: Obstetrics & Gynecology

## 2016-11-19 ENCOUNTER — Telehealth: Payer: Self-pay

## 2016-11-19 ENCOUNTER — Ambulatory Visit (INDEPENDENT_AMBULATORY_CARE_PROVIDER_SITE_OTHER)

## 2016-11-19 VITALS — BP 131/84 | HR 116 | Ht 64.0 in | Wt 298.0 lb

## 2016-11-19 DIAGNOSIS — R300 Dysuria: Secondary | ICD-10-CM

## 2016-11-19 LAB — MICROSCOPIC EXAMINATION

## 2016-11-19 LAB — URINALYSIS, COMPLETE
BILIRUBIN UA: NEGATIVE
Glucose, UA: NEGATIVE
Ketones, UA: NEGATIVE
Nitrite, UA: POSITIVE — AB
PH UA: 6 (ref 5.0–7.5)
SPEC GRAV UA: 1.025 (ref 1.005–1.030)
Urobilinogen, Ur: 0.2 mg/dL (ref 0.2–1.0)

## 2016-11-19 MED ORDER — SULFAMETHOXAZOLE-TRIMETHOPRIM 800-160 MG PO TABS: 1.0000 | ORAL_TABLET | Freq: Two times a day (BID) | ORAL | 0 refills | Status: DC

## 2016-11-19 NOTE — Telephone Encounter (Signed)
See previous telephone encounter for treatment called in.

## 2016-11-19 NOTE — Telephone Encounter (Signed)
-----   Message from Vanna ScotlandAshley Brandon, MD sent at 11/19/2016  3:08 PM EST ----- Please treat, see note.    Vanna ScotlandAshley Brandon, MD

## 2016-11-19 NOTE — Progress Notes (Signed)
Based on review of urine and symptoms, please go ahead and treat with Bactrim DS twice daily.  Will adjust as needed.  Please let her know.  Vanna ScotlandAshley Autum Benfer, MD

## 2016-11-19 NOTE — Telephone Encounter (Signed)
Vanna ScotlandBrandon, Ashley, MD at 11/19/2016 11:15 AM   Status: Signed    Based on review of urine and symptoms, please go ahead and treat with Bactrim DS twice daily.  Will adjust as needed.  Please let her know.  Vanna ScotlandAshley Brandon, MD     Spoke with pt in reference to needing bactrim and will adjust as needed. Pt voiced understanding. Medication sent to pharmacy.

## 2016-11-19 NOTE — Progress Notes (Signed)
Pt presents today with c/o dysuria. Pt provided a clean catch specimen for u/a and cx.   Blood pressure 131/84, pulse (!) 116, height 5\' 4"  (1.626 m), weight 298 lb (135.2 kg).

## 2016-11-21 ENCOUNTER — Encounter: Payer: Self-pay | Admitting: Obstetrics & Gynecology

## 2016-11-21 ENCOUNTER — Ambulatory Visit (INDEPENDENT_AMBULATORY_CARE_PROVIDER_SITE_OTHER)

## 2016-11-21 ENCOUNTER — Other Ambulatory Visit: Payer: Self-pay

## 2016-11-21 ENCOUNTER — Ambulatory Visit (INDEPENDENT_AMBULATORY_CARE_PROVIDER_SITE_OTHER): Admitting: Obstetrics & Gynecology

## 2016-11-21 VITALS — BP 132/86 | Ht 63.0 in | Wt 302.0 lb

## 2016-11-21 DIAGNOSIS — Z1151 Encounter for screening for human papillomavirus (HPV): Secondary | ICD-10-CM

## 2016-11-21 DIAGNOSIS — N921 Excessive and frequent menstruation with irregular cycle: Secondary | ICD-10-CM

## 2016-11-21 DIAGNOSIS — Z01419 Encounter for gynecological examination (general) (routine) without abnormal findings: Secondary | ICD-10-CM | POA: Diagnosis not present

## 2016-11-21 DIAGNOSIS — Z975 Presence of (intrauterine) contraceptive device: Secondary | ICD-10-CM

## 2016-11-21 DIAGNOSIS — Z113 Encounter for screening for infections with a predominantly sexual mode of transmission: Secondary | ICD-10-CM | POA: Diagnosis not present

## 2016-11-21 MED ORDER — SULFAMETHOXAZOLE-TRIMETHOPRIM 800-160 MG PO TABS: 1.0000 | ORAL_TABLET | Freq: Two times a day (BID) | ORAL | 0 refills | Status: AC

## 2016-11-21 NOTE — Progress Notes (Signed)
Sierra [name] [name] [id number]   History:    36 y.o. [id number]  RP:  Established patient presenting for annual gyn exam and Pelvic US for menometrorrhagia  HPI: Had menometrorrhagia.  On ParaGard IUD.  No abnormal bleeding on Progestin pill since last visit.  No pelvic pain.  Breasts normal.  Urine and bowel movements normal.  Body mass index 53.5.  Losing weight post bariatric surgery March 2018. Hemoglobin normal in the spring.  Hemoglobin A1c as well.   TSH normal October 01, 2016.  Past medical history,surgical history, family history and social history were all reviewed and documented in the EPIC chart.  Gynecologic History Patient's last menstrual period was 11/14/2016. Contraception: Paragard IUD Last Pap: 2016 or 2017. Results were: normal Last mammogram: Never  Obstetric History OB History  Gravida Para Term Preterm AB Living  5 2     2 2   SAB TAB Ectopic Multiple Live Births  2            # Outcome Date GA Lbr Len/2nd Weight Sex Delivery Anes PTL Lv  5 Gravida           4 SAB           3 SAB           2 Para           1 Para                ROS: A ROS was performed and pertinent positives and negatives are included in the history.  GENERAL: No fevers or chills. HEENT: No change in vision, no earache, sore throat or sinus congestion. NECK: No pain or stiffness. CARDIOVASCULAR: No chest pain or pressure. No palpitations. PULMONARY: No shortness of breath, cough or wheeze. GASTROINTESTINAL: No abdominal pain, nausea, vomiting or diarrhea, melena or bright red blood per rectum. GENITOURINARY: No urinary frequency, urgency, hesitancy or dysuria. MUSCULOSKELETAL: No joint or muscle pain, no back pain, no recent trauma. DERMATOLOGIC: No rash, no itching, no lesions. ENDOCRINE: No polyuria, polydipsia, no heat or cold intolerance. No recent change in weight. HEMATOLOGICAL: No anemia or easy bruising or bleeding. NEUROLOGIC: No headache, seizures, numbness, tingling  or weakness. PSYCHIATRIC: No depression, no loss of interest in normal activity or change in sleep pattern.     Exam:   LMP 11/14/2016   There is no height or weight on file to calculate BMI.  General appearance : Well developed well nourished female. No acute distress HEENT: Eyes: no retinal hemorrhage or exudates,  Neck supple, trachea midline, no carotid bruits, no thyroidmegaly Lungs: Clear to auscultation, no rhonchi or wheezes, or rib retractions  Heart: Regular rate and rhythm, no murmurs or gallops Breast:Examined in sitting and supine position were symmetrical in appearance, no palpable masses or tenderness,  no skin retraction, no nipple inversion, no nipple discharge, no skin discoloration, no axillary or supraclavicular lymphadenopathy Abdomen: no palpable masses or tenderness, no rebound or guarding Extremities: no edema or skin discoloration or tenderness  Pelvic: Vulva normal  Bartholin, Urethra, Skene Glands: Within normal limits             Vagina: No gross lesions or discharge  Cervix: No gross lesions or discharge.  Pap/HPV HR, Gono-Chlam done.  IUD strings visible.  Trimmed at patient's request.  Uterus  AV, normal size, shape and consistency, non-tender and mobile  Adnexa  Without masses or tenderness  Anus and perineum  normal    Pelvic US today:  T/V Uterus anteverted homogeneous measuring 10.99 x 6.39 x 4.99.  Endometrial lining thin at 1.8 mm.  IUD seen in normal position.  Right and left ovaries normal.  No apparent mass in the right and left adnexa.  No free fluid in the cul-de-sac.  Assessment/Plan:  36 y.o. female for annual exam   1. Encounter for routine gynecological examination with Papanicolaou smear of cervix Normal gyn exam. Pap with HPV done.  Breast exam normal.  Morbid obesity status post bariatric surgery in March 2018.  Currently losing weight on a low calorie diet with regular physical activity.  2. IUD contraception IUD in good IU position  per US.  Strings rather long, trimmed at patient's request.  3. Menometrorrhagia Completely normal Pelvic US today.  Well on add back Progestin Pill.  4. Screen for STD (sexually transmitted disease) - Gono-Chlam on Pap - HIV antibody (with reflex) - RPR - Hepatitis B Surface AntiGEN - Hepatitis C Antibody  Counseling on above issues >50% x 10 minutes.  Genia DelMarie-Lyne Akasia Ahmad MD, 10:55 AM 11/21/2016

## 2016-11-22 LAB — HEPATITIS C ANTIBODY
Hepatitis C Ab: NONREACTIVE
SIGNAL TO CUT-OFF: 0.02 (ref <1.00)

## 2016-11-22 LAB — CULTURE, URINE COMPREHENSIVE

## 2016-11-22 LAB — HIV ANTIBODY (ROUTINE TESTING W REFLEX): HIV 1&2 Ab, 4th Generation: NONREACTIVE

## 2016-11-22 LAB — HEPATITIS B SURFACE ANTIGEN: HEP B S AG: NONREACTIVE

## 2016-11-22 LAB — RPR: RPR: NONREACTIVE

## 2016-11-23 LAB — PAP IG, CT-NG NAA, HPV HIGH-RISK
C. TRACHOMATIS RNA, TMA: NOT DETECTED
HPV DNA High Risk: NOT DETECTED
N. gonorrhoeae RNA, TMA: NOT DETECTED

## 2016-11-25 NOTE — Patient Instructions (Signed)
1. Encounter for routine gynecological examination with Papanicolaou smear of cervix Normal gyn exam. Pap with HPV done.  Breast exam normal.  Morbid obesity status post bariatric surgery in March 2018.  Currently losing weight on a low calorie diet with regular physical activity.  2. IUD contraception IUD in good IU position per [place].  Strings rather long, trimmed at patient's request.  3. Menometrorrhagia Completely normal Pelvic US today.  Well on add back Progestin Pill.  4. Screen for STD (sexually transmitted disease) - Gono-Chlam on Pap - HIV antibody (with reflex) - RPR - Hepatitis B Surface AntiGEN - Hepatitis C Antibody  Sierra [name], it was a pleasure seeing you today.  I will inform you of your lab results as soon as available.  Health Maintenance, Female Adopting a healthy lifestyle and getting preventive care can go a long way to promote health and wellness. Talk with your health care provider about what schedule of regular examinations is right for you. This is a good chance for you to check in with your provider about disease prevention and staying healthy. In between checkups, there are plenty of things you can do on your own. Experts have done a lot of research about which lifestyle changes and preventive measures are most likely to keep you healthy. Ask your health care provider for more information. Weight and diet Eat a healthy diet  Be sure to include plenty of vegetables, fruits, low-fat dairy products, and lean protein.  Do not eat a lot of foods high in solid fats, added sugars, or salt.  Get regular exercise. This is one of the most important things you can do for your health. ? Most adults should exercise for at least 150 minutes each week. The exercise should increase your heart rate and make you sweat (moderate-intensity exercise). ? Most adults should also do strengthening exercises at least twice a week. This is in addition to the moderate-intensity  exercise.  Maintain a healthy weight  Body mass index (BMI) is a measurement that can be used to identify possible weight problems. It estimates body fat based on height and weight. Your health care provider can help determine your BMI and help you achieve or maintain a healthy weight.  For females 8 years of age and older: ? A BMI below 18.5 is considered underweight. ? A BMI of 18.5 to 24.9 is normal. ? A BMI of 25 to 29.9 is considered overweight. ? A BMI of 30 and above is considered obese.  Watch levels of cholesterol and blood lipids  You should start having your blood tested for lipids and cholesterol at 34 years of age, then have this test every 5 years.  You may need to have your cholesterol levels checked more often if: ? Your lipid or cholesterol levels are high. ? You are older than 34 years of age. ? You are at high risk for heart disease.  Cancer screening Lung Cancer  Lung cancer screening is recommended for adults 28-52 years old who are at high risk for lung cancer because of a history of smoking.  A yearly low-dose CT scan of the lungs is recommended for people who: ? Currently smoke. ? Have quit within the past 15 years. ? Have at least a 30-pack-year history of smoking. A pack year is smoking an average of one pack of cigarettes a day for 1 year.  Yearly screening should continue until it has been 15 years since you quit.  Yearly screening should stop if you  develop a health problem that would prevent you from having lung cancer treatment.  Breast Cancer  Practice breast self-awareness. This means understanding how your breasts normally appear and feel.  It also means doing regular breast self-exams. Let your health care provider know about any changes, no matter how small.  If you are in your 20s or 30s, you should have a clinical breast exam (CBE) by a health care provider every 1-3 years as part of a regular health exam.  If you are 56 or older, have  a CBE every year. Also consider having a breast X-ray (mammogram) every year.  If you have a family history of breast cancer, talk to your health care provider about genetic screening.  If you are at high risk for breast cancer, talk to your health care provider about having an MRI and a mammogram every year.  Breast cancer gene (BRCA) assessment is recommended for women who have family members with BRCA-related cancers. BRCA-related cancers include: ? Breast. ? Ovarian. ? Tubal. ? Peritoneal cancers.  Results of the assessment will determine the need for genetic counseling and BRCA1 and BRCA2 testing.  Cervical Cancer Your health care provider may recommend that you be screened regularly for cancer of the pelvic organs (ovaries, uterus, and vagina). This screening involves a pelvic examination, including checking for microscopic changes to the surface of your cervix (Pap test). You may be encouraged to have this screening done every 3 years, beginning at age 81.  For women ages 22-65, health care providers may recommend pelvic exams and Pap testing every 3 years, or they may recommend the Pap and pelvic exam, combined with testing for human papilloma virus (HPV), every 5 years. Some types of HPV increase your risk of cervical cancer. Testing for HPV may also be done on women of any age with unclear Pap test results.  Other health care providers may not recommend any screening for nonpregnant women who are considered low risk for pelvic cancer and who do not have symptoms. Ask your health care provider if a screening pelvic exam is right for you.  If you have had past treatment for cervical cancer or a condition that could lead to cancer, you need Pap tests and screening for cancer for at least 20 years after your treatment. If Pap tests have been discontinued, your risk factors (such as having a new sexual partner) need to be reassessed to determine if screening should resume. Some women have  medical problems that increase the chance of getting cervical cancer. In these cases, your health care provider may recommend more frequent screening and Pap tests.  Colorectal Cancer  This type of cancer can be detected and often prevented.  Routine colorectal cancer screening usually begins at 34 years of age and continues through 34 years of age.  Your health care provider may recommend screening at an earlier age if you have risk factors for colon cancer.  Your health care provider may also recommend using home test kits to check for hidden blood in the stool.  A small camera at the end of a tube can be used to examine your colon directly (sigmoidoscopy or colonoscopy). This is done to check for the earliest forms of colorectal cancer.  Routine screening usually begins at age 60.  Direct examination of the colon should be repeated every 5-10 years through 34 years of age. However, you may need to be screened more often if early forms of precancerous polyps or small growths are found.  Skin Cancer  Check your skin from head to toe regularly.  Tell your health care provider about any new moles or changes in moles, especially if there is a change in a mole's shape or color.  Also tell your health care provider if you have a mole that is larger than the size of a pencil eraser.  Always use sunscreen. Apply sunscreen liberally and repeatedly throughout the day.  Protect yourself by wearing long sleeves, pants, a wide-brimmed hat, and sunglasses whenever you are outside.  Heart disease, diabetes, and high blood pressure  High blood pressure causes heart disease and increases the risk of stroke. High blood pressure is more likely to develop in: ? People who have blood pressure in the high end of the normal range (130-139/85-89 mm Hg). ? People who are overweight or obese. ? People who are African American.  If you are 85-72 years of age, have your blood pressure checked every 3-5  years. If you are 58 years of age or older, have your blood pressure checked every year. You should have your blood pressure measured twice-once when you are at a hospital or clinic, and once when you are not at a hospital or clinic. Record the average of the two measurements. To check your blood pressure when you are not at a hospital or clinic, you can use: ? An automated blood pressure machine at a pharmacy. ? A home blood pressure monitor.  If you are between 19 years and 54 years old, ask your health care provider if you should take aspirin to prevent strokes.  Have regular diabetes screenings. This involves taking a blood sample to check your fasting blood sugar level. ? If you are at a normal weight and have a low risk for diabetes, have this test once every three years after 34 years of age. ? If you are overweight and have a high risk for diabetes, consider being tested at a younger age or more often. Preventing infection Hepatitis B  If you have a higher risk for hepatitis B, you should be screened for this virus. You are considered at high risk for hepatitis B if: ? You were born in a country where hepatitis B is common. Ask your health care provider which countries are considered high risk. ? Your parents were born in a high-risk country, and you have not been immunized against hepatitis B (hepatitis B vaccine). ? You have HIV or AIDS. ? You use needles to inject street drugs. ? You live with someone who has hepatitis B. ? You have had sex with someone who has hepatitis B. ? You get hemodialysis treatment. ? You take certain medicines for conditions, including cancer, organ transplantation, and autoimmune conditions.  Hepatitis C  Blood testing is recommended for: ? Everyone born from [birth year] through 1963. ? Anyone with known risk factors for hepatitis C.  Sexually transmitted infections (STIs)  You should be screened for sexually transmitted infections (STIs) including  gonorrhea and chlamydia if: ? You are sexually active and are younger than 34 years of age. ? You are older than 34 years of age and your health care provider tells you that you are at risk for this type of infection. ? Your sexual activity has changed since you were last screened and you are at an increased risk for chlamydia or gonorrhea. Ask your health care provider if you are at risk.  If you do not have HIV, but are at risk, it may be recommended that you  take a prescription medicine daily to prevent HIV infection. This is called pre-exposure prophylaxis (PrEP). You are considered at risk if: ? You are sexually active and do not regularly use condoms or know the HIV status of your partner(s). ? You take drugs by injection. ? You are sexually active with a partner who has HIV.  Talk with your health care provider about whether you are at high risk of being infected with HIV. If you choose to begin PrEP, you should first be tested for HIV. You should then be tested every 3 months for as long as you are taking PrEP. Pregnancy  If you are premenopausal and you may become pregnant, ask your health care provider about preconception counseling.  If you may become pregnant, take 400 to 800 micrograms (mcg) of folic acid every day.  If you want to prevent pregnancy, talk to your health care provider about birth control (contraception). Osteoporosis and menopause  Osteoporosis is a disease in which the bones lose minerals and strength with aging. This can result in serious bone fractures. Your risk for osteoporosis can be identified using a bone density scan.  If you are 53 years of age or older, or if you are at risk for osteoporosis and fractures, ask your health care provider if you should be screened.  Ask your health care provider whether you should take a calcium or vitamin D supplement to lower your risk for osteoporosis.  Menopause may have certain physical symptoms and  risks.  Hormone replacement therapy may reduce some of these symptoms and risks. Talk to your health care provider about whether hormone replacement therapy is right for you. Follow these instructions at home:  Schedule regular health, dental, and eye exams.  Stay current with your immunizations.  Do not use any tobacco products including cigarettes, chewing tobacco, or electronic cigarettes.  If you are pregnant, do not drink alcohol.  If you are breastfeeding, limit how much and how often you drink alcohol.  Limit alcohol intake to no more than 1 drink per day for nonpregnant women. One drink equals 12 ounces of beer, 5 ounces of wine, or 1 ounces of hard liquor.  Do not use street drugs.  Do not share needles.  Ask your health care provider for help if you need support or information about quitting drugs.  Tell your health care provider if you often feel depressed.  Tell your health care provider if you have ever been abused or do not feel safe at home. This information is not intended to replace advice given to you by your health care provider. Make sure you discuss any questions you have with your health care provider. Document Released: 07/17/2010 Document Revised: 06/09/2015 Document Reviewed: 10/05/2014 Elsevier Interactive Patient Education  [employer].

## 2016-12-11 ENCOUNTER — Telehealth: Payer: Self-pay | Admitting: *Deleted

## 2016-12-11 MED ORDER — NORETHINDRONE 0.35 MG PO TABS: 1.0000 | ORAL_TABLET | Freq: Every day | ORAL | 4 refills | Status: DC

## 2016-12-11 NOTE — Telephone Encounter (Signed)
Patient called requesting birth control pills Rx from [facility] 117/18 sent to mail order pharmacy. Rx sent. With refills.

## 2016-12-14 ENCOUNTER — Ambulatory Visit
Admission: RE | Admit: 2016-12-14 | Discharge: 2016-12-14 | Disposition: A | Discharge status: Home / Self Care | Source: Ambulatory Visit | Attending: Urology | Admitting: Urology

## 2016-12-14 ENCOUNTER — Ambulatory Visit (INDEPENDENT_AMBULATORY_CARE_PROVIDER_SITE_OTHER): Admitting: Urology

## 2016-12-14 ENCOUNTER — Encounter: Payer: Self-pay | Admitting: Urology

## 2016-12-14 VITALS — BP 122/72 | HR 76 | Ht 64.0 in | Wt 297.0 lb

## 2016-12-14 DIAGNOSIS — Z87442 Personal history of urinary calculi: Secondary | ICD-10-CM | POA: Diagnosis not present

## 2016-12-14 DIAGNOSIS — N200 Calculus of kidney: Secondary | ICD-10-CM

## 2016-12-14 NOTE — Progress Notes (Signed)
12/14/2016 12:39 PM   [name] Wooldridge 1980-03-16 [id number]  Referring provider: Armando GangLindley, Cheryl P, FNP [address] [place], [place] [address]  Chief Complaint  Patient presents with  . Nephrolithiasis    34month w/KUB    HPI: 36 year old female who returns today for routine follow-up.  She previously underwent ureteroscopy for treatment of 11 mm left proximal ureteral stone in 04/2016.  She initially presented with pyelonephritis secondary to Klebsiella.  Follow-up renal ultrasound 06/12/2016 shows interval resolution of her hydronephrosis as well as no left-sided renal stones.  She does have a personal history of kidney stones and has passed multiple stones spontaneously over the past several years. Her first stone was at age 36 requiring shockwave lithotripsy. She also has a strong family history of stones. 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  Stone analysis shows 10% calcium oxalate dihydrate, 65% calcium oxalate monohydrate, 25% calcium phosphate.  Today, she is doing well.  She has no flank pain, gross hematuria, or urinary issues.  She had no stone issues since her last visit.  She is been trying to drink more water as well as lemonade.  She was treated in the interim for an isolated urinary tract infection.  She has rare infrequent infections associated with sexual activity.  She denies any dysuria, gross hematuria, or any other UTI symptoms today.  PMH: Past Medical History:  Diagnosis Date  . Abdominal pain, other specified site   . Anxiety state, unspecified   . Bipolar disorder, unspecified (HCC)   . Depressive disorder, not elsewhere classified   . Edema   . Esophageal reflux   . Fibromyalgia   . Headache   . History of kidney stones   . Mild or unspecified pre-eclampsia, unspecified as to episode of care   . Morbid obesity (HCC)   . Myalgia and myositis, unspecified   . Other dyspnea and respiratory  abnormality   . Preeclampsia    with first pregnancy  . Raynaud's disease   . Urinary tract infection, site not specified     Surgical History: Past Surgical History:  Procedure Laterality Date  . CESAREAN SECTION     X 2  . CHOLECYSTECTOMY  03/2006  . CYSTOSCOPY W/ URETERAL STENT PLACEMENT Left 05/14/2016   Procedure: CYSTOSCOPY WITH STENT REPLACEMENT;  Surgeon: Vanna ScotlandAshley Fayola Meckes, MD;  Location: [facility];  Service: Urology;  Laterality: Left;  . CYSTOSCOPY WITH STENT PLACEMENT Left 04/25/2016   Procedure: CYSTOSCOPY WITH STENT PLACEMENT;  Surgeon: Vanna ScotlandAshley Arsal Tappan, MD;  Location: [facility];  Service: Urology;  Laterality: Left;  . DILATION AND CURETTAGE OF UTERUS    . IVC FILTER INSERTION  03/26/2016   [facility]  . LAPAROSCOPIC GASTRIC RESTRICTIVE DUODENAL PROCEDURE (DUODENAL SWITCH)  03/2016  . LITHOTRIPSY    . TONSILLECTOMY AND ADENOIDECTOMY    . URETEROSCOPY WITH HOLMIUM LASER LITHOTRIPSY Left 05/14/2016   Procedure: URETEROSCOPY WITH HOLMIUM LASER LITHOTRIPSY;  Surgeon: Vanna ScotlandAshley Astin Sayre, MD;  Location: [facility];  Service: Urology;  Laterality: Left;    Home Medications:  Allergies as of 12/14/2016      Reactions   Molds & Smuts    Pollen Extract    Uncaria Tomentosa (cats Claw)    Milk-related Compounds    Able to tolerate yogurt; mild intolerance to milk/cheese   Pineapple    Soy Allergy    Strawberry Extract    Break out on tongue noted      Medication List  Accurate as of 12/14/16 12:39 PM. Always use your most recent med list.          ARIPiprazole 5 MG tablet Commonly known as:  ABILIFY Take 1 tablet (5 mg total) by mouth daily. 1/2 pill on M, W, F- pt has supply   Biotin 10 MG Caps Take 5,000 mg by mouth daily.   calcium citrate-vitamin D 500-400 MG-UNIT chewable tablet Chew by mouth.   cetirizine 10 MG tablet Commonly known as:  ZYRTEC Take by mouth.   cholecalciferol 1000 units tablet Commonly known as:  VITAMIN D Take 1,000 Units by mouth  daily.   COPPER PO by Intrauterine route.   escitalopram 10 MG tablet Commonly known as:  LEXAPRO Take 1 tablet (10 mg total) by mouth daily.   lamoTRIgine 100 MG tablet Commonly known as:  LAMICTAL Take 1 tablet (100 mg total) by mouth daily.   Melatonin 10 MG Tabs Take 10 mg by mouth daily as needed. otc   multivitamin tablet Take 1 tablet by mouth daily.   NIFEdipine 30 MG 24 hr tablet Commonly known as:  PROCARDIA-XL/ADALAT CC Take 30 mg by mouth daily.   norethindrone 0.35 MG tablet Commonly known as:  MICRONOR,CAMILA,ERRIN Take 1 tablet (0.35 mg total) by mouth daily.   omeprazole 20 MG capsule Commonly known as:  PRILOSEC Take 20 mg by mouth 2 (two) times daily.       Allergies:  Allergies  Allergen Reactions  . Molds & Smuts   . Pollen Extract   . Uncaria Tomentosa (Cats Claw)   . Milk-Related Compounds     Able to tolerate yogurt; mild intolerance to milk/cheese  . Pineapple   . Soy Allergy   . Strawberry Extract     Break out on tongue noted    Family History: Family History  Problem Relation Age of Onset  . Depression Father   . Alcohol abuse Father   . Depression Mother   . Hyperlipidemia Mother   . Sleep apnea Mother   . Depression Sister   . Hyperlipidemia Brother   . Depression Brother   . Hyperlipidemia Brother   . Depression Brother   . Alcohol abuse Brother   . Coronary artery disease Maternal Grandmother   . Heart attack Maternal Grandmother   . Diabetes Maternal Grandmother   . Lung cancer Maternal Grandfather   . Emphysema Maternal Grandfather   . Coronary artery disease Maternal Grandfather   . Lupus Unknown        Aunt  . Fibromyalgia Unknown        Aunt    Social History:  reports that  has never smoked. she has never used smokeless tobacco. She reports that she drinks alcohol. She reports that she does not use drugs.  ROS: UROLOGY Frequent Urination?: No Hard to postpone urination?: No Burning/pain with  urination?: No Get up at night to urinate?: No Leakage of urine?: No Urine stream starts and stops?: No Trouble starting stream?: No Do you have to strain to urinate?: No Blood in urine?: No Urinary tract infection?: No Sexually transmitted disease?: No Injury to kidneys or bladder?: No Painful intercourse?: No Weak stream?: No Currently pregnant?: No Vaginal bleeding?: No Last menstrual period?: n  Gastrointestinal Nausea?: No Vomiting?: No Indigestion/heartburn?: No Diarrhea?: No Constipation?: No  Constitutional Fever: No Night sweats?: No Weight loss?: No Fatigue?: No  Skin Skin rash/lesions?: No Itching?: No  Eyes Blurred vision?: No Double vision?: No  Ears/Nose/Throat Sore throat?: No Sinus problems?:  No  Hematologic/Lymphatic Swollen glands?: No Easy bruising?: No  Cardiovascular Leg swelling?: No Chest pain?: No  Respiratory Cough?: No Shortness of breath?: No  Endocrine Excessive thirst?: No  Musculoskeletal Back pain?: No Joint pain?: No  Neurological Headaches?: No Dizziness?: No  Psychologic Depression?: Yes Anxiety?: Yes  Physical Exam: BP 122/72   Pulse 76   Ht 5\' 4"  (1.626 m)   Wt 297 lb (134.7 kg)   LMP 12/12/2016 (Approximate)   BMI 50.98 kg/m   Constitutional:  Alert and oriented, No acute distress. HEENT: [place] AT, moist mucus membranes.  Trachea midline, no masses. Cardiovascular: No clubbing, cyanosis, or edema. Respiratory: Normal respiratory effort, no increased work of breathing. GI: Abdomen is soft, nontender, nondistended, no abdominal masses GU: No CVA tenderness.  Skin: No rashes, bruises or suspicious lesions. Lymph: No cervical or inguinal adenopathy. Neurologic: Grossly intact, no focal deficits, moving all 4 extremities. Psychiatric: Normal mood and affect.  Laboratory Data: Lab Results  Component Value Date   WBC 4.3 05/17/2016   HGB 13.5 05/17/2016   HCT 40.5 05/17/2016   MCV 86.7 05/17/2016     PLT 239 05/17/2016    Lab Results  Component Value Date   CREATININE 0.82 05/17/2016     Lab Results  Component Value Date   HGBA1C 5.0 03/23/2014    Urinalysis N/a  Pertinent Imaging: KUB today pesonally reviewed-no evidence of stones bilaterally  Assessment & Plan:    1. History of kidney stones Currently stone free without interval stone episode Offered 24 hour urine, declined  We reviewed stone prevention techniques today in detail Prior to this episode, her last stone episode was greater than 8 years ago  Return if symptoms worsen or fail to improve.  Vanna ScotlandAshley Linzi Ohlinger, MD  [facility] [address], [address] [place], [place] [address] [phone]

## 2016-12-17 ENCOUNTER — Encounter: Payer: Self-pay | Admitting: Psychiatry

## 2016-12-17 ENCOUNTER — Ambulatory Visit (INDEPENDENT_AMBULATORY_CARE_PROVIDER_SITE_OTHER): Admitting: Psychiatry

## 2016-12-17 VITALS — BP 120/79 | HR 90 | Temp 98.5°F | Wt 304.0 lb

## 2016-12-17 DIAGNOSIS — F5081 Binge eating disorder: Secondary | ICD-10-CM | POA: Diagnosis not present

## 2016-12-17 DIAGNOSIS — F3160 Bipolar disorder, current episode mixed, unspecified: Secondary | ICD-10-CM | POA: Diagnosis not present

## 2016-12-17 NOTE — Progress Notes (Signed)
Psychiatric Follow Up Notes.   Patient Identification: Sierra [name] MRN:  [id number] Date of Evaluation:  12/17/2016 [facility] Chief Complaint:    Visit Diagnosis:  Bipolar Disorder, Mixed.  Binge Eating Disorder   Diagnosis:   Patient Active Problem List   Diagnosis Date Noted  . Left ureteral calculus [N20.1] 04/25/2016  . Tingling in extremities [R20.2] 08/29/2015  . Acute nonintractable headache [R51] 08/29/2015  . Visual disturbance [H53.9] 08/23/2015  . Polydipsia [R63.1] 03/23/2014  . Binge eating disorder [F50.81] 03/23/2014  . Vaginitis and vulvovaginitis [N76.0] 12/31/2013  . Paronychia [IMO0002] 09/21/2013  . Essential hypertension, benign [I10] 06/05/2013  . Bilateral leg pain [M79.604, M79.605] 05/22/2013  . Right carpal tunnel syndrome [G56.01] 09/16/2012  . General counseling and advice on female contraception [Z30.09] 01/22/2012  . Anhydramnios [O41.00X0] 01/02/2012  . Umbilical cord complication [O69.9XX0] 12/07/2011  . High-risk pregnancy [O09.90] 11/09/2011  . Affective bipolar disorder (HCC) [F31.9] 10/12/2011  . H/O cesarean section [Z98.891] 10/12/2011  . Calculus of kidney [N20.0] 10/12/2011  . Obesity affecting pregnancy, antepartum [O99.210] 10/12/2011  . Bipolar affective disorder (HCC) [F31.9] 10/12/2011  . History of cesarean section [Z98.891] 10/12/2011  . SNORING [R06.09, R09.89] 07/23/2007  . EDEMA [R60.9] 06/11/2007  . MORBID OBESITY [E66.01] 12/23/2006  . BIPOLAR AFFECTIVE DISORDER [F31.9] 12/23/2006  . ANXIETY [F41.1] 12/23/2006  . DEPRESSION [F32.9] 12/23/2006  . GERD [K21.9] 12/23/2006  . MILD/UNSPEC PRE-ECLAMPSIA UNSPEC AS EPISODE CARE [IMO0002] 12/23/2006  . Fibromyalgia [M79.7] 12/23/2006   History of Present Illness:    Pt is a 36  year-old married  female who presented for  follow-up appointment. She reported that she is doing well on her medications. She reported that she has started  walking with her husband and is trying to lose weight. She has lost 110 pounds since last March when she had her surgery done. Patient reported that she is also planning to see the plastic surgeon in his office. Patient reported that she has been compliant with her medications and takes melatonin on a when necessary basis. She is planning to spend the holidays with her family. She reported that she feels depressed after the holidays and wants to come back for the follow-up appointment in January. She denied having any side effects of the medications. Her current medications are helping her. She has enough supply of the medication at this time.  Patient denied suicidal homicidal ideations or plans. She denied having any perceptual disturbances.     Past Medical History:  Past Medical History:  Diagnosis Date  . Abdominal pain, other specified site   . Anxiety state, unspecified   . Bipolar disorder, unspecified (HCC)   . Depressive disorder, not elsewhere classified   . Edema   . Esophageal reflux   . Fibromyalgia   . Headache   . History of kidney stones   . Mild or unspecified pre-eclampsia, unspecified as to episode of care   . Morbid obesity (HCC)   . Myalgia and myositis, unspecified   . Other dyspnea and respiratory abnormality   . Preeclampsia    with first pregnancy  . Raynaud's disease   . Urinary tract infection, site not specified     Past Surgical History:  Procedure Laterality Date  . CESAREAN SECTION     X 2  . CHOLECYSTECTOMY  03/2006  . CYSTOSCOPY W/ URETERAL STENT PLACEMENT Left 05/14/2016   Procedure: CYSTOSCOPY WITH STENT REPLACEMENT;  Surgeon: [name], MD;  Location: [facility];  Service: Urology;  Laterality: Left;  . CYSTOSCOPY WITH STENT PLACEMENT Left 04/25/2016   Procedure: CYSTOSCOPY WITH STENT PLACEMENT;  Surgeon: [name], MD;  Location: [facility];  Service: Urology;  Laterality: Left;  . DILATION AND CURETTAGE OF UTERUS    . IVC FILTER INSERTION   03/26/2016   [facility]  . LAPAROSCOPIC GASTRIC RESTRICTIVE DUODENAL PROCEDURE (DUODENAL SWITCH)  03/2016  . LITHOTRIPSY    . TONSILLECTOMY AND ADENOIDECTOMY    . URETEROSCOPY WITH HOLMIUM LASER LITHOTRIPSY Left 05/14/2016   Procedure: URETEROSCOPY WITH HOLMIUM LASER LITHOTRIPSY;  Surgeon: [name], MD;  Location: [facility];  Service: Urology;  Laterality: Left;   Social History:   Social History   Socioeconomic History  . Marital status: Married    Spouse name: brian  . Number of children: 2  . Years of education: None  . Highest education level: Some college, no degree  Social Needs  . Financial resource strain: Not hard at all  . Food insecurity - worry: Never true  . Food insecurity - inability: Never true  . Transportation needs - medical: No  . Transportation needs - non-medical: No  Occupational History  . Occupation: Home maker  Tobacco Use  . Smoking status: Never Smoker  . Smokeless tobacco: Never Used  Substance and Sexual Activity  . Alcohol use: Yes    Alcohol/week: 0.0 - 0.6 oz    Comment: occasional < 1 month  . Drug use: No  . Sexual activity: Yes    Partners: Male    Birth control/protection: None, IUD  Other Topics Concern  . None  Social History Narrative   1 child, 2 step-sons      Regular exercise-no   Diet: no fast food, likes sweets   Additional Social History:  Married X 11 years. Has  Two sons ages  24 and 5.  Has 2 step sons ages 31 and 70. 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  Husband is a retired Administrator, [employer] Service and has PTSD.    Musculoskeletal: Strength & Muscle Tone: within normal limits Gait & Station: normal Patient leans: N/A  Psychiatric Specialty Exam: Review of Systems  Constitutional: Positive for malaise/fatigue and weight loss.  Eyes: Negative.   Respiratory: Negative.   Cardiovascular: Negative for palpitations and leg swelling.  Gastrointestinal: Negative.   Genitourinary: Negative.    Musculoskeletal: Negative for back pain, joint pain and neck pain.  Skin: Negative for rash.  Neurological: Negative.   Psychiatric/Behavioral: Positive for depression. Negative for suicidal ideas. The patient is nervous/anxious. The patient does not have insomnia.     Blood pressure 120/79, pulse 90, temperature 98.5 F (36.9 C), temperature source Oral, weight (!) 304 lb (137.9 kg), last menstrual period 12/14/2016.Body mass index is 52.18 kg/m.  General Appearance: Casual  Eye Contact:  Fair  Speech:  Clear and Coherent  Volume:  Normal  Mood:  Anxious  Affect:  Congruent  Thought Process:  Coherent  Orientation:  Full (Time, Place, and Person)  Thought Content:  WDL  Suicidal Thoughts:  No  Homicidal Thoughts:  No  Memory:  Immediate;   Fair  Judgement:  Fair  Insight:  Fair  Psychomotor Activity:  Normal  Concentration:  Fair  Recall:  [employer] of Knowledge:Fair  Language: Fair  Akathisia:  No  Handed:  Right  AIMS (if indicated):    Assets:  Communication Skills Desire for Improvement Social Support  ADL's:  Intact  Cognition: WNL  Sleep:  Difficulty going to sleep   Is the patient at risk to self?  No. Has the patient been a risk to self in the past 6 months?  No. Has the patient been a risk to self within the distant past?  No. Is the patient a risk to others?  No. Has the patient been a risk to others in the past 6 months?  No. Has the patient been a risk to others within the distant past?  No.  Allergies:   Allergies  Allergen Reactions  . Molds & Smuts   . Pollen Extract   . Uncaria Tomentosa (Cats Claw)   . Milk-Related Compounds     Able to tolerate yogurt; mild intolerance to milk/cheese  . Pineapple   . Soy Allergy   . Strawberry Extract     Break out on tongue noted   Current Medications: Current Outpatient Medications  Medication Sig Dispense Refill  . ARIPiprazole (ABILIFY) 5 MG tablet Take 1 tablet (5 mg total) by mouth daily. 1/2  pill on M, W, F- pt has supply 90 tablet 1  . Biotin 10 MG CAPS Take 5,000 mg by mouth daily.     . calcium citrate-vitamin D 500-400 MG-UNIT chewable tablet Chew by mouth.    . cetirizine (ZYRTEC) 10 MG tablet Take by mouth.    . cholecalciferol (VITAMIN D) 1000 units tablet Take 1,000 Units by mouth daily.    . COPPER PO by Intrauterine route.    Marland Kitchen. escitalopram (LEXAPRO) 10 MG tablet Take 1 tablet (10 mg total) by mouth daily. 90 tablet 1  . lamoTRIgine (LAMICTAL) 100 MG tablet Take 1 tablet (100 mg total) by mouth daily. 90 tablet 1  . Melatonin 10 MG TABS Take 10 mg by mouth daily as needed. otc 30 tablet 1  . Multiple Vitamin (MULTIVITAMIN) tablet Take 1 tablet by mouth daily.    Marland Kitchen. NIFEdipine (PROCARDIA-XL/ADALAT CC) 30 MG 24 hr tablet Take 30 mg by mouth daily.    . norethindrone (MICRONOR,CAMILA,ERRIN) 0.35 MG tablet Take 1 tablet (0.35 mg total) by mouth daily. 3 Package 4  . omeprazole (PRILOSEC) 20 MG capsule Take 20 mg by mouth 2 (two) times daily.     No current facility-administered medications for this visit.       Substance Abuse History in the last 12 months:  No.  Consequences of Substance Abuse: NA  Medical Decision Making:  Review of Psycho-Social Stressors (1) and Established Problem, Worsening (2)  Treatment Plan Summary: Medication management   Continue medication as follows  Continue  Lexapro 10 mg daily.- Patient has supply   Continue  Abilify 2.5  mg daily- M, W, F - patient has supply.  Continue lamotrigine  100 mg daily-patient has supply  She will take melatonin at bedtime to help her sleep.     Discussed with patient about the side effects of medication. She agreed with the plan.   She will follow-up in 2 months  or earlier depending on her symptoms.  Discussed with her about the side effects of medications and she demonstrated understanding     More than 50% of the time spent in psychoeducation, counseling and coordination of care.      This note was generated in part or whole with voice recognition software. Voice regonition is usually quite accurate but there are transcription errors that can and very often do occur. I apologize for any typographical errors that were not detected and corrected.    Brandy HaleUzma Stellan Vick, MD  12/3/20189:14 AM

## 2017-02-04 ENCOUNTER — Other Ambulatory Visit: Payer: Self-pay

## 2017-02-04 ENCOUNTER — Ambulatory Visit (INDEPENDENT_AMBULATORY_CARE_PROVIDER_SITE_OTHER): Admitting: Psychiatry

## 2017-02-04 ENCOUNTER — Encounter: Payer: Self-pay | Admitting: Psychiatry

## 2017-02-04 VITALS — BP 132/83 | HR 93 | Temp 98.2°F | Wt 295.6 lb

## 2017-02-04 DIAGNOSIS — F3160 Bipolar disorder, current episode mixed, unspecified: Secondary | ICD-10-CM

## 2017-02-04 DIAGNOSIS — F411 Generalized anxiety disorder: Secondary | ICD-10-CM

## 2017-02-04 MED ORDER — LAMOTRIGINE 100 MG PO TABS: 100.0000 mg | ORAL_TABLET | Freq: Every day | ORAL | 1 refills | Status: DC

## 2017-02-04 MED ORDER — ARIPIPRAZOLE 5 MG PO TABS: 5.0000 mg | ORAL_TABLET | Freq: Every day | ORAL | 1 refills | Status: DC

## 2017-02-04 MED ORDER — ESCITALOPRAM OXALATE 10 MG PO TABS: 10.0000 mg | ORAL_TABLET | Freq: Every day | ORAL | 1 refills | Status: DC

## 2017-02-04 NOTE — Progress Notes (Signed)
Psychiatric Follow Up Notes.   Patient Identification: Sierra [name] MRN:  [id number] Date of Evaluation:  02/04/2017 [facility] Chief Complaint:    Visit Diagnosis:  Bipolar Disorder, Mixed.  Binge Eating Disorder   Diagnosis:   Patient Active Problem List   Diagnosis Date Noted  . Left ureteral calculus [N20.1] 04/25/2016  . Tingling in extremities [R20.2] 08/29/2015  . Acute nonintractable headache [R51] 08/29/2015  . Visual disturbance [H53.9] 08/23/2015  . Polydipsia [R63.1] 03/23/2014  . Binge eating disorder [F50.81] 03/23/2014  . Vaginitis and vulvovaginitis [N76.0] 12/31/2013  . Paronychia [IMO0002] 09/21/2013  . Essential hypertension, benign [I10] 06/05/2013  . Bilateral leg pain [M79.604, M79.605] 05/22/2013  . Right carpal tunnel syndrome [G56.01] 09/16/2012  . General counseling and advice on female contraception [Z30.09] 01/22/2012  . Anhydramnios [O41.00X0] 01/02/2012  . Umbilical cord complication [O69.9XX0] 12/07/2011  . High-risk pregnancy [O09.90] 11/09/2011  . Affective bipolar disorder (HCC) [F31.9] 10/12/2011  . H/O cesarean section [Z98.891] 10/12/2011  . Calculus of kidney [N20.0] 10/12/2011  . Obesity affecting pregnancy, antepartum [O99.210] 10/12/2011  . Bipolar affective disorder (HCC) [F31.9] 10/12/2011  . History of cesarean section [Z98.891] 10/12/2011  . SNORING [R06.09, R09.89] 07/23/2007  . EDEMA [R60.9] 06/11/2007  . MORBID OBESITY [E66.01] 12/23/2006  . BIPOLAR AFFECTIVE DISORDER [F31.9] 12/23/2006  . ANXIETY [F41.1] 12/23/2006  . DEPRESSION [F32.9] 12/23/2006  . GERD [K21.9] 12/23/2006  . MILD/UNSPEC PRE-ECLAMPSIA UNSPEC AS EPISODE CARE [IMO0002] 12/23/2006  . Fibromyalgia [M79.7] 12/23/2006   History of Present Illness:    Pt is a 37  year-old married  female who presented for  follow-up appointment. She is currently having severe cold and was wearing a facemask. Patient reported that she has  been feeling sick for the past few days. She enjoyed the holidays with her family members. She reported that she continues to lose weight and has lost 10 pounds in the past month. She gained a few pounds over the holidays but is steadily losing weight. She has been compliant with her medications. She wants to go higher on the dose of the Abilify as she has been feeling down and depressed over the winter and reported that Abilify has been helping her. She denied having any suicidal ideations or plans. Has good relationship with her family. We discussed about her medications in detail. She reported that she wants to continue her medications as prescribed. No acute symptoms noted at this time.    Her family is supportive. Patient sleeps well at night. She does not take any sleeping medications at this time.        Past Medical History:  Past Medical History:  Diagnosis Date  . Abdominal pain, other specified site   . Anxiety state, unspecified   . Bipolar disorder, unspecified (HCC)   . Depressive disorder, not elsewhere classified   . Edema   . Esophageal reflux   . Fibromyalgia   . Headache   . History of kidney stones   . Mild or unspecified pre-eclampsia, unspecified as to episode of care   . Morbid obesity (HCC)   . Myalgia and myositis, unspecified   . Other dyspnea and respiratory abnormality   . Preeclampsia    with first pregnancy  . Raynaud's disease   . Urinary tract infection, site not specified     Past Surgical History:  Procedure Laterality Date  . CESAREAN SECTION     X 2  . CHOLECYSTECTOMY  03/2006  . CYSTOSCOPY W/ URETERAL STENT PLACEMENT  Left 05/14/2016   Procedure: CYSTOSCOPY WITH STENT REPLACEMENT;  Surgeon: Vanna ScotlandAshley Brandon, MD;  Location: [facility] ORS;  Service: Urology;  Laterality: Left;  . CYSTOSCOPY WITH STENT PLACEMENT Left 04/25/2016   Procedure: CYSTOSCOPY WITH STENT PLACEMENT;  Surgeon: Vanna ScotlandAshley Brandon, MD;  Location: [facility];  Service: Urology;  Laterality:  Left;  . DILATION AND CURETTAGE OF UTERUS    . IVC FILTER INSERTION  03/26/2016   [facility]  . LAPAROSCOPIC GASTRIC RESTRICTIVE DUODENAL PROCEDURE (DUODENAL SWITCH)  03/2016  . LITHOTRIPSY    . TONSILLECTOMY AND ADENOIDECTOMY    . URETEROSCOPY WITH HOLMIUM LASER LITHOTRIPSY Left 05/14/2016   Procedure: URETEROSCOPY WITH HOLMIUM LASER LITHOTRIPSY;  Surgeon: Vanna ScotlandAshley Brandon, MD;  Location: [facility] ORS;  Service: Urology;  Laterality: Left;   Social History:   Social History   Socioeconomic History  . Marital status: Married    Spouse name: brian  . Number of children: 2  . Years of education: Not on file  . Highest education level: Some college, no degree  Social Needs  . Financial resource strain: Not hard at all  . Food insecurity - worry: Never true  . Food insecurity - inability: Never true  . Transportation needs - medical: No  . Transportation needs - non-medical: No  Occupational History  . Occupation: Home maker  Tobacco Use  . Smoking status: Never Smoker  . Smokeless tobacco: Never Used  Substance and Sexual Activity  . Alcohol use: Yes    Alcohol/week: 0.0 - 0.6 oz    Comment: occasional < 1 month  . Drug use: No  . Sexual activity: Yes    Partners: Male    Birth control/protection: None, IUD  Other Topics Concern  . Not on file  Social History Narrative   1 child, 2 step-sons      Regular exercise-no   Diet: no fast food, likes sweets   Additional Social History:  Married X 11 years. Has  Two sons ages  728 and 373.  Has 2 step sons ages 4623 and 4620. 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  Husband is a retired Administrator, [employer] and has PTSD.    Musculoskeletal: Strength & Muscle Tone: within normal limits Gait & Station: normal Patient leans: N/A  Psychiatric Specialty Exam: Review of Systems  Constitutional: Positive for malaise/fatigue and weight loss.  Eyes: Negative.   Respiratory: Negative.   Cardiovascular: Negative for palpitations  and leg swelling.  Gastrointestinal: Negative.   Genitourinary: Negative.   Musculoskeletal: Negative for back pain, joint pain and neck pain.  Skin: Negative for rash.  Neurological: Negative.   Psychiatric/Behavioral: Positive for depression. Negative for suicidal ideas. The patient is nervous/anxious. The patient does not have insomnia.     There were no vitals taken for this visit.There is no height or weight on file to calculate BMI.  General Appearance: Casual  Eye Contact:  Fair  Speech:  Clear and Coherent  Volume:  Normal  Mood:  Anxious  Affect:  Congruent  Thought Process:  Coherent  Orientation:  Full (Time, Place, and Person)  Thought Content:  WDL  Suicidal Thoughts:  No  Homicidal Thoughts:  No  Memory:  Immediate;   Fair  Judgement:  Fair  Insight:  Fair  Psychomotor Activity:  Normal  Concentration:  Fair  Recall:  [employer]Fair  Fund of Knowledge:Fair  Language: Fair  Akathisia:  No  Handed:  Right  AIMS (if indicated):    Assets:  Communication Skills Desire for Improvement Social Support  ADL's:  Intact  Cognition: WNL  Sleep:  Difficulty going to sleep   Is the patient at risk to self?  No. Has the patient been a risk to self in the past 6 months?  No. Has the patient been a risk to self within the distant past?  No. Is the patient a risk to others?  No. Has the patient been a risk to others in the past 6 months?  No. Has the patient been a risk to others within the distant past?  No.  Allergies:   Allergies  Allergen Reactions  . Molds & Smuts   . Pollen Extract   . Uncaria Tomentosa (Cats Claw)   . Milk-Related Compounds     Able to tolerate yogurt; mild intolerance to milk/cheese  . Pineapple   . Soy Allergy   . Strawberry Extract     Break out on tongue noted   Current Medications: Current Outpatient Medications  Medication Sig Dispense Refill  . ARIPiprazole (ABILIFY) 5 MG tablet Take 1 tablet (5 mg total) by mouth daily. 1/2 pill on M, W,  F- pt has supply 90 tablet 1  . Biotin 10 MG CAPS Take 5,000 mg by mouth daily.     . calcium citrate-vitamin D 500-400 MG-UNIT chewable tablet Chew by mouth.    . cetirizine (ZYRTEC) 10 MG tablet Take by mouth.    . COPPER PO by Intrauterine route.    Marland Kitchen escitalopram (LEXAPRO) 10 MG tablet Take 1 tablet (10 mg total) by mouth daily. 90 tablet 1  . lamoTRIgine (LAMICTAL) 100 MG tablet Take 1 tablet (100 mg total) by mouth daily. 90 tablet 1  . Melatonin 10 MG TABS Take 10 mg by mouth daily as needed. otc 30 tablet 1  . Multiple Vitamin (MULTIVITAMIN) tablet Take 1 tablet by mouth daily.    Marland Kitchen NIFEdipine (PROCARDIA-XL/ADALAT CC) 30 MG 24 hr tablet Take 30 mg by mouth daily.    . norethindrone (MICRONOR,CAMILA,ERRIN) 0.35 MG tablet Take 1 tablet (0.35 mg total) by mouth daily. 3 Package 4  . omeprazole (PRILOSEC) 20 MG capsule Take 20 mg by mouth daily.     No current facility-administered medications for this visit.       Substance Abuse History in the last 12 months:  No.  Consequences of Substance Abuse: NA  Medical Decision Making:  Review of Psycho-Social Stressors (1) and Established Problem, Worsening (2)  Treatment Plan Summary: Medication management   Continue medication as follows  Continue  Lexapro 10 mg daily.-  Continue  Abilify 5 mg daily. Continue lamotrigine  100 mg daily-   Discussed with patient about the side effects of medication. She agreed with the plan.   She will follow-up in 2 months  or earlier depending on her symptoms.  Discussed with her about the side effects of medications and she demonstrated understanding     More than 50% of the time spent in psychoeducation, counseling and coordination of care.     This note was generated in part or whole with voice recognition software. Voice regonition is usually quite accurate but there are transcription errors that can and very often do occur. I apologize for any typographical errors that were not  detected and corrected.    Brandy Hale, MD  1/21/201911:29 AM

## 2017-04-01 ENCOUNTER — Ambulatory Visit (INDEPENDENT_AMBULATORY_CARE_PROVIDER_SITE_OTHER): Admitting: Psychiatry

## 2017-04-01 ENCOUNTER — Encounter: Payer: Self-pay | Admitting: Psychiatry

## 2017-04-01 ENCOUNTER — Other Ambulatory Visit: Payer: Self-pay

## 2017-04-01 VITALS — BP 140/82 | HR 73 | Temp 97.9°F | Wt 296.2 lb

## 2017-04-01 DIAGNOSIS — F3160 Bipolar disorder, current episode mixed, unspecified: Secondary | ICD-10-CM

## 2017-04-01 DIAGNOSIS — F411 Generalized anxiety disorder: Secondary | ICD-10-CM | POA: Diagnosis not present

## 2017-04-01 MED ORDER — LAMOTRIGINE 100 MG PO TABS: 100.0000 mg | ORAL_TABLET | Freq: Every day | ORAL | 1 refills | Status: DC

## 2017-04-01 MED ORDER — ESCITALOPRAM OXALATE 10 MG PO TABS: 10.0000 mg | ORAL_TABLET | Freq: Every day | ORAL | 1 refills | Status: DC

## 2017-04-01 MED ORDER — ESZOPICLONE 2 MG PO TABS: 2.0000 mg | ORAL_TABLET | Freq: Every evening | ORAL | 1 refills | Status: DC | PRN

## 2017-04-01 MED ORDER — ARIPIPRAZOLE 5 MG PO TABS: 5.0000 mg | ORAL_TABLET | Freq: Every day | ORAL | 1 refills | Status: DC

## 2017-04-01 NOTE — Progress Notes (Signed)
Psychiatric Follow Up Notes.   Patient Identification: Sierra GrippeChristina D [name] MRN:  [id number] Date of Evaluation:  04/01/2017 [facility] Chief Complaint:   Chief Complaint    Follow-up; Medication Refill     Visit Diagnosis:  Bipolar Disorder, Mixed.  Binge Eating Disorder   Diagnosis:   Patient Active Problem List   Diagnosis Date Noted  . Left ureteral calculus [N20.1] 04/25/2016  . Tingling in extremities [R20.2] 08/29/2015  . Acute nonintractable headache [R51] 08/29/2015  . Visual disturbance [H53.9] 08/23/2015  . Polydipsia [R63.1] 03/23/2014  . Binge eating disorder [F50.81] 03/23/2014  . Vaginitis and vulvovaginitis [N76.0] 12/31/2013  . Paronychia [IMO0002] 09/21/2013  . Essential hypertension, benign [I10] 06/05/2013  . Bilateral leg pain [M79.604, M79.605] 05/22/2013  . Right carpal tunnel syndrome [G56.01] 09/16/2012  . General counseling and advice on female contraception [Z30.09] 01/22/2012  . Anhydramnios [O41.00X0] 01/02/2012  . Umbilical cord complication [O69.9XX0] 12/07/2011  . High-risk pregnancy [O09.90] 11/09/2011  . Affective bipolar disorder (HCC) [F31.9] 10/12/2011  . H/O cesarean section [Z98.891] 10/12/2011  . Calculus of kidney [N20.0] 10/12/2011  . Obesity affecting pregnancy, antepartum [O99.210] 10/12/2011  . Bipolar affective disorder (HCC) [F31.9] 10/12/2011  . History of cesarean section [Z98.891] 10/12/2011  . SNORING [R06.09, R09.89] 07/23/2007  . EDEMA [R60.9] 06/11/2007  . MORBID OBESITY [E66.01] 12/23/2006  . BIPOLAR AFFECTIVE DISORDER [F31.9] 12/23/2006  . ANXIETY [F41.1] 12/23/2006  . DEPRESSION [F32.9] 12/23/2006  . GERD [K21.9] 12/23/2006  . MILD/UNSPEC PRE-ECLAMPSIA UNSPEC AS EPISODE CARE [IMO0002] 12/23/2006  . Fibromyalgia [M79.7] 12/23/2006   History of Present Illness:    Pt is a 37  year-old married  female who presented for  follow-up appointment. She appeared somewhat tired during the  interview.  She reported that she has not been sleeping well and has nightmares about anxiety provoking subjects.  She reported that she does not feel anxious.  She has a started working an old [employer] cre[employer]avy for the past 2 weeks.  she is excited about her new job.  Her husband is also very supportive.  Her 35 year old was admitted to [facility] as he was being bullied at the school but he is doing well.  She reported that she is supportive of her child.  She reported that she has been compliant with her medications.  Patient reported that she has taken Lunesta in the past which has helped her with the sleep and she wants to start taking the same medication again.  She currently denied having any suicidal or homicidal ideations or plans.           Past Medical History:  Past Medical History:  Diagnosis Date  . Abdominal pain, other specified site   . Anxiety state, unspecified   . Bipolar disorder, unspecified (HCC)   . Depressive disorder, not elsewhere classified   . Edema   . Esophageal reflux   . Fibromyalgia   . Headache   . History of kidney stones   . Mild or unspecified pre-eclampsia, unspecified as to episode of care   . Morbid obesity (HCC)   . Myalgia and myositis, unspecified   . Other dyspnea and respiratory abnormality   . Preeclampsia    with first pregnancy  . Raynaud's disease   . Urinary tract infection, site not specified     Past Surgical History:  Procedure Laterality Date  . CESAREAN SECTION     X 2  . CHOLECYSTECTOMY  03/2006  . CYSTOSCOPY W/ URETERAL STENT PLACEMENT Left  05/14/2016   Procedure: CYSTOSCOPY WITH STENT REPLACEMENT;  Surgeon: [name], MD;  Location: [facility] OR[facility];  Service: Urology;  Laterality: Left;  . CYSTOSCOPY WITH STENT PLACEMENT Left 04/25/2016   Procedure: CYSTOSCOPY WITH STENT PLACEMENT;  Surgeon: [name], MD;  Location: [facility];  Service: Urology;  Laterality: Left;  . DILATION AND CURETTAGE OF UTERUS    . IVC FILTER  INSERTION  03/26/2016   [facility]  . LAPAROSCOPIC GASTRIC RESTRICTIVE DUODENAL PROCEDURE (DUODENAL SWITCH)  03/2016  . LITHOTRIPSY    . TONSILLECTOMY AND ADENOIDECTOMY    . URETEROSCOPY WITH HOLMIUM LASER LITHOTRIPSY Left 05/14/2016   Procedure: URETEROSCOPY WITH HOLMIUM LASER LITHOTRIPSY;  Surgeon: [name], MD;  Location: [facility] ORS;  Service: Urology;  Laterality: Left;   Social History:   Social History   Socioeconomic History  . Marital status: Married    Spouse name: brian  . Number of children: 2  . Years of education: None  . Highest education level: Some college, no degree  Social Needs  . Financial resource strain: Not hard at all  . Food insecurity - worry: Never true  . Food insecurity - inability: Never true  . Transportation needs - medical: No  . Transportation needs - non-medical: No  Occupational History  . Occupation: Home maker  Tobacco Use  . Smoking status: Never Smoker  . Smokeless tobacco: Never Used  Substance and Sexual Activity  . Alcohol use: Yes    Alcohol/week: 0.0 - 0.6 oz    Comment: occasional < 1 month  . Drug use: No  . Sexual activity: Yes    Partners: Male    Birth control/protection: None, IUD  Other Topics Concern  . None  Social History Narrative   1 child, 2 step-sons      Regular exercise-no   Diet: no fast food, likes sweets   Additional Social History:  Married X 11 years. Has  Two sons ages  10 and 46.  Has 2 step sons ages 7 and 94. 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 Husband is a retired Administrator, Civil Service and has PTSD.    Musculoskeletal: Strength & Muscle Tone: within normal limits Gait & Station: normal Patient leans: N/A  Psychiatric Specialty Exam: Review of Systems  Constitutional: Positive for malaise/fatigue and weight loss.  Eyes: Negative.   Respiratory: Negative.   Cardiovascular: Negative for palpitations and leg swelling.  Gastrointestinal: Negative.   Genitourinary: Negative.   Musculoskeletal: Negative  for back pain, joint pain and neck pain.  Skin: Negative for rash.  Neurological: Negative.   Psychiatric/Behavioral: Positive for depression. Negative for suicidal ideas. The patient is nervous/anxious. The patient does not have insomnia.     Blood pressure 140/82, pulse 73, temperature 97.9 F (36.6 C), temperature source Oral, weight 296 lb 3.2 oz (134.4 kg), last menstrual period 03/23/2017.Body mass index is 50.84 kg/m.  General Appearance: Casual  Eye Contact:  Fair  Speech:  Clear and Coherent  Volume:  Normal  Mood:  Anxious  Affect:  Congruent  Thought Process:  Coherent  Orientation:  Full (Time, Place, and Person)  Thought Content:  WDL  Suicidal Thoughts:  No  Homicidal Thoughts:  No  Memory:  Immediate;   Fair  Judgement:  Fair  Insight:  Fair  Psychomotor Activity:  Normal  Concentration:  Fair  Recall:  [employer] of Knowledge:Fair  Language: Fair  Akathisia:  No  Handed:  Right  AIMS (if indicated):    Assets:  Communication  Skills Desire for Improvement Social Support  ADL's:  Intact  Cognition: WNL  Sleep:  Difficulty going to sleep   Is the patient at risk to self?  No. Has the patient been a risk to self in the past 6 months?  No. Has the patient been a risk to self within the distant past?  No. Is the patient a risk to others?  No. Has the patient been a risk to others in the past 6 months?  No. Has the patient been a risk to others within the distant past?  No.  Allergies:   Allergies  Allergen Reactions  . Molds & Smuts   . Pollen Extract   . Uncaria Tomentosa (Cats Claw)   . Milk-Related Compounds     Able to tolerate yogurt; mild intolerance to milk/cheese  . Pineapple   . Soy Allergy   . Strawberry Extract     Break out on tongue noted   Current Medications: Current Outpatient Medications  Medication Sig Dispense Refill  . ARIPiprazole (ABILIFY) 5 MG tablet Take 1 tablet (5 mg total) by mouth daily. 90 tablet 1  . Biotin 10 MG  CAPS Take 5,000 mg by mouth daily.     . calcium citrate-vitamin D 500-400 MG-UNIT chewable tablet Chew by mouth.    . cetirizine (ZYRTEC) 10 MG tablet Take by mouth.    . COPPER PO by Intrauterine route.    Marland Kitchen escitalopram (LEXAPRO) 10 MG tablet Take 1 tablet (10 mg total) by mouth daily. 90 tablet 1  . eszopiclone (LUNESTA) 2 MG TABS tablet Take 1 tablet (2 mg total) by mouth at bedtime as needed for sleep. Take immediately before bedtime 30 tablet 1  . lamoTRIgine (LAMICTAL) 100 MG tablet Take 1 tablet (100 mg total) by mouth daily. 90 tablet 1  . Multiple Vitamin (MULTIVITAMIN) tablet Take 1 tablet by mouth daily.    Marland Kitchen NIFEdipine (PROCARDIA-XL/ADALAT CC) 30 MG 24 hr tablet Take 30 mg by mouth daily.    . norethindrone (MICRONOR,CAMILA,ERRIN) 0.35 MG tablet Take 1 tablet (0.35 mg total) by mouth daily. 3 Package 4  . omeprazole (PRILOSEC) 20 MG capsule Take 20 mg by mouth daily.     No current facility-administered medications for this visit.       Substance Abuse History in the last 12 months:  No.  Consequences of Substance Abuse: NA  Medical Decision Making:  Review of Psycho-Social Stressors (1) and Established Problem, Worsening (2)  Treatment Plan Summary: Medication management   Continue medication as follows  Continue  Lexapro 10 mg daily.-  Continue  Abilify 5 mg daily. Continue lamotrigine  100 mg daily-  I will start her on Lunesta 2 mg p.o. nightly.  Discussed with her about the side effects of the medication.  She agreed with the plan.     She will follow-up in 2 months  or earlier depending on her symptoms.  Discussed with her about the side effects of medications and she demonstrated understanding     More than 50% of the time spent in psychoeducation, counseling and coordination of care.     This note was generated in part or whole with voice recognition software. Voice regonition is usually quite accurate but there are transcription errors that can  and very often do occur. I apologize for any typographical errors that were not detected and corrected.    Brandy Hale, MD  3/18/20199:33 AM

## 2017-05-21 ENCOUNTER — Inpatient Hospital Stay
Admission: AD | Admit: 2017-05-21 | Discharge: 2017-05-24 | DRG: 885 | Disposition: A | Discharge status: Home / Self Care | Source: Intra-hospital | Attending: Psychiatry | Admitting: Psychiatry

## 2017-05-21 ENCOUNTER — Other Ambulatory Visit: Payer: Self-pay

## 2017-05-21 ENCOUNTER — Emergency Department
Admission: EM | Admit: 2017-05-21 | Discharge: 2017-05-21 | Disposition: A | Discharge status: Other Acute Inpatient Hospital | Attending: Emergency Medicine | Admitting: Emergency Medicine

## 2017-05-21 DIAGNOSIS — Z79899 Other long term (current) drug therapy: Secondary | ICD-10-CM | POA: Diagnosis not present

## 2017-05-21 DIAGNOSIS — I7300 Raynaud's syndrome without gangrene: Secondary | ICD-10-CM | POA: Diagnosis present

## 2017-05-21 DIAGNOSIS — X838XXA Intentional self-harm by other specified means, initial encounter: Secondary | ICD-10-CM | POA: Insufficient documentation

## 2017-05-21 DIAGNOSIS — M797 Fibromyalgia: Secondary | ICD-10-CM | POA: Diagnosis present

## 2017-05-21 DIAGNOSIS — F419 Anxiety disorder, unspecified: Secondary | ICD-10-CM | POA: Insufficient documentation

## 2017-05-21 DIAGNOSIS — I10 Essential (primary) hypertension: Secondary | ICD-10-CM | POA: Diagnosis present

## 2017-05-21 DIAGNOSIS — F329 Major depressive disorder, single episode, unspecified: Secondary | ICD-10-CM | POA: Insufficient documentation

## 2017-05-21 DIAGNOSIS — T43222A Poisoning by selective serotonin reuptake inhibitors, intentional self-harm, initial encounter: Secondary | ICD-10-CM | POA: Diagnosis present

## 2017-05-21 DIAGNOSIS — Z6841 Body Mass Index (BMI) 40.0 and over, adult: Secondary | ICD-10-CM

## 2017-05-21 DIAGNOSIS — Z733 Stress, not elsewhere classified: Secondary | ICD-10-CM | POA: Diagnosis not present

## 2017-05-21 DIAGNOSIS — F319 Bipolar disorder, unspecified: Secondary | ICD-10-CM | POA: Diagnosis not present

## 2017-05-21 DIAGNOSIS — F339 Major depressive disorder, recurrent, unspecified: Secondary | ICD-10-CM | POA: Diagnosis present

## 2017-05-21 DIAGNOSIS — Z818 Family history of other mental and behavioral disorders: Secondary | ICD-10-CM | POA: Diagnosis not present

## 2017-05-21 DIAGNOSIS — Z915 Personal history of self-harm: Secondary | ICD-10-CM | POA: Diagnosis not present

## 2017-05-21 DIAGNOSIS — Y92009 Unspecified place in unspecified non-institutional (private) residence as the place of occurrence of the external cause: Secondary | ICD-10-CM | POA: Insufficient documentation

## 2017-05-21 DIAGNOSIS — T391X2A Poisoning by 4-Aminophenol derivatives, intentional self-harm, initial encounter: Secondary | ICD-10-CM | POA: Diagnosis present

## 2017-05-21 DIAGNOSIS — Z91018 Allergy to other foods: Secondary | ICD-10-CM

## 2017-05-21 DIAGNOSIS — K219 Gastro-esophageal reflux disease without esophagitis: Secondary | ICD-10-CM | POA: Diagnosis present

## 2017-05-21 DIAGNOSIS — Y998 Other external cause status: Secondary | ICD-10-CM | POA: Insufficient documentation

## 2017-05-21 DIAGNOSIS — F3132 Bipolar disorder, current episode depressed, moderate: Secondary | ICD-10-CM | POA: Diagnosis present

## 2017-05-21 DIAGNOSIS — Z91011 Allergy to milk products: Secondary | ICD-10-CM | POA: Diagnosis not present

## 2017-05-21 DIAGNOSIS — T391X2D Poisoning by 4-Aminophenol derivatives, intentional self-harm, subsequent encounter: Secondary | ICD-10-CM

## 2017-05-21 DIAGNOSIS — Y939 Activity, unspecified: Secondary | ICD-10-CM | POA: Diagnosis not present

## 2017-05-21 DIAGNOSIS — F603 Borderline personality disorder: Secondary | ICD-10-CM | POA: Diagnosis present

## 2017-05-21 DIAGNOSIS — T391X1A Poisoning by 4-Aminophenol derivatives, accidental (unintentional), initial encounter: Secondary | ICD-10-CM

## 2017-05-21 DIAGNOSIS — Z91048 Other nonmedicinal substance allergy status: Secondary | ICD-10-CM | POA: Diagnosis not present

## 2017-05-21 DIAGNOSIS — T1491XA Suicide attempt, initial encounter: Secondary | ICD-10-CM | POA: Diagnosis present

## 2017-05-21 DIAGNOSIS — F411 Generalized anxiety disorder: Secondary | ICD-10-CM | POA: Diagnosis present

## 2017-05-21 HISTORY — DX: Bipolar disorder, unspecified: F31.9

## 2017-05-21 LAB — COMPREHENSIVE METABOLIC PANEL
ALT: 23 U/L (ref 14–54)
ANION GAP: 8 (ref 5–15)
AST: 50 U/L — ABNORMAL HIGH (ref 15–41)
Albumin: 4.2 g/dL (ref 3.5–5.0)
Alkaline Phosphatase: 91 U/L (ref 38–126)
BILIRUBIN TOTAL: 1.1 mg/dL (ref 0.3–1.2)
BUN: 10 mg/dL (ref 6–20)
CALCIUM: 9.1 mg/dL (ref 8.9–10.3)
CO2: 22 mmol/L (ref 22–32)
Chloride: 109 mmol/L (ref 101–111)
Creatinine, Ser: 0.92 mg/dL (ref 0.44–1.00)
GFR calc non Af Amer: >60 mL/min (ref >60)
Glucose, Bld: 96 mg/dL (ref 65–99)
Potassium: 5.6 mmol/L — ABNORMAL HIGH (ref 3.5–5.1)
SODIUM: 139 mmol/L (ref 135–145)
TOTAL PROTEIN: 7.7 g/dL (ref 6.5–8.1)

## 2017-05-21 LAB — CBC
HCT: 39.3 % (ref 35.0–47.0)
Hemoglobin: 13.3 g/dL (ref 12.0–16.0)
MCH: 28.9 pg (ref 26.0–34.0)
MCHC: 33.9 g/dL (ref 32.0–36.0)
MCV: 85.2 fL (ref 80.0–100.0)
PLATELETS: 300 10*3/uL (ref 150–440)
RBC: 4.61 MIL/uL (ref 3.80–5.20)
RDW: 16.3 % — AB (ref 11.5–14.5)
WBC: 4.7 10*3/uL (ref 3.6–11.0)

## 2017-05-21 LAB — URINE DRUG SCREEN, QUALITATIVE (ARMC ONLY)
Amphetamines, Ur Screen: NOT DETECTED
BARBITURATES, UR SCREEN: NOT DETECTED
BENZODIAZEPINE, UR SCRN: POSITIVE — AB
Cannabinoid 50 Ng, Ur ~~LOC~~: NOT DETECTED
Cocaine Metabolite,Ur ~~LOC~~: NOT DETECTED
MDMA (Ecstasy)Ur Screen: NOT DETECTED
Methadone Scn, Ur: NOT DETECTED
Opiate, Ur Screen: NOT DETECTED
Phencyclidine (PCP) Ur S: NOT DETECTED
TRICYCLIC, UR SCREEN: NOT DETECTED

## 2017-05-21 LAB — POCT PREGNANCY, URINE: PREG TEST UR: NEGATIVE

## 2017-05-21 LAB — BASIC METABOLIC PANEL
Anion gap: 6 (ref 5–15)
BUN: 15 mg/dL (ref 6–20)
CO2: 25 mmol/L (ref 22–32)
Calcium: 9.4 mg/dL (ref 8.9–10.3)
Chloride: 108 mmol/L (ref 101–111)
Creatinine, Ser: 0.84 mg/dL (ref 0.44–1.00)
GFR calc Af Amer: >60 mL/min (ref >60)
GFR calc non Af Amer: >60 mL/min (ref >60)
GLUCOSE: 113 mg/dL — AB (ref 65–99)
POTASSIUM: 3.8 mmol/L (ref 3.5–5.1)
Sodium: 139 mmol/L (ref 135–145)

## 2017-05-21 LAB — SALICYLATE LEVEL

## 2017-05-21 LAB — ACETAMINOPHEN LEVEL
ACETAMINOPHEN (TYLENOL), SERUM: 89 ug/mL — AB (ref 10–30)
Acetaminophen (Tylenol), Serum: 64 ug/mL — ABNORMAL HIGH (ref 10–30)

## 2017-05-21 LAB — ETHANOL: Alcohol, Ethyl (B): <10 mg/dL (ref <10)

## 2017-05-21 MED ORDER — HYDROXYZINE HCL 25 MG PO TABS
25.0000 mg | ORAL_TABLET | Freq: Three times a day (TID) | ORAL | Status: DC | PRN
  Filled 2017-05-21: qty 1

## 2017-05-21 MED ORDER — ARIPIPRAZOLE 5 MG PO TABS: 5.0000 mg | ORAL_TABLET | Freq: Every day | ORAL | Status: DC

## 2017-05-21 MED ORDER — LAMOTRIGINE 100 MG PO TABS: 100.0000 mg | ORAL_TABLET | Freq: Every day | ORAL | Status: DC

## 2017-05-21 MED ORDER — ESCITALOPRAM OXALATE 10 MG PO TABS
10.0000 mg | ORAL_TABLET | Freq: Every day | ORAL | Status: DC
  Administered 2017-05-22: 10 mg via ORAL
  Filled 2017-05-21: qty 1

## 2017-05-21 MED ORDER — ESCITALOPRAM OXALATE 10 MG PO TABS: 10.0000 mg | ORAL_TABLET | Freq: Every day | ORAL | Status: DC

## 2017-05-21 MED ORDER — TRAZODONE HCL 100 MG PO TABS
100.0000 mg | ORAL_TABLET | Freq: Every evening | ORAL | Status: DC | PRN
  Filled 2017-05-21 (×2): qty 1

## 2017-05-21 MED ORDER — LAMOTRIGINE 100 MG PO TABS
100.0000 mg | ORAL_TABLET | Freq: Every day | ORAL | Status: DC
  Administered 2017-05-22 – 2017-05-24 (×3): 100 mg via ORAL
  Filled 2017-05-21 (×3): qty 1

## 2017-05-21 MED ORDER — NIFEDIPINE ER 60 MG PO TB24
60.0000 mg | ORAL_TABLET | Freq: Every day | ORAL | Status: DC
  Administered 2017-05-22 – 2017-05-24 (×3): 60 mg via ORAL
  Filled 2017-05-21 (×3): qty 1

## 2017-05-21 MED ORDER — PANTOPRAZOLE SODIUM 40 MG PO TBEC: 40.0000 mg | DELAYED_RELEASE_TABLET | Freq: Every day | ORAL | Status: DC

## 2017-05-21 MED ORDER — MONTELUKAST SODIUM 10 MG PO TABS: 10.0000 mg | ORAL_TABLET | Freq: Every day | ORAL | Status: DC

## 2017-05-21 MED ORDER — ARIPIPRAZOLE 5 MG PO TABS
5.0000 mg | ORAL_TABLET | Freq: Every day | ORAL | Status: DC
  Administered 2017-05-22 – 2017-05-24 (×3): 5 mg via ORAL
  Filled 2017-05-21 (×3): qty 1

## 2017-05-21 MED ORDER — NIFEDIPINE ER 60 MG PO TB24
60.0000 mg | ORAL_TABLET | Freq: Every day | ORAL | Status: DC
  Filled 2017-05-21: qty 1

## 2017-05-21 MED ORDER — MONTELUKAST SODIUM 10 MG PO TABS
10.0000 mg | ORAL_TABLET | Freq: Every day | ORAL | Status: DC
  Administered 2017-05-21 – 2017-05-23 (×3): 10 mg via ORAL
  Filled 2017-05-21 (×3): qty 1

## 2017-05-21 MED ORDER — MAGNESIUM HYDROXIDE 400 MG/5ML PO SUSP: 30.0000 mL | Freq: Every day | ORAL | Status: DC | PRN

## 2017-05-21 MED ORDER — PANTOPRAZOLE SODIUM 40 MG PO TBEC
40.0000 mg | DELAYED_RELEASE_TABLET | Freq: Every day | ORAL | Status: DC
  Administered 2017-05-22 – 2017-05-24 (×3): 40 mg via ORAL
  Filled 2017-05-21 (×3): qty 1

## 2017-05-21 NOTE — Progress Notes (Signed)
Nursing note 7p-7a  Pt observed interacting with peers on unit this shift. Displayed a flat affect and sullen mood upon interaction with this Clinical research associate. Pt denies pain as well as any SI/HI. Pt also denies audio or visual hallucinations at this time.  Pt continues to remain safe on the unit and is observed by rounding every 15 min. Pt able to contract for safety. Pt is now resting in bed with eyes closed. RN will continue to monitor.

## 2017-05-21 NOTE — ED Triage Notes (Signed)
Pt comes into the ED via EMS from home with c/o suicide attempt by taking 13 savella,100mg ,  16 tylenol , 12-16oz of wine/liquor around 730am today. Pt is a/o x4 on arrival.

## 2017-05-21 NOTE — Progress Notes (Signed)
37 year old female admitted to unit. Alert and oriented x4.  Currently denies SI/HI/AVH at present time. Patient reports that current stressors are her financial issues, marital discord, (including discord with her boyfriend) and family issues. Oriented patient to room and unit. Skin and contraband search completed and witnessed by [facility], Charity fundraiser. Bruise noted to L) shen observed, skin otherwise warm, dry and intact. No contraband found.  Admission assessment completed, fluid and nutrition offered. Patient remains safe on the unit with q 15 minute checks.

## 2017-05-21 NOTE — Plan of Care (Signed)
Pt continues to progress towards d/c

## 2017-05-21 NOTE — BH Assessment (Signed)
Patient is to be admitted to [facility] by Dr. Toni Amend.  Attending Physician will be Dr. Johnella Moloney.   Patient has been assigned to room 319-A, by [facility] Charge Nurse T'Yawn.   Intake Paper Work has been signed and placed on patient chart.  ER staff is aware of the admission:  [facility]   Dr. Alphonzo Lemmings, ER MD   Victorino Dike Patient's Nurse   Ethelene Browns Patient Access.

## 2017-05-21 NOTE — Consult Note (Signed)
[facility] Consult   Reason for Consult: Consult for 37 year old woman who came into the hospital after an acute overdose Referring Physician: Burlene Arnt Patient Identification: Sierra [name] MRN:  [id number] Principal Diagnosis: Acetaminophen overdose Diagnosis:   Patient Active Problem List   Diagnosis Date Noted  . Acetaminophen overdose [T39.1X1A] 05/21/2017  . Left ureteral calculus [N20.1] 04/25/2016  . Tingling in extremities [R20.2] 08/29/2015  . Acute nonintractable headache [R51] 08/29/2015  . Visual disturbance [H53.9] 08/23/2015  . Polydipsia [R63.1] 03/23/2014  . Binge eating disorder [F50.81] 03/23/2014  . Vaginitis and vulvovaginitis [N76.0] 12/31/2013  . Paronychia [IMO0002] 09/21/2013  . Essential hypertension, benign [I10] 06/05/2013  . Bilateral leg pain [M79.604, M79.605] 05/22/2013  . Right carpal tunnel syndrome [G56.01] 09/16/2012  . General counseling and advice on female contraception [Z30.09] 01/22/2012  . Anhydramnios [O41.00X0] 01/02/2012  . Umbilical cord complication [G99.2EQ6] 83/41/9622  . High-risk pregnancy [O09.90] 11/09/2011  . Affective bipolar disorder ([place]) [F31.9] 10/12/2011  . H/O cesarean section [Z98.891] 10/12/2011  . Calculus of kidney [N20.0] 10/12/2011  . Obesity affecting pregnancy, antepartum [O99.210] 10/12/2011  . Bipolar affective disorder ([place]) [F31.9] 10/12/2011  . History of cesarean section [Z98.891] 10/12/2011  . SNORING [R06.09, R09.89] 07/23/2007  . EDEMA [R60.9] 06/11/2007  . Morbid obesity ([place]) [E66.01] 12/23/2006  . BIPOLAR AFFECTIVE DISORDER [F31.9] 12/23/2006  . ANXIETY [F41.1] 12/23/2006  . DEPRESSION [F32.9] 12/23/2006  . GERD [K21.9] 12/23/2006  . MILD/UNSPEC PRE-ECLAMPSIA UNSPEC AS EPISODE CARE [IMO0002] 12/23/2006  . Fibromyalgia [M79.7] 12/23/2006    Total Time spent with patient: 1 hour  Subjective:   Sierra [name] is a 37 y.o. female patient admitted with "it was a stupid  mistake".  HPI: Patient interviewed chart reviewed.  37 year old woman came to the hospital last night admitting that she had taken an overdose of pills.  These included Tylenols as well as her psychiatric medicine particularly an old prescription of [place].  She was also drinking at the time probably more wine than usual.  The acute stress was that a man she was interested in and stopped returning her messages.  Patient says she regretted taking the overdose almost immediately.  Told her husband about it and he had her brought to the hospital.  Patient claims that her mood prior to this had actually been pretty good.  In fact probably a little better than usual and she has not been sleeping well.  She denies that she has been having any hallucinations.  Denies that she had been aware of any suicidal thoughts.  She says that she has been compliant with her psychiatric and medical medications.  Social history: Married with children.  She does not work outside the home.  Medical history: Overweight and status post a gastric bypass.  History of gastric reflux and allergies and Raynaud's syndrome  Substance abuse history: She says she has been drinking wine more than usual for the last couple weeks.  At least 2 glasses most nights.  Denies that she is using any other drugs.  Past Psychiatric History: Patient has had psychiatric hospitalizations before last time in 2008.  She has a diagnosis of bipolar disorder.  Sees Dr. Gretel Acre and has a therapist.  Prior medicines include Depakote Zoloft and Paxil and others.  Denies ever trying to kill herself in the past  Risk to Self: Suicidal Ideation: No-Not Currently/Within Last 6 Months Suicidal Intent: No-Not Currently/Within Last 6 Months Is patient at risk for suicide?: Yes Suicidal Plan?: No-Not Currently/Within Last 6 Months  Access to Means: Yes Specify Access to Suicidal Means: Overdose on medication What has been your use of drugs/alcohol within the  last 12 months?: 2 glasses of wine daily How many times?: 0 Other Self Harm Risks: n/a Triggers for Past Attempts: None known Intentional Self Injurious Behavior: None Risk to Others: Homicidal Ideation: No Thoughts of Harm to Others: No Current Homicidal Intent: No Current Homicidal Plan: No Access to Homicidal Means: No History of harm to others?: No Assessment of Violence: None Noted Violent Behavior Description: n/a Does patient have access to weapons?: Yes (Comment) Criminal Charges Pending?: No Does patient have a court date: No Prior Inpatient Therapy: Prior Inpatient Therapy: Yes Prior Therapy Dates: 2008 Prior Therapy Facilty/Provider(s): [facility] Reason for Treatment: Bipolar Prior Outpatient Therapy: Prior Outpatient Therapy: Yes Prior Therapy Dates: current Prior Therapy Facilty/Provider(s): [facility] Reason for Treatment: Bipolar Does patient have an ACCT team?: No Does patient have Intensive In-House Services?  : No Does patient have [facility] services? : No Does patient have P4CC services?: No  Past Medical History:  Past Medical History:  Diagnosis Date  . Abdominal pain, other specified site   . Anxiety state, unspecified   . Bipolar disorder, unspecified ([place])   . Depressive disorder, not elsewhere classified   . Edema   . Esophageal reflux   . Fibromyalgia   . Headache   . History of kidney stones   . Mild or unspecified pre-eclampsia, unspecified as to episode of care   . Morbid obesity ([place])   . Myalgia and myositis, unspecified   . Other dyspnea and respiratory abnormality   . Preeclampsia    with first pregnancy  . Raynaud's disease   . Urinary tract infection, site not specified     Past Surgical History:  Procedure Laterality Date  . CESAREAN SECTION     X 2  . CHOLECYSTECTOMY  03/2006  . CYSTOSCOPY W/ URETERAL STENT PLACEMENT Left 05/14/2016   Procedure: CYSTOSCOPY WITH STENT REPLACEMENT;  Surgeon: Hollice Espy, MD;  Location:  [facility];  Service: Urology;  Laterality: Left;  . CYSTOSCOPY WITH STENT PLACEMENT Left 04/25/2016   Procedure: CYSTOSCOPY WITH STENT PLACEMENT;  Surgeon: Hollice Espy, MD;  Location: [facility];  Service: Urology;  Laterality: Left;  . DILATION AND CURETTAGE OF UTERUS    . IVC FILTER INSERTION  03/26/2016   [facility] DUODENAL PROCEDURE (DUODENAL SWITCH)  03/2016  . LITHOTRIPSY    . TONSILLECTOMY AND ADENOIDECTOMY    . URETEROSCOPY WITH HOLMIUM LASER LITHOTRIPSY Left 05/14/2016   Procedure: URETEROSCOPY WITH HOLMIUM LASER LITHOTRIPSY;  Surgeon: Hollice Espy, MD;  Location: [facility];  Service: Urology;  Laterality: Left;   Family History:  Family History  Problem Relation Age of Onset  . Depression Father   . Alcohol abuse Father   . Depression Mother   . Hyperlipidemia Mother   . Sleep apnea Mother   . Depression Sister   . Hyperlipidemia Brother   . Depression Brother   . Hyperlipidemia Brother   . Depression Brother   . Alcohol abuse Brother   . Coronary artery disease Maternal Grandmother   . Heart attack Maternal Grandmother   . Diabetes Maternal Grandmother   . Lung cancer Maternal Grandfather   . Emphysema Maternal Grandfather   . Coronary artery disease Maternal Grandfather   . Lupus Unknown        Aunt  . Fibromyalgia Unknown        Aunt  Family Psychiatric  History: Mother had depression.  Father killed himself and had alcohol problems Social History:  Social History   Substance and Sexual Activity  Alcohol Use Yes  . Alcohol/week: 0.0 - 0.6 oz   Comment: occasional < 1 month     Social History   Substance and Sexual Activity  Drug Use No    Social History   Socioeconomic History  . Marital status: Married    Spouse name: brian  . Number of children: 2  . Years of education: Not on file  . Highest education level: Some college, no degree  Occupational History  . Occupation: Materials engineer  Social Needs  .  Financial resource strain: Not hard at all  . Food insecurity:    Worry: Never true    Inability: Never true  . Transportation needs:    Medical: No    Non-medical: No  Tobacco Use  . Smoking status: Never Smoker  . Smokeless tobacco: Never Used  Substance and Sexual Activity  . Alcohol use: Yes    Alcohol/week: 0.0 - 0.6 oz    Comment: occasional < 1 month  . Drug use: No  . Sexual activity: Yes    Partners: Male    Birth control/protection: None, IUD  Lifestyle  . Physical activity:    Days per week: 0 days    Minutes per session: 0 min  . Stress: To some extent  Relationships  . Social connections:    Talks on phone: More than three times a week    Gets together: Once a week    Attends religious service: Never    Active member of club or organization: No    Attends meetings of clubs or organizations: Never    Relationship status: Married  Other Topics Concern  . Not on file  Social History Narrative   1 child, 2 step-sons      Regular exercise-no   Diet: no fast food, likes sweets   Additional Social History:    Allergies:   Allergies  Allergen Reactions  . Molds & Smuts   . Pollen Extract   . Uncaria Tomentosa ([place] Claw)   . Milk-Related Compounds     Able to tolerate yogurt; mild intolerance to milk/cheese  . Pineapple   . Soy Allergy   . Strawberry Extract     Break out on tongue noted    Labs:  Results for orders placed or performed during the hospital encounter of 05/21/17 (from the past 48 hour(s))  Comprehensive metabolic panel     Status: Abnormal   Collection Time: 05/21/17  8:42 AM  Result Value Ref Range   Sodium 139 135 - 145 mmol/L   Potassium 5.6 (H) 3.5 - 5.1 mmol/L    Comment: HEMOLYSIS AT THIS LEVEL MAY AFFECT RESULT   Chloride 109 101 - 111 mmol/L   CO2 22 22 - 32 mmol/L   Glucose, Bld 96 65 - 99 mg/dL   BUN 10 6 - 20 mg/dL   Creatinine, Ser 0.92 0.44 - 1.00 mg/dL   Calcium 9.1 8.9 - 10.3 mg/dL   Total Protein 7.7 6.5 - 8.1  g/dL   Albumin 4.2 3.5 - 5.0 g/dL   AST 50 (H) 15 - 41 U/L    Comment: HEMOLYSIS AT THIS LEVEL MAY AFFECT RESULT   ALT 23 14 - 54 U/L    Comment: HEMOLYSIS AT THIS LEVEL MAY AFFECT RESULT   Alkaline Phosphatase 91 38 - 126 U/L  Total Bilirubin 1.1 0.3 - 1.2 mg/dL    Comment: HEMOLYSIS AT THIS LEVEL MAY AFFECT RESULT   GFR calc non Af Amer >60 >60 mL/min   GFR calc Af Amer >60 >60 mL/min    Comment: (NOTE) The eGFR has been calculated using the CKD EPI equation. This calculation has not been validated in all clinical situations. eGFR's persistently <60 mL/min signify possible Chronic Kidney Disease.    Anion gap 8 5 - 15    Comment: Performed at [facility], [address] [place], [place] [address]  Ethanol     Status: None   Collection Time: 05/21/17  8:42 AM  Result Value Ref Range   Alcohol, Ethyl (B) <10 <10 mg/dL    Comment:        LOWEST DETECTABLE LIMIT FOR SERUM ALCOHOL IS 10 mg/dL FOR MEDICAL PURPOSES ONLY Performed at [facility], [address] [place], [place] [address]   Salicylate level     Status: None   Collection Time: 05/21/17  8:42 AM  Result Value Ref Range   Salicylate Lvl <3.1 2.8 - 30.0 mg/dL    Comment: Performed at [facility], [address] [place], [place] [address]  Acetaminophen level     Status: Abnormal   Collection Time: 05/21/17  8:42 AM  Result Value Ref Range   Acetaminophen (Tylenol), Serum 89 (H) 10 - 30 ug/mL    Comment:        THERAPEUTIC CONCENTRATIONS VARY SIGNIFICANTLY. A RANGE OF 10-30 ug/mL MAY BE AN EFFECTIVE CONCENTRATION FOR MANY PATIENTS. HOWEVER, SOME ARE BEST TREATED AT CONCENTRATIONS OUTSIDE THIS RANGE. ACETAMINOPHEN CONCENTRATIONS >150 ug/mL AT 4 HOURS AFTER INGESTION AND >50 ug/mL AT 12 HOURS AFTER INGESTION ARE OFTEN ASSOCIATED WITH TOXIC REACTIONS. Performed at [facility], [address] [place], [place] [address]   cbc     Status: Abnormal   Collection  Time: 05/21/17  8:42 AM  Result Value Ref Range   WBC 4.7 3.6 - 11.0 K/uL   RBC 4.61 3.80 - 5.20 MIL/uL   Hemoglobin 13.3 12.0 - 16.0 g/dL   HCT 39.3 35.0 - 47.0 %   MCV 85.2 80.0 - 100.0 fL   MCH 28.9 26.0 - 34.0 pg   MCHC 33.9 32.0 - 36.0 g/dL   RDW 16.3 (H) 11.5 - 14.5 %   Platelets 300 150 - 440 K/uL    Comment: Performed at [facility], [address] [place], [place] [address]  Urine Drug Screen, Qualitative     Status: Abnormal   Collection Time: 05/21/17 10:37 AM  Result Value Ref Range   Tricyclic, Ur Screen NONE DETECTED NONE DETECTED   Amphetamines, Ur Screen NONE DETECTED NONE DETECTED   MDMA (Ecstasy)Ur Screen NONE DETECTED NONE DETECTED   Cocaine Metabolite,Ur [place] NONE DETECTED NONE DETECTED   Opiate, Ur Screen NONE DETECTED NONE DETECTED   Phencyclidine (PCP) Ur S NONE DETECTED NONE DETECTED   Cannabinoid 50 Ng, Ur [place] NONE DETECTED NONE DETECTED   Barbiturates, Ur Screen NONE DETECTED NONE DETECTED   Benzodiazepine, Ur Scrn POSITIVE (A) NONE DETECTED   Methadone Scn, Ur NONE DETECTED NONE DETECTED    Comment: (NOTE) Tricyclics + metabolites, urine    Cutoff 1000 ng/mL Amphetamines + metabolites, urine  Cutoff 1000 ng/mL MDMA (Ecstasy), urine              Cutoff 500 ng/mL Cocaine Metabolite, urine          Cutoff 300 ng/mL  Opiate + metabolites, urine        Cutoff 300 ng/mL Phencyclidine (PCP), urine         Cutoff 25 ng/mL Cannabinoid, urine                 Cutoff 50 ng/mL Barbiturates + metabolites, urine  Cutoff 200 ng/mL Benzodiazepine, urine              Cutoff 200 ng/mL Methadone, urine                   Cutoff 300 ng/mL The urine drug screen provides only a preliminary, unconfirmed analytical test result and should not be used for non-medical purposes. Clinical consideration and professional judgment should be applied to any positive drug screen result due to possible interfering substances. A more specific alternate chemical method must be  used in order to obtain a confirmed analytical result. Gas chromatography / mass spectrometry (GC/MS) is the preferred confirmat ory method. Performed at [facility], [address] [place], [place] [address]   Acetaminophen level     Status: Abnormal   Collection Time: 05/21/17 10:37 AM  Result Value Ref Range   Acetaminophen (Tylenol), Serum 64 (H) 10 - 30 ug/mL    Comment:        THERAPEUTIC CONCENTRATIONS VARY SIGNIFICANTLY. A RANGE OF 10-30 ug/mL MAY BE AN EFFECTIVE CONCENTRATION FOR MANY PATIENTS. HOWEVER, SOME ARE BEST TREATED AT CONCENTRATIONS OUTSIDE THIS RANGE. ACETAMINOPHEN CONCENTRATIONS >150 ug/mL AT 4 HOURS AFTER INGESTION AND >50 ug/mL AT 12 HOURS AFTER INGESTION ARE OFTEN ASSOCIATED WITH TOXIC REACTIONS. Performed at [facility], [address] [place], [place] [address]   Pregnancy, urine POC     Status: None   Collection Time: 05/21/17 10:50 AM  Result Value Ref Range   Preg Test, Ur NEGATIVE NEGATIVE    Comment:        THE SENSITIVITY OF THIS METHODOLOGY IS >24 mIU/mL     Current Facility-Administered Medications  Medication Dose Route Frequency Provider Last Rate Last Dose  . ARIPiprazole (ABILIFY) tablet 5 mg  5 mg Oral Daily Damonte Frieson T, MD      . escitalopram (LEXAPRO) tablet 10 mg  10 mg Oral Daily Virda Betters T, MD      . lamoTRIgine (LAMICTAL) tablet 100 mg  100 mg Oral Daily Reece Fehnel T, MD      . montelukast (SINGULAIR) tablet 10 mg  10 mg Oral QHS Mekel Haverstock T, MD      . NIFEdipine (PROCARDIA-XL/ADALAT CC) 24 hr tablet 60 mg  60 mg Oral Daily Margaretha Mahan T, MD      . pantoprazole (PROTONIX) EC tablet 40 mg  40 mg Oral Daily Katricia Prehn, Madie Reno, MD       Current Outpatient Medications  Medication Sig Dispense Refill  . acetaminophen (TYLENOL) 500 MG tablet Take 500 mg by mouth every 6 (six) hours as needed for mild pain.    . ARIPiprazole (ABILIFY) 5 MG tablet Take 1 tablet (5 mg total) by mouth daily. 90  tablet 1  . calcium citrate-vitamin D 500-400 MG-UNIT chewable tablet Chew 1 tablet by mouth daily.     . cetirizine (ZYRTEC) 10 MG tablet Take 10 mg by mouth daily.     Marland Kitchen escitalopram (LEXAPRO) 10 MG tablet Take 1 tablet (10 mg total) by mouth daily. 90 tablet 1  . eszopiclone (LUNESTA) 2 MG TABS tablet Take 1 tablet (2 mg total) by mouth at bedtime as  needed for sleep. Take immediately before bedtime 30 tablet 1  . lamoTRIgine (LAMICTAL) 100 MG tablet Take 1 tablet (100 mg total) by mouth daily. 90 tablet 1  . Milnacipran HCl (SAVELLA) 100 MG TABS tablet Take 100 mg by mouth 2 (two) times daily.    . montelukast (SINGULAIR) 10 MG tablet Take 10 mg by mouth daily.    . Multiple Vitamin (MULTIVITAMIN) tablet Take 1 tablet by mouth daily.    Marland Kitchen NIFEdipine (PROCARDIA-XL/ADALAT CC) 60 MG 24 hr tablet Take 60 mg by mouth daily.     . norethindrone (MICRONOR,CAMILA,ERRIN) 0.35 MG tablet Take 1 tablet (0.35 mg total) by mouth daily. 3 Package 4  . omeprazole (PRILOSEC) 20 MG capsule Take 20 mg by mouth daily.    Marland Kitchen Specialty Vitamins Products (BIOTIN PLUS KERATIN PO) Take 1 tablet by mouth daily.    . COPPER PO by Intrauterine route.      Musculoskeletal: Strength & Muscle Tone: within normal limits Gait & Station: normal Patient leans: N/A  Psychiatric Specialty Exam: Physical Exam  Nursing note and vitals reviewed. Constitutional: She appears well-developed and well-nourished.  HENT:  Head: Normocephalic and atraumatic.  Eyes: Pupils are equal, round, and reactive to light. Conjunctivae are normal.  Neck: Normal range of motion.  Cardiovascular: Regular rhythm and normal heart sounds.  Respiratory: Effort normal. No respiratory distress.  GI: Soft.  Musculoskeletal: Normal range of motion.  Neurological: She is alert.  Skin: Skin is warm and dry.  Psychiatric: Her speech is delayed. She is slowed. Cognition and memory are impaired. She expresses impulsivity. She exhibits a depressed  mood. She expresses suicidal ideation. She expresses suicidal plans.    Review of Systems  Constitutional: Negative.   HENT: Negative.   Eyes: Negative.   Respiratory: Negative.   Cardiovascular: Negative.   Gastrointestinal: Negative.   Musculoskeletal: Negative.   Skin: Negative.   Neurological: Negative.   Psychiatric/Behavioral: Positive for depression and suicidal ideas. Negative for hallucinations, memory loss and substance abuse. The patient is nervous/anxious and has insomnia.     Blood pressure 120/73, pulse (!) 108, temperature 98.2 F (36.8 C), temperature source Oral, resp. rate 18, height _0  (1.626 m), weight 285 lb (129.3 kg), last menstrual period 04/22/2017, SpO2 97 %.Body mass index is 48.92 kg/m.  General Appearance: Casual  Eye Contact:  Minimal  Speech:  Slow  Volume:  Decreased  Mood:  Depressed  Affect:  Blunt  Thought Process:  Goal Directed  Orientation:  Full (Time, Place, and Person)  Thought Content:  Logical  Suicidal Thoughts:  Yes.  with intent/plan  Homicidal Thoughts:  No  Memory:  Immediate;   Fair Recent;   Fair Remote;   Fair  Judgement:  Impaired  Insight:  Shallow  Psychomotor Activity:  Normal  Concentration:  Concentration: Fair  Recall:  [employer] of Knowledge:  Fair  Language:  Fair  Akathisia:  No  Handed:  Right  AIMS (if indicated):     Assets:  Desire for Improvement Financial Resources/Insurance Housing Resilience Social Support  ADL's:  Intact  Cognition:  WNL  Sleep:        Treatment Plan Summary: Daily contact with patient to assess and evaluate symptoms and progress in treatment, Medication management and Plan Patient's acetaminophen levels were 80 at the highest end of been going down.  She has been medically cleared by the emergency room staff.  Not thought to need further medical treatment for the acetaminophen.  Patient does  have an elevated potassium and I will recheck that.  Otherwise restarting her usual  outpatient medicines.  Continue involuntary commitment.  Admit to psychiatric unit for further stabilization of bipolar disorder possibly mixed or manic episode  Disposition: Recommend psychiatric Inpatient admission when medically cleared. Supportive therapy provided about ongoing stressors.  Alethia Berthold, MD 05/21/2017 3:02 PM

## 2017-05-21 NOTE — Tx Team (Signed)
Initial Treatment Plan 05/21/2017 5:04 PM PATRECIA Sierra [name] WUJ:[id number]    PATIENT STRESSORS: Health problems Loss of relationship with boyfriend Marital or family conflict   PATIENT STRENGTHS: Ability for insight Motivation for treatment/growth   PATIENT IDENTIFIED PROBLEMS: Depression  Marital problems  Financial issues                 DISCHARGE CRITERIA:  Adequate post-discharge living arrangements Improved stabilization in mood, thinking, and/or behavior  PRELIMINARY DISCHARGE PLAN: Outpatient therapy Participate in family therapy  PATIENT/FAMILY INVOLVEMENT: This treatment plan has been presented to and reviewed with the patient, HARA MILHOLLAND, and/or family member.  The patient and family have been given the opportunity to ask questions and make suggestions.  Jim Desanctis Dajion Bickford, RN 05/21/2017, 5:04 PM

## 2017-05-21 NOTE — Plan of Care (Signed)
Patient newly admitted, goal has not been met at this time.

## 2017-05-21 NOTE — ED Notes (Signed)
Pts earrings placed in a cup with label on it and placed in belongings bag.

## 2017-05-21 NOTE — ED Provider Notes (Addendum)
Sierra [name]   [facility] Emergency Department Provider Note  ____________________________________________   I have reviewed the triage vital signs and the nursing notes. Where available I have reviewed prior notes and, if possible and indicated, outside hospital notes.    HISTORY  Chief Complaint Suicidal    HPI Sierra [name] is a 37 y.o. female presents today complaining of feeling suicidal.  Patient took  13 100 mg Savella tablets at 7 AM after taking 16 500 mg Tylenol tablets at 6:30 AM, husband called 911.  He has not vomited, she states she is been depressed, but she will refuses to elucidate why she denies being abused at home.  She denies any other suicide attempts in the past, she denied taking any other medications.  Did also drink alcohol.  Has been feeling depressed for "not too long".  No alleviating or aggravating symptoms no prior treatment     Past Medical History:  Diagnosis Date  . Abdominal pain, other specified site   . Anxiety state, unspecified   . Bipolar disorder, unspecified (HCC)   . Depressive disorder, not elsewhere classified   . Edema   . Esophageal reflux   . Fibromyalgia   . Headache   . History of kidney stones   . Mild or unspecified pre-eclampsia, unspecified as to episode of care   . Morbid obesity (HCC)   . Myalgia and myositis, unspecified   . Other dyspnea and respiratory abnormality   . Preeclampsia    with first pregnancy  . Raynaud's disease   . Urinary tract infection, site not specified     Patient Active Problem List   Diagnosis Date Noted  . Left ureteral calculus 04/25/2016  . Tingling in extremities 08/29/2015  . Acute nonintractable headache 08/29/2015  . Visual disturbance 08/23/2015  . Polydipsia 03/23/2014  . Binge eating disorder 03/23/2014  . Vaginitis and vulvovaginitis 12/31/2013  . Paronychia 09/21/2013  . Essential hypertension, benign 06/05/2013  . Bilateral leg pain 05/22/2013  .  Right carpal tunnel syndrome 09/16/2012  . General counseling and advice on female contraception 01/22/2012  . Anhydramnios 01/02/2012  . Umbilical cord complication 12/07/2011  . High-risk pregnancy 11/09/2011  . 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 H/O cesarean section 10/12/2011  . Calculus of kidney 10/12/2011  . Obesity affecting pregnancy, antepartum 10/12/2011  . Bipolar affective disorder (HCC) 10/12/2011  . History of cesarean section 10/12/2011  . SNORING 07/23/2007  . EDEMA 06/11/2007  . MORBID OBESITY 12/23/2006  . BIPOLAR AFFECTIVE DISORDER 12/23/2006  . ANXIETY 12/23/2006  . DEPRESSION 12/23/2006  . GERD 12/23/2006  . MILD/UNSPEC PRE-ECLAMPSIA UNSPEC AS EPISODE CARE 12/23/2006  . Fibromyalgia 12/23/2006    Past Surgical History:  Procedure Laterality Date  . CESAREAN SECTION     X 2  . CHOLECYSTECTOMY  03/2006  . CYSTOSCOPY W/ URETERAL STENT PLACEMENT Left 05/14/2016   Procedure: CYSTOSCOPY WITH STENT REPLACEMENT;  Surgeon: [name], MD;  Location: [facility] ORS;  Service: Urology;  Laterality: Left;  . CYSTOSCOPY WITH STENT PLACEMENT Left 04/25/2016   Procedure: CYSTOSCOPY WITH STENT PLACEMENT;  Surgeon: [name], MD;  Location: [facility] ORS;  Service: Urology;  Laterality: Left;  . DILATION AND CURETTAGE OF UTERUS    . IVC FILTER INSERTION  03/26/2016   [facility]  . LAPAROSCOPIC GASTRIC RESTRICTIVE DUODENAL PROCEDURE (DUODENAL SWITCH)  03/2016  . LITHOTRIPSY    . TONSILLECTOMY AND ADENOIDECTOMY    . URETEROSCOPY WITH HOLMIUM LASER LITHOTRIPSY Left 05/14/2016   Procedure:  URETEROSCOPY WITH HOLMIUM LASER LITHOTRIPSY;  Surgeon: [name], MD;  Location: [facility] ORS;  Service: Urology;  Laterality: Left;    Prior to Admission medications   Medication Sig Start Date End Date Taking? Authorizing Provider  ARIPiprazole (ABILIFY) 5 MG tablet Take 1 tablet (5 mg total) by mouth daily. 04/01/17   Brandy Hale, MD  Biotin 10 MG CAPS Take 5,000 mg by mouth  daily.     [provider]  calcium citrate-vitamin D 500-400 MG-UNIT chewable tablet Chew by mouth.    [provider]  cetirizine (ZYRTEC) 10 MG tablet Take by mouth.    [provider]  COPPER PO by Intrauterine route.    [provider]  escitalopram (LEXAPRO) 10 MG tablet Take 1 tablet (10 mg total) by mouth daily. 04/01/17   Brandy Hale, MD  eszopiclone (LUNESTA) 2 MG TABS tablet Take 1 tablet (2 mg total) by mouth at bedtime as needed for sleep. Take immediately before bedtime 04/01/17   Brandy Hale, MD  lamoTRIgine (LAMICTAL) 100 MG tablet Take 1 tablet (100 mg total) by mouth daily. 04/01/17   Brandy Hale, MD  Multiple Vitamin (MULTIVITAMIN) tablet Take 1 tablet by mouth daily.    [provider]  NIFEdipine (PROCARDIA-XL/ADALAT CC) 30 MG 24 hr tablet Take 30 mg by mouth daily.    [provider]  norethindrone (MICRONOR,CAMILA,ERRIN) 0.35 MG tablet Take 1 tablet (0.35 mg total) by mouth daily. 12/11/16   Genia Del, MD  omeprazole (PRILOSEC) 20 MG capsule Take 20 mg by mouth daily.    [provider]    Allergies Molds & smuts; Pollen extract; Uncaria tomentosa (cats claw); Milk-related compounds; Pineapple; Soy allergy; and Strawberry extract  Family History  Problem Relation Age of Onset  . Depression Father   . Alcohol abuse Father   . Depression Mother   . Hyperlipidemia Mother   . Sleep apnea Mother   . Depression Sister   . Hyperlipidemia Brother   . Depression Brother   . Hyperlipidemia Brother   . Depression Brother   . Alcohol abuse Brother   . Coronary artery disease Maternal Grandmother   . Heart attack Maternal Grandmother   . Diabetes Maternal Grandmother   . Lung cancer Maternal Grandfather   . Emphysema Maternal Grandfather   . Coronary artery disease Maternal Grandfather   . Lupus Unknown        Aunt  . Fibromyalgia Unknown        Aunt    Social History Social History   Tobacco  Use  . Smoking status: Never Smoker  . Smokeless tobacco: Never Used  Substance Use Topics  . Alcohol use: Yes    Alcohol/week: 0.0 - 0.6 oz    Comment: occasional < 1 month  . Drug use: No    Review of Systems Constitutional: No fever/chills Eyes: No visual changes. ENT: No sore throat. No stiff neck no neck pain Cardiovascular: Denies chest pain. Respiratory: Denies shortness of breath. Gastrointestinal:   no vomiting.  No diarrhea.  No constipation. Genitourinary: Negative for dysuria. Musculoskeletal: Negative lower extremity swelling Skin: Negative for rash. Neurological: Negative for severe headaches, focal weakness or numbness.   ____________________________________________   PHYSICAL EXAM:  VITAL SIGNS: ED Triage Vitals  Enc Vitals Group     BP 05/21/17 0836 (!) 145/84     Pulse Rate 05/21/17 0836 (!) 118     Resp 05/21/17 0836 17     Temp 05/21/17 0836 98.2 F (36.8 C)  Temp Source 05/21/17 0836 Oral     SpO2 05/21/17 0836 100 %     Weight 05/21/17 0837 285 lb (129.3 kg)     Height 05/21/17 0837  (1.626 m)     Head Circumference --      Peak Flow --      Pain Score 05/21/17 0837 0     Pain Loc --      Pain Edu? --      Excl. in GC? --     Constitutional: Alert and oriented. Well appearing and in no acute distress physically, patient is however emotionally somewhat distraught Eyes: Conjunctivae are normal Head: Atraumatic HEENT: No congestion/rhinnorhea. Mucous membranes are moist.  Oropharynx non-erythematous Neck:   Nontender with no meningismus, no masses, no stridor Cardiovascular: Tachycardia noted, regular rhythm. Grossly normal heart sounds.  Good peripheral circulation. Respiratory: Normal respiratory effort.  No retractions. Lungs CTAB. Abdominal: Soft and nontender. No distention. No guarding no rebound Back:  There is no focal tenderness or step off.  there is no midline tenderness there are no lesions noted. there is no CVA  tenderness Musculoskeletal: No lower extremity tenderness, no upper extremity tenderness. No joint effusions, no DVT signs strong distal pulses no edema Neurologic:  Normal speech and language. No gross focal neurologic deficits are appreciated.  Skin:  Skin is warm, dry and intact. No rash noted. Psychiatric: Mood and affect are Latin depressed. Speech and behavior are normal.  ____________________________________________   LABS (all labs ordered are listed, but only abnormal results are displayed)  Labs Reviewed  COMPREHENSIVE METABOLIC PANEL  ETHANOL  SALICYLATE LEVEL  ACETAMINOPHEN LEVEL  CBC  URINE DRUG SCREEN, QUALITATIVE ([facility] ONLY)  CBG MONITORING, ED  POC URINE PREG, ED    Pertinent labs  results that were available during my care of the patient were reviewed by me and considered in my medical decision making (see chart for details). ____________________________________________  EKG  I personally interpreted any EKGs ordered by me or triage Sinus tachycardia rate 107, normal axis no acute ischemic changes no specific ST changes ____________________________________________  RADIOLOGY  Pertinent labs & imaging results that were available during my care of the patient were reviewed by me and considered in my medical decision making (see chart for details). If possible, patient and/or family made aware of any abnormal findings.  No results found. ____________________________________________    PROCEDURES  Procedure(s) performed: None  Procedures  Critical Care performed: None  ____________________________________________   INITIAL IMPRESSION / ASSESSMENT AND PLAN / ED COURSE  Pertinent labs & imaging results that were available during my care of the patient were reviewed by me and considered in my medical decision making (see chart for details).  Patient here after Tylenol overdose of what she describes as likely 8 g as well as Savella overdose.  We will  check Tylenol level, look for coingestions, discuss w/ poison control, check 4 hour levels as she is unwavering about the time and monitor closely. I will take out ivc paperwork.  ----------------------------------------- 9:25 AM on 05/21/2017 -----------------------------------------  Her rate is trending down to discuss with poison control they agree with our already instituted plan, EKG does not show a long QT or significant QRS prolongation.  They also recommended 4-hour Tylenol level which we are going to do in about 1 hour, as I will make 4 hours from Tylenol ingestion.  As far as Savella ingestion, can cause somnolence and tachycardia, but otherwise at the level she reports taking it  we do not anticipate any significant side effects, and again with 8 g of Tylenol hopefully there will not be a toxic level but we will check to make sure   ____________________________________________   FINAL CLINICAL IMPRESSION(S) / ED DIAGNOSES  Final diagnoses:  None      This chart was dictated using voice recognition software.  Despite best efforts to proofread,  errors can occur which can change meaning.      Jeanmarie Plant, MD 05/21/17 1610    Jeanmarie Plant, MD 05/21/17 9604    Jeanmarie Plant, MD 05/21/17 [phone]

## 2017-05-21 NOTE — BH Assessment (Signed)
Assessment Note  Sierra [name] is an 37 y.o. female who presents to the ED via EMS from home following a suicide attempt wherein she ingested 13 Savella, , 16 Tylenol , 12-16oz of alcohol around 7:30am. She reports feeling "like a rug has been ripped out from under me" after an argument with her boyfriend. She is under the impression that he no longer wants to be in a relationship with her though he has not verbally confirmed it.  She reports that she and her husband are separated and that he is aware of, and is fine with, the affair. She states that she currently lives in the home with her husband, their two children, her husband's oldest son, along with his girlfriend and their two children. She reports that her living situation is very stressful right now considering the situation. She reports that before the "break-up" with her boyfriend she was very happy with her life.   During the assessment, she was calm cooperative and answered each question appropriately. She was very guarded in the beginning but became more comfortable and the assessment went on.  She reports currently receiving outpatient services at [facility] and was hospitalized in the [facility] in 2008 where she was diagnosed with Bipolar Disorder. She reports that she takes three separate medications to help manage her mood swings. Although she reports this as her first suicide attempt she reports that her father died by suicide when she was 89 years old. She reports daily alcohol use, a poor appetite, and broken sleep patterns at night. Pt denies that she is currently suicidal as well as any homicidal ideation. Pt denies A/V H/D.    Diagnosis: Depression  Past Medical History:  Past Medical History:  Diagnosis Date  . Abdominal pain, other specified site   . Anxiety state, unspecified   . Bipolar disorder, unspecified (HCC)   . Depressive disorder, not elsewhere classified   . Edema   . Esophageal reflux   .  Fibromyalgia   . Headache   . History of kidney stones   . Mild or unspecified pre-eclampsia, unspecified as to episode of care   . Morbid obesity (HCC)   . Myalgia and myositis, unspecified   . Other dyspnea and respiratory abnormality   . Preeclampsia    with first pregnancy  . Raynaud's disease   . Urinary tract infection, site not specified     Past Surgical History:  Procedure Laterality Date  . CESAREAN SECTION     X 2  . CHOLECYSTECTOMY  03/2006  . CYSTOSCOPY W/ URETERAL STENT PLACEMENT Left 05/14/2016   Procedure: CYSTOSCOPY WITH STENT REPLACEMENT;  Surgeon: [name], MD;  Location: [facility] ORS;  Service: Urology;  Laterality: Left;  . CYSTOSCOPY WITH STENT PLACEMENT Left 04/25/2016   Procedure: CYSTOSCOPY WITH STENT PLACEMENT;  Surgeon: [name], MD;  Location: [facility] OR[facility];  Service: Urology;  Laterality: Left;  . DILATION AND CURETTAGE OF UTERUS    . IVC FILTER INSERTION  03/26/2016   [facility]  . LAPAROSCOPIC GASTRIC RESTRICTIVE DUODENAL PROCEDURE (DUODENAL SWITCH)  03/2016  . LITHOTRIPSY    . TONSILLECTOMY AND ADENOIDECTOMY    . URETEROSCOPY WITH HOLMIUM LASER LITHOTRIPSY Left 05/14/2016   Procedure: URETEROSCOPY WITH HOLMIUM LASER LITHOTRIPSY;  Surgeon: [name], MD;  Location: [facility] ORS;  Service: Urology;  Laterality: Left;    Family History:  Family History  Problem Relation Age of Onset  . Depression Father   . Alcohol abuse Father   . Depression  Mother   . Hyperlipidemia Mother   . Sleep apnea Mother   . Depression Sister   . Hyperlipidemia Brother   . Depression Brother   . Hyperlipidemia Brother   . Depression Brother   . Alcohol abuse Brother   . Coronary artery disease Maternal Grandmother   . Heart attack Maternal Grandmother   . Diabetes Maternal Grandmother   . Lung cancer Maternal Grandfather   . Emphysema Maternal Grandfather   . Coronary artery disease Maternal Grandfather   . Lupus Unknown        Aunt  . Fibromyalgia Unknown         Aunt    Social History:  reports that she has never smoked. She has never used smokeless tobacco. She reports that she drinks alcohol. She reports that she does not use drugs.  Additional Social History:  Alcohol / Drug Use Pain Medications: See MAR Prescriptions: See MAR Over the Counter: See MAR History of alcohol / drug use?: Yes Substance #1 1 - Amount (size/oz): Alcohol 1 - Frequency: Daily(Pt reports drinking two glasses of wine nightly) 1 - Last Use / Amount: 05/21/2017  CIWA: CIWA-Ar BP: 119/75 Pulse Rate: 96 COWS:    Allergies:  Allergies  Allergen Reactions  . Molds & Smuts   . Pollen Extract   . Uncaria Tomentosa (Cats Claw)   . Milk-Related Compounds     Able to tolerate yogurt; mild intolerance to milk/cheese  . Pineapple   . Soy Allergy   . Strawberry Extract     Break out on tongue noted    Home Medications:  (Not in a hospital admission)  OB/GYN Status:  Patient's last menstrual period was 04/22/2017 (approximate).  General Assessment Data Location of Assessment: [facility] ED TTS Assessment: In system Is this a Tele or Face-to-Face Assessment?: Face-to-Face Is this an Initial Assessment or a Re-assessment for this encounter?: Initial Assessment Marital status: Separated Maiden name: Caryn Section Is patient pregnant?: No Pregnancy Status: No Living Arrangements: Spouse/significant other, Children, Non-relatives/Friends, Other relatives Can pt return to current living arrangement?: Yes Admission Status: Involuntary Is patient capable of signing voluntary admission?: No Referral Source: Self/Family/Friend Insurance type: [employer]  Medical Screening Exam [facility] ONLY) Medical Exam completed: Yes  Crisis Care Plan Living Arrangements: Spouse/significant other, Children, Non-relatives/Friends, Other relatives Legal Guardian: Other:(Self) Name of Psychiatrist: n/a Name of Therapist: n/a  Education Status Is patient currently in school?: No Is  the patient employed, unemployed or receiving disability?: Employed  Risk to self with the past 6 months Suicidal Ideation: No-Not Currently/Within Last 6 Months Has patient been a risk to self within the past 6 months prior to admission? : Yes Suicidal Intent: No-Not Currently/Within Last 6 Months Has patient had any suicidal intent within the past 6 months prior to admission? : Yes Is patient at risk for suicide?: Yes Suicidal Plan?: No-Not Currently/Within Last 6 Months Has patient had any suicidal plan within the past 6 months prior to admission? : Yes Access to Means: Yes Specify Access to Suicidal Means: Overdose on medication What has been your use of drugs/alcohol within the last 12 months?: 2 glasses of wine daily Previous Attempts/Gestures: No How many times?: 0 Other Self Harm Risks: n/a Triggers for Past Attempts: None known Intentional Self Injurious Behavior: None Family Suicide History: Yes Recent stressful life event(s): Conflict (Comment), Divorce, Loss (Comment) Persecutory voices/beliefs?: No Depression: Yes Depression Symptoms: Tearfulness, Feeling worthless/self pity Substance abuse history and/or treatment for substance abuse?: No Suicide prevention information given  to non-admitted patients: Not applicable  Risk to Others within the past 6 months Homicidal Ideation: No Does patient have any lifetime risk of violence toward others beyond the six months prior to admission? : No Thoughts of Harm to Others: No Current Homicidal Intent: No Current Homicidal Plan: No Access to Homicidal Means: No History of harm to others?: No Assessment of Violence: None Noted Violent Behavior Description: n/a Does patient have access to weapons?: Yes (Comment) Criminal Charges Pending?: No Does patient have a court date: No Is patient on probation?: No  Psychosis Hallucinations: None noted Delusions: None noted  Mental Status Report Appearance/Hygiene: Unremarkable, In  scrubs Eye Contact: Good Motor Activity: Freedom of movement Speech: Slow, Soft, Logical/coherent Level of Consciousness: Crying Mood: Depressed, Sad Affect: Appropriate to circumstance Anxiety Level: None Thought Processes: Coherent, Relevant Judgement: Partial Orientation: Person, Place, Situation, Appropriate for developmental age, Time Obsessive Compulsive Thoughts/Behaviors: Minimal  Cognitive Functioning Concentration: Decreased Memory: Recent Intact, Remote Intact Is patient IDD: No Is patient DD?: No Insight: Fair Impulse Control: Poor Appetite: Poor Have you had any weight changes? : Loss Amount of the weight change? (lbs): 5 lbs Sleep: Decreased Total Hours of Sleep: 5 Vegetative Symptoms: None     Prior Inpatient Therapy Prior Inpatient Therapy: Yes Prior Therapy Dates: 2008 Prior Therapy Facilty/Provider(s): [facility] Reason for Treatment: Bipolar  Prior Outpatient Therapy Prior Outpatient Therapy: Yes Prior Therapy Dates: current Prior Therapy Facilty/Provider(s): [facility] Reason for Treatment: Bipolar Does patient have an ACCT team?: No Does patient have Intensive In-House Services?  : No Does patient have [facility] services? : No Does patient have P4CC services?: No  ADL Screening (condition at time of admission) Is the patient deaf or have difficulty hearing?: No Does the patient have difficulty seeing, even when wearing glasses/contacts?: No Does the patient have difficulty concentrating, remembering, or making decisions?: No Does the patient have difficulty dressing or bathing?: No Does the patient have difficulty walking or climbing stairs?: No Weakness of Legs: None Weakness of Arms/Hands: None  Home Assistive Devices/Equipment Home Assistive Devices/Equipment: Eyeglasses  Therapy Consults (therapy consults require a physician order) PT Evaluation Needed: No OT Evalulation Needed: No SLP Evaluation Needed: No             Additional Information 1:1 In Past 12 Months?: No CIRT Risk: No Elopement Risk: No Does patient have medical clearance?: No(waiting from clearance from EDP)  Child/Adolescent Assessment Running Away Risk: (PT IS AN ADULT)  Disposition:  Disposition Initial Assessment Completed for this Encounter: Yes Disposition of Patient: (Pending psych eval) Patient refused recommended treatment: No Mode of transportation if patient is discharged?: Car  On Site Evaluation by:   Reviewed with Physician:    Rahm Minix D Aalijah Lanphere 05/21/2017 12:24 PM

## 2017-05-22 ENCOUNTER — Encounter: Payer: Self-pay | Admitting: Psychiatry

## 2017-05-22 DIAGNOSIS — F339 Major depressive disorder, recurrent, unspecified: Secondary | ICD-10-CM

## 2017-05-22 DIAGNOSIS — F411 Generalized anxiety disorder: Secondary | ICD-10-CM

## 2017-05-22 LAB — HEMOGLOBIN A1C
Hgb A1c MFr Bld: 4.7 % — ABNORMAL LOW (ref 4.8–5.6)
Mean Plasma Glucose: 88.19 mg/dL

## 2017-05-22 LAB — LIPID PANEL
CHOL/HDL RATIO: 2.2 ratio
CHOLESTEROL: 105 mg/dL (ref 0–200)
HDL: 48 mg/dL (ref >40)
LDL Cholesterol: 49 mg/dL (ref 0–99)
TRIGLYCERIDES: 41 mg/dL (ref <150)
VLDL: 8 mg/dL (ref 0–40)

## 2017-05-22 LAB — TSH: TSH: 0.677 u[IU]/mL (ref 0.350–4.500)

## 2017-05-22 MED ORDER — OCUVITE-LUTEIN PO CAPS
1.0000 | ORAL_CAPSULE | Freq: Every day | ORAL | Status: DC
  Administered 2017-05-23 – 2017-05-24 (×2): 1 via ORAL
  Filled 2017-05-22 (×2): qty 1

## 2017-05-22 MED ORDER — IBUPROFEN 600 MG PO TABS
600.0000 mg | ORAL_TABLET | Freq: Four times a day (QID) | ORAL | Status: DC | PRN
  Administered 2017-05-22 – 2017-05-23 (×3): 600 mg via ORAL
  Filled 2017-05-22 (×3): qty 1

## 2017-05-22 MED ORDER — LOPERAMIDE HCL 2 MG PO CAPS: 2.0000 mg | ORAL_CAPSULE | ORAL | Status: DC | PRN

## 2017-05-22 MED ORDER — ESCITALOPRAM OXALATE 10 MG PO TABS
5.0000 mg | ORAL_TABLET | Freq: Once | ORAL | Status: AC
  Administered 2017-05-22: 5 mg via ORAL
  Filled 2017-05-22: qty 1

## 2017-05-22 MED ORDER — ESCITALOPRAM OXALATE 10 MG PO TABS
15.0000 mg | ORAL_TABLET | Freq: Every day | ORAL | Status: DC
  Administered 2017-05-23 – 2017-05-24 (×2): 15 mg via ORAL
  Filled 2017-05-22 (×2): qty 2

## 2017-05-22 NOTE — B[facility] Suicide Risk Assessment (Signed)
[place] INPATIENT:  Family/Significant Other Suicide Prevention Education  Suicide Prevention Education:  Education Completed; Sierra [name], pt's husband, at [phone] has been identified by the patient as the family member/significant other with whom the patient will be residing, and identified as the person(s) who will aid the patient in the event of a mental health crisis (suicidal ideations/suicide attempt).  With written consent from the patient, the family member/significant other has been provided the following suicide prevention education, prior to the and/or following the discharge of the patient.  The suicide prevention education provided includes the following:  Suicide risk factors  Suicide prevention and interventions  National Suicide Hotline telephone number  [facility] assessment telephone number  [facility] Emergency Assistance [place] and/or Residential Mobile Crisis Unit telephone number  Request made of family/significant other to:  Remove weapons (e.g., guns, rifles, knives), all items previously/currently identified as safety concern.    Remove drugs/medications (over-the-counter, prescriptions, illicit drugs), all items previously/currently identified as a safety concern.  The family member/significant other verbalizes understanding of the suicide prevention education information provided.  The family member/significant other agrees to remove the items of safety concern listed above.  Pt's husband stated, "I know that she's in a tough spot in terms of living conditions and what she wants out of life--but there's really nothing we can do. So, she's been making irrational, impulsive decisions frequently. Friday she said that she was miserable where she was at, and she had to get out. So, Saturday she was looking for a job on [place] and came across '[employer] [place] Sunday she went to go meet one of the guys, and by the evening  she was sleeping with at least one of them. She also has a boyfriend in [place], that she's never met, and they've gotten close in the last few months. She had an agreemetn with him that she'd let him know if she slept with anyone else, and she didn't do that--and he was angry and upset about that. She texted something to her, and she didn't respond for several hours and he got pissed about that and stopped responding to her texts. That was about 10 or 11 on Monday morning. She started drinking about 2 in the afternoon on Monday, and sat in the tub from 2-7PM drinking the whole time. And, then she was inconsolable for several hours. I gave her some medicine to help her sleep, but I guess she was too anxious. She kept waking up. Then, she woke me up about 6:40, she asked if 6 Tylenol PM was enough to kill someone. I told her no, and then said she'd already taken it and said she'd need to take more to 'finish the job.' I told her we needed to get her some help. She said that if I called out for help, she'd do something more drastic. I ended up getting on the [employer], I was able to get on there without her hearing me call. They contacted 911."   Pt's husband added, "A couple months ago, out of the blue, she said she was going to move to [place] and leave me and the kids. She said she was going to live in her car until she could afford to find her own place and then she'd have the kids after they finished school. But, she didn't even have a job lined up. She doesn't understand that it's impulsive." Pt's husband confirmed pt is able to return home  and will not have access to weapons or other means of self harm. CSW will continue to coordinate with pt's husband as needed for updates/discharge planning.   Sierra Hipp, LCSW 05/22/2017, 10:09 AM

## 2017-05-22 NOTE — Progress Notes (Signed)
Recreation Therapy Notes  INPATIENT RECREATION THERAPY ASSESSMENT  Patient Details Name: Sierra [name] MRN: [id number] DOB: 30-Jul-1980 Today's Date: 05/22/2017       Information Obtained From: Patient  Able to Participate in Assessment/Interview: Yes  Patient Presentation: Responsive  Reason for Admission (Per Patient): Impulsive Behavior, Suicide Attempt  Patient Stressors: Relationship  Coping Skills:   Write, Exercise, Talk  Leisure Interests (2+):  Art - Draw(Take photos)  Frequency of Recreation/Participation: Monthly  Awareness of Community Resources:  Yes  Community Resources:  Park  Current Use: Yes  If no, Barriers?:    Expressed Interest in [employer] Information:    [place] of Residence:  [place]  Patient Main Form of Transportation: Set designer  Patient Strengths:  Honest  Patient Identified Areas of Improvement:  Setting healthy boundaries  Patient Goal for Hospitalization:  Refresh on coping skillsand get new medication  Current SI (including self-harm):  No  Current HI:  No  Current AVH: No  Staff Intervention Plan: Group Attendance, Collaborate with Interdisciplinary Treatment Team  Consent to Intern Participation: N/A  Gregg Winchell 05/22/2017, 2:47 PM

## 2017-05-22 NOTE — Plan of Care (Signed)
Patient up ad lib with steady gait. A&O x 4. Currently denies SI/HI/AVH. Affect is much brighter today, smiles upon approach. Compliant with medications and meals. Attends group with active peer participation. Milieu remains safe with q 15 minute safety check. Will continue to monitor.
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
## 2017-05-22 NOTE — Progress Notes (Addendum)
Recreation Therapy Notes   Date: 05/22/2017  Time: 9:30 am  Location: Craft Room  Behavioral response: Appropriate   Intervention Topic: Stress  Discussion/Intervention:  Group content on today was focused on stress. The group defined stress and ways to cope with stress. Participants expressed how they know when they are stresses out and certain triggers that stress them out. Individuals described the different ways they have to cope with stress. The group stated reasons why it is important to cope with stress. Patient explained what good stress is and some examples. The group participated in the intervention "Managing Stress". Individuals were able to identify new ways to cope with stress. Clinical Observations/Feedback:  Patient came to group and identified drinking alcohol and isolation as a negative way to deal with stress. She stated others know when she is stressed out because she becomes irritable. Patient was pulled form group by the social work but later returned. Individual participated in the intervention and was social with peers and staff during group.  Meranda Dechaine LRT/CTRS         Willodean Leven 05/22/2017 1:07 PM

## 2017-05-22 NOTE — H&P (Signed)
Psychiatric Admission Assessment Adult  Patient Identification: Sierra [name] MRN:  [id number] Date of Evaluation:  05/22/2017 Chief Complaint:  Suicide attempt Principal Diagnosis: Major depressive disorder, recurrent episode with mixed features ([place]) Diagnosis:   Patient Active Problem List   Diagnosis Date Noted  . Major depressive disorder, recurrent episode with mixed features ([place]) [F33.9] 05/22/2017    Priority: High  . GAD (generalized anxiety disorder) [F41.1] 05/22/2017    Priority: High  . Acetaminophen overdose [T39.1X1A] 05/21/2017  . Left ureteral calculus [N20.1] 04/25/2016  . Tingling in extremities [R20.2] 08/29/2015  . Acute nonintractable headache [R51] 08/29/2015  . Visual disturbance [H53.9] 08/23/2015  . Polydipsia [R63.1] 03/23/2014  . Binge eating disorder [F50.81] 03/23/2014  . Vaginitis and vulvovaginitis [N76.0] 12/31/2013  . Paronychia [IMO0002] 09/21/2013  . Essential hypertension, benign [I10] 06/05/2013  . Bilateral leg pain [M79.604, M79.605] 05/22/2013  . Right carpal tunnel syndrome [G56.01] 09/16/2012  . General counseling and advice on female contraception [Z30.09] 01/22/2012  . Anhydramnios [O41.00X0] 01/02/2012  . Umbilical cord complication [F57.3UK0] 25/42/7062  . High-risk pregnancy [O09.90] 11/09/2011  . H/O cesarean section [Z98.891] 10/12/2011  . Calculus of kidney [N20.0] 10/12/2011  . Obesity affecting pregnancy, antepartum [O99.210] 10/12/2011  . History of cesarean section [Z98.891] 10/12/2011  . SNORING [R06.09, R09.89] 07/23/2007  . EDEMA [R60.9] 06/11/2007  . Morbid obesity ([place]) [E66.01] 12/23/2006  . GERD [K21.9] 12/23/2006  . MILD/UNSPEC PRE-ECLAMPSIA UNSPEC AS EPISODE CARE [IMO0002] 12/23/2006  . Fibromyalgia [M79.7] 12/23/2006   History of Present Illness: 37 yo female admitted due to overdose as a suicide attempt. She overdosed on 13 tabs of Savella and 16 tabs of Tylenol. PT states taht she has been feeling  overwhelmed recently. She and her husband are separated but still living together. She has tried to have a job but due to anxiety has not been able to hold one. She wants to get her own place but does not have finances at this time so this is stressing her out. She also lives with her husbands adult children and their girlfriend. She states that they are messy and "are not good houseguests: which stresses her out. She states that she has been feeling more anxious and worrying about everything. She has been drinking more the past month. She states that she drinks 1-2 glasses of wine a night which is a lot for her. She states that overall her mood has been stable but anxiety has been worse. She denies feeling particularly depressed.  She states, "I wasn't really depressed then and I'm not depressed now." She has been talking to a new guy over text. He "got angry about something and stopped texting me." This is "what threw me over the edge." She states that she was drinking heavily that night. She woke up early and "not feeling good." She drank some of the drinks left over from the night before. She was feeling anxious and decided to take a walk. She states taht "on this walk is when I decided to take the pills."S he came home and took a handful of Tylenol and Ocie [place]field (which she had from an old prescription) and swallowed them. She did not take the whole bottle. She states, " I think I just snapped. This was so impulsive. I just felt stuck." She states that the stress was just building up on her. She states, "I could have used my coping skills but I didn't." She states that the things she could have some were "I really should have just  called my therapist, called my mom and write down my feelings." She admits that she has not been seeing her therapist as often as she should be. Last time was in January and has been neglecting her coping skills. She also acknowledges that she has been drinking more and this is not a  healthy way to cope.  She sees her psychiatrist regularly and feels like she is on a good regimen of medications. She feels they have helped stabilize her mood and overall has ben doing well. She adamantly denies that she was planning to overdose and this is not something that she researched or really thought about. She states that she has a lot of goals and eventually wants to find a job and live on her own. She had bariatric surgery last year and states that she lost 129 lbs. She walks and exercises regularly.   CSW spoke with her husband. He states taht she has been more impulsive. She has been upset about their relationship. She has been having sex with guys she met on [employer]. She also has a boyfriend in [place]. She was drinking a lot on Monday and very upset. The next morning she woke him up telling him that she took overdose of Tylenol. He called with [employer] who called 911. I also spoke with Aaron Edelman. HE states that she asked him a few days ago to hide her sleeping medications which he did. She then asked him to hide all of her medications so he did. He does not feel this attempt was as impulsive as she is possibly saying. He is fine with her coming home when stable.   Associated Signs/Symptoms: Depression Symptoms:  suicidal attempt, anxiety, (Hypo) Manic Symptoms:  Impulsivity, Anxiety Symptoms:  Excessive Worry, Psychotic Symptoms:  Denies PTSD Symptoms: Negative Total Time spent with patient: 1 hour  Past Psychiatric History: Pt reports past diagnosis of bipolar disorder. She sees Dr. Gretel Acre for medication management. She also has a therapist, Dr. Redmond Pulling but has not seen her since January. 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 She denies past suicide attempts. She has been on Savella in the past.   Is the patient at risk to self? Yes.    Has the patient been a risk to self in the past 6 months? Yes.    Has the patient been a risk to self within  the distant past? No.  Is the patient a risk to others? No.  Has the patient been a risk to others in the past 6 months? No.  Has the patient been a risk to others within the distant past? No.   Alcohol Screening: 1. How often do you have a drink containing alcohol?: 2 to 3 times a week 2. How many drinks containing alcohol do you have on a typical day when you are drinking?: 3 or 4 3. How often do you have six or more drinks on one occasion?: Less than monthly AUDIT-C Score: 5 4. How often during the last year have you found that you were not able to stop drinking once you had started?: Never 5. How often during the last year have you failed to do what was normally expected from you becasue of drinking?: Less than monthly 6. How often during the last year have you needed a first drink in the morning to get yourself going after a heavy drinking session?: Never 7. How often during the last year have you had a feeling of guilt  of remorse after drinking?: Never 8. How often during the last year have you been unable to remember what happened the night before because you had been drinking?: Never 9. Have you or someone else been injured as a result of your drinking?: No 10. Has a relative or friend or a doctor or another health worker been concerned about your drinking or suggested you cut down?: No Alcohol Use Disorder Identification Test Final Score (AUDIT): 6 Intervention/Follow-up: AUDIT Score <7 follow-up not indicated Substance Abuse History in the last 12 months:  Yes.  , drinks 1-2 glasses of wine nightly for past month. Was drinking excessively prior to attempt Consequences of Substance Abuse: Medical Consequences:  Overdose Previous Psychotropic Medications: Yes  Psychological Evaluations: Yes  Past Medical History:  Past Medical History:  Diagnosis Date  . Abdominal pain, other specified site   . Anxiety state, unspecified   . Bipolar disorder, unspecified ([place])   . Depressive  disorder, not elsewhere classified   . Edema   . Esophageal reflux   . Fibromyalgia   . Headache   . History of kidney stones   . Mild or unspecified pre-eclampsia, unspecified as to episode of care   . Morbid obesity ([place])   . Myalgia and myositis, unspecified   . Other dyspnea and respiratory abnormality   . Preeclampsia    with first pregnancy  . Raynaud's disease   . Urinary tract infection, site not specified     Past Surgical History:  Procedure Laterality Date  . CESAREAN SECTION     X 2  . CHOLECYSTECTOMY  03/2006  . CYSTOSCOPY W/ URETERAL STENT PLACEMENT Left 05/14/2016   Procedure: CYSTOSCOPY WITH STENT REPLACEMENT;  Surgeon: Hollice Espy, [place];  Location: [facility] ORS;  Service: Urology;  Laterality: Left;  . CYSTOSCOPY WITH STENT PLACEMENT Left 04/25/2016   Procedure: CYSTOSCOPY WITH STENT PLACEMENT;  Surgeon: Hollice Espy, [place];  Location: [facility] OR[facility];  Service: Urology;  Laterality: Left;  . DILATION AND CURETTAGE OF UTERUS    . IVC FILTER INSERTION  03/26/2016   [place] DUODENAL PROCEDURE (DUODENAL SWITCH)  03/2016  . LITHOTRIPSY    . TONSILLECTOMY AND ADENOIDECTOMY    . URETEROSCOPY WITH HOLMIUM LASER LITHOTRIPSY Left 05/14/2016   Procedure: URETEROSCOPY WITH HOLMIUM LASER LITHOTRIPSY;  Surgeon: Hollice Espy, [place];  Location: [facility] ORS;  Service: Urology;  Laterality: Left;   Family History:  Family History  Problem Relation Age of Onset  . Depression Father   . Alcohol abuse Father   . Depression Mother   . Hyperlipidemia Mother   . Sleep apnea Mother   . Depression Sister   . Hyperlipidemia Brother   . Depression Brother   . Hyperlipidemia Brother   . Depression Brother   . Alcohol abuse Brother   . Coronary artery disease Maternal Grandmother   . Heart attack Maternal Grandmother   . Diabetes Maternal Grandmother   . Lung cancer Maternal Grandfather   . Emphysema Maternal Grandfather   . Coronary artery disease  Maternal Grandfather   . Lupus Unknown        Aunt  . Fibromyalgia Unknown        Aunt   Family Psychiatric  History: Mother-depression, father-completed suicide Tobacco Screening: Have you used any form of tobacco in the last 30 days? (Cigarettes, Smokeless Tobacco, Cigars, and/or Pipes): No Social History: She was born in [place]. She went to college in [place] and is currently living in [place]. Living situation is  slightly chaotic. Her and her husband have been married for 15 years. They are currently separated but living together and still int he same bedroom. Her husband has PTSD and has "mood swings." They have 2 kids together ages 35 and 24. Her husband has 2 adult children and his son lives with them with his girlfriend. Pt has been dating another man for a few months. She is not working. 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 She is looking for a job.   Additional Social History: Marital status: Separated Separated, when?: Since January 2019 What types of issues is patient dealing with in the relationship?: Pt is still living with her husband whom she reports is still her best friend and very supportive of her.  Additional relationship information: None reported.  Are you sexually active?: No What is your sexual orientation?: Heterosexual  Has your sexual activity been affected by drugs, alcohol, medication, or emotional stress?: Pt denies.  Does patient have children?: Yes How many children?: 2(Pt reports having two sons: ages 25 and 99.) How is patient's relationship with their children?: "Great."     Pain Medications: See MAR Prescriptions: See MAR Over the Counter: See MAR History of alcohol / drug use?: Yes Longest period of sobriety (when/how long): unknown Negative Consequences of Use: Personal relationships Withdrawal Symptoms: Other  (Comment)(none) Name of Substance 1: Alcohol- Wine/liquor 1 - Amount (size/oz): 1-2 glasses wine a few times a week for one month, 2 shots of liquor 3 nights per week 1 - Frequency: daily 1 - Last Use / Amount: 05/21/17                  Allergies:   Allergies  Allergen Reactions  . Molds & Smuts   . Pollen Extract   . Uncaria Tomentosa ([place])   . Milk-Related Compounds     Able to tolerate yogurt; mild intolerance to milk/cheese  . Pineapple   . Soy Allergy   . Strawberry Extract     Break out on tongue noted   Lab Results:  Results for orders placed or performed during the hospital encounter of 05/21/17 (from the past 48 hour(s))  Basic metabolic panel     Status: Abnormal   Collection Time: 05/21/17  8:23 PM  Result Value Ref Range   Sodium 139 135 - 145 mmol/L   Potassium 3.8 3.5 - 5.1 mmol/L   Chloride 108 101 - 111 mmol/L   CO2 25 22 - 32 mmol/L   Glucose, Bld 113 (H) 65 - 99 mg/dL   BUN 15 6 - 20 mg/dL   Creatinine, Ser 0.84 0.44 - 1.00 mg/dL   Calcium 9.4 8.9 - 10.3 mg/dL   GFR calc non Af Amer >60 >60 mL/min   GFR calc Af Amer >60 >60 mL/min    Comment: (NOTE) The eGFR has been calculated using the CKD EPI equation. This calculation has not been validated in all clinical situations. eGFR's persistently <60 mL/min signify possible Chronic Kidney Disease.    Anion gap 6 5 - 15    Comment: Performed at [facility], [address] [place], [place] [address]  Hemoglobin A1c     Status: Abnormal   Collection Time: 05/22/17  6:43 AM  Result Value Ref Range   Hgb A1c MFr Bld 4.7 (L) 4.8 - 5.6 %  Comment: (NOTE) Pre diabetes:          5.7%-6.4% Diabetes:              >6.4% Glycemic control for   <7.0% adults with diabetes    Mean Plasma Glucose 88.19 mg/dL    Comment: Performed at [facility] [address] [place], [place] [address]  Lipid panel     Status: None   Collection Time: 05/22/17  6:43 AM  Result Value Ref Range    Cholesterol 105 0 - 200 mg/dL   Triglycerides 41 <150 mg/dL   HDL 48 >40 mg/dL   Total CHOL/HDL Ratio 2.2 RATIO   VLDL 8 0 - 40 mg/dL   LDL Cholesterol 49 0 - 99 mg/dL    Comment:        Total Cholesterol/HDL:CHD Risk Coronary Heart Disease Risk Table                     Men   Women  1/2 Average Risk   3.4   3.3  Average Risk       5.0   4.4  2 X Average Risk   9.6   7.1  3 X Average Risk  23.4   11.0        Use the calculated Patient Ratio above and the CHD Risk Table to determine the patient's CHD Risk.        ATP III CLASSIFICATION (LDL):  <100     mg/dL   Optimal  100-129  mg/dL   Near or Above                    Optimal  130-159  mg/dL   Borderline  160-189  mg/dL   High  >190     mg/dL   Very High Performed at [facility], [address] [place], [place] [address]   TSH     Status: None   Collection Time: 05/22/17  6:43 AM  Result Value Ref Range   TSH 0.677 0.350 - 4.500 uIU/mL    Comment: Performed by a 3rd Generation assay with a functional sensitivity of <=0.01 uIU/mL. Performed at [facility], [address] [place], [place] [address]     Blood Alcohol level:  Lab Results  Component Value Date   [facility] <10 32/67/1245    Metabolic Disorder Labs:  Lab Results  Component Value Date   HGBA1C 4.7 (L) 05/22/2017   MPG 88.19 05/22/2017   No results found for: PROLACTIN Lab Results  Component Value Date   CHOL 105 05/22/2017   TRIG 41 05/22/2017   HDL 48 05/22/2017   CHOLHDL 2.2 05/22/2017   VLDL 8 05/22/2017   LDLCALC 49 05/22/2017   LDLCALC 99 09/11/2013    Current Medications: Current Facility-Administered Medications  Medication Dose Route Frequency Provider Last Rate Last Dose  . ARIPiprazole (ABILIFY) tablet 5 mg  5 mg Oral Daily Clapacs, Madie Reno, MD   5 mg at 05/22/17 0817  . [START ON 05/23/2017] escitalopram (LEXAPRO) tablet 15 mg  15 mg Oral Daily Tyris Eliot R, MD      . hydrOXYzine (ATARAX/VISTARIL) tablet 25 mg  25  mg Oral TID PRN Clapacs, John T, MD      . ibuprofen (ADVIL,MOTRIN) tablet 600 mg  600 mg Oral Q6H PRN Obrien Huskins, Tyson Babinski, MD   600 mg at 05/22/17 1114  . lamoTRIgine (LAMICTAL) tablet 100 mg  100 mg Oral Daily  Clapacs, Madie Reno, MD   100 mg at 05/22/17 0817  . loperamide (IMODIUM) capsule 2 mg  2 mg Oral PRN Raymonda Pell, Tyson Babinski, MD      . magnesium hydroxide (MILK OF MAGNESIA) suspension 30 mL  30 mL Oral Daily PRN Clapacs, John T, MD      . montelukast (SINGULAIR) tablet 10 mg  10 mg Oral QHS Clapacs, Madie Reno, MD   10 mg at 05/21/17 2143  . NIFEdipine (PROCARDIA-XL/ADALAT CC) 24 hr tablet 60 mg  60 mg Oral Daily Clapacs, Madie Reno, MD   60 mg at 05/22/17 0817  . pantoprazole (PROTONIX) EC tablet 40 mg  40 mg Oral Daily Clapacs, Madie Reno, MD   40 mg at 05/22/17 0817  . traZODone (DESYREL) tablet 100 mg  100 mg Oral QHS PRN Clapacs, Madie Reno, MD       PTA Medications: Medications Prior to Admission  Medication Sig Dispense Refill Last Dose  . acetaminophen (TYLENOL) 500 MG tablet Take 500 mg by mouth every 6 (six) hours as needed for mild pain.   05/21/2017 at 0730  . ARIPiprazole (ABILIFY) 5 MG tablet Take 1 tablet (5 mg total) by mouth daily. 90 tablet 1 05/20/2017 at Unknown time  . calcium citrate-vitamin D 500-400 MG-UNIT chewable tablet Chew 1 tablet by mouth daily.    05/20/2017 at Unknown time  . cetirizine (ZYRTEC) 10 MG tablet Take 10 mg by mouth daily.    05/20/2017 at Unknown time  . COPPER PO by Intrauterine route.   Not Taking at Unknown time  . escitalopram (LEXAPRO) 10 MG tablet Take 1 tablet (10 mg total) by mouth daily. 90 tablet 1 05/20/2017 at Unknown time  . eszopiclone (LUNESTA) 2 MG TABS tablet Take 1 tablet (2 mg total) by mouth at bedtime as needed for sleep. Take immediately before bedtime 30 tablet 1 PRN at PRN  . lamoTRIgine (LAMICTAL) 100 MG tablet Take 1 tablet (100 mg total) by mouth daily. 90 tablet 1 05/20/2017 at Unknown time  . Milnacipran HCl (SAVELLA) 100 MG TABS tablet Take 100 mg by  mouth 2 (two) times daily.   05/21/2017 at 0730  . montelukast (SINGULAIR) 10 MG tablet Take 10 mg by mouth daily.   05/20/2017 at Unknown time  . Multiple Vitamin (MULTIVITAMIN) tablet Take 1 tablet by mouth daily.   05/20/2017 at Unknown time  . NIFEdipine (PROCARDIA-XL/ADALAT CC) 60 MG 24 hr tablet Take 60 mg by mouth daily.    05/20/2017 at Unknown time  . norethindrone (MICRONOR,CAMILA,ERRIN) 0.35 MG tablet Take 1 tablet (0.35 mg total) by mouth daily. 3 Package 4 05/20/2017 at Unknown time  . omeprazole (PRILOSEC) 20 MG capsule Take 20 mg by mouth daily.   05/20/2017 at Unknown time  . Specialty Vitamins Products (BIOTIN PLUS KERATIN PO) Take 1 tablet by mouth daily.   05/20/2017 at Unknown time    Musculoskeletal: Strength & Muscle Tone: within normal limits Gait & Station: Normal Patient leans: N/A  Psychiatric Specialty Exam: Physical Exam  ROS  Blood pressure (!) 147/82, pulse 65, temperature 99 F (37.2 C), temperature source Oral, resp. rate 20, height '5\' 4"'  (1.626 m), weight 129.3 kg (285 lb), last menstrual period 05/21/2017, SpO2 100 %.Body mass index is 48.92 kg/m.  General Appearance: Casual  Eye Contact:  Good  Speech:  Clear and Coherent  Volume:  Normal  Mood:  Euthymic  Affect:  Congruent  Thought Process:  Coherent and Goal Directed  Orientation:  Full (Time,  Place, and Person)  Thought Content:  Logical  Suicidal Thoughts:  No  Homicidal Thoughts:  No  Memory:  Immediate;   Fair  Judgement:  Fair  Insight:  Fair  Psychomotor Activity:  Normal  Concentration:  Concentration: Fair  Recall:  [employer] of Knowledge:  Fair  Language:  Fair  Akathisia:  No      Assets:  Resilience  ADL's:  Intact  Cognition:  WNL  Sleep:  Number of Hours: 7.15    Treatment Plan Summary: 37 yo female admitted due to suicide attempt. She has many stressors at home which has been causing worsening anxiety and alcohol use leading to an impulsive overdose. Most of the impulsive symptoms  appear to be related to external stressors and not mania/hypomania. She does not appear manic at all currently. She exhibits some traits of borderline personality disorder which may be where the impulsivity and sexual behaviors are stemming from. She has good insight into herself and her coping skills she admits to neglecting. She feels her mood has been overall stable and not depressed. Anxiety is her main symptom currently.   Plan:  Mood disorder/Borderline Traits -Increase Lexapro to 15 mg daily for anxiety -Continue Abilify 5 mg daily -Continue Lamictal 100 mg daily  Raynaud's disease -Procardia 60 mg daily  Dispo -Pt will return home with her husband when stable. He feels safe with this plan. She has appointment with Dr. Gretel Acre on Monday and will need appointment with her therapist.    Observation Level/Precautions:  15 minute checks  Laboratory:  Done in ED  Psychotherapy:    Medications:    Consultations:    Discharge Concerns:    Estimated LOS: 3 days  Other:     Physician Treatment Plan for Primary Diagnosis: Major depressive disorder, recurrent episode with mixed features ([employer]) Long Term Goal(s): Improvement in symptoms so as ready for discharge  Short Term Goals: Ability to disclose and discuss suicidal ideas   I certify that inpatient services furnished can reasonably be expected to improve the patient's condition.    Marylin Crosby, MD 5/8/201912:51 PM

## 2017-05-22 NOTE — [facility] Group Notes (Signed)
LCSW Group Therapy Note  05/22/2017 1:00pm  Type of Therapy/Topic:  Group Therapy:  Emotion Regulation  Participation Level:  Minimal   Description of Group:   The purpose of this group is to assist patients in learning to regulate negative emotions and experience positive emotions. Patients will be guided to discuss ways in which they have been vulnerable to their negative emotions. These vulnerabilities will be juxtaposed with experiences of positive emotions or situations, and patients will be challenged to use positive emotions to combat negative ones. Special emphasis will be placed on coping with negative emotions in conflict situations, and patients will process healthy conflict resolution skills.  Therapeutic Goals: 1. Patient will identify two positive emotions or experiences to reflect on in order to balance out negative emotions 2. Patient will label two or more emotions that they find the most difficult to experience 3. Patient will demonstrate positive conflict resolution skills through discussion and/or role plays  Summary of Patient Progress:  Minimal participation but appropriate and able to meet the above therapeutic goals.     Therapeutic Modalities:   Cognitive Behavioral Therapy Feelings Identification Dialectical Behavioral Therapy   Glennon Mac, LCSW 05/22/2017 2:14 PM

## 2017-05-22 NOTE — Tx Team (Addendum)
Interdisciplinary Treatment and Diagnostic Plan Update  05/22/2017 Time of Session: 1100 Sierra [name] MRN: [id number]  Principal Diagnosis: <principal problem not specified>  Secondary Diagnoses: Active Problems:   Bipolar 1 disorder, depressed (HCC)   Current Medications:  Current Facility-Administered Medications  Medication Dose Route Frequency Provider Last Rate Last Dose  . ARIPiprazole (ABILIFY) tablet 5 mg  5 mg Oral Daily [name], Sierra Denmark, MD   5 mg at 05/22/17 0817  . [START ON 05/23/2017] escitalopram (LEXAPRO) tablet 15 mg  15 mg Oral Daily [name], Sierra R, MD      . hydrOXYzine (ATARAX/VISTARIL) tablet 25 mg  25 mg Oral TID PRN [name], Sierra T, MD      . ibuprofen (ADVIL,MOTRIN) tablet 600 mg  600 mg Oral Q6H PRN [name], Sierra Hutchinson, MD   600 mg at 05/22/17 1114  . lamoTRIgine (LAMICTAL) tablet 100 mg  100 mg Oral Daily [name], Sierra Denmark, MD   100 mg at 05/22/17 0817  . magnesium hydroxide (MILK OF MAGNESIA) suspension 30 mL  30 mL Oral Daily PRN [name], Sierra T, MD      . montelukast (SINGULAIR) tablet 10 mg  10 mg Oral QHS [name], Sierra Denmark, MD   10 mg at 05/21/17 2143  . NIFEdipine (PROCARDIA-XL/ADALAT CC) 24 hr tablet 60 mg  60 mg Oral Daily [name], Sierra Denmark, MD   60 mg at 05/22/17 0817  . pantoprazole (PROTONIX) EC tablet 40 mg  40 mg Oral Daily [name], Sierra Denmark, MD   40 mg at 05/22/17 0817  . traZODone (DESYREL) tablet 100 mg  100 mg Oral QHS PRN [name], Sierra Denmark, MD       PTA Medications: Medications Prior to Admission  Medication Sig Dispense Refill Last Dose  . acetaminophen (TYLENOL) 500 MG tablet Take 500 mg by mouth every 6 (six) hours as needed for mild pain.   05/21/2017 at 0730  . ARIPiprazole (ABILIFY) 5 MG tablet Take 1 tablet (5 mg total) by mouth daily. 90 tablet 1 05/20/2017 at Unknown time  . calcium citrate-vitamin D 500-400 MG-UNIT chewable tablet Chew 1 tablet by mouth daily.    05/20/2017 at Unknown time  . cetirizine (ZYRTEC) 10 MG tablet Take 10 mg by mouth daily.     05/20/2017 at Unknown time  . COPPER PO by Intrauterine route.   Not Taking at Unknown time  . escitalopram (LEXAPRO) 10 MG tablet Take 1 tablet (10 mg total) by mouth daily. 90 tablet 1 05/20/2017 at Unknown time  . eszopiclone (LUNESTA) 2 MG TABS tablet Take 1 tablet (2 mg total) by mouth at bedtime as needed for sleep. Take immediately before bedtime 30 tablet 1 PRN at PRN  . lamoTRIgine (LAMICTAL) 100 MG tablet Take 1 tablet (100 mg total) by mouth daily. 90 tablet 1 05/20/2017 at Unknown time  . Milnacipran HCl (SAVELLA) 100 MG TABS tablet Take 100 mg by mouth 2 (two) times daily.   05/21/2017 at 0730  . montelukast (SINGULAIR) 10 MG tablet Take 10 mg by mouth daily.   05/20/2017 at Unknown time  . Multiple Vitamin (MULTIVITAMIN) tablet Take 1 tablet by mouth daily.   05/20/2017 at Unknown time  . NIFEdipine (PROCARDIA-XL/ADALAT CC) 60 MG 24 hr tablet Take 60 mg by mouth daily.    05/20/2017 at Unknown time  . norethindrone (MICRONOR,CAMILA,ERRIN) 0.35 MG tablet Take 1 tablet (0.35 mg total) by mouth daily. 3 Package 4 05/20/2017 at Unknown time  . omeprazole (PRILOSEC) 20 MG capsule Take 20  mg by mouth daily.   05/20/2017 at Unknown time  . Specialty Vitamins Products (BIOTIN PLUS KERATIN PO) Take 1 tablet by mouth daily.   05/20/2017 at Unknown time    Patient Stressors: Health problems Loss of relationship with boyfriend Marital or family conflict  Patient Strengths: Ability for insight Motivation for treatment/growth  Treatment Modalities: Medication Management, Group therapy, Case management,  1 to 1 session with clinician, Psychoeducation, Recreational therapy.   Physician Treatment Plan for Primary Diagnosis: <principal problem not specified> Long Term Goal(s):     Short Term Goals:    Medication Management: Evaluate patient's response, side effects, and tolerance of medication regimen.  Therapeutic Interventions: 1 to 1 sessions, Unit Group sessions and Medication  administration.  Evaluation of Outcomes: Progressing  Physician Treatment Plan for Secondary Diagnosis: Active Problems:   Bipolar 1 disorder, depressed (HCC)  Long Term Goal(s):     Short Term Goals:       Medication Management: Evaluate patient's response, side effects, and tolerance of medication regimen.  Therapeutic Interventions: 1 to 1 sessions, Unit Group sessions and Medication administration.  Evaluation of Outcomes: Progressing   RN Treatment Plan for Primary Diagnosis: <principal problem not specified> Long Term Goal(s): Knowledge of disease and therapeutic regimen to maintain health will improve  Short Term Goals: Ability to participate in decision making will improve, Ability to identify and develop effective coping behaviors will improve and Compliance with prescribed medications will improve  Medication Management: RN will administer medications as ordered by provider, will assess and evaluate patient's response and provide education to patient for prescribed medication. RN will report any adverse and/or side effects to prescribing provider.  Therapeutic Interventions: 1 on 1 counseling sessions, Psychoeducation, Medication administration, Evaluate responses to treatment, Monitor vital signs and CBGs as ordered, Perform/monitor CIWA, COWS, AIMS and Fall Risk screenings as ordered, Perform wound care treatments as ordered.  Evaluation of Outcomes: Progressing   LCSW Treatment Plan for Primary Diagnosis: <principal problem not specified> Long Term Goal(s): Safe transition to appropriate next level of care at discharge, Engage patient in therapeutic group addressing interpersonal concerns.  Short Term Goals: Engage patient in aftercare planning with referrals and resources, Facilitate acceptance of mental health diagnosis and concerns and Increase skills for wellness and recovery  Therapeutic Interventions: Assess for all discharge needs, 1 to 1 time with Social  worker, Explore available resources and support systems, Assess for adequacy in community support network, Educate family and significant other(s) on suicide prevention, Complete Psychosocial Assessment, Interpersonal group therapy.  Evaluation of Outcomes: Progressing   Progress in Treatment: Attending groups: Yes. Participating in groups: Yes. Taking medication as prescribed: Yes. Toleration medication: Yes. Family/Significant other contact made: Yes, individual(s) contacted:  pt's husband. Patient understands diagnosis: Yes. Discussing patient identified problems/goals with staff: Yes. Medical problems stabilized or resolved: Yes. Denies suicidal/homicidal ideation: Yes. Issues/concerns per patient self-inventory: No. Other: None at this time.   New problem(s) identified: No, Describe:  none at this time.   New Short Term/Long Term Goal(s): Pt reports her goal for treatment is, "to get a refresher on coping skills and increase my medications. I need to get more insight on things."   Discharge Plan or Barriers: Pt will discharge home and will continue [facility] with [facility].   Reason for Continuation of Hospitalization: Depression Medication stabilization  Estimated Length of Stay: 2-3 days   Recreational Therapy: Patient Stressors: Relationship  Patient Goal: Patient will successfully identify 2 ways of making healthy decisions post d/c  within 5 recreation therapy group sessions  Attendees: Patient:  Sierra [name]  05/22/2017 11:15 AM  Physician: Dr. Johnella Moloney, MD  05/22/2017 11:15 AM  Nursing: Hulan Amato, RN 05/22/2017 11:15 AM  RN Care Manager: 05/22/2017 11:15 AM  Social Worker: Heidi Dach, LCSW 05/22/2017 11:15 AM  Recreational Therapist: Garret Reddish, CTRS-LRT 05/22/2017 11:15 AM  Other: Johny Shears, LCSWA 05/22/2017 11:15 AM  Other: Jake Shark ,LCSW 05/22/2017 11:15 AM  Other: 05/22/2017 11:15 AM    Scribe for Treatment Team: Heidi Dach,  LCSW 05/22/2017 11:18 AM

## 2017-05-22 NOTE — [facility] Suicide Risk Assessment (Signed)
[facility] Admission Suicide Risk Assessment   Nursing information obtained from:    Demographic factors:   37 yo, married but separated female Current Mental Status:   recent suicide attempt Loss Factors:   difficulties at home Historical Factors:   impulsivity Risk Reduction Factors:   future oriented, children at home  Total Time spent with patient: 1 hour Principal Problem: Major depressive disorder, recurrent episode with mixed features (HCC) Diagnosis:   Patient Active Problem List   Diagnosis Date Noted  . Major depressive disorder, recurrent episode with mixed features (HCC) [F33.9] 05/22/2017    Priority: High  . GAD (generalized anxiety disorder) [F41.1] 05/22/2017    Priority: High  . Acetaminophen overdose [T39.1X1A] 05/21/2017  . Left ureteral calculus [N20.1] 04/25/2016  . Tingling in extremities [R20.2] 08/29/2015  . Acute nonintractable headache [R51] 08/29/2015  . Visual disturbance [H53.9] 08/23/2015  . Polydipsia [R63.1] 03/23/2014  . Binge eating disorder [F50.81] 03/23/2014  . Vaginitis and vulvovaginitis [N76.0] 12/31/2013  . Paronychia [IMO0002] 09/21/2013  . Essential hypertension, benign [I10] 06/05/2013  . Bilateral leg pain [M79.604, M79.605] 05/22/2013  . Right carpal tunnel syndrome [G56.01] 09/16/2012  . General counseling and advice on female contraception [Z30.09] 01/22/2012  . Anhydramnios [O41.00X0] 01/02/2012  . Umbilical cord complication [O69.9XX0] 12/07/2011  . High-risk pregnancy [O09.90] 11/09/2011  . H/O cesarean section [Z98.891] 10/12/2011  . Calculus of kidney [N20.0] 10/12/2011  . Obesity affecting pregnancy, antepartum [O99.210] 10/12/2011  . History of cesarean section [Z98.891] 10/12/2011  . SNORING [R06.09, R09.89] 07/23/2007  . EDEMA [R60.9] 06/11/2007  . Morbid obesity (HCC) [E66.01] 12/23/2006  . GERD [K21.9] 12/23/2006  . MILD/UNSPEC PRE-ECLAMPSIA UNSPEC AS EPISODE CARE [IMO0002] 12/23/2006  . Fibromyalgia [M79.7] 12/23/2006    Subjective Data: See H&P  Continued Clinical Symptoms:  Alcohol Use Disorder Identification Test Final Score (AUDIT): 6 The "Alcohol Use Disorders Identification Test", Guidelines for Use in Primary Care, Second Edition.  World Science writer [facility]). Score between 0-7:  no or low risk or alcohol related problems. Score between 8-15:  moderate risk of alcohol related problems. Score between 16-19:  high risk of alcohol related problems. Score 20 or above:  warrants further diagnostic evaluation for alcohol dependence and treatment.   CLINICAL FACTORS:   Personality Disorders:   Cluster B     COGNITIVE FEATURES THAT CONTRIBUTE TO RISK:  None    SUICIDE RISK:   Mild:  Suicidal ideation of limited frequency, intensity, duration, and specificity.  There are no identifiable plans, no associated intent, mild dysphoria and related symptoms, good self-control (both objective and subjective assessment), few other risk factors, and identifiable protective factors, including available and accessible social support.  PLAN OF CARE: See H&P  I certify that inpatient services furnished can reasonably be expected to improve the patient's condition.   Haskell Riling, MD 05/22/2017, 3:35 PM

## 2017-05-23 NOTE — Plan of Care (Signed)
Patient up ad lib with a steady gait. Alert and oriented x 4. Reports that she slept good last night without the use of sleep aids. Describes her energy level as normal and her concentration as good. Denies any depression and hopelessness and rates her anxiety at a 2. Denies SI/HI/AVH at this time. States that she plans to work on her coping skills and think of ideas of how to deal with stressors in her life. She also states that she will attend group today and take time to write out her concerns to discuss with counselor.

## 2017-05-23 NOTE — [facility] Group Notes (Signed)
  05/23/2017  Time: 1PM  Type of Therapy/Topic:  Group Therapy:  Balance in Life  Participation Level:  Did Not Attend  Description of Group:   This group will address the concept of balance and how it feels and looks when one is unbalanced. Patients will be encouraged to process areas in their lives that are out of balance and identify reasons for remaining unbalanced. Facilitators will guide patients in utilizing problem-solving interventions to address and correct the stressor making their life unbalanced. Understanding and applying boundaries will be explored and addressed for obtaining and maintaining a balanced life. Patients will be encouraged to explore ways to assertively make their unbalanced needs known to significant others in their lives, using other group members and facilitator for support and feedback.  Therapeutic Goals: 1. Patient will identify two or more emotions or situations they have that consume much of in their lives. 2. Patient will identify signs/triggers that life has become out of balance:  3. Patient will identify two ways to set boundaries in order to achieve balance in their lives:  4. Patient will demonstrate ability to communicate their needs through discussion and/or role plays  Summary of Patient Progress: Pt was invited to attend group but chose not to attend. CSW will continue to encourage pt to attend group throughout their admission.     Therapeutic Modalities:   Cognitive Behavioral Therapy Solution-Focused Therapy Assertiveness Training  Heidi Dach, MSW, LCSW Clinical Social Worker 05/23/2017 2:03 PM

## 2017-05-23 NOTE — [facility] Group Notes (Signed)
[facility] Group Notes:  (Nursing/MHT/Case Management/Adjunct)  Date:  05/23/2017  Time:  12:29 AM  Type of Therapy:  Group Therapy  Participation Level:  Active  Participation Quality:  Appropriate  Affect:  Appropriate  Cognitive:  Alert  Insight:  Good  Engagement in Group:  Engaged  Modes of Intervention:  Support  Summary of Progress/Problems:  Sierra [name] 05/23/2017, 12:29 AM

## 2017-05-23 NOTE — Plan of Care (Signed)
  Problem: Education: Goal: Utilization of techniques to improve thought processes will improve Outcome: Progressing Goal: Knowledge of the prescribed therapeutic regimen will improve Outcome: Progressing   Problem: Coping: Goal: Coping ability will improve Outcome: Progressing Goal: Will verbalize feelings Outcome: Progressing   

## 2017-05-23 NOTE — Progress Notes (Signed)
Recreation Therapy Notes   Date: 05/23/2017  Time: 9:30 am  Location: Craft Room  Behavioral response: Appropriate   Intervention Topic: Coping Skills  Discussion/Intervention:  Group content on today was focused on coping skills. The group defined what coping skills are and when they can be used. Individuals described how they normally cope with things and the coping skills they normally use. Patients expressed why it is important to cope with things and how not coping with things can affect you. The group participated in the intervention "Exploring coping skills" where they had a chance to test new coping skills they could use in the future.  Clinical Observations/Feedback:  Patient came to group and identified reading as a coping skill she uses often.Individual participated in the intervention and was social with peers and staff during group.   Dalen Hennessee LRT/CTRS          Orson Rho 05/23/2017 11:54 AM

## 2017-05-23 NOTE — [facility] Group Notes (Signed)
LCSW Group Therapy Note 05/23/2017 9:00 AM  Type of Therapy and Topic:  Group Therapy:  Setting Goals  Participation Level:  Active  Description of Group: In this process group, patients discussed using strengths to work toward goals and address challenges.  Patients identified two positive things about themselves and one goal they were working on.  Patients were given the opportunity to share openly and support each other's plan for self-empowerment.  The group discussed the value of gratitude and were encouraged to have a daily reflection of positive characteristics or circumstances.  Patients were encouraged to identify a plan to utilize their strengths to work on current challenges and goals.  Therapeutic Goals 1. Patient will verbalize personal strengths/positive qualities and relate how these can assist with achieving desired personal goals 2. Patients will verbalize affirmation of peers plans for personal change and goal setting 3. Patients will explore the value of gratitude and positive focus as related to successful achievement of goals 4. Patients will verbalize a plan for regular reinforcement of personal positive qualities and circumstances.  Summary of Patient Progress:  Sierra [name] was able to actively participate in today's group discussion on setting goals using the SMART Model.  Sierra [name] appears to have a good understanding of how she can use the SMART Model to develop her own goals both while in the hospital and when she is discharged.  Sierra [name] shared that her initial goal that she would like to start while in the hospital is "start thinking about steps I can take to better deal with stress.  I currently use journaling which has been helpful".     Therapeutic Modalities Cognitive Behavioral Therapy Motivational Interviewing    Alease Frame, [place] 05/23/2017 10:37 AM

## 2017-05-23 NOTE — Progress Notes (Signed)
[facility] MD Progress Note  05/23/2017 1:44 PM Sierra [name]  MRN:  [id number]   Subjective:  Pt states that she is doing well today. She has been attending groups and refreshing her coping skills. She has started writing again which is helpful for her. She made a few lists of things she needs to do and accomplish. We went over a list of some of coping skills and goals. She states that one main one is to work on her relationship with her children and her husband. She also wants to get into her therapist more often and is very adamant about this. She is upset with her self that she has been neglecting this. She is very future oriented and is going to keep looking for jobs so she can eventually live on her own. She wants to have a family meeting with her step son and his girlfriend about things that have upset her. She has consisently been denying SI or thoughts of self harm. Discussed some of the things her husband was concerned about including impulsivity. She states taht she has bene doing some of these things because she has been feeling very stressed and needing to get away from the situation. She denies any symptoms of mania during these times. She also admits that she did ask her husband to hide her medications a few days prior because "I had a feeling it was going to get to that point and I didn't want to do it." Pt is very forthcoming with informational and has good insight into her self. She also wants to work on her drinking.   Principal Problem: Major depressive disorder, recurrent episode with mixed features ([place]) Diagnosis:   Patient Active Problem List   Diagnosis Date Noted  . Major depressive disorder, recurrent episode with mixed features ([place]) [F33.9] 05/22/2017    Priority: High  . GAD (generalized anxiety disorder) [F41.1] 05/22/2017    Priority: High  . Acetaminophen overdose [T39.1X1A] 05/21/2017  . Left ureteral calculus [N20.1] 04/25/2016  . Tingling in extremities [R20.2]  08/29/2015  . Acute nonintractable headache [R51] 08/29/2015  . Visual disturbance [H53.9] 08/23/2015  . Polydipsia [R63.1] 03/23/2014  . Binge eating disorder [F50.81] 03/23/2014  . Vaginitis and vulvovaginitis [N76.0] 12/31/2013  . Paronychia [IMO0002] 09/21/2013  . Essential hypertension, benign [I10] 06/05/2013  . Bilateral leg pain [M79.604, M79.605] 05/22/2013  . Right carpal tunnel syndrome [G56.01] 09/16/2012  . General counseling and advice on female contraception [Z30.09] 01/22/2012  . Anhydramnios [O41.00X0] 01/02/2012  . Umbilical cord complication [H88.5OY7] 74/12/8784  . High-risk pregnancy [O09.90] 11/09/2011  . H/O cesarean section [Z98.891] 10/12/2011  . Calculus of kidney [N20.0] 10/12/2011  . Obesity affecting pregnancy, antepartum [O99.210] 10/12/2011  . History of cesarean section [Z98.891] 10/12/2011  . SNORING [R06.09, R09.89] 07/23/2007  . EDEMA [R60.9] 06/11/2007  . Morbid obesity ([place]) [E66.01] 12/23/2006  . GERD [K21.9] 12/23/2006  . MILD/UNSPEC PRE-ECLAMPSIA UNSPEC AS EPISODE CARE [IMO0002] 12/23/2006  . Fibromyalgia [M79.7] 12/23/2006   Total Time spent with patient: 20 minutes  Past Psychiatric History: See H&P  Past Medical History:  Past Medical History:  Diagnosis Date  . Abdominal pain, other specified site   . Affective bipolar disorder ([place]) 10/12/2011  . Anxiety state, unspecified   . Bipolar 1 disorder, depressed ([place]) 05/21/2017  . BIPOLAR AFFECTIVE DISORDER 12/23/2006   Qualifier: Diagnosis of  By: Diona Browner MD, Amy    . Bipolar affective disorder ([place]) 10/12/2011  . Bipolar disorder, unspecified ([place])   . Depressive disorder,  not elsewhere classified   . Edema   . Esophageal reflux   . Fibromyalgia   . Headache   . History of kidney stones   . Mild or unspecified pre-eclampsia, unspecified as to episode of care   . Morbid obesity ([place])   . Myalgia and myositis, unspecified   . Other dyspnea and respiratory abnormality   .  Preeclampsia    with first pregnancy  . Raynaud's disease   . Urinary tract infection, site not specified     Past Surgical History:  Procedure Laterality Date  . CESAREAN SECTION     X 2  . CHOLECYSTECTOMY  03/2006  . CYSTOSCOPY W/ URETERAL STENT PLACEMENT Left 05/14/2016   Procedure: CYSTOSCOPY WITH STENT REPLACEMENT;  Surgeon: Hollice Espy, MD;  Location: [facility] ORS;  Service: Urology;  Laterality: Left;  . CYSTOSCOPY WITH STENT PLACEMENT Left 04/25/2016   Procedure: CYSTOSCOPY WITH STENT PLACEMENT;  Surgeon: Hollice Espy, MD;  Location: [facility] ORS;  Service: Urology;  Laterality: Left;  . DILATION AND CURETTAGE OF UTERUS    . IVC FILTER INSERTION  03/26/2016   [place] DUODENAL PROCEDURE (DUODENAL SWITCH)  03/2016  . LITHOTRIPSY    . TONSILLECTOMY AND ADENOIDECTOMY    . URETEROSCOPY WITH HOLMIUM LASER LITHOTRIPSY Left 05/14/2016   Procedure: URETEROSCOPY WITH HOLMIUM LASER LITHOTRIPSY;  Surgeon: Hollice Espy, MD;  Location: [facility] ORS;  Service: Urology;  Laterality: Left;   Family History:  Family History  Problem Relation Age of Onset  . Depression Father   . Alcohol abuse Father   . Depression Mother   . Hyperlipidemia Mother   . Sleep apnea Mother   . Depression Sister   . Hyperlipidemia Brother   . Depression Brother   . Hyperlipidemia Brother   . Depression Brother   . Alcohol abuse Brother   . Coronary artery disease Maternal Grandmother   . Heart attack Maternal Grandmother   . Diabetes Maternal Grandmother   . Lung cancer Maternal Grandfather   . Emphysema Maternal Grandfather   . Coronary artery disease Maternal Grandfather   . Lupus Unknown        Aunt  . Fibromyalgia Unknown        Aunt   Family Psychiatric  History: See H&P Social History:  Social History   Substance and Sexual Activity  Alcohol Use Yes  . Alcohol/week: 1.2 - 1.8 oz  . Types: 2 Glasses of wine per week   Comment: occasional < 1 month      Social History   Substance and Sexual Activity  Drug Use No    Social History   Socioeconomic History  . Marital status: Married    Spouse name: brian  . Number of children: 2  . Years of education: Not on file  . Highest education level: Some college, no degree  Occupational History  . Occupation: Materials engineer  Social Needs  . Financial resource strain: Not hard at all  . Food insecurity:    Worry: Never true    Inability: Never true  . Transportation needs:    Medical: No    Non-medical: No  Tobacco Use  . Smoking status: Never Smoker  . Smokeless tobacco: Never Used  Substance and Sexual Activity  . Alcohol use: Yes    Alcohol/week: 1.2 - 1.8 oz    Types: 2 Glasses of wine per week    Comment: occasional < 1 month  . Drug use: No  . Sexual activity:  Yes    Partners: Male    Birth control/protection: None, IUD  Lifestyle  . Physical activity:    Days per week: 0 days    Minutes per session: 0 min  . Stress: To some extent  Relationships  . Social connections:    Talks on phone: More than three times a week    Gets together: Once a week    Attends religious service: Never    Active member of club or organization: No    Attends meetings of clubs or organizations: Never    Relationship status: Married  Other Topics Concern  . Not on file  Social History Narrative   1 child, 2 step-sons      Regular exercise-no   Diet: no fast food, likes sweets   Additional Social History:    Pain Medications: See MAR Prescriptions: See MAR Over the Counter: See MAR History of alcohol / drug use?: Yes Longest period of sobriety (when/how long): unknown Negative Consequences of Use: Personal relationships Withdrawal Symptoms: Other (Comment)(none) Name of Substance 1: Alcohol- Wine/liquor 1 - Amount (size/oz): 1-2 glasses wine a few times a week for one month, 2 shots of liquor 3 nights per week 1 - Frequency: daily 1 - Last Use / Amount: 05/21/17                   Sleep: Good  Appetite:  Good  Current Medications: Current Facility-Administered Medications  Medication Dose Route Frequency Provider Last Rate Last Dose  . ARIPiprazole (ABILIFY) tablet 5 mg  5 mg Oral Daily Clapacs, Madie Reno, MD   5 mg at 05/23/17 0807  . escitalopram (LEXAPRO) tablet 15 mg  15 mg Oral Daily Brenley Priore, Tyson Babinski, MD   15 mg at 05/23/17 1275  . hydrOXYzine (ATARAX/VISTARIL) tablet 25 mg  25 mg Oral TID PRN Clapacs, John T, MD      . ibuprofen (ADVIL,MOTRIN) tablet 600 mg  600 mg Oral Q6H PRN Marylin Crosby, MD   600 mg at 05/22/17 2145  . lamoTRIgine (LAMICTAL) tablet 100 mg  100 mg Oral Daily Clapacs, Madie Reno, MD   100 mg at 05/23/17 0806  . loperamide (IMODIUM) capsule 2 mg  2 mg Oral PRN Sabriya Yono, Tyson Babinski, MD      . magnesium hydroxide (MILK OF MAGNESIA) suspension 30 mL  30 mL Oral Daily PRN Clapacs, John T, MD      . montelukast (SINGULAIR) tablet 10 mg  10 mg Oral QHS Clapacs, Madie Reno, MD   10 mg at 05/22/17 2145  . multivitamin-lutein (OCUVITE-LUTEIN) capsule 1 capsule  1 capsule Oral Daily Huntley Knoop, Tyson Babinski, MD   1 capsule at 05/23/17 0823  . NIFEdipine (PROCARDIA-XL/ADALAT CC) 24 hr tablet 60 mg  60 mg Oral Daily Clapacs, Madie Reno, MD   60 mg at 05/23/17 0823  . pantoprazole (PROTONIX) EC tablet 40 mg  40 mg Oral Daily Clapacs, Madie Reno, MD   40 mg at 05/23/17 0806  . traZODone (DESYREL) tablet 100 mg  100 mg Oral QHS PRN Clapacs, Madie Reno, MD        Lab Results:  Results for orders placed or performed during the hospital encounter of 05/21/17 (from the past 48 hour(s))  Basic metabolic panel     Status: Abnormal   Collection Time: 05/21/17  8:23 PM  Result Value Ref Range   Sodium 139 135 - 145 mmol/L   Potassium 3.8 3.5 - 5.1 mmol/L   Chloride 108 101 -  111 mmol/L   CO2 25 22 - 32 mmol/L   Glucose, Bld 113 (H) 65 - 99 mg/dL   BUN 15 6 - 20 mg/dL   Creatinine, Ser 0.84 0.44 - 1.00 mg/dL   Calcium 9.4 8.9 - 10.3 mg/dL   GFR calc non Af Amer >60 >60 mL/min   GFR calc Af  Amer >60 >60 mL/min    Comment: (NOTE) The eGFR has been calculated using the CKD EPI equation. This calculation has not been validated in all clinical situations. eGFR's persistently <60 mL/min signify possible Chronic Kidney Disease.    Anion gap 6 5 - 15    Comment: Performed at [facility], [address] [place], [place] [address]  Hemoglobin A1c     Status: Abnormal   Collection Time: 05/22/17  6:43 AM  Result Value Ref Range   Hgb A1c MFr Bld 4.7 (L) 4.8 - 5.6 %    Comment: (NOTE) Pre diabetes:          5.7%-6.4% Diabetes:              >6.4% Glycemic control for   <7.0% adults with diabetes    Mean Plasma Glucose 88.19 mg/dL    Comment: Performed at [facility] [address], [place], [place] [address]  Lipid panel     Status: None   Collection Time: 05/22/17  6:43 AM  Result Value Ref Range   Cholesterol 105 0 - 200 mg/dL   Triglycerides 41 <150 mg/dL   HDL 48 >40 mg/dL   Total CHOL/HDL Ratio 2.2 RATIO   VLDL 8 0 - 40 mg/dL   LDL Cholesterol 49 0 - 99 mg/dL    Comment:        Total Cholesterol/HDL:CHD Risk Coronary Heart Disease Risk Table                     Men   Women  1/2 Average Risk   3.4   3.3  Average Risk       5.0   4.4  2 X Average Risk   9.6   7.1  3 X Average Risk  23.4   11.0        Use the calculated Patient Ratio above and the CHD Risk Table to determine the patient's CHD Risk.        ATP III CLASSIFICATION (LDL):  <100     mg/dL   Optimal  100-129  mg/dL   Near or Above                    Optimal  130-159  mg/dL   Borderline  160-189  mg/dL   High  >190     mg/dL   Very High Performed at [facility], [address] [place], [place] [address]   TSH     Status: None   Collection Time: 05/22/17  6:43 AM  Result Value Ref Range   TSH 0.677 0.350 - 4.500 uIU/mL    Comment: Performed by a 3rd Generation assay with a functional sensitivity of <=0.01 uIU/mL. Performed at [facility], [address] [place], [place] [address]     Blood Alcohol level:  Lab Results  Component Value Date   [facility] <10 88/91/6945    Metabolic Disorder Labs: Lab Results  Component Value Date   HGBA1C 4.7 (L) 05/22/2017   MPG 88.19 05/22/2017   No results found for: PROLACTIN Lab Results  Component Value Date   CHOL 105 05/22/2017   TRIG 41 05/22/2017   HDL 48 05/22/2017   CHOLHDL 2.2 05/22/2017   VLDL 8 05/22/2017   LDLCALC 49 05/22/2017   LDLCALC 99 09/11/2013    Physical Findings: AIMS: Facial and Oral Movements Muscles of Facial Expression: None, normal Lips and Perioral Area: None, normal Jaw: None, normal Tongue: None, normal,Extremity Movements Upper (arms, wrists, hands, fingers): None, normal Lower (legs, knees, ankles, toes): None, normal, Trunk Movements Neck, shoulders, hips: None, normal, Overall Severity Severity of abnormal movements (highest score from questions above): None, normal Incapacitation due to abnormal movements: None, normal Patient's awareness of abnormal movements (rate only patient's report): No Awareness, Dental Status Current problems with teeth and/or dentures?: No Does patient usually wear dentures?: No  CIWA:  CIWA-Ar Total: 2 COWS:  COWS Total Score: 1  Musculoskeletal: Strength & Muscle Tone: within normal limits Gait & Station: normal Patient leans: N/A  Psychiatric Specialty Exam: Physical Exam  ROS  Blood pressure 116/64, pulse 92, temperature 98.7 F (37.1 C), temperature source Oral, resp. rate 18, height '5\' 4"'  (1.626 m), weight 129.3 kg (285 lb), last menstrual period 05/21/2017, SpO2 100 %.Body mass index is 48.92 kg/m.  General Appearance: Casual  Eye Contact:  Good  Speech:  Clear and Coherent  Volume:  Normal  Mood:  Euthymic  Affect:  Appropriate and Congruent  Thought Process:  Coherent and Goal Directed  Orientation:  Full (Time, Place, and Person)  Thought Content:  Logical  Suicidal Thoughts:  No  Homicidal Thoughts:  No   Memory:  Immediate;   Fair  Judgement:  Fair  Insight:  Fair  Psychomotor Activity:  Normal  Concentration:  Attention Span: Fair  Recall:  [employer] of Knowledge:  Fair  Language:  Fair  Akathisia:  No      Assets:  Resilience  ADL's:  Intact  Cognition:  WNL  Sleep:  Number of Hours: 7.75     Treatment Plan Summary: 37 yo female admitted due to impulsive overdose. She has been attending all groups and has pleasant affect. She exhibits no symptoms of mania or hypomania and has been sleeping well. WE discussed together about a possible diagnosis of borderline personality disorder given the nature of mood swings and impulsivity. She was given some print out on it and stated that the symptoms sounded very similar to what she goes through.   Plan:  Mood/anxiety, borderline traits -Continue Lexapro 15 mg daily -Continue Lamictal 100 mg daily -Continue Abilify 5 mg daily  Raynaud's -Procardia 60 mg daily  Dispo -She will return home when stable. She will see Dr. Gretel Acre on Monday  Greater than 50% of face to face time with patient was spent on counseling and coordination of care. We discussed possible diagnosis of borderline personality disorder, educated on symptoms of this and treatment for it including therapy.   Marylin Crosby, MD 05/23/2017, 1:44 PM

## 2017-05-23 NOTE — Progress Notes (Signed)
Patient denies SI/HI/AVH alert and oriented x 4, interacting appropriately no distress noted .  Patient is complaint with medication 15 minutes safety rounds maintained, will continue monitor.

## 2017-05-24 MED ORDER — HYDROXYZINE HCL 25 MG PO TABS: 25.0000 mg | ORAL_TABLET | Freq: Three times a day (TID) | ORAL | 0 refills | Status: DC | PRN

## 2017-05-24 MED ORDER — ESCITALOPRAM OXALATE 10 MG PO TABS: 15.0000 mg | ORAL_TABLET | Freq: Every day | ORAL | Status: DC

## 2017-05-24 NOTE — Progress Notes (Signed)
Recreation Therapy Notes  INPATIENT RECREATION TR PLAN  Patient Details Name: Sierra [name] MRN: [id number] DOB: 1980/06/24 Today's Date: 05/24/2017  Rec Therapy Plan Is patient appropriate for Therapeutic Recreation?: Yes Treatment times per week: at least 3 Estimated Length of Stay: 5-7 days TR Treatment/Interventions: Group participation (Comment)  Discharge Criteria Pt will be discharged from therapy if:: Discharged Treatment plan/goals/alternatives discussed and agreed upon by:: Patient/family  Discharge Summary Short term goals set: Patient will successfully identify 2 ways of making healthy decisions post d/c within 5 recreation therapy group sessions Short term goals met: Complete Progress toward goals comments: Groups attended Which groups?: Stress management, Leisure education, Coping skills Reason goals not met: N/A Therapeutic equipment acquired: N/A Reason patient discharged from therapy: Discharge from hospital Pt/family agrees with progress & goals achieved: Yes Date patient discharged from therapy: 05/24/17   Sachin Ferencz 05/24/2017, 12:22 PM

## 2017-05-24 NOTE — Progress Notes (Signed)
Patient denies SI/HI/AVH, affect is flat, but she brightens upon approach, alert and oriented x 4, interacting appropriately no distress noted, thoughts are organized and logical, interactijng with peers and staff appropriately.Patient was given support and encouragement.    Patient is complaint with medication 15 minutes safety rounds maintained, will continue monitor

## 2017-05-24 NOTE — Progress Notes (Signed)
Recreation Therapy Notes  Date: 05/24/2017  Time: 9:30 am  Location: Craft Room  Behavioral response: Appropriate   Intervention Topic: Leisure  Discussion/Intervention:  Group content today was focused on leisure. The group defined what leisure is and some positive leisure activities they participate in. Individuals identified the difference between good and bad leisure. Participants expressed how they feel after participating in the leisure of their choice. The group discussed how they go about picking a leisure activity and if others are involved in their leisure activities. The patient stated how many leisure activities they too choose from and reasons why it is important to have leisure time. Individuals participated in the intervention "Leisure Jeopardy" where they had a chance to identify new leisure activities as well as benefits of leisure. Clinical Observations/Feedback:  Patient came to group and defined leisure as unstructured fun. She identified watching television as a leisure she enjoys. Individual expressed that participating in leisure changes her focus. Patient participated in the intervention and was social with peers and staff during group.  Gunner Iodice LRT/CTRS         Yehudit Fulginiti 05/24/2017 12:21 PM

## 2017-05-24 NOTE — Discharge Summary (Signed)
Physician Discharge Summary Note  Patient:  Sierra [name] is an 37 y.o., female MRN:  [id number] DOB:  January 03, 1981 Patient phone:  [phone] (home)  Patient address:   [address] [place] [address],  Total Time spent with patient: 20 minutes  Plus 20 minutes of medication reconciliation, discharge planning, and discharge documentation   Date of Admission:  05/21/2017 Date of Discharge: 05/24/17  Reason for Admission:  Suicide attempt  Principal Problem: Major depressive disorder, recurrent episode with mixed features [facility]) Discharge Diagnoses: Patient Active Problem List   Diagnosis Date Noted  . Major depressive disorder, recurrent episode with mixed features (HCC) [F33.9] 05/22/2017    Priority: High  . GAD (generalized anxiety disorder) [F41.1] 05/22/2017    Priority: High  . Acetaminophen overdose [T39.1X1A] 05/21/2017  . Left ureteral calculus [N20.1] 04/25/2016  . Tingling in extremities [R20.2] 08/29/2015  . Acute nonintractable headache [R51] 08/29/2015  . Visual disturbance [H53.9] 08/23/2015  . Polydipsia [R63.1] 03/23/2014  . Binge eating disorder [F50.81] 03/23/2014  . Vaginitis and vulvovaginitis [N76.0] 12/31/2013  . Paronychia [IMO0002] 09/21/2013  . Essential hypertension, benign [I10] 06/05/2013  . Bilateral leg pain [M79.604, M79.605] 05/22/2013  . Right carpal tunnel syndrome [G56.01] 09/16/2012  . General counseling and advice on female contraception [Z30.09] 01/22/2012  . Anhydramnios [O41.00X0] 01/02/2012  . Umbilical cord complication [O69.9XX0] 12/07/2011  . High-risk pregnancy [O09.90] 11/09/2011  . H/O cesarean section [Z98.891] 10/12/2011  . Calculus of kidney [N20.0] 10/12/2011  . Obesity affecting pregnancy, antepartum [O99.210] 10/12/2011  . History of cesarean section [Z98.891] 10/12/2011  . SNORING [R06.09, R09.89] 07/23/2007  . EDEMA [R60.9] 06/11/2007  . Morbid obesity (HCC) [E66.01] 12/23/2006  . GERD [K21.9] 12/23/2006  .  MILD/UNSPEC PRE-ECLAMPSIA UNSPEC AS EPISODE CARE [IMO0002] 12/23/2006  . Fibromyalgia [M79.7] 12/23/2006    Past Psychiatric History: See H&P  Past Medical History:  Past Medical History:  Diagnosis Date  . Abdominal pain, other specified site   . 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 Anxiety state, unspecified   . Bipolar 1 disorder, depressed (HCC) 05/21/2017  . BIPOLAR AFFECTIVE DISORDER 12/23/2006   Qualifier: Diagnosis of  By: Ermalene Searing MD, Amy    . Bipolar affective disorder (HCC) 10/12/2011  . Bipolar disorder, unspecified (HCC)   . Depressive disorder, not elsewhere classified   . Edema   . Esophageal reflux   . Fibromyalgia   . Headache   . History of kidney stones   . Mild or unspecified pre-eclampsia, unspecified as to episode of care   . Morbid obesity (HCC)   . Myalgia and myositis, unspecified   . Other dyspnea and respiratory abnormality   . Preeclampsia    with first pregnancy  . Raynaud's disease   . Urinary tract infection, site not specified     Past Surgical History:  Procedure Laterality Date  . CESAREAN SECTION     X 2  . CHOLECYSTECTOMY  03/2006  . CYSTOSCOPY W/ URETERAL STENT PLACEMENT Left 05/14/2016   Procedure: CYSTOSCOPY WITH STENT REPLACEMENT;  Surgeon: [name], MD;  Location: [facility] ORS;  Service: Urology;  Laterality: Left;  . CYSTOSCOPY WITH STENT PLACEMENT Left 04/25/2016   Procedure: CYSTOSCOPY WITH STENT PLACEMENT;  Surgeon: [name], MD;  Location: [facility] OR[facility];  Service: Urology;  Laterality: Left;  . DILATION AND CURETTAGE OF UTERUS    . IVC FILTER INSERTION  03/26/2016   [facility]  . LAPAROSCOPIC GASTRIC RESTRICTIVE DUODENAL PROCEDURE (DUODENAL SWITCH)  03/2016  . LITHOTRIPSY    .  TONSILLECTOMY AND ADENOIDECTOMY    . URETEROSCOPY WITH HOLMIUM LASER LITHOTRIPSY Left 05/14/2016   Procedure: URETEROSCOPY WITH HOLMIUM LASER LITHOTRIPSY;  Surgeon: [name], MD;  Location: [facility] OR[facility];  Service: Urology;  Laterality: Left;    Family History:  Family History  Problem Relation Age of Onset  . Depression Father   . Alcohol abuse Father   . Depression Mother   . Hyperlipidemia Mother   . Sleep apnea Mother   . Depression Sister   . Hyperlipidemia Brother   . Depression Brother   . Hyperlipidemia Brother   . Depression Brother   . Alcohol abuse Brother   . Coronary artery disease Maternal Grandmother   . Heart attack Maternal Grandmother   . Diabetes Maternal Grandmother   . Lung cancer Maternal Grandfather   . Emphysema Maternal Grandfather   . Coronary artery disease Maternal Grandfather   . Lupus Unknown        Aunt  . Fibromyalgia Unknown        Aunt   Family Psychiatric  History: See H&P Social History:  Social History   Substance and Sexual Activity  Alcohol Use Yes  . Alcohol/week: 1.2 - 1.8 oz  . Types: 2 Glasses of wine per week   Comment: occasional < 1 month     Social History   Substance and Sexual Activity  Drug Use No    Social History   Socioeconomic History  . Marital status: Married    Spouse name: brian  . Number of children: 2  . Years of education: Not on file  . Highest education level: Some college, no degree  Occupational History  . Occupation: Arts development officer  Social Needs  . Financial resource strain: Not hard at all  . Food insecurity:    Worry: Never true    Inability: Never true  . Transportation needs:    Medical: No    Non-medical: No  Tobacco Use  . Smoking status: Never Smoker  . Smokeless tobacco: Never Used  Substance and Sexual Activity  . Alcohol use: Yes    Alcohol/week: 1.2 - 1.8 oz    Types: 2 Glasses of wine per week    Comment: occasional < 1 month  . Drug use: No  . Sexual activity: Yes    Partners: Male    Birth control/protection: None, IUD  Lifestyle  . Physical activity:    Days per week: 0 days    Minutes per session: 0 min  . Stress: To some extent  Relationships  . Social connections:    Talks on phone: More than three  times a week    Gets together: Once a week    Attends religious service: Never    Active member of club or organization: No    Attends meetings of clubs or organizations: Never    Relationship status: Married  Other Topics Concern  . Not on file  Social History Narrative   1 child, 2 step-sons      Regular exercise-no   Diet: no fast food, likes sweets    Hospital Course:  Pt was restarted on medications and Lexapro was increased to 15 mg daily for worsening anxiety. We discussed possible diagnoses including the possibility of borderline personality disorder which could be contributing to some of the impulsive behaviors. She did not appear manic or hypomanic at all through hospitalization. She was sleeping well. She attended all groups and has really good insight into herself. On day of discharge, she had  bright affect. She adamantly denied SI or thoughts of self harm. She slept well last night. She was future oriented and "has a game plan." She and her husband talked and she is going to reach out to him more when she is feeling upset. She also wants to focus on her children more and see her therapist more. She is going to call her therapist today and set up some appointments. She will see. Faheem on Monday. She was having some diarrhea this morning.g Discussed that it could be due to Lexapro but she is also on her menses which sometimes causes it for her. She felt ready for discharge.   The patient is at low risk of imminent suicide. Patient denied thoughts, intent, or plan for harm to self or others, expressed significant future orientation, and expressed an ability to mobilize assistance for her needs. She is presently void of any contributing psychiatric symptoms, cognitive difficulties, or substance use which would elevate her risk for lethality. Chronic risk for lethality is elevated in light of impulsivity, cluster B traits, stress with her relationships. The chronic risk is presently  mitigated by her ongoing desire and engagement in [facility] treatment and mobilization of support from family and friends. Chronic risk may elevate if she experiences any significant loss or worsening of symptoms, which can be managed and monitored through outpatient providers. At this time,a cute risk for lethality is low and she is stable for ongoing outpatient management.   Modifiable risk factors were addressed during this hospitalization through appropriate pharmacotherapy and establishment of outpatient follow-up treatment. Some risk factors for suicide are situational (i.e. Unstable housing) or related personality pathology (i.e. Poor coping mechanisms) and thus cannot be further mitigated by continued hospitalization in this setting.    Physical Findings: AIMS: Facial and Oral Movements Muscles of Facial Expression: None, normal Lips and Perioral Area: None, normal Jaw: None, normal Tongue: None, normal,Extremity Movements Upper (arms, wrists, hands, fingers): None, normal Lower (legs, knees, ankles, toes): None, normal, Trunk Movements Neck, shoulders, hips: None, normal, Overall Severity Severity of abnormal movements (highest score from questions above): None, normal Incapacitation due to abnormal movements: None, normal Patient's awareness of abnormal movements (rate only patient's report): No Awareness, Dental Status Current problems with teeth and/or dentures?: No Does patient usually wear dentures?: No  CIWA:  CIWA-Ar Total: 2 COWS:  COWS Total Score: 1  Musculoskeletal: Strength & Muscle Tone: within normal limits Gait & Station: normal Patient leans: N/A  Psychiatric Specialty Exam: Physical Exam  Nursing note and vitals reviewed.   Review of Systems  All other systems reviewed and are negative.   Blood pressure 134/74, pulse 65, temperature 98.3 F (36.8 C), temperature source Oral, resp. rate 18, height  (1.626 m), weight 129.3 kg (285 lb), last menstrual period  05/21/2017, SpO2 100 %.Body mass index is 48.92 kg/m.  General Appearance: Casual  Eye Contact:  Good  Speech:  Clear and Coherent  Volume:  Normal  Mood:  Euthymic  Affect:  Appropriate  Thought Process:  Coherent and Goal Directed  Orientation:  Full (Time, Place, and Person)  Thought Content:  Logical  Suicidal Thoughts:  No  Homicidal Thoughts:  No  Memory:  Immediate;   Fair  Judgement:  Fair  Insight:  Good  Psychomotor Activity:  Normal  Concentration:  Concentration: Good  Recall:  Good  Fund of Knowledge:  Good  Language:  Good  Akathisia:  No      Assets:  Resilience  ADL's:  Intact  Cognition:  WNL  Sleep:  Number of Hours: 7.75     Have you used any form of tobacco in the last 30 days? (Cigarettes, Smokeless Tobacco, Cigars, and/or Pipes): No  Has this patient used any form of tobacco in the last 30 days? (Cigarettes, Smokeless Tobacco, Cigars, and/or Pipes) No  Blood Alcohol level:  Lab Results  Component Value Date   ETH <10 05/21/2017    Metabolic Disorder Labs:  Lab Results  Component Value Date   HGBA1C 4.7 (L) 05/22/2017   MPG 88.19 05/22/2017   No results found for: PROLACTIN Lab Results  Component Value Date   CHOL 105 05/22/2017   TRIG 41 05/22/2017   HDL 48 05/22/2017   CHOLHDL 2.2 05/22/2017   VLDL 8 05/22/2017   LDLCALC 49 05/22/2017   LDLCALC 99 09/11/2013    See Psychiatric Specialty Exam and Suicide Risk Assessment completed by Attending Physician prior to discharge.  Discharge destination:  Home  Is patient on multiple antipsychotic therapies at discharge:  No   Has Patient had three or more failed trials of antipsychotic monotherapy by history:  No  Recommended Plan for Multiple Antipsychotic Therapies: NA  Discharge Instructions    Increase activity slowly   Complete by:  As directed      Allergies as of 05/24/2017      Reactions   Molds & Smuts    Pollen Extract    Uncaria Tomentosa (cats Claw)    Milk-related  Compounds    Able to tolerate yogurt; mild intolerance to milk/cheese   Pineapple    Soy Allergy    Strawberry Extract    Break out on tongue noted      Medication List    STOP taking these medications   SAVELLA 100 MG Tabs tablet Generic drug:  Milnacipran HCl     TAKE these medications     Indication  acetaminophen 500 MG tablet Commonly known as:  TYLENOL Take 500 mg by mouth every 6 (six) hours as needed for mild pain.  Indication:  Pain   ARIPiprazole 5 MG tablet Commonly known as:  ABILIFY Take 1 tablet (5 mg total) by mouth daily.  Indication:  Mood   BIOTIN PLUS KERATIN PO Take 1 tablet by mouth daily.  Indication:  Biotin   calcium citrate-vitamin D 500-400 MG-UNIT chewable tablet Chew 1 tablet by mouth daily.  Indication:  CA   cetirizine 10 MG tablet Commonly known as:  ZYRTEC Take 10 mg by mouth daily.  Indication:  Perennial Allergic Rhinitis   COPPER PO by Intrauterine route.  Indication:  a   escitalopram 10 MG tablet Commonly known as:  LEXAPRO Take 1.5 tablets (15 mg total) by mouth daily. What changed:  how much to take  Indication:  Generalized Anxiety Disorder   eszopiclone 2 MG Tabs tablet Commonly known as:  LUNESTA Take 1 tablet (2 mg total) by mouth at bedtime as needed for sleep. Take immediately before bedtime  Indication:  Trouble Sleeping   hydrOXYzine 25 MG tablet Commonly known as:  ATARAX/VISTARIL Take 1 tablet (25 mg total) by mouth 3 (three) times daily as needed for anxiety.  Indication:  Feeling Anxious   lamoTRIgine 100 MG tablet Commonly known as:  LAMICTAL Take 1 tablet (100 mg total) by mouth daily.  Indication:  Mood   montelukast 10 MG tablet Commonly known as:  SINGULAIR Take 10 mg by mouth daily.  Indication:  Hayfever   multivitamin tablet Take 1 tablet  by mouth daily.  Indication:  Vitamin   NIFEdipine 60 MG 24 hr tablet Commonly known as:  PROCARDIA-XL/ADALAT CC Take 60 mg by mouth daily.   Indication:  Raynaud's Disease   norethindrone 0.35 MG tablet Commonly known as:  MICRONOR,CAMILA,ERRIN Take 1 tablet (0.35 mg total) by mouth daily.  Indication:  Birth Control Treatment   omeprazole 20 MG capsule Commonly known as:  PRILOSEC Take 20 mg by mouth daily.  Indication:  Gastroesophageal Reflux Disease      Follow-up Information    [facility] Go on 05/27/2017.   Why:  Please go to your hospital follow up appointment with your doctor on Monday, 05/27/17 at 9AM. Thank you!          Follow-up recommendations: See Above  Signed: Haskell Riling, MD 05/24/2017, 8:57 AM

## 2017-05-24 NOTE — Progress Notes (Signed)
Data: Patient is calm and cooperative, denies SI/HI/AVH at this time. Patient is apprpriate Patient has no complaints and a pain rating of 0/10. Patient rates depression 0/10, Feelings of hopelessness 0/10 and Anxiety 2/10. Patients goal for today is to prepare to go home to her family.  Action:  Q x 15 minute observation checks were completed for safety. Patient was provided with education on medications. Patient was offered support and encouragement. Patient was given scheduled medications. Patient  was encourage to attend groups, participate in unit activities and continue with plan of care.    Response: Patient has no complaints at this time. Patient is receptive to treatment and safety maintained on unit. Patient is prepared for Discharge.

## 2017-05-24 NOTE — Progress Notes (Signed)
Patient was provided with discharge summary, Transition packet, and Suicide Risk Assessment. Verbalized discharge summary, transition packet and suicide risk assessment, patient made aware of any change in medications, and all upcoming appointments.   Patient belongings/money returned. Rx provided to patient.  Patient denied any SI, denies plans for self harm or the harm of others. Patient is calm, with appropriate affect and eye contact. Patient reports no complaints at this time.   Patient will be discharged to husband @ 1051 am.

## 2017-05-24 NOTE — Progress Notes (Signed)
  [facility] Adult Case Management Discharge Plan :  Will you be returning to the same living situation after discharge:  Yes,  returning home.  At discharge, do you have transportation home?: Yes,  ex-husband Do you have the ability to pay for your medications: Yes,  insurance  Release of information consent forms completed and in the chart;  Patient's signature needed at discharge.  Patient to Follow up at: Follow-up Information    [facility] Go on 05/27/2017.   Why:  Please go to your hospital follow up appointment with your doctor on Monday, 05/27/17 at 9AM. Thank you!          Next level of care provider has access to [facility] Link:yes  Safety Planning and Suicide Prevention discussed: Yes,  with pt and her husband  Have you used any form of tobacco in the last 30 days? (Cigarettes, Smokeless Tobacco, Cigars, and/or Pipes): No  Has patient been referred to the Quitline?: N/A patient is not a smoker  Patient has been referred for addiction treatment: N/A  Heidi Dach, LCSW 05/24/2017, 8:55 AM

## 2017-05-24 NOTE — Plan of Care (Signed)
  Problem: Coping: Goal: Coping ability will improve Outcome: Progressing Goal: Will verbalize feelings Outcome: Progressing  Patient appears less anxious, interacting appropriately with peers and staff.

## 2017-05-24 NOTE — [facility] Suicide Risk Assessment (Signed)
[facility] Discharge Suicide Risk Assessment   Principal Problem: Major depressive disorder, recurrent episode with mixed features [facility]) Discharge Diagnoses:  Patient Active Problem List   Diagnosis Date Noted  . Major depressive disorder, recurrent episode with mixed features (HCC) [F33.9] 05/22/2017    Priority: High  . GAD (generalized anxiety disorder) [F41.1] 05/22/2017    Priority: High  . Acetaminophen overdose [T39.1X1A] 05/21/2017  . Left ureteral calculus [N20.1] 04/25/2016  . Tingling in extremities [R20.2] 08/29/2015  . Acute nonintractable headache [R51] 08/29/2015  . Visual disturbance [H53.9] 08/23/2015  . Polydipsia [R63.1] 03/23/2014  . Binge eating disorder [F50.81] 03/23/2014  . Vaginitis and vulvovaginitis [N76.0] 12/31/2013  . Paronychia [IMO0002] 09/21/2013  . Essential hypertension, benign [I10] 06/05/2013  . Bilateral leg pain [M79.604, M79.605] 05/22/2013  . Right carpal tunnel syndrome [G56.01] 09/16/2012  . General counseling and advice on female contraception [Z30.09] 01/22/2012  . Anhydramnios [O41.00X0] 01/02/2012  . Umbilical cord complication [O69.9XX0] 12/07/2011  . High-risk pregnancy [O09.90] 11/09/2011  . H/O cesarean section [Z98.891] 10/12/2011  . Calculus of kidney [N20.0] 10/12/2011  . Obesity affecting pregnancy, antepartum [O99.210] 10/12/2011  . History of cesarean section [Z98.891] 10/12/2011  . SNORING [R06.09, R09.89] 07/23/2007  . EDEMA [R60.9] 06/11/2007  . Morbid obesity (HCC) [E66.01] 12/23/2006  . GERD [K21.9] 12/23/2006  . MILD/UNSPEC PRE-ECLAMPSIA UNSPEC AS EPISODE CARE [IMO0002] 12/23/2006  . Fibromyalgia [M79.7] 12/23/2006    Mental Status Per Nursing Assessment::   On Admission:     Demographic Factors:  Caucasian and Unemployed  Loss Factors: Decrease in vocational status  Historical Factors: Impulsivity  Risk Reduction Factors:   Living with another person, especially a relative, Positive social support, Positive  therapeutic relationship and Positive coping skills or problem solving skills  Continued Clinical Symptoms:  Personality Disorders:   Cluster B  Cognitive Features That Contribute To Risk:  None    Suicide Risk:  Minimal: No identifiable suicidal ideation.    Follow-up Information    [facility] Go on 05/27/2017.   Why:  Please go to your hospital follow up appointment with your doctor on Monday, 05/27/17 at 9AM. Thank you!          Plan Of Care/Follow-up recommendations: Follow up with Dr. Garnetta Buddy on Monday. Also schedule appointment with your therapist  Haskell Riling, MD 05/24/2017, 8:56 AM

## 2017-05-27 ENCOUNTER — Ambulatory Visit (INDEPENDENT_AMBULATORY_CARE_PROVIDER_SITE_OTHER): Admitting: Psychiatry

## 2017-05-27 ENCOUNTER — Other Ambulatory Visit: Payer: Self-pay

## 2017-05-27 ENCOUNTER — Encounter: Payer: Self-pay | Admitting: Psychiatry

## 2017-05-27 VITALS — BP 142/75 | HR 85 | Temp 98.9°F | Wt 289.0 lb

## 2017-05-27 DIAGNOSIS — F411 Generalized anxiety disorder: Secondary | ICD-10-CM

## 2017-05-27 DIAGNOSIS — F3160 Bipolar disorder, current episode mixed, unspecified: Secondary | ICD-10-CM

## 2017-05-27 MED ORDER — ARIPIPRAZOLE 10 MG PO TABS: 10.0000 mg | ORAL_TABLET | Freq: Every day | ORAL | 1 refills | Status: DC

## 2017-05-27 MED ORDER — ESCITALOPRAM OXALATE 20 MG PO TABS: 20.0000 mg | ORAL_TABLET | Freq: Every day | ORAL | 1 refills | Status: DC

## 2017-05-27 NOTE — Progress Notes (Signed)
Psychiatric Follow Up Notes.   Patient Identification: Sierra [name] MRN:  [id number] Date of Evaluation:  05/27/2017 [facility] Chief Complaint:   Chief Complaint    Anxiety; Follow-up; Medication Refill; [facility] FOLLOW UP     Visit Diagnosis:  Bipolar Disorder, Mixed.  Binge Eating Disorder   Diagnosis:   Patient Active Problem List   Diagnosis Date Noted  . Major depressive disorder, recurrent episode with mixed features (HCC) [F33.9] 05/22/2017  . GAD (generalized anxiety disorder) [F41.1] 05/22/2017  . Acetaminophen overdose [T39.1X1A] 05/21/2017  . Left ureteral calculus [N20.1] 04/25/2016  . Tingling in extremities [R20.2] 08/29/2015  . Acute nonintractable headache [R51] 08/29/2015  . Visual disturbance [H53.9] 08/23/2015  . Polydipsia [R63.1] 03/23/2014  . Binge eating disorder [F50.81] 03/23/2014  . Vaginitis and vulvovaginitis [N76.0] 12/31/2013  . Paronychia [IMO0002] 09/21/2013  . Essential hypertension, benign [I10] 06/05/2013  . Bilateral leg pain [M79.604, M79.605] 05/22/2013  . Right carpal tunnel syndrome [G56.01] 09/16/2012  . General counseling and advice on female contraception [Z30.09] 01/22/2012  . Anhydramnios [O41.00X0] 01/02/2012  . Umbilical cord complication [O69.9XX0] 12/07/2011  . High-risk pregnancy [O09.90] 11/09/2011  . H/O cesarean section [Z98.891] 10/12/2011  . Calculus of kidney [N20.0] 10/12/2011  . Obesity affecting pregnancy, antepartum [O99.210] 10/12/2011  . History of cesarean section [Z98.891] 10/12/2011  . SNORING [R06.09, R09.89] 07/23/2007  . EDEMA [R60.9] 06/11/2007  . Morbid obesity (HCC) [E66.01] 12/23/2006  . GERD [K21.9] 12/23/2006  . MILD/UNSPEC PRE-ECLAMPSIA UNSPEC AS EPISODE CARE [IMO0002] 12/23/2006  . Fibromyalgia [M79.7] 12/23/2006   History of Present Illness:    Pt is a 37  year-old married  female who presented for  follow-up appointment. She was recently discharged from  the inpatient behavioral health unit where she was admitted for 4 days status post suicide attempt.  Patient reported that she went for a walk early in the morning and at that time she decided that she is going to hurt herself.  After she took the pills he told her husband who called for help.  Patient reported that she was feeling anxious and depressed.  She reported that she has relationship problems with her husband although she has done therapy in the past but it was not helpful.  She reported that the therapist told them that sometimes people "Fall out of the relationship and she is 1 of them".  She reported that she currently lives with her husband but they are separated since January.  She stated that she has a poor relationship with her stepson's wife who has moved with them. She reported that her husband is her best friend and she talks everything to him and he is a good support.  However she does not care about him.  She reported that she also has friends in the neighborhood.  She reported that she feels anxious depressed.  She reported that her Lexapro dose was increased to 15 mg but she feels that it is not enough and wants to go higher on the dose of the medication.  She currently denied having any suicidal or homicidal ideations or plans.   Discussed about her relationship issues as well as her anxiety and she agreed with the plan to start having some therapy and also to start talking to her husband and she agreed with the plan.               Past Medical History:  Past Medical History:  Diagnosis Date  . Abdominal pain, other specified  site   . 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 Anxiety state, unspecified   . Bipolar 1 disorder, depressed (HCC) 05/21/2017  . BIPOLAR AFFECTIVE DISORDER 12/23/2006   Qualifier: Diagnosis of  By: Ermalene Searing MD, Amy    . Bipolar affective disorder (HCC) 10/12/2011  . Bipolar disorder, unspecified (HCC)   . Depressive disorder, not elsewhere  classified   . Edema   . Esophageal reflux   . Fibromyalgia   . Headache   . History of kidney stones   . Mild or unspecified pre-eclampsia, unspecified as to episode of care   . Morbid obesity (HCC)   . Myalgia and myositis, unspecified   . Other dyspnea and respiratory abnormality   . Preeclampsia    with first pregnancy  . Raynaud's disease   . Urinary tract infection, site not specified     Past Surgical History:  Procedure Laterality Date  . CESAREAN SECTION     X 2  . CHOLECYSTECTOMY  03/2006  . CYSTOSCOPY W/ URETERAL STENT PLACEMENT Left 05/14/2016   Procedure: CYSTOSCOPY WITH STENT REPLACEMENT;  Surgeon: [name], MD;  Location: [facility] ORS;  Service: Urology;  Laterality: Left;  . CYSTOSCOPY WITH STENT PLACEMENT Left 04/25/2016   Procedure: CYSTOSCOPY WITH STENT PLACEMENT;  Surgeon: [name], MD;  Location: [facility] ORS;  Service: Urology;  Laterality: Left;  . DILATION AND CURETTAGE OF UTERUS    . IVC FILTER INSERTION  03/26/2016   [facility]  . LAPAROSCOPIC GASTRIC RESTRICTIVE DUODENAL PROCEDURE (DUODENAL SWITCH)  03/2016  . LITHOTRIPSY    . TONSILLECTOMY AND ADENOIDECTOMY    . URETEROSCOPY WITH HOLMIUM LASER LITHOTRIPSY Left 05/14/2016   Procedure: URETEROSCOPY WITH HOLMIUM LASER LITHOTRIPSY;  Surgeon: [name], MD;  Location: [facility] ORS;  Service: Urology;  Laterality: Left;   Social History:   Social History   Socioeconomic History  . Marital status: Married    Spouse name: brian  . Number of children: 2  . Years of education: Not on file  . Highest education level: Some college, no degree  Occupational History  . Occupation: Arts development officer  Social Needs  . Financial resource strain: Not hard at all  . Food insecurity:    Worry: Never true    Inability: Never true  . Transportation needs:    Medical: No    Non-medical: No  Tobacco Use  . Smoking status: Never Smoker  . Smokeless tobacco: Never Used  Substance and Sexual Activity  . Alcohol use:  Yes    Alcohol/week: 1.2 - 1.8 oz    Types: 2 Glasses of wine per week    Comment: occasional < 1 month  . Drug use: No  . Sexual activity: Yes    Partners: Male    Birth control/protection: None, IUD  Lifestyle  . Physical activity:    Days per week: 0 days    Minutes per session: 0 min  . Stress: To some extent  Relationships  . Social connections:    Talks on phone: More than three times a week    Gets together: Once a week    Attends religious service: Never    Active member of club or organization: No    Attends meetings of clubs or organizations: Never    Relationship status: Married  Other Topics Concern  . Not on file  Social History Narrative   1 child, 2 step-sons      Regular exercise-no   Diet: no fast food, likes sweets   Additional  Social History:  Married X 11 years. Has  Two sons ages  1 and 68.  Has 2 step sons ages 72 and 2. 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 Husband is a retired Administrator, Civil Service and has PTSD.    Musculoskeletal: Strength & Muscle Tone: within normal limits Gait & Station: normal Patient leans: N/A  Psychiatric Specialty Exam: Review of Systems  Constitutional: Positive for malaise/fatigue and weight loss.  Eyes: Negative.   Respiratory: Negative.   Cardiovascular: Negative for palpitations and leg swelling.  Gastrointestinal: Negative.   Genitourinary: Negative.   Musculoskeletal: Negative for back pain, joint pain and neck pain.  Skin: Negative for rash.  Neurological: Negative.   Psychiatric/Behavioral: Positive for depression. Negative for suicidal ideas. The patient is nervous/anxious. The patient does not have insomnia.     Blood pressure (!) 142/75, pulse 85, temperature 98.9 F (37.2 C), temperature source Oral, weight 289 lb (131.1 kg), last menstrual period 05/21/2017.Body mass index is 49.61 kg/m.  General Appearance: Casual  Eye Contact:  Fair  Speech:  Clear and Coherent  Volume:  Normal  Mood:  Anxious  Affect:   Congruent  Thought Process:  Coherent  Orientation:  Full (Time, Place, and Person)  Thought Content:  WDL  Suicidal Thoughts:  No  Homicidal Thoughts:  No  Memory:  Immediate;   Fair  Judgement:  Fair  Insight:  Fair  Psychomotor Activity:  Normal  Concentration:  Fair  Recall:  [employer] of Knowledge:Fair  Language: Fair  Akathisia:  No  Handed:  Right  AIMS (if indicated):    Assets:  Communication Skills Desire for Improvement Social Support  ADL's:  Intact  Cognition: WNL  Sleep:  Difficulty going to sleep   Is the patient at risk to self?  No. Has the patient been a risk to self in the past 6 months?  No. Has the patient been a risk to self within the distant past?  No. Is the patient a risk to others?  No. Has the patient been a risk to others in the past 6 months?  No. Has the patient been a risk to others within the distant past?  No.  Allergies:   Allergies  Allergen Reactions  . Molds & Smuts   . Pollen Extract   . Uncaria Tomentosa (Cats Claw)   . Milk-Related Compounds     Able to tolerate yogurt; mild intolerance to milk/cheese  . Pineapple   . Soy Allergy   . Strawberry Extract     Break out on tongue noted   Current Medications: Current Outpatient Medications  Medication Sig Dispense Refill  . ARIPiprazole (ABILIFY) 10 MG tablet Take 1 tablet (10 mg total) by mouth daily. 30 tablet 1  . calcium citrate-vitamin D 500-400 MG-UNIT chewable tablet Chew 1 tablet by mouth daily.     . cetirizine (ZYRTEC) 10 MG tablet Take 10 mg by mouth daily.     Marland Kitchen escitalopram (LEXAPRO) 20 MG tablet Take 1 tablet (20 mg total) by mouth daily. 30 tablet 1  . hydrOXYzine (ATARAX/VISTARIL) 25 MG tablet Take 1 tablet (25 mg total) by mouth 3 (three) times daily as needed for anxiety. 7 tablet 0  . Lactobacillus Rhamnosus, GG, (CULTURELLE) CAPS Take by mouth.    . lamoTRIgine (LAMICTAL) 100 MG tablet Take 1 tablet (100 mg total) by mouth daily. 90 tablet 1  . montelukast  (SINGULAIR) 10 MG tablet Take 10 mg by mouth daily.    . Multiple  Vitamin (MULTIVITAMIN) tablet Take 1 tablet by mouth daily.    Marland Kitchen NIFEdipine (PROCARDIA-XL/ADALAT CC) 60 MG 24 hr tablet Take 60 mg by mouth daily.     . norethindrone (MICRONOR,CAMILA,ERRIN) 0.35 MG tablet Take 1 tablet (0.35 mg total) by mouth daily. 3 Package 4  . omeprazole (PRILOSEC) 20 MG capsule Take 20 mg by mouth daily.    Marland Kitchen PARAGARD INTRAUTERINE COPPER IUD IUD by Intrauterine route.    Marland Kitchen Specialty Vitamins Products (BIOTIN PLUS KERATIN PO) Take 1 tablet by mouth daily.     No current facility-administered medications for this visit.       Substance Abuse History in the last 12 months:  No.  Consequences of Substance Abuse: NA  Medical Decision Making:  Review of Psycho-Social Stressors (1) and Established Problem, Worsening (2)  Treatment Plan Summary: Medication management   Continue medication as follows  Increase  Lexapro 20 mg daily.-  Continue  Abilify 10 mg daily. Continue lamotrigine  100 mg daily-  D/c Lunesta 2 mg p.o. nightly.   And was given hydroxyzine as needed at the time of discharge from the hospital and she is taking them as needed.    Discussed with her about the side effects of the medication.  She agreed with the plan.     She will follow-up in 3 weeks or earlier depending on her symptoms.  Discussed with her about the side effects of medications and she demonstrated understanding     More than 50% of the time spent in psychoeducation, counseling and coordination of care.     This note was generated in part or whole with voice recognition software. Voice regonition is usually quite accurate but there are transcription errors that can and very often do occur. I apologize for any typographical errors that were not detected and corrected.    Brandy Hale, MD  5/13/20199:37 AM

## 2017-06-13 ENCOUNTER — Telehealth (HOSPITAL_COMMUNITY): Payer: Self-pay | Admitting: Professional

## 2017-06-17 ENCOUNTER — Ambulatory Visit: Admitting: Psychiatry

## 2017-06-20 ENCOUNTER — Other Ambulatory Visit: Payer: Self-pay

## 2017-06-20 ENCOUNTER — Ambulatory Visit (INDEPENDENT_AMBULATORY_CARE_PROVIDER_SITE_OTHER): Admitting: Psychiatry

## 2017-06-20 ENCOUNTER — Encounter: Payer: Self-pay | Admitting: Psychiatry

## 2017-06-20 VITALS — BP 123/79 | HR 55 | Temp 97.4°F | Wt 291.6 lb

## 2017-06-20 DIAGNOSIS — F3160 Bipolar disorder, current episode mixed, unspecified: Secondary | ICD-10-CM

## 2017-06-20 DIAGNOSIS — F5081 Binge eating disorder: Secondary | ICD-10-CM

## 2017-06-20 DIAGNOSIS — F411 Generalized anxiety disorder: Secondary | ICD-10-CM | POA: Diagnosis not present

## 2017-06-20 DIAGNOSIS — F50819 Binge eating disorder, unspecified: Secondary | ICD-10-CM

## 2017-06-20 MED ORDER — BUPROPION HCL ER (XL) 150 MG PO TB24: 150.0000 mg | ORAL_TABLET | Freq: Every day | ORAL | 1 refills | Status: DC

## 2017-06-20 MED ORDER — ARIPIPRAZOLE 15 MG PO TABS: 15.0000 mg | ORAL_TABLET | Freq: Every day | ORAL | 1 refills | Status: DC

## 2017-06-20 MED ORDER — TRAZODONE HCL 100 MG PO TABS: 100.0000 mg | ORAL_TABLET | Freq: Every evening | ORAL | 1 refills | Status: DC | PRN

## 2017-06-20 MED ORDER — HYDROXYZINE HCL 25 MG PO TABS: 25.0000 mg | ORAL_TABLET | Freq: Every day | ORAL | 1 refills | Status: DC | PRN

## 2017-06-20 MED ORDER — ESCITALOPRAM OXALATE 20 MG PO TABS: 20.0000 mg | ORAL_TABLET | Freq: Every day | ORAL | 1 refills | Status: DC

## 2017-06-20 NOTE — Progress Notes (Signed)
BH MD OP Progress Note  06/20/2017 3:46 PM ROSCHELLE [name]  MRN:  [id number]  Chief Complaint: ' I am here for follow  Up." Chief Complaint    Follow-up; Medication Refill     HPI: Sierra [name] is a 37 year old female, married, lives in [place], has a history of bipolar disorder, anxiety disorder, binge eating disorder, presented to the clinic today for a follow-up visit.  Patient used to be under the care of Dr. Garnetta Buddy.  This is patient's first visit with Clinical research associate.  Recent reports she ended up in the hospital again at [facility] end of May.  Patient reports at that time she felt bad about herself and decided to get help.  Patient prior to that had another inpatient admission beginning of May at [facility].  Patient reports at that time she was suicidal.  Patient reports she currently is doing well on the current medication regimen.  She reports the [facility] hospital really made a big difference for her.  She reports she is tolerating the medications well.  She reports she understands that she has cognitive distortions and she is currently working on them.  She reports she is sleeping well.  She denies any significant mood lability.  She denies any suicidality.  She reports that she and her husband continue to stay in the same house even though they are separated.  Patient reports they do that so that they can coparent the children.  Patient reports she has 2 biological children and 2 stepsons.  Patient reports a history of binge eating disorder.  Patient however reports it is currently under control.  She reports she would be interested in medication which she had discussed with Dr. Garnetta Buddy previously called Topamax.  She however reports she can wait since she does not think her binge eating is a big problem at this time.   Visit Diagnosis:    ICD-10-CM   1. Bipolar I disorder, most recent episode mixed (HCC) F31.60   2. GAD (generalized anxiety disorder) F41.1   3. Binge eating disorder  F50.81     Past Psychiatric History: Patient used to be under the care of Dr. Garnetta Buddy in the past.  Patient has had inpatient mental health admissions in the past.  Most recently at [facility] beginning of May 2019 and [facility] on Jun 12, 2017.  Pt reports 1 suicide attempt in the past.  Past Medical History:  Past Medical History:  Diagnosis Date  . Abdominal pain, other specified site   . 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 Anxiety state, unspecified   . Bipolar 1 disorder, depressed (HCC) 05/21/2017  . BIPOLAR AFFECTIVE DISORDER 12/23/2006   Qualifier: Diagnosis of  By: Ermalene Searing MD, Amy    . Bipolar affective disorder (HCC) 10/12/2011  . Bipolar disorder, unspecified (HCC)   . Depressive disorder, not elsewhere classified   . Edema   . Esophageal reflux   . Fibromyalgia   . Headache   . History of kidney stones   . Mild or unspecified pre-eclampsia, unspecified as to episode of care   . Morbid obesity (HCC)   . Myalgia and myositis, unspecified   . Other dyspnea and respiratory abnormality   . Preeclampsia    with first pregnancy  . Raynaud's disease   . Urinary tract infection, site not specified     Past Surgical History:  Procedure Laterality Date  . CESAREAN SECTION     X 2  . CHOLECYSTECTOMY  03/2006  . CYSTOSCOPY  W/ URETERAL STENT PLACEMENT Left 05/14/2016   Procedure: CYSTOSCOPY WITH STENT REPLACEMENT;  Surgeon: Vanna ScotlandAshley Brandon, MD;  Location: [facility] ORS;  Service: Urology;  Laterality: Left;  . CYSTOSCOPY WITH STENT PLACEMENT Left 04/25/2016   Procedure: CYSTOSCOPY WITH STENT PLACEMENT;  Surgeon: Vanna ScotlandAshley Brandon, MD;  Location: [facility] ORS;  Service: Urology;  Laterality: Left;  . DILATION AND CURETTAGE OF UTERUS    . IVC FILTER INSERTION  03/26/2016   [facility]  . LAPAROSCOPIC GASTRIC RESTRICTIVE DUODENAL PROCEDURE (DUODENAL SWITCH)  03/2016  . LITHOTRIPSY    . TONSILLECTOMY AND ADENOIDECTOMY    . URETEROSCOPY WITH HOLMIUM LASER LITHOTRIPSY Left 05/14/2016    Procedure: URETEROSCOPY WITH HOLMIUM LASER LITHOTRIPSY;  Surgeon: Vanna ScotlandAshley Brandon, MD;  Location: [facility] ORS;  Service: Urology;  Laterality: Left;    Family Psychiatric History: Patient reports her mother and father struggle with depression.  Patient reports her father used to be an alcoholic and committed suicide when she was 37 years old.  Family History:  Family History  Problem Relation Age of Onset  . Depression Father   . Alcohol abuse Father   . Depression Mother   . Hyperlipidemia Mother   . Sleep apnea Mother   . Depression Sister   . Hyperlipidemia Brother   . Depression Brother   . Hyperlipidemia Brother   . Depression Brother   . Alcohol abuse Brother   . Coronary artery disease Maternal Grandmother   . Heart attack Maternal Grandmother   . Diabetes Maternal Grandmother   . Lung cancer Maternal Grandfather   . Emphysema Maternal Grandfather   . Coronary artery disease Maternal Grandfather   . Lupus Unknown        Aunt  . Fibromyalgia Unknown        Aunt    Social History: She is married however she reports they are separated and  they live at the same house.  They live together so that they can coparent.  Patient reports she is trying to go back to college soon.  She has actually submitted her application.  She has 2 biological children and 2 stepchildren. Social History   Socioeconomic History  . Marital status: Married    Spouse name: Sierra [name]  . Number of children: 2  . Years of education: Not on file  . Highest education level: Some college, no degree  Occupational History  . Occupation: Arts development officerHome maker  Social Needs  . Financial resource strain: Not hard at all  . Food insecurity:    Worry: Never true    Inability: Never true  . Transportation needs:    Medical: No    Non-medical: No  Tobacco Use  . Smoking status: Never Smoker  . Smokeless tobacco: Never Used  Substance and Sexual Activity  . Alcohol use: Yes    Alcohol/week: 1.2 - 1.8 oz    Types: 2  Glasses of wine per week    Comment: occasional < 1 month  . Drug use: No  . Sexual activity: Yes    Partners: Male    Birth control/protection: None, IUD  Lifestyle  . Physical activity:    Days per week: 0 days    Minutes per session: 0 min  . Stress: To some extent  Relationships  . Social connections:    Talks on phone: More than three times a week    Gets together: Once a week    Attends religious service: Never    Active member of club or organization: No  Attends meetings of clubs or organizations: Never    Relationship status: Married  Other Topics Concern  . Not on file  Social History Narrative   1 child, 2 step-sons      Regular exercise-no   Diet: no fast food, likes sweets    Allergies:  Allergies  Allergen Reactions  . Molds & Smuts   . Pollen Extract   . Uncaria Tomentosa (Cats Claw)   . Milk-Related Compounds     Able to tolerate yogurt; mild intolerance to milk/cheese  . Pineapple   . Soy Allergy   . Strawberry Extract     Break out on tongue noted    Metabolic Disorder Labs: Lab Results  Component Value Date   HGBA1C 4.7 (L) 05/22/2017   MPG 88.19 05/22/2017   No results found for: PROLACTIN Lab Results  Component Value Date   CHOL 105 05/22/2017   TRIG 41 05/22/2017   HDL 48 05/22/2017   CHOLHDL 2.2 05/22/2017   VLDL 8 05/22/2017   LDLCALC 49 05/22/2017   LDLCALC 99 09/11/2013   Lab Results  Component Value Date   TSH 0.677 05/22/2017   TSH 3.71 10/01/2016    Therapeutic Level Labs: No results found for: LITHIUM No results found for: VALPROATE No components found for:  CBMZ  Current Medications: Current Outpatient Medications  Medication Sig Dispense Refill  . ARIPiprazole (ABILIFY) 15 MG tablet Take 1 tablet (15 mg total) by mouth daily. 90 tablet 1  . buPROPion (WELLBUTRIN XL) 150 MG 24 hr tablet Take 1 tablet (150 mg total) by mouth daily. 90 tablet 1  . calcium citrate-vitamin D 500-400 MG-UNIT chewable tablet Chew 1  tablet by mouth daily.     . cetirizine (ZYRTEC) 10 MG tablet Take 10 mg by mouth daily.     Marland Kitchen escitalopram (LEXAPRO) 20 MG tablet Take 1 tablet (20 mg total) by mouth daily. 90 tablet 1  . hydrOXYzine (ATARAX/VISTARIL) 25 MG tablet Take 1 tablet (25 mg total) by mouth daily as needed for anxiety. 90 tablet 1  . Lactobacillus Rhamnosus, GG, (CULTURELLE) CAPS Take by mouth.    . lamoTRIgine (LAMICTAL) 100 MG tablet Take 1 tablet (100 mg total) by mouth daily. 90 tablet 1  . montelukast (SINGULAIR) 10 MG tablet Take 10 mg by mouth daily.    . Multiple Vitamin (MULTIVITAMIN) tablet Take 1 tablet by mouth daily.    Marland Kitchen NIFEdipine (PROCARDIA-XL/ADALAT CC) 60 MG 24 hr tablet Take 60 mg by mouth daily.     . norethindrone (MICRONOR,CAMILA,ERRIN) 0.35 MG tablet Take 1 tablet (0.35 mg total) by mouth daily. 3 Package 4  . omeprazole (PRILOSEC) 20 MG capsule Take 20 mg by mouth daily.    Marland Kitchen PARAGARD INTRAUTERINE COPPER IUD IUD by Intrauterine route.    Marland Kitchen Specialty Vitamins Products (BIOTIN PLUS KERATIN PO) Take 1 tablet by mouth daily.    . traZODone (DESYREL) 100 MG tablet Take 1 tablet (100 mg total) by mouth at bedtime as needed for sleep. 90 tablet 1   No current facility-administered medications for this visit.      Musculoskeletal: Strength & Muscle Tone: within normal limits Gait & Station: normal Patient leans: N/A  Psychiatric Specialty Exam: Review of Systems  Psychiatric/Behavioral: The patient is nervous/anxious (improving).   All other systems reviewed and are negative.   Blood pressure 123/79, pulse (!) 55, temperature (!) 97.4 F (36.3 C), temperature source Oral, weight 291 lb 9.6 oz (132.3 kg), last menstrual period 05/21/2017.Body mass index  is 50.05 kg/m.  General Appearance: Casual  Eye Contact:  Fair  Speech:  Normal Rate  Volume:  Normal  Mood:  Anxious and Dysphoric  Affect:  Congruent  Thought Process:  Goal Directed and Descriptions of Associations: Intact   Orientation:  Full (Time, Place, and Person)  Thought Content: Logical   Suicidal Thoughts:  No  Homicidal Thoughts:  No  Memory:  Immediate;   Fair Recent;   Fair Remote;   Fair  Judgement:  Fair  Insight:  Fair  Psychomotor Activity:  Normal  Concentration:  Concentration: Fair and Attention Span: Fair  Recall:  [employer] of Knowledge: Fair  Language: Fair  Akathisia:  No  Handed:  Right  AIMS (if indicated): 0  Assets:  Communication Skills Desire for Improvement Social Support  ADL's:  Intact  Cognition: WNL  Sleep:  varies   Screenings: AIMS     Admission (Discharged) from 05/21/2017 in [facility] INPATIENT BEHAVIORAL MEDICINE  AIMS Total Score  0    AUDIT     Admission (Discharged) from 05/21/2017 in [facility] INPATIENT BEHAVIORAL MEDICINE  Alcohol Use Disorder Identification Test Final Score (AUDIT)  6       Assessment and Plan: Tamaya is a 37 year old Caucasian female who has a history of bipolar disorder, anxiety disorder, binge eating disorder, presented to the clinic today for a follow-up visit.  Patient had multiple inpatient mental health admissions recently.  Patient however reports she is currently doing well on the current medication regimen.  She continues to have social support from her husband even though they are separated, lives in the same house.  Discussed plan as noted below.  Plan Bipolar disorder Continue Abilify 15 mg p.o. daily Continue Lamictal 100 mg p.o. daily Continue Wellbutrin XL 150 mg p.o. daily  For anxiety disorder Continue Lexapro 20 mg p.o. Daily Continue hydroxyzine 25 mg p.o. daily as needed for severe anxiety symptoms  For insomnia Trazodone 100 mg p.o. nightly as needed.  Reviewed discharge summary from [facility] from where she was discharged recently. Also reviewed progress note from Dr. Garnetta Buddy.  Follow up in clinic in 8 weeks or sooner if needed.  More than 50 % of the time was spent for psychoeducation and  supportive psychotherapy and care coordination.  This note was generated in part or whole with voice recognition software. Voice recognition is usually quite accurate but there are transcription errors that can and very often do occur. I apologize for any typographical errors that were not detected and corrected.       Jomarie Longs, MD 06/21/2017, 8:55 AM

## 2017-06-21 ENCOUNTER — Encounter: Payer: Self-pay | Admitting: Psychiatry

## 2017-07-22 ENCOUNTER — Encounter: Payer: Self-pay | Admitting: Women's Health

## 2017-07-22 ENCOUNTER — Ambulatory Visit (INDEPENDENT_AMBULATORY_CARE_PROVIDER_SITE_OTHER): Admitting: Women's Health

## 2017-07-22 VITALS — BP 128/78

## 2017-07-22 DIAGNOSIS — Z113 Encounter for screening for infections with a predominantly sexual mode of transmission: Secondary | ICD-10-CM

## 2017-07-22 LAB — WET PREP FOR TRICH, YEAST, CLUE

## 2017-07-22 NOTE — Progress Notes (Signed)
37 year old separated WF G5 P2 presents with request for STD screen.  Has not been sexually active since separation but questions husband's fidelity during marriage.  Negative STD screen 11/2016.  Denies urinary symptoms , vaginal discharge, abdominal pain or fever.  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  2018 ParaGard IUD with monthly cycles.  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  Primary care managing anxiety/depression, hypertension.  Exam: Appears well, obese.  External genitalia within normal limits, speculum exam scant white discharge, wet prep negative, GC/Chlamydia culture taken.  No CMT.  STD screen  Plan: GC/Chlamydia culture pending, HIV, hep B, C, RPR.  Counseling as needed.

## 2017-07-23 ENCOUNTER — Telehealth: Payer: Self-pay

## 2017-07-23 LAB — HEPATITIS C ANTIBODY
Hepatitis C Ab: NONREACTIVE
SIGNAL TO CUT-OFF: 0.01 (ref <1.00)

## 2017-07-23 LAB — HEPATITIS B SURFACE ANTIGEN: Hepatitis B Surface Ag: NONREACTIVE

## 2017-07-23 LAB — C. TRACHOMATIS/N. GONORRHOEAE RNA
C. trachomatis RNA, TMA: NOT DETECTED
N. GONORRHOEAE RNA, TMA: NOT DETECTED

## 2017-07-23 LAB — HIV ANTIBODY (ROUTINE TESTING W REFLEX): HIV: NONREACTIVE

## 2017-07-23 LAB — RPR: RPR Ser Ql: NONREACTIVE

## 2017-07-23 NOTE — Telephone Encounter (Signed)
Patient called inquiring about results of STD testing yesterday and if they might be ready. I called her back and got voice mail and told her that all but one test is back and all is negative.  I asked her to call me for further details.

## 2017-07-24 ENCOUNTER — Encounter (INDEPENDENT_AMBULATORY_CARE_PROVIDER_SITE_OTHER): Payer: Self-pay

## 2017-07-26 ENCOUNTER — Other Ambulatory Visit: Payer: Self-pay | Admitting: Otolaryngology

## 2017-07-26 DIAGNOSIS — R438 Other disturbances of smell and taste: Principal | ICD-10-CM

## 2017-07-26 DIAGNOSIS — R431 Parosmia: Secondary | ICD-10-CM

## 2017-08-09 ENCOUNTER — Ambulatory Visit

## 2017-08-14 ENCOUNTER — Other Ambulatory Visit: Payer: Self-pay | Admitting: Family Medicine

## 2017-08-14 ENCOUNTER — Ambulatory Visit
Admission: RE | Admit: 2017-08-14 | Discharge: 2017-08-14 | Disposition: A | Discharge status: Home / Self Care | Source: Ambulatory Visit | Attending: Family Medicine | Admitting: Family Medicine

## 2017-08-14 DIAGNOSIS — M542 Cervicalgia: Secondary | ICD-10-CM | POA: Diagnosis present

## 2017-08-19 ENCOUNTER — Telehealth: Payer: Self-pay | Admitting: Psychiatry

## 2017-08-19 MED ORDER — LAMOTRIGINE 100 MG PO TABS: 100.0000 mg | ORAL_TABLET | Freq: Every day | ORAL | 1 refills | Status: DC

## 2017-08-19 NOTE — Telephone Encounter (Signed)
Sent Lamictal to pharmacy 

## 2017-08-21 ENCOUNTER — Ambulatory Visit: Admitting: Psychiatry

## 2017-08-28 ENCOUNTER — Other Ambulatory Visit: Payer: Self-pay | Admitting: Otolaryngology

## 2017-08-28 DIAGNOSIS — R431 Parosmia: Secondary | ICD-10-CM

## 2017-08-28 DIAGNOSIS — R438 Other disturbances of smell and taste: Principal | ICD-10-CM

## 2017-09-07 ENCOUNTER — Encounter: Payer: Self-pay | Admitting: Intensive Care

## 2017-09-07 ENCOUNTER — Emergency Department
Admission: EM | Admit: 2017-09-07 | Discharge: 2017-09-07 | Disposition: A | Discharge status: Home / Self Care | Attending: Emergency Medicine | Admitting: Emergency Medicine

## 2017-09-07 ENCOUNTER — Other Ambulatory Visit: Payer: Self-pay

## 2017-09-07 ENCOUNTER — Emergency Department

## 2017-09-07 DIAGNOSIS — N200 Calculus of kidney: Secondary | ICD-10-CM | POA: Diagnosis not present

## 2017-09-07 DIAGNOSIS — I10 Essential (primary) hypertension: Secondary | ICD-10-CM | POA: Insufficient documentation

## 2017-09-07 DIAGNOSIS — Z79899 Other long term (current) drug therapy: Secondary | ICD-10-CM | POA: Insufficient documentation

## 2017-09-07 DIAGNOSIS — R1031 Right lower quadrant pain: Secondary | ICD-10-CM | POA: Diagnosis present

## 2017-09-07 LAB — BASIC METABOLIC PANEL
Anion gap: 4 — ABNORMAL LOW (ref 5–15)
BUN: 12 mg/dL (ref 6–20)
CALCIUM: 9.2 mg/dL (ref 8.9–10.3)
CO2: 26 mmol/L (ref 22–32)
CREATININE: 0.73 mg/dL (ref 0.44–1.00)
Chloride: 110 mmol/L (ref 98–111)
GFR calc Af Amer: >60 mL/min (ref >60)
Glucose, Bld: 95 mg/dL (ref 70–99)
Potassium: 4.4 mmol/L (ref 3.5–5.1)
SODIUM: 140 mmol/L (ref 135–145)

## 2017-09-07 LAB — CBC
HCT: 37.4 % (ref 35.0–47.0)
Hemoglobin: 12.7 g/dL (ref 12.0–16.0)
MCH: 28.9 pg (ref 26.0–34.0)
MCHC: 34 g/dL (ref 32.0–36.0)
MCV: 85 fL (ref 80.0–100.0)
PLATELETS: 268 10*3/uL (ref 150–440)
RBC: 4.4 MIL/uL (ref 3.80–5.20)
RDW: 16.2 % — ABNORMAL HIGH (ref 11.5–14.5)
WBC: 4.5 10*3/uL (ref 3.6–11.0)

## 2017-09-07 LAB — URINALYSIS, COMPLETE (UACMP) WITH MICROSCOPIC
BACTERIA UA: NONE SEEN
SQUAMOUS EPITHELIAL / LPF: NONE SEEN (ref 0–5)
Specific Gravity, Urine: 1.017 (ref 1.005–1.030)

## 2017-09-07 LAB — PREGNANCY, URINE: Preg Test, Ur: NEGATIVE

## 2017-09-07 MED ORDER — TAMSULOSIN HCL 0.4 MG PO CAPS
0.4000 mg | ORAL_CAPSULE | Freq: Once | ORAL | Status: AC
  Administered 2017-09-07: 0.4 mg via ORAL
  Filled 2017-09-07: qty 1

## 2017-09-07 MED ORDER — OXYCODONE-ACETAMINOPHEN 5-325 MG PO TABS
1.0000 | ORAL_TABLET | ORAL | Status: DC | PRN
  Administered 2017-09-07: 1 via ORAL
  Filled 2017-09-07: qty 1

## 2017-09-07 MED ORDER — PREDNISONE 50 MG PO TABS: 50.0000 mg | ORAL_TABLET | Freq: Every day | ORAL | 0 refills | Status: DC

## 2017-09-07 MED ORDER — OXYCODONE-ACETAMINOPHEN 5-325 MG PO TABS: 1.0000 | ORAL_TABLET | Freq: Four times a day (QID) | ORAL | 0 refills | Status: DC | PRN

## 2017-09-07 MED ORDER — OXYCODONE-ACETAMINOPHEN 5-325 MG PO TABS
1.0000 | ORAL_TABLET | Freq: Once | ORAL | Status: AC
  Administered 2017-09-07: 1 via ORAL
  Filled 2017-09-07: qty 1

## 2017-09-07 MED ORDER — KETOROLAC TROMETHAMINE 30 MG/ML IJ SOLN
30.0000 mg | Freq: Once | INTRAMUSCULAR | Status: AC
  Administered 2017-09-07: 30 mg via INTRAMUSCULAR
  Filled 2017-09-07: qty 1

## 2017-09-07 MED ORDER — TAMSULOSIN HCL 0.4 MG PO CAPS: 0.4000 mg | ORAL_CAPSULE | Freq: Every day | ORAL | 1 refills | Status: DC

## 2017-09-07 NOTE — ED Notes (Signed)

## 2017-09-07 NOTE — ED Provider Notes (Signed)
[facility] Emergency Department Provider Note  ____________________________________________  Time seen: Approximately 3:36 PM  I have reviewed the triage vital signs and the nursing notes.   HISTORY  Chief Complaint Flank Pain (right)    HPI [name] Garms is a 37 y.o. female who presents the emergency department complaining of right flank pain, hematuria.  Patient reports that she has a significant history of kidney stones, the first stone being at age 37.  Patient has had multiple lithotripsies, stents placed over the years for her stones.  Patient reports that last kidney stone was approximately a year and a half ago, that was 11 mm, causing complete ureteral obstruction, hydronephrosis as well as being infected.  Required urgent stent placement given infected kidney stone status.  Stent was later removed.  Patient denies any recurrent kidney stones since then.  She reports that she has had increasing flank pain, hematuria.  She also reports that she is on her.  And some of the hematuria may be coming from same.  Patient denies any abdominal pain, nausea or vomiting, she denies any fevers or chills, dysuria, polyuria.  Patient reports that pain is similar to previous kidney stones.    Past Medical History:  Diagnosis Date  . Abdominal pain, other specified site   . 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 Anxiety state, unspecified   . Bipolar 1 disorder, depressed (HCC) 05/21/2017  . BIPOLAR AFFECTIVE DISORDER 12/23/2006   Qualifier: Diagnosis of  By: Ermalene SearingBedsole MD, Amy    . Bipolar affective disorder (HCC) 10/12/2011  . Bipolar disorder, unspecified (HCC)   . Depressive disorder, not elsewhere classified   . Edema   . Esophageal reflux   . Fibromyalgia   . Headache   . History of kidney stones   . Mild or unspecified pre-eclampsia, unspecified as to episode of care   . Morbid obesity (HCC)   . Myalgia and myositis, unspecified   . Other dyspnea and  respiratory abnormality   . Preeclampsia    with first pregnancy  . Raynaud's disease   . Urinary tract infection, site not specified     Patient Active Problem List   Diagnosis Date Noted  . Major depressive disorder, 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/22/2017  . GAD (generalized anxiety disorder) 05/22/2017  . Acetaminophen overdose 05/21/2017  . Left ureteral calculus 04/25/2016  . Tingling in extremities 08/29/2015  . Acute nonintractable headache 08/29/2015  . Visual disturbance 08/23/2015  . Polydipsia 03/23/2014  . Binge eating disorder 03/23/2014  . Vaginitis and vulvovaginitis 12/31/2013  . Paronychia 09/21/2013  . Essential hypertension, benign 06/05/2013  . Bilateral leg pain 05/22/2013  . Right carpal tunnel syndrome 09/16/2012  . General counseling and advice on female contraception 01/22/2012  . Anhydramnios 01/02/2012  . Umbilical cord complication 12/07/2011  . High-risk pregnancy 11/09/2011  . H/O cesarean section 10/12/2011  . Calculus of kidney 10/12/2011  . Obesity affecting pregnancy, antepartum 10/12/2011  . History of cesarean section 10/12/2011  . SNORING 07/23/2007  . EDEMA 06/11/2007  . Morbid obesity (HCC) 12/23/2006  . GERD 12/23/2006  . MILD/UNSPEC PRE-ECLAMPSIA UNSPEC AS EPISODE CARE 12/23/2006  . Fibromyalgia 12/23/2006    Past Surgical History:  Procedure Laterality Date  . CESAREAN SECTION     X 2  . CHOLECYSTECTOMY  03/2006  . CYSTOSCOPY W/ URETERAL STENT PLACEMENT Left 05/14/2016   Procedure: CYSTOSCOPY WITH STENT REPLACEMENT;  Surgeon: Vanna ScotlandAshley Brandon, MD;  Location: [facility] ORS;  Service: Urology;  Laterality:  Left;  . CYSTOSCOPY WITH STENT PLACEMENT Left 04/25/2016   Procedure: CYSTOSCOPY WITH STENT PLACEMENT;  Surgeon: [name], MD;  Location: [facility] OR[facility];  Service: Urology;  Laterality: Left;  . DILATION AND CURETTAGE OF UTERUS    . IVC FILTER INSERTION  03/26/2016   [facility]  . LAPAROSCOPIC GASTRIC RESTRICTIVE  DUODENAL PROCEDURE (DUODENAL SWITCH)  03/2016  . LITHOTRIPSY    . TONSILLECTOMY AND ADENOIDECTOMY    . URETEROSCOPY WITH HOLMIUM LASER LITHOTRIPSY Left 05/14/2016   Procedure: URETEROSCOPY WITH HOLMIUM LASER LITHOTRIPSY;  Surgeon: [name], MD;  Location: [facility] ORS;  Service: Urology;  Laterality: Left;    Prior to Admission medications   Medication Sig Start Date End Date Taking? Authorizing Provider  ARIPiprazole (ABILIFY) 15 MG tablet Take 1 tablet (15 mg total) by mouth daily. 06/20/17   Jomarie Longs, MD  buPROPion (WELLBUTRIN XL) 150 MG 24 hr tablet Take 1 tablet (150 mg total) by mouth daily. 06/20/17   Jomarie Longs, MD  calcium citrate-vitamin D 500-400 MG-UNIT chewable tablet Chew 1 tablet by mouth daily.     [provider]  escitalopram (LEXAPRO) 20 MG tablet Take 1 tablet (20 mg total) by mouth daily. 06/20/17   Jomarie Longs, MD  hydrOXYzine (ATARAX/VISTARIL) 25 MG tablet Take 1 tablet (25 mg total) by mouth daily as needed for anxiety. 06/20/17   Jomarie Longs, MD  Lactobacillus Rhamnosus, GG, (CULTURELLE) CAPS Take by mouth.    [provider]  lamoTRIgine (LAMICTAL) 100 MG tablet Take 1 tablet (100 mg total) by mouth daily. 08/19/17   Jomarie Longs, MD  montelukast (SINGULAIR) 10 MG tablet Take 10 mg by mouth daily.    [provider]  Multiple Vitamin (MULTIVITAMIN) tablet Take 1 tablet by mouth daily.    [provider]  NIFEdipine (PROCARDIA-XL/ADALAT CC) 60 MG 24 hr tablet Take 60 mg by mouth daily.     [provider]  omeprazole (PRILOSEC) 20 MG capsule Take 20 mg by mouth daily.    [provider]  oxyCODONE-acetaminophen (PERCOCET/ROXICET) 5-325 MG tablet Take 1 tablet by mouth every 6 (six) hours as needed for severe pain. 09/07/17   Isaiah Torok, Delorise Royals, PA-C  PARAGARD INTRAUTERINE COPPER IUD IUD by Intrauterine route.    [provider]  predniSONE (DELTASONE) 50 MG tablet Take 1 tablet (50 mg total)  by mouth daily with breakfast. 09/07/17   Yonas Bunda, Delorise Royals, PA-C  Specialty Vitamins Products (BIOTIN PLUS KERATIN PO) Take 1 tablet by mouth daily.    [provider]  tamsulosin (FLOMAX) 0.4 MG CAPS capsule Take 1 capsule (0.4 mg total) by mouth daily. 09/07/17   Ardelle Haliburton, Delorise Royals, PA-C  traZODone (DESYREL) 100 MG tablet Take 1 tablet (100 mg total) by mouth at bedtime as needed for sleep. 06/20/17   Jomarie Longs, MD    Allergies Molds & smuts; Pollen extract; Uncaria tomentosa (cats claw); Milk-related compounds; Pineapple; Soy allergy; and Strawberry extract  Family History  Problem Relation Age of Onset  . Depression Father   . Alcohol abuse Father   . Depression Mother   . Hyperlipidemia Mother   . Sleep apnea Mother   . Depression Sister   . Hyperlipidemia Brother   . Depression Brother   . Hyperlipidemia Brother   . Depression Brother   . Alcohol abuse Brother   . Coronary artery disease Maternal Grandmother   . Heart attack Maternal Grandmother   . Diabetes Maternal Grandmother   . Lung cancer Maternal Grandfather   .  Emphysema Maternal Grandfather   . Coronary artery disease Maternal Grandfather   . Lupus Unknown        Aunt  . Fibromyalgia Unknown        Aunt    Social History Social History   Tobacco Use  . Smoking status: Never Smoker  . Smokeless tobacco: Never Used  Substance Use Topics  . Alcohol use: Yes    Alcohol/week: 2.0 - 3.0 standard drinks    Types: 2 Glasses of wine per week  . Drug use: No     Review of Systems  Constitutional: No fever/chills Eyes: No visual changes.  Cardiovascular: no chest pain. Respiratory: no cough. No SOB. Gastrointestinal: No abdominal pain.  No nausea, no vomiting.  No diarrhea.  No constipation. Genitourinary: Negative for dysuria.  Positive for right flank pain, hematuria. Musculoskeletal: Negative for musculoskeletal pain. Skin: Negative for rash, abrasions, lacerations,  ecchymosis. Neurological: Negative for headaches, focal weakness or numbness. 10-point ROS otherwise negative.  ____________________________________________   PHYSICAL EXAM:  VITAL SIGNS: ED Triage Vitals  Enc Vitals Group     BP 09/07/17 1324 121/71     Pulse Rate 09/07/17 1324 63     Resp 09/07/17 1324 16     Temp 09/07/17 1324 98.9 F (37.2 C)     Temp Source 09/07/17 1324 Oral     SpO2 09/07/17 1324 100 %     Weight 09/07/17 1325 295 lb (133.8 kg)     Height 09/07/17 1325 5\' 4"  (1.626 m)     Head Circumference --      Peak Flow --      Pain Score 09/07/17 1325 7     Pain Loc --      Pain Edu? --      Excl. in GC? --      Constitutional: Alert and oriented. Well appearing and in no acute distress. Eyes: Conjunctivae are normal. PERRL. EOMI. Head: Atraumatic. Neck: No stridor.    Cardiovascular: Normal rate, regular rhythm. Normal S1 and S2.  Good peripheral circulation. Respiratory: Normal respiratory effort without tachypnea or retractions. Lungs CTAB. Good air entry to the bases with no decreased or absent breath sounds. Gastrointestinal: Bowel sounds 4 quadrants. Soft and nontender to palpation all quadrants. No guarding or rigidity to palpation. No palpable masses. No distention. No CVA tenderness. Musculoskeletal: Full range of motion to all extremities. No gross deformities appreciated. Neurologic:  Normal speech and language. No gross focal neurologic deficits are appreciated.  Skin:  Skin is warm, dry and intact. No rash noted. Psychiatric: Mood and affect are normal. Speech and behavior are normal. Patient exhibits appropriate insight and judgement.   ____________________________________________   LABS (all labs ordered are listed, but only abnormal results are displayed)  Labs Reviewed  URINALYSIS, COMPLETE (UACMP) WITH MICROSCOPIC - Abnormal; Notable for the following components:      Result Value   Color, Urine RED (*)    APPearance CLOUDY (*)     Glucose, UA   (*)    Value: TEST NOT REPORTED DUE TO COLOR INTERFERENCE OF URINE PIGMENT   Hgb urine dipstick   (*)    Value: TEST NOT REPORTED DUE TO COLOR INTERFERENCE OF URINE PIGMENT   Bilirubin Urine   (*)    Value: TEST NOT REPORTED DUE TO COLOR INTERFERENCE OF URINE PIGMENT   Ketones, ur   (*)    Value: TEST NOT REPORTED DUE TO COLOR INTERFERENCE OF URINE PIGMENT   Protein, ur   (*)  Value: TEST NOT REPORTED DUE TO COLOR INTERFERENCE OF URINE PIGMENT   Nitrite   (*)    Value: TEST NOT REPORTED DUE TO COLOR INTERFERENCE OF URINE PIGMENT   Leukocytes, UA   (*)    Value: TEST NOT REPORTED DUE TO COLOR INTERFERENCE OF URINE PIGMENT   RBC / HPF >50 (*)    All other components within normal limits  BASIC METABOLIC PANEL - Abnormal; Notable for the following components:   Anion gap 4 (*)    All other components within normal limits  CBC - Abnormal; Notable for the following components:   RDW 16.2 (*)    All other components within normal limits  PREGNANCY, URINE   ____________________________________________  EKG   ____________________________________________  RADIOLOGY I personally viewed and evaluated these images as part of my medical decision making, as well as reviewing the written report by the radiologist.  I concur with radiologist finding of nonobstructing right-sided renal calculi.  No indication of hydronephrosis.  No indication of nephrolithiasis and bilateral ureters.  Ct Renal Stone Study  Result Date: 09/07/2017 CLINICAL DATA:  Right flank pain x1 week, history of gastric bypass EXAM: CT ABDOMEN AND PELVIS WITHOUT CONTRAST TECHNIQUE: Multidetector CT imaging of the abdomen and pelvis was performed following the standard protocol without IV contrast. COMPARISON:  04/25/2016 FINDINGS: Lower chest: Lung bases are clear. Hepatobiliary: Unenhanced liver is unremarkable. Status post cholecystectomy. No intrahepatic or extrahepatic ductal dilatation. Pancreas: Within  normal limits. Spleen: Within normal limits. Adrenals/Urinary Tract: Adrenal glands are within normal limits. 5 mm nonobstructing right lower pole renal calculus (series 2/image 36). Left kidney is within normal limits. Lower ureteral or bladder calculi. No hydronephrosis. Bladder is within normal limits. Stomach/Bowel: Postsurgical changes related to gastric bypass/duodenal switch procedure. No evidence of bowel obstruction. Normal appendix (series 2/image 51). Vascular/Lymphatic: No evidence of abdominal aortic aneurysm. No suspicious abdominopelvic lymphadenopathy. Reproductive: Uterus is notable for an IUD in satisfactory position. Bilateral ovaries are within normal limits. Other: No abdominopelvic ascites. Small fat containing periumbilical hernia. Musculoskeletal: Mild degenerative changes of the visualized thoracolumbar spine. IMPRESSION: 5 mm nonobstructing right lower pole renal calculus. No ureteral or bladder calculi. No hydronephrosis. Postsurgical changes, as above. Electronically Signed   By: Charline Bills M.D.   On: 09/07/2017 16:52    ____________________________________________    PROCEDURES  Procedure(s) performed:    Procedures    Medications  oxyCODONE-acetaminophen (PERCOCET/ROXICET) 5-325 MG per tablet 1 tablet (1 tablet Oral Given 09/07/17 1330)  ketorolac (TORADOL) 30 MG/ML injection 30 mg (has no administration in time range)  oxyCODONE-acetaminophen (PERCOCET/ROXICET) 5-325 MG per tablet 1 tablet (has no administration in time range)  tamsulosin (FLOMAX) capsule 0.4 mg (has no administration in time range)     ____________________________________________   INITIAL IMPRESSION / ASSESSMENT AND PLAN / ED COURSE  Pertinent labs & imaging results that were available during my care of the patient were reviewed by me and considered in my medical decision making (see chart for details).  Review of the [place] CSRS was performed in accordance of the [facility] prior to  dispensing any controlled drugs.  Clinical Course as of Sep 07 1713  Sat Sep 07, 2017  1611 Patient presented to the emergency department with right flank pain, hematuria.  Urinalysis returns with significant hematuria.  Given patient's history, CT scan will be ordered to evaluate nephrolithiasis.  Exam is overall reassuring at this time.   [JC]    Clinical Course User Index [JC] Meilani Edmundson, Delorise Royals, PA-C  Patient's diagnosis is consistent with nephrolithiasis.  Patient presents emergency department with right flank pain, hematuria.  Patient has a significant history of nephrolithiasis.  Given patient's symptoms, history, CT scan was ordered.  This returns with nonobstructing right-sided renal calculi.  No evidence of obstruction warranting urgent urology consult.  Patient will be discharged with pain medication, oral oral steroid for anti-inflammatory control as patient has had a gastric bypass, Flomax.  Patient is given signs and symptoms to return to the emergency department they present.  However, the stone is 5 mm and should pass without complication.  Patient has a urologist and is also instructed to follow-up if pain does not seem to improve after 2 to 3 days.  Otherwise, patient will follow primary care as needed patient is given ED precautions to return to the ED for any worsening or new symptoms.     ____________________________________________  FINAL CLINICAL IMPRESSION(S) / ED DIAGNOSES  Final diagnoses:  Nephrolithiasis      NEW MEDICATIONS STARTED DURING THIS VISIT:  ED Discharge Orders         Ordered    tamsulosin (FLOMAX) 0.4 MG CAPS capsule  Daily     09/07/17 1712    oxyCODONE-acetaminophen (PERCOCET/ROXICET) 5-325 MG tablet  Every 6 hours PRN     09/07/17 1712    predniSONE (DELTASONE) 50 MG tablet  Daily with breakfast     09/07/17 1712              This chart was dictated using voice recognition software/Dragon. Despite best efforts to  proofread, errors can occur which can change the meaning. Any change was purely unintentional.    Racheal Patches, PA-C 09/07/17 1715    Jene Every, MD 09/07/17 [phone]

## 2017-09-07 NOTE — ED Triage Notes (Signed)
Patient c/o R flank pain Xfew days ago. Pain has gradually gotten worse. No OTC meds helping with pain. HX kidney stones

## 2017-09-10 ENCOUNTER — Encounter: Payer: Self-pay | Admitting: Radiology

## 2017-09-10 ENCOUNTER — Ambulatory Visit
Admission: RE | Admit: 2017-09-10 | Discharge: 2017-09-10 | Disposition: A | Discharge status: Home / Self Care | Source: Ambulatory Visit | Attending: Otolaryngology | Admitting: Otolaryngology

## 2017-09-10 DIAGNOSIS — R438 Other disturbances of smell and taste: Secondary | ICD-10-CM | POA: Diagnosis not present

## 2017-09-10 DIAGNOSIS — R431 Parosmia: Secondary | ICD-10-CM

## 2017-09-10 MED ORDER — GADOBENATE DIMEGLUMINE 529 MG/ML IV SOLN
20.0000 mL | Freq: Once | INTRAVENOUS | Status: AC | PRN
  Administered 2017-09-10: 20 mL via INTRAVENOUS

## 2017-11-01 ENCOUNTER — Ambulatory Visit: Admitting: Obstetrics & Gynecology

## 2017-11-12 ENCOUNTER — Ambulatory Visit (INDEPENDENT_AMBULATORY_CARE_PROVIDER_SITE_OTHER): Admitting: Psychiatry

## 2017-11-12 ENCOUNTER — Telehealth (HOSPITAL_COMMUNITY): Payer: Self-pay | Admitting: Psychiatry

## 2017-11-12 ENCOUNTER — Other Ambulatory Visit: Payer: Self-pay

## 2017-11-12 ENCOUNTER — Encounter: Payer: Self-pay | Admitting: Psychiatry

## 2017-11-12 VITALS — BP 126/75 | HR 81 | Temp 98.6°F | Wt 293.6 lb

## 2017-11-12 DIAGNOSIS — F411 Generalized anxiety disorder: Secondary | ICD-10-CM | POA: Diagnosis not present

## 2017-11-12 DIAGNOSIS — F5081 Binge eating disorder: Secondary | ICD-10-CM | POA: Diagnosis not present

## 2017-11-12 DIAGNOSIS — F3160 Bipolar disorder, current episode mixed, unspecified: Secondary | ICD-10-CM

## 2017-11-12 MED ORDER — LITHIUM CARBONATE ER 300 MG PO TBCR: 300.0000 mg | EXTENDED_RELEASE_TABLET | Freq: Two times a day (BID) | ORAL | 0 refills | Status: DC

## 2017-11-12 MED ORDER — ARIPIPRAZOLE 30 MG PO TABS: 30.0000 mg | ORAL_TABLET | Freq: Every day | ORAL | 0 refills | Status: DC

## 2017-11-12 NOTE — Patient Instructions (Signed)
Lithium extended-release tablets  What is this medicine?  LITHIUM (LITH ee um) is used to prevent and treat the manic episodes caused by manic-depressive illness.  This medicine may be used for other purposes; ask your health care provider or pharmacist if you have questions.  COMMON BRAND NAME(S): Eskalith CR, Lithobid  What should I tell my health care provider before I take this medicine?  They need to know if you have any of these conditions:  -active infection  -Brugada Syndrome  -dehydration (diarrhea or sweating)  -diet low in salt  -heart disease  -high levels of calcium in the blood  -history of irregular heartbeat  -kidney disease  -low level of potassium or sodium in the blood  -parathyroid disease  -problems urinating  -thyroid disease  -an unusual or allergic reaction to lithium, other medicines, foods, dyes, or preservatives  -pregnant or trying to get pregnant  -breast-feeding  How should I use this medicine?  Take this medicine by mouth with a glass of water. Follow the directions on the prescription label. Swallow the tablets whole. Do not break, crush or chew. Take after a meal or snack to avoid stomach upset. Take your doses at regular intervals. Do not take your medicine more often than directed. The amount of this medicine you take is very important. Taking more than the prescribed dose can cause serious side effects. Do not stop taking except on your the advice of your doctor or health care professional.  Talk to your pediatrician regarding the use of this medicine in children. Special care may be needed. While this drug may be prescribed for children as young as 12 years for selected conditions, precautions do apply.  Overdosage: If you think you have taken too much of this medicine contact a poison control center or emergency room at once.  NOTE: This medicine is only for you. Do not share this medicine with others.  What if I miss a dose?  If you miss a dose, take it as soon as you can. If  it is almost time for your next dose, take only that dose. Do not take double or extra doses.  What may interact with this medicine?  Do not take this medicine with any of the following medications:  -cisapride  -dofetilide  -dronedarone  -pimozide  -stimulant medicines used to treat ADHD or narcolepsy  -thioridazine  -ziprasidone  This medicine may also interact with the following medications:  -buspirone  -caffeine  -calcium iodide  -carbamazepine  -certain medicines for depression, anxiety, or psychotic disturbances  -certain medicines for migraine headache like almotriptan, eletriptan, frovatriptan, naratriptan, rizatriptan, sumatriptan, zolmitriptan  -diuretics  -fentanyl  -linezolid  -MAOIs like Carbex, Eldepryl, Marplan, Nardil, and Parnate  -medicines for high blood pressure  -medicines that relax muscles for surgery  -metronidazole  -NSAIDs, medicines for pain and inflammation, like ibuprofen or naproxen  -other medicines that prolong the QT interval (cause an abnormal heart rhythm)  -phenytoin  -potassium iodide, KI  -sodium bicarbonate  -sodium chloride  -St. John's Wort  -tramadol  -tryptophan  -urea  This list may not describe all possible interactions. Give your health care provider a list of all the medicines, herbs, non-prescription drugs, or dietary supplements you use. Also tell them if you smoke, drink alcohol, or use illegal drugs. Some items may interact with your medicine.  What should I watch for while using this medicine?  Visit your doctor or health care professional for regular checks on your progress. It can   take several weeks of treatment before you start to get better.  The amount of salt (sodium) in your body influences the effects of this medicine, and this medicine can increase salt loss from the body. Eat a normal diet that includes salt. Do not change to salt substitutes. Avoid changes involving diet, or medications that include large amounts of sodium like sodium bicarbonate. Ask  your doctor or health care professional for advice if you are not sure.  Drink plenty of fluids while you are taking this medicine. Avoid drinks that contain caffeine, such as coffee, tea and colas. You will need extra fluids if you have diarrhea or sweat a lot. This will help prevent toxic effects from this medicine. Be careful not to get overheated during exercise, saunas, hot baths, and hot weather. Consult your doctor or health care professional if you have a high fever or persistent diarrhea.  You may get drowsy or dizzy. Do not drive, use machinery, or do anything that needs mental alertness until you know how this medicine affects you. Do not stand or sit up quickly, especially if you are an older patient. This reduces the risk of dizzy or fainting spells.  What side effects may I notice from receiving this medicine?  Side effects that you should report to your doctor or health care professional as soon as possible:  -allergic reactions like skin rash, itching or hives, swelling of the face, lips, or tongue  -blurred vision  -breathing problems  -clumsiness or loss of balance  -confusion  -difficulty speaking or swallowing  -dizziness  -feeling faint or lightheaded, falls  -increased thirst  -increased urination  -loss of appetite  -muscle weakness  -nausea, vomiting  -pain, coldness, or blue coloration of fingers or toes  -sensitivity to cold  -seizures  -slow, fast, or irregular heartbeat (palpitations)  -slurred speech  -swelling in the neck  -unusually weak or tired  Side effects that usually do not require medical attention (report to your doctor or health care professional if they continue or are bothersome):  -acne  -diarrhea  -mild tremor  -stomach pain  -weight gain  This list may not describe all possible side effects. Call your doctor for medical advice about side effects. You may report side effects to FDA at 1-800-FDA-1088.  Where should I keep my medicine?  Keep out of the reach of  children.  Store at room temperature between 15 and 30 degrees C (59 and 86 degrees F). Keep container tightly closed. Protect from light. Throw away any unused medicine after the expiration date.  NOTE: This sheet is a summary. It may not cover all possible information. If you have questions about this medicine, talk to your doctor, pharmacist, or health care provider.   2018 Elsevier/Gold Standard (2014-12-29 17:25:37)

## 2017-11-12 NOTE — Telephone Encounter (Signed)
D:  Dr. Elna Breslow referred pt to [facility].  Placed call to orient pt and answer her questions.  Unfortunately, [facility] doesn't accept [facility] therefore pt was referred to [facility] [phone]).  A:  Discussed the services with pt.  Provided pt with phone number.  R:  Pt receptive with referral.

## 2017-11-12 NOTE — Progress Notes (Signed)
BH MD OP Progress Note  11/12/2017 3:02 PM Sierra [name]  MRN:  [id number]  Chief Complaint: ' I am here for follow up." Chief Complaint    Follow-up     HPI: Sierra [name] is a 37 year old Caucasian female, married, lives in [place], has a history of bipolar disorder, binge eating disorder, anxiety disorder, presented to the clinic today for a follow-up visit.  Patient used to follow up with Dr. Garnetta Buddy in the past.  This is her second visit with Clinical research associate.  Patient was last seen on 06/20/2017 and was advised to return to clinic in a few weeks.  Patient however did not follow recommendations.  Patient today reports she was busy doing other things.  She reports she and her husband are emotionally separated.  They currently stay in different bedrooms in the same house.  She however reports her husband continues to be supportive.  She has started seeing a therapist at [facility][name].  She reports she sees her every week and therapy visits as going well.  She reports she also was going through the stressor of her 64 year old son struggling with some mental health problems.  She reports she does not think the medications is helpful anymore.  She would like to make some changes with her medications today.  She describes her symptoms as having mood lability every single day.  She reports her mood symptoms are mostly depressed.  She went through a phase early October when she could barely get out of bed.  She reports she would get out and do the bare minimum to get her children what they needed.  She would get back in bed after she had done what she needed to do for her children.  She also reports she overdosed on some of her medications 1st  October and decided not to tell anyone.  She reports she took a few pills of trazodone along with her oxycodone.  She reports she hence currently does not take the trazodone anymore and kept it away.  She currently denies any suicidality.  She however  reports she wants medication changes to get help.  Discussed lithium with patient.  She reports she does not remember what happened when she tried it in the past.  She remembers being on it in the past.  She does not remember having side effects to it.  She however reports she is motivated to start it and see how it goes.  Also discussed referral for IOP.  Will make the referral to [name].  Patient advised to return to clinic in 1 week from now.  Patient agrees to talk to her daughter-in-law or her husband if she has suicidal thoughts again.  She agrees to go back to inpatient mental health program if required. Visit Diagnosis:    ICD-10-CM   1. Bipolar I disorder, most recent episode mixed (HCC) F31.60 ARIPiprazole (ABILIFY) 30 MG tablet    lithium carbonate (LITHOBID) 300 MG CR tablet  2. GAD (generalized anxiety disorder) F41.1   3. Binge eating disorder F50.81     Past Psychiatric History: Reviewed past psychiatric history from my progress note on 06/20/2017.  Past Medical History:  Past Medical History:  Diagnosis Date  . Abdominal pain, other specified site   . 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 Anxiety state, unspecified   . Bipolar 1 disorder, depressed (HCC) 05/21/2017  . BIPOLAR AFFECTIVE DISORDER 12/23/2006   Qualifier: Diagnosis of  By: Ermalene Searing MD,  Amy    . Bipolar affective disorder (HCC) 10/12/2011  . Bipolar disorder, unspecified (HCC)   . Depressive disorder, not elsewhere classified   . Edema   . Esophageal reflux   . Fibromyalgia   . Headache   . History of kidney stones   . Mild or unspecified pre-eclampsia, unspecified as to episode of care   . Morbid obesity (HCC)   . Myalgia and myositis, unspecified   . Other dyspnea and respiratory abnormality   . Preeclampsia    with first pregnancy  . Raynaud's disease   . Urinary tract infection, site not specified     Past Surgical History:  Procedure Laterality Date  . CESAREAN  SECTION     X 2  . CHOLECYSTECTOMY  03/2006  . CYSTOSCOPY W/ URETERAL STENT PLACEMENT Left 05/14/2016   Procedure: CYSTOSCOPY WITH STENT REPLACEMENT;  Surgeon: [name], MD;  Location: [facility] ORS;  Service: Urology;  Laterality: Left;  . CYSTOSCOPY WITH STENT PLACEMENT Left 04/25/2016   Procedure: CYSTOSCOPY WITH STENT PLACEMENT;  Surgeon: [name], MD;  Location: [facility] OR[facility];  Service: Urology;  Laterality: Left;  . DILATION AND CURETTAGE OF UTERUS    . IVC FILTER INSERTION  03/26/2016   [facility]  . LAPAROSCOPIC GASTRIC RESTRICTIVE DUODENAL PROCEDURE (DUODENAL SWITCH)  03/2016  . LITHOTRIPSY    . TONSILLECTOMY AND ADENOIDECTOMY    . URETEROSCOPY WITH HOLMIUM LASER LITHOTRIPSY Left 05/14/2016   Procedure: URETEROSCOPY WITH HOLMIUM LASER LITHOTRIPSY;  Surgeon: [name], MD;  Location: [facility] OR[facility];  Service: Urology;  Laterality: Left;    Family Psychiatric History: Have reviewed family psychiatric history from my progress note on 06/20/2017  Family History:  Family History  Problem Relation Age of Onset  . Depression Father   . Alcohol abuse Father   . Depression Mother   . Hyperlipidemia Mother   . Sleep apnea Mother   . Depression Sister   . Hyperlipidemia Brother   . Depression Brother   . Hyperlipidemia Brother   . Depression Brother   . Alcohol abuse Brother   . Coronary artery disease Maternal Grandmother   . Heart attack Maternal Grandmother   . Diabetes Maternal Grandmother   . Lung cancer Maternal Grandfather   . Emphysema Maternal Grandfather   . Coronary artery disease Maternal Grandfather   . Lupus Unknown        Aunt  . Fibromyalgia Unknown        Aunt    Social History: Patient lives in the same house with her husband however reports they are emotionally separated.  I have reviewed social history from my progress note on 06/20/2017. Social History   Socioeconomic History  . Marital status: Married    Spouse name: brian  . Number of children: 2  .  Years of education: Not on file  . Highest education level: Some college, no degree  Occupational History  . Occupation: Arts development officer  Social Needs  . Financial resource strain: Not hard at all  . Food insecurity:    Worry: Never true    Inability: Never true  . Transportation needs:    Medical: No    Non-medical: No  Tobacco Use  . Smoking status: Never Smoker  . Smokeless tobacco: Never Used  Substance and Sexual Activity  . Alcohol use: Yes    Alcohol/week: 2.0 - 3.0 standard drinks    Types: 2 Glasses of wine per week  . Drug use: No  . Sexual activity: Yes  Partners: Male    Birth control/protection: None, IUD  Lifestyle  . Physical activity:    Days per week: 0 days    Minutes per session: 0 min  . Stress: To some extent  Relationships  . Social connections:    Talks on phone: More than three times a week    Gets together: Once a week    Attends religious service: Never    Active member of club or organization: No    Attends meetings of clubs or organizations: Never    Relationship status: Married  Other Topics Concern  . Not on file  Social History Narrative   1 child, 2 step-sons      Regular exercise-no   Diet: no fast food, likes sweets    Allergies:  Allergies  Allergen Reactions  . Molds & Smuts   . Pollen Extract   . Uncaria Tomentosa (Cats Claw)   . Milk-Related Compounds     Able to tolerate yogurt; mild intolerance to milk/cheese  . Pineapple   . Soy Allergy   . Strawberry Extract     Break out on tongue noted    Metabolic Disorder Labs: Lab Results  Component Value Date   HGBA1C 4.7 (L) 05/22/2017   MPG 88.19 05/22/2017   No results found for: PROLACTIN Lab Results  Component Value Date   CHOL 105 05/22/2017   TRIG 41 05/22/2017   HDL 48 05/22/2017   CHOLHDL 2.2 05/22/2017   VLDL 8 05/22/2017   LDLCALC 49 05/22/2017   LDLCALC 99 09/11/2013   Lab Results  Component Value Date   TSH 0.677 05/22/2017   TSH 3.71 10/01/2016     Therapeutic Level Labs: No results found for: LITHIUM No results found for: VALPROATE No components found for:  CBMZ  Current Medications: Current Outpatient Medications  Medication Sig Dispense Refill  . ARIPiprazole (ABILIFY) 30 MG tablet Take 1 tablet (30 mg total) by mouth daily. 30 tablet 0  . Biotin 5 MG TBDP Take by mouth.    Marland Kitchen buPROPion (WELLBUTRIN XL) 150 MG 24 hr tablet Take 1 tablet (150 mg total) by mouth daily. 90 tablet 1  . calcium citrate-vitamin D 500-400 MG-UNIT chewable tablet Chew 1 tablet by mouth daily.     Marland Kitchen escitalopram (LEXAPRO) 20 MG tablet Take 1 tablet (20 mg total) by mouth daily. 90 tablet 1  . hydrOXYzine (ATARAX/VISTARIL) 25 MG tablet Take 1 tablet (25 mg total) by mouth daily as needed for anxiety. 90 tablet 1  . Lactobacillus Rhamnosus, GG, (CULTURELLE) CAPS Take by mouth.    . lamoTRIgine (LAMICTAL) 100 MG tablet Take 1 tablet (100 mg total) by mouth daily. 90 tablet 1  . lithium carbonate (LITHOBID) 300 MG CR tablet Take 1 tablet (300 mg total) by mouth 2 (two) times daily with a meal. Mood symptoms 60 tablet 0  . montelukast (SINGULAIR) 10 MG tablet Take 10 mg by mouth daily.    . Multiple Vitamin (MULTIVITAMIN) tablet Take 1 tablet by mouth daily.    Marland Kitchen NIFEdipine (PROCARDIA-XL/ADALAT CC) 60 MG 24 hr tablet Take 60 mg by mouth daily.     Marland Kitchen omeprazole (PRILOSEC) 20 MG capsule Take 20 mg by mouth daily.    Marland Kitchen omeprazole (PRILOSEC) 20 MG capsule omeprazole 20 mg capsule,delayed release  TAKE 1 CAPSULE BY MOUTH ONCE DAILY    . oxyCODONE-acetaminophen (PERCOCET/ROXICET) 5-325 MG tablet Take 1 tablet by mouth every 6 (six) hours as needed for severe pain. 20 tablet 0  .  PARAGARD INTRAUTERINE COPPER IUD IUD by Intrauterine route.    . predniSONE (DELTASONE) 50 MG tablet Take 1 tablet (50 mg total) by mouth daily with breakfast. 5 tablet 0  . Specialty Vitamins Products (BIOTIN PLUS KERATIN PO) Take 1 tablet by mouth daily.    . tamsulosin (FLOMAX) 0.4 MG  CAPS capsule Take 1 capsule (0.4 mg total) by mouth daily. 14 capsule 1  . traMADol (ULTRAM) 50 MG tablet tramadol 50 mg tablet  Take 1 tablet every 6 hours by oral route.    . traZODone (DESYREL) 100 MG tablet Take 1 tablet (100 mg total) by mouth at bedtime as needed for sleep. 90 tablet 1   No current facility-administered medications for this visit.      Musculoskeletal: Strength & Muscle Tone: within normal limits Gait & Station: normal Patient leans: N/A  Psychiatric Specialty Exam: Review of Systems  Psychiatric/Behavioral: Positive for depression. The patient is nervous/anxious.   All other systems reviewed and are negative.   Blood pressure 126/75, pulse 81, temperature 98.6 F (37 C), temperature source Oral, weight 293 lb 9.6 oz (133.2 kg).Body mass index is 50.4 kg/m.  General Appearance: Casual  Eye Contact:  Fair  Speech:  Clear and Coherent  Volume:  Normal  Mood:  Anxious and Dysphoric  Affect:  Congruent  Thought Process:  Goal Directed and Descriptions of Associations: Intact  Orientation:  Full (Time, Place, and Person)  Thought Content: Logical   Suicidal Thoughts:  No  Homicidal Thoughts:  No  Memory:  Immediate;   Fair Recent;   Fair Remote;   Fair  Judgement:  Fair  Insight:  Fair  Psychomotor Activity:  Normal  Concentration:  Concentration: Fair and Attention Span: Fair  Recall:  [employer] of Knowledge: Fair  Language: Fair  Akathisia:  No  Handed:  Right  AIMS (if indicated): denies tremors, rigidity,stiffness  Assets:  Communication Skills Desire for Improvement Social Support  ADL's:  Intact  Cognition: WNL  Sleep:  Fair   Screenings: AIMS     Admission (Discharged) from 05/21/2017 in [facility] INPATIENT BE[facility]AV[facility]AL MEDICINE  AIMS Total Score  0    AUDIT     Admission (Discharged) from 05/21/2017 in [facility]INE  Alcohol Use Disorder Identification Test Final Score (AUDIT)  6       Assessment and Plan: Mitsuye  is a 37 year old Caucasian female who has a history of bipolar disorder, anxiety disorder, binge eating disorder, presented to the clinic today for a follow-up visit.  Patient with history of multiple inpatient mental health admissions in the past.  Patient recently struggling with mood lability-mostly depressive symptoms.  Patient has started psychotherapy sessions on a weekly basis which seems to help.  She is interested in making medication changes today.  She does have a past history of suicide attempts, family history of suicide attempt and recent suicidality.  She however currently denies it, seems to be future oriented, has small children she is responsible for, reports social support system from her husband, her therapist as well as her daughter-in-law,denies somorbid substance abuse .  Hence her acute risk is currently low and her chronic risk is high.  Patient agrees to go to intensive outpatient program.  Will make the referral today.  Will continue to make medication changes as noted below.  Plan Bipolar disorder Increase Abilify to 30 mg p.o. daily Continue Lamictal 100 mg p.o. daily. Add lithium 300 mg p.o. twice daily. Continue Wellbutrin XL 150 mg  p.o. daily. We will get lithium levels done-provided lab slip.  She will go to the lab in 5 days.  For anxiety disorder Continue Lexapro 20 mg p.o. daily. Continue hydroxyzine as needed.  For insomnia-she reports sleep is improved. She is no longer taking trazodone.  Will make the referral to intensive outpatient program-[facility].  She will continue psychotherapy sessions with her therapist.  Crisis plan discussed with patient.  She will return to clinic in 1 week from now.  More than 50 % of the time was spent for psychoeducation and supportive psychotherapy and care coordination.  This note was generated in part or whole with voice recognition software. Voice recognition is usually quite accurate but there are transcription errors  that can and very often do occur. I apologize for any typographical errors that were not detected and corrected.       Jomarie Longs, MD 11/13/2017, 8:55 AM

## 2017-11-13 ENCOUNTER — Encounter: Payer: Self-pay | Admitting: Psychiatry

## 2017-11-16 IMAGING — RF DG FLUORO GUIDE LUMBAR PUNCTURE
1 series · 1 of 1 positions shown · non-contrast
Comparison: none

CLINICAL DATA: Evaluate for pseudotumor, visual disturbance. Acute
headache.

[Series 1: cp_standard · 0.25mm/px · 1 of 1 slices shown]
[im 1/1]
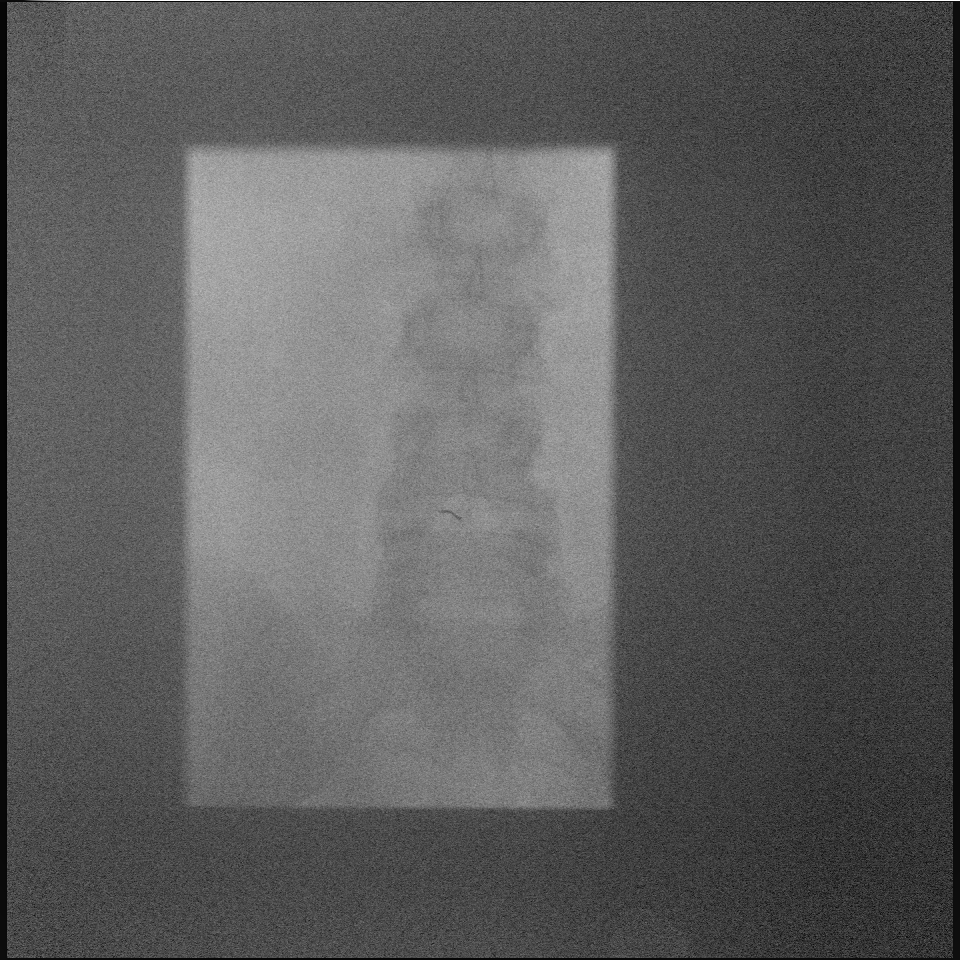

[1 of 1 positions shown; findings below may reference images not displayed]

EXAM:
DIAGNOSTIC LUMBAR PUNCTURE UNDER FLUOROSCOPIC GUIDANCE

FLUOROSCOPY TIME:  Fluoroscopy Time:  12 seconds

Radiation Exposure Index (if provided by the fluoroscopic device): 7
mGy

Number of Acquired Spot Images: 0

PROCEDURE:
Informed consent was obtained from the patient prior to the
procedure, including potential complications of headache, allergy,
and pain. With the patient prone, the lower back was prepped with
Betadine. 1% Lidocaine was used for local anesthesia. Lumbar
puncture was performed at the L4-5 level using a 22 gauge needle
with return of clear CSF with an opening pressure of 14 cm water. 9
ml of CSF were obtained for laboratory studies. Closing pressure
measures 10 cm water. The patient tolerated the procedure well and
there were no apparent complications.
IMPRESSION: Successful fluoroscopic guided lumbar puncture.

## 2017-11-18 ENCOUNTER — Other Ambulatory Visit: Payer: Self-pay | Admitting: Psychiatry

## 2017-11-19 LAB — LITHIUM LEVEL: LITHIUM LVL: 0.3 mmol/L — AB (ref 0.6–1.2)

## 2017-11-20 ENCOUNTER — Ambulatory Visit (INDEPENDENT_AMBULATORY_CARE_PROVIDER_SITE_OTHER): Admitting: Psychiatry

## 2017-11-20 ENCOUNTER — Encounter: Payer: Self-pay | Admitting: Psychiatry

## 2017-11-20 ENCOUNTER — Other Ambulatory Visit: Payer: Self-pay

## 2017-11-20 VITALS — BP 126/74 | HR 70 | Temp 97.8°F | Wt 293.4 lb

## 2017-11-20 DIAGNOSIS — G2111 Neuroleptic induced parkinsonism: Secondary | ICD-10-CM | POA: Diagnosis not present

## 2017-11-20 DIAGNOSIS — F411 Generalized anxiety disorder: Secondary | ICD-10-CM | POA: Diagnosis not present

## 2017-11-20 DIAGNOSIS — F3160 Bipolar disorder, current episode mixed, unspecified: Secondary | ICD-10-CM

## 2017-11-20 MED ORDER — BENZTROPINE MESYLATE 0.5 MG PO TABS: 0.5000 mg | ORAL_TABLET | Freq: Every day | ORAL | 1 refills | Status: DC

## 2017-11-20 MED ORDER — LITHIUM CARBONATE ER 300 MG PO TBCR: 300.0000 mg | EXTENDED_RELEASE_TABLET | Freq: Three times a day (TID) | ORAL | 0 refills | Status: DC

## 2017-11-20 NOTE — Progress Notes (Signed)
BH MD OP Progress Note  11/20/2017 2:14 PM Sierra [name]  MRN:  [id number]  Chief Complaint: ' I am here for follow up." Chief Complaint    Follow-up; Medication Refill     HPI: Sierra [name] is a 37 year old Caucasian female, married, lives in [place], has a history of bipolar disorder, binge eating disorder, anxiety disorder, presented to the clinic today for a follow-up visit.  Patient today reports her mood symptoms is improving. She reports the lithium initially made her tired however it is getting better.  She would like to take the dosage as a single one-time dose to see if that will help with some of the tiredness.  She also reports some tremors of her bilateral hands.  She reports she usually has these tremors when she increases the dosage of her medications.  She however reports this time it is persistent and is not improving.  Discussed adding Cogentin.  She agrees with plan. Discussed with her that her lithium as well as her Abilify could be contributing to her tremors.  We will continue to monitor closely.  Discussed with patient that her lithium levels came back as subtherapeutic.  Her lithium levels on 11/19/2017-0.3 mmol/L.  Discussed with patient that her dosage can be increased today if she agrees with it.   She reports increasing the Abilify helped a lot with her mood symptoms.  She also reports sleep is improved.  Patient reports situation at home as improved.  Her mother-in-law came to visit which may also have helped.  Patient continues to be in psychotherapy sessions which is going well.  She denies any suicidality or homicidality.  Patient denies any perceptual disturbances.  Visit Diagnosis:    ICD-10-CM   1. Bipolar I disorder, most recent episode mixed (HCC) F31.60 lithium carbonate (LITHOBID) 300 MG CR tablet  2. GAD (generalized anxiety disorder) F41.1   3. 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 Abdominal pain, other specified site   . 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 Anxiety state, unspecified   . Bipolar 1 disorder, depressed (HCC) 05/21/2017  . BIPOLAR AFFECTIVE DISORDER 12/23/2006   Qualifier: Diagnosis of  By: Ermalene Searing MD, Amy    . Bipolar affective disorder (HCC) 10/12/2011  . Bipolar disorder, unspecified (HCC)   . Depressive disorder, not elsewhere classified   . Edema   . Esophageal reflux   . Fibromyalgia   . Headache   . History of kidney stones   . Mild or unspecified pre-eclampsia, unspecified as to episode of care   . Morbid obesity (HCC)   . Myalgia and myositis, unspecified   . Other dyspnea and respiratory abnormality   . Preeclampsia    with first pregnancy  . Raynaud's disease   . Urinary tract infection, site not specified     Past Surgical History:  Procedure Laterality Date  . CESAREAN SECTION     X 2  . CHOLECYSTECTOMY  03/2006  . CYSTOSCOPY W/ URETERAL STENT PLACEMENT Left 05/14/2016   Procedure: CYSTOSCOPY WITH STENT REPLACEMENT;  Surgeon: [name], MD;  Location: [facility];  Service: Urology;  Laterality: Left;  . CYSTOSCOPY WITH STENT PLACEMENT Left 04/25/2016   Procedure: CYSTOSCOPY WITH STENT PLACEMENT;  Surgeon: [name], MD;  Location: [facility];  Service: Urology;  Laterality: Left;  . DILATION AND CURETTAGE OF UTERUS    .  IVC FILTER INSERTION  03/26/2016   [facility]  . LAPAROSCOPIC GASTRIC RESTRICTIVE DUODENAL PROCEDURE (DUODENAL SWITCH)  03/2016  . LITHOTRIPSY    . TONSILLECTOMY AND ADENOIDECTOMY    . URETEROSCOPY WITH HOLMIUM LASER LITHOTRIPSY Left 05/14/2016   Procedure: URETEROSCOPY WITH HOLMIUM LASER LITHOTRIPSY;  Surgeon: [name], MD;  Location: [facility];  Service: Urology;  Laterality: Left;    Family Psychiatric History: Have reviewed family psychiatric history from my progress  note on 06/20/2017.  Family History:  Family History  Problem Relation Age of Onset  . Depression Father   . Alcohol abuse Father   . Depression Mother   . Hyperlipidemia Mother   . Sleep apnea Mother   . Depression Sister   . Hyperlipidemia Brother   . Depression Brother   . Hyperlipidemia Brother   . Depression Brother   . Alcohol abuse Brother   . Coronary artery disease Maternal Grandmother   . Heart attack Maternal Grandmother   . Diabetes Maternal Grandmother   . Lung cancer Maternal Grandfather   . Emphysema Maternal Grandfather   . Coronary artery disease Maternal Grandfather   . Lupus Unknown        Aunt  . Fibromyalgia Unknown        Aunt    Social History: Have reviewed social history from my progress note on 06/20/2017 Social History   Socioeconomic History  . Marital status: Married    Spouse name: Sierra [name]  . Number of children: 2  . Years of education: Not on file  . Highest education level: Some college, no degree  Occupational History  . Occupation: Arts development officer  Social Needs  . Financial resource strain: Not hard at all  . Food insecurity:    Worry: Never true    Inability: Never true  . Transportation needs:    Medical: No    Non-medical: No  Tobacco Use  . Smoking status: Never Smoker  . Smokeless tobacco: Never Used  Substance and Sexual Activity  . Alcohol use: Yes    Alcohol/week: 2.0 - 3.0 standard drinks    Types: 2 Glasses of wine per week  . Drug use: No  . Sexual activity: Yes    Partners: Male    Birth control/protection: None, IUD  Lifestyle  . Physical activity:    Days per week: 0 days    Minutes per session: 0 min  . Stress: To some extent  Relationships  . Social connections:    Talks on phone: More than three times a week    Gets together: Once a week    Attends religious service: Never    Active member of club or organization: No    Attends meetings of clubs or organizations: Never    Relationship status: Married  Other  Topics Concern  . Not on file  Social History Narrative   1 child, 2 step-sons      Regular exercise-no   Diet: no fast food, likes sweets    Allergies:  Allergies  Allergen Reactions  . Molds & Smuts   . Pollen Extract   . Uncaria Tomentosa (Cats Claw)   . Milk-Related Compounds     Able to tolerate yogurt; mild intolerance to milk/cheese  . Pineapple   . Soy Allergy   . Strawberry Extract     Break out on tongue noted    Metabolic Disorder Labs: Lab Results  Component Value Date   HGBA1C 4.7 (L) 05/22/2017   MPG 88.19 05/22/2017  No results found for: PROLACTIN Lab Results  Component Value Date   CHOL 105 05/22/2017   TRIG 41 05/22/2017   HDL 48 05/22/2017   CHOLHDL 2.2 05/22/2017   VLDL 8 05/22/2017   LDLCALC 49 05/22/2017   LDLCALC 99 09/11/2013   Lab Results  Component Value Date   TSH 0.677 05/22/2017   TSH 3.71 10/01/2016    Therapeutic Level Labs: Lab Results  Component Value Date   LITHIUM 0.3 (L) 11/18/2017   No results found for: VALPROATE No components found for:  CBMZ  Current Medications: Current Outpatient Medications  Medication Sig Dispense Refill  . ARIPiprazole (ABILIFY) 30 MG tablet Take 1 tablet (30 mg total) by mouth daily. 30 tablet 0  . Biotin 5 MG TBDP Take by mouth.    Marland Kitchen buPROPion (WELLBUTRIN XL) 150 MG 24 hr tablet Take 1 tablet (150 mg total) by mouth daily. 90 tablet 1  . calcium citrate-vitamin D 500-400 MG-UNIT chewable tablet Chew 1 tablet by mouth daily.     Marland Kitchen escitalopram (LEXAPRO) 20 MG tablet Take 1 tablet (20 mg total) by mouth daily. 90 tablet 1  . hydrOXYzine (ATARAX/VISTARIL) 25 MG tablet Take 1 tablet (25 mg total) by mouth daily as needed for anxiety. 90 tablet 1  . Lactobacillus Rhamnosus, GG, (CULTURELLE) CAPS Take by mouth.    . lamoTRIgine (LAMICTAL) 100 MG tablet Take 1 tablet (100 mg total) by mouth daily. 90 tablet 1  . lithium carbonate (LITHOBID) 300 MG CR tablet Take 1 tablet (300 mg total) by mouth  3 (three) times daily with meals. Mood symptoms 90 tablet 0  . montelukast (SINGULAIR) 10 MG tablet Take 10 mg by mouth daily.    . Multiple Vitamin (MULTIVITAMIN) tablet Take 1 tablet by mouth daily.    Marland Kitchen NIFEdipine (PROCARDIA-XL/ADALAT CC) 60 MG 24 hr tablet Take 60 mg by mouth daily.     Marland Kitchen omeprazole (PRILOSEC) 20 MG capsule Take 20 mg by mouth daily.    Marland Kitchen omeprazole (PRILOSEC) 20 MG capsule omeprazole 20 mg capsule,delayed release  TAKE 1 CAPSULE BY MOUTH ONCE DAILY    . oxyCODONE-acetaminophen (PERCOCET/ROXICET) 5-325 MG tablet Take 1 tablet by mouth every 6 (six) hours as needed for severe pain. 20 tablet 0  . PARAGARD INTRAUTERINE COPPER IUD IUD by Intrauterine route.    . predniSONE (DELTASONE) 50 MG tablet Take 1 tablet (50 mg total) by mouth daily with breakfast. 5 tablet 0  . Specialty Vitamins Products (BIOTIN PLUS KERATIN PO) Take 1 tablet by mouth daily.    . tamsulosin (FLOMAX) 0.4 MG CAPS capsule Take 1 capsule (0.4 mg total) by mouth daily. 14 capsule 1  . traMADol (ULTRAM) 50 MG tablet tramadol 50 mg tablet  Take 1 tablet every 6 hours by oral route.    . traZODone (DESYREL) 100 MG tablet Take 1 tablet (100 mg total) by mouth at bedtime as needed for sleep. 90 tablet 1  . benztropine (COGENTIN) 0.5 MG tablet Take 1 tablet (0.5 mg total) by mouth daily. For tremors 30 tablet 1   No current facility-administered medications for this visit.      Musculoskeletal: Strength & Muscle Tone: within normal limits Gait & Station: normal Patient leans: N/A  Psychiatric Specialty Exam: Review of Systems  Psychiatric/Behavioral: The patient is nervous/anxious.   All other systems reviewed and are negative.   Blood pressure 126/74, pulse 70, temperature 97.8 F (36.6 C), temperature source Oral, weight 293 lb 6.4 oz (133.1 kg).Body mass index  is 50.36 kg/m.  General Appearance: Casual  Eye Contact:  Fair  Speech:  Clear and Coherent  Volume:  Normal  Mood:  Anxious  Affect:   Congruent  Thought Process:  Goal Directed and Descriptions of Associations: Intact  Orientation:  Full (Time, Place, and Person)  Thought Content: Logical   Suicidal Thoughts:  No  Homicidal Thoughts:  No  Memory:  Immediate;   Fair Recent;   Fair Remote;   Fair  Judgement:  Fair  Insight:  Fair  Psychomotor Activity:  Tremor  Concentration:  Concentration: Fair and Attention Span: Fair  Recall:  [employer] of Knowledge: Fair  Language: Fair  Akathisia:  No  Handed:  Right  AIMS (if indicated): 5  Assets:  Communication Skills Desire for Improvement Social Support  ADL's:  Intact  Cognition: WNL  Sleep:  Fair   Screenings: AIMS     Office Visit from 11/20/2017 in [facility] Admission (Discharged) from 05/21/2017 in [facility]PAT[facility]ICINE  AIMS Total Score  5  0    AUDIT     Admission (Discharged) from 05/21/2017 in [facility]PATIENT BEH[facility] MEDICINE  Alcohol Use Disorder Identification Test Final Score (AUDIT)  6       Assessment and Plan: Swan is a 37 year old Caucasian female who has a history of bipolar disorder, anxiety disorder, binge eating disorder, presented to the clinic today for a follow-up visit.  Patient with history of multiple inpatient mental health admissions in the past.  Patient is currently on lithium as well as a higher dose of Abilify.  She does have some adverse effects to the medication like tremors.  Patient will continue psychotherapy sessions at this time.  She does report some improvement in her mood symptoms.  Will continue plan as noted below.  Plan Bipolar disorder Abilify 30 mg p.o. daily Continue Lamictal 100 mg p.o. daily Increase lithium to 900 mg p.o. daily.  Discussed with patient she could take it in divided dosage off once a day. Continue Wellbutrin XL 150 mg p.o. daily. Provided her with another lab slip to get lithium levels done in 5 days.  I have reviewed lithium levels done on  11/19/2017-0.3 mmol/L-subtherapeutic.  For anxiety disorder Lexapro 20 mg p.o. daily Hydroxyzine as needed  For neuroleptic induced parkinsonism-tremors Add Cogentin 0.5 mg p.o. daily. Aims equals 5  For insomnia Trazodone as needed.  Follow-up in clinic in 2 weeks or sooner if needed.  More than 50 % of the time was spent for psychoeducation and supportive psychotherapy and care coordination.  This note was generated in part or whole with voice recognition software. Voice recognition is usually quite accurate but there are transcription errors that can and very often do occur. I apologize for any typographical errors that were not detected and corrected.     Jomarie Longs, MD 11/21/2017, 8:29 AM

## 2017-11-20 NOTE — Patient Instructions (Signed)
Benztropine tablets What is this medicine? BENZTROPINE (BENZ troe peen) is for certain movement problems due to Parkinson's disease, certain medicines, or other causes. This medicine may be used for other purposes; ask your health care provider or pharmacist if you have questions. COMMON BRAND NAME(S): Cogentin What should I tell my health care provider before I take this medicine? They need to know if you have any of these conditions: -glaucoma -heart disease or a rapid heartbeat -mental problems -prostate trouble -tardive dyskinesia -an unusual or allergic reaction to benztropine, other medicines, lactose, foods, dyes, or preservatives -pregnant or trying to get pregnant -breast-feeding How should I use this medicine? Take this medicine by mouth with a full glass of water. Follow the directions on the prescription label. Take your medicine at regular intervals. Do not take your medicine more often than directed. Talk to your pediatrician regarding the use of this medicine in children. While this drug may be prescribed for children as young as 3 years for selected conditions, precautions do apply. Overdosage: If you think you have taken too much of this medicine contact a poison control center or emergency room at once. NOTE: This medicine is only for you. Do not share this medicine with others. What if I miss a dose? If you miss a dose, take it as soon as you can. If it is almost time for your next dose, take only that dose. Do not take double or extra doses. What may interact with this medicine? -haloperidol -medicines for movement abnormalities like Parkinson's disease -phenothiazines like chlorpromazine, mesoridazine, prochlorperazine, thioridazine -some antidepressants like amitriptyline, desipramine, doxepin, nortriptyline -stimulant medicines for attention, weight loss, and to stay awake -tegaserod This list may not describe all possible interactions. Give your health care  provider a list of all the medicines, herbs, non-prescription drugs, or dietary supplements you use. Also tell them if you smoke, drink alcohol, or use illegal drugs. Some items may interact with your medicine. What should I watch for while using this medicine? Visit your doctor or health care professional for regular checks on your progress. You may get drowsy or dizzy. Do not drive, use machinery, or do anything that needs mental alertness until you know how this medicine affects you. Do not stand or sit up quickly, especially if you are an older patient. This reduces the risk of dizzy or fainting spells. Alcohol may interfere with the effect of this medicine. Avoid alcoholic drinks. Your mouth may get dry. Chewing sugarless gum or sucking hard candy, and drinking plenty of water may help. Contact your doctor if the problem does not go away or is severe. This medicine may cause dry eyes and blurred vision. If you wear contact lenses you may feel some discomfort. Lubricating drops may help. See your eye doctor if the problem does not go away or is severe. You may sweat less than usual while you are taking this medicine. As a result your body temperature could rise to a dangerous level. Be careful not to get overheated during exercise or in hot weather. You could get heat stroke. Avoid taking hot baths and using hot tubs and saunas. What side effects may I notice from receiving this medicine? Side effects that you should report to your doctor or health care professional as soon as possible: -allergic reactions like skin rash, itching or hives, swelling of the face, lips, or tongue -changes in vision -confusion -decreased sweating or heat intolerance -depression -fast, irregular heartbeat -hallucinations -memory loss -muscle weakness -pain or difficulty   passing urine -vomiting Side effects that usually do not require medical attention (report to your doctor or health care professional if they  continue or are bothersome): -constipation -dry mouth -nausea This list may not describe all possible side effects. Call your doctor for medical advice about side effects. You may report side effects to FDA at 1-800-FDA-1088. Where should I keep my medicine? Keep out of the reach of children. Store below 30 degrees C (86 degrees F). Keep container tightly closed. Throw away any unused medicine after the expiration date. NOTE: This sheet is a summary. It may not cover all possible information. If you have questions about this medicine, talk to your doctor, pharmacist, or health care provider.  2018 Elsevier/Gold Standard (2007-04-02 15:38:20)  

## 2017-11-21 ENCOUNTER — Encounter: Payer: Self-pay | Admitting: Psychiatry

## 2017-11-22 ENCOUNTER — Encounter: Admitting: Obstetrics & Gynecology

## 2017-11-24 IMAGING — CR DG CHEST 2V
1 series · 2 of 2 positions shown · non-contrast
Comparison: 02/07/2006

CLINICAL DATA: Preop x-ray prior to bariatric surgery.

EXAM:
CHEST  2 VIEW

[Series 1: dg chest 2 view · 0.14mm/px · 2 of 2 slices shown]
[im 1/2]
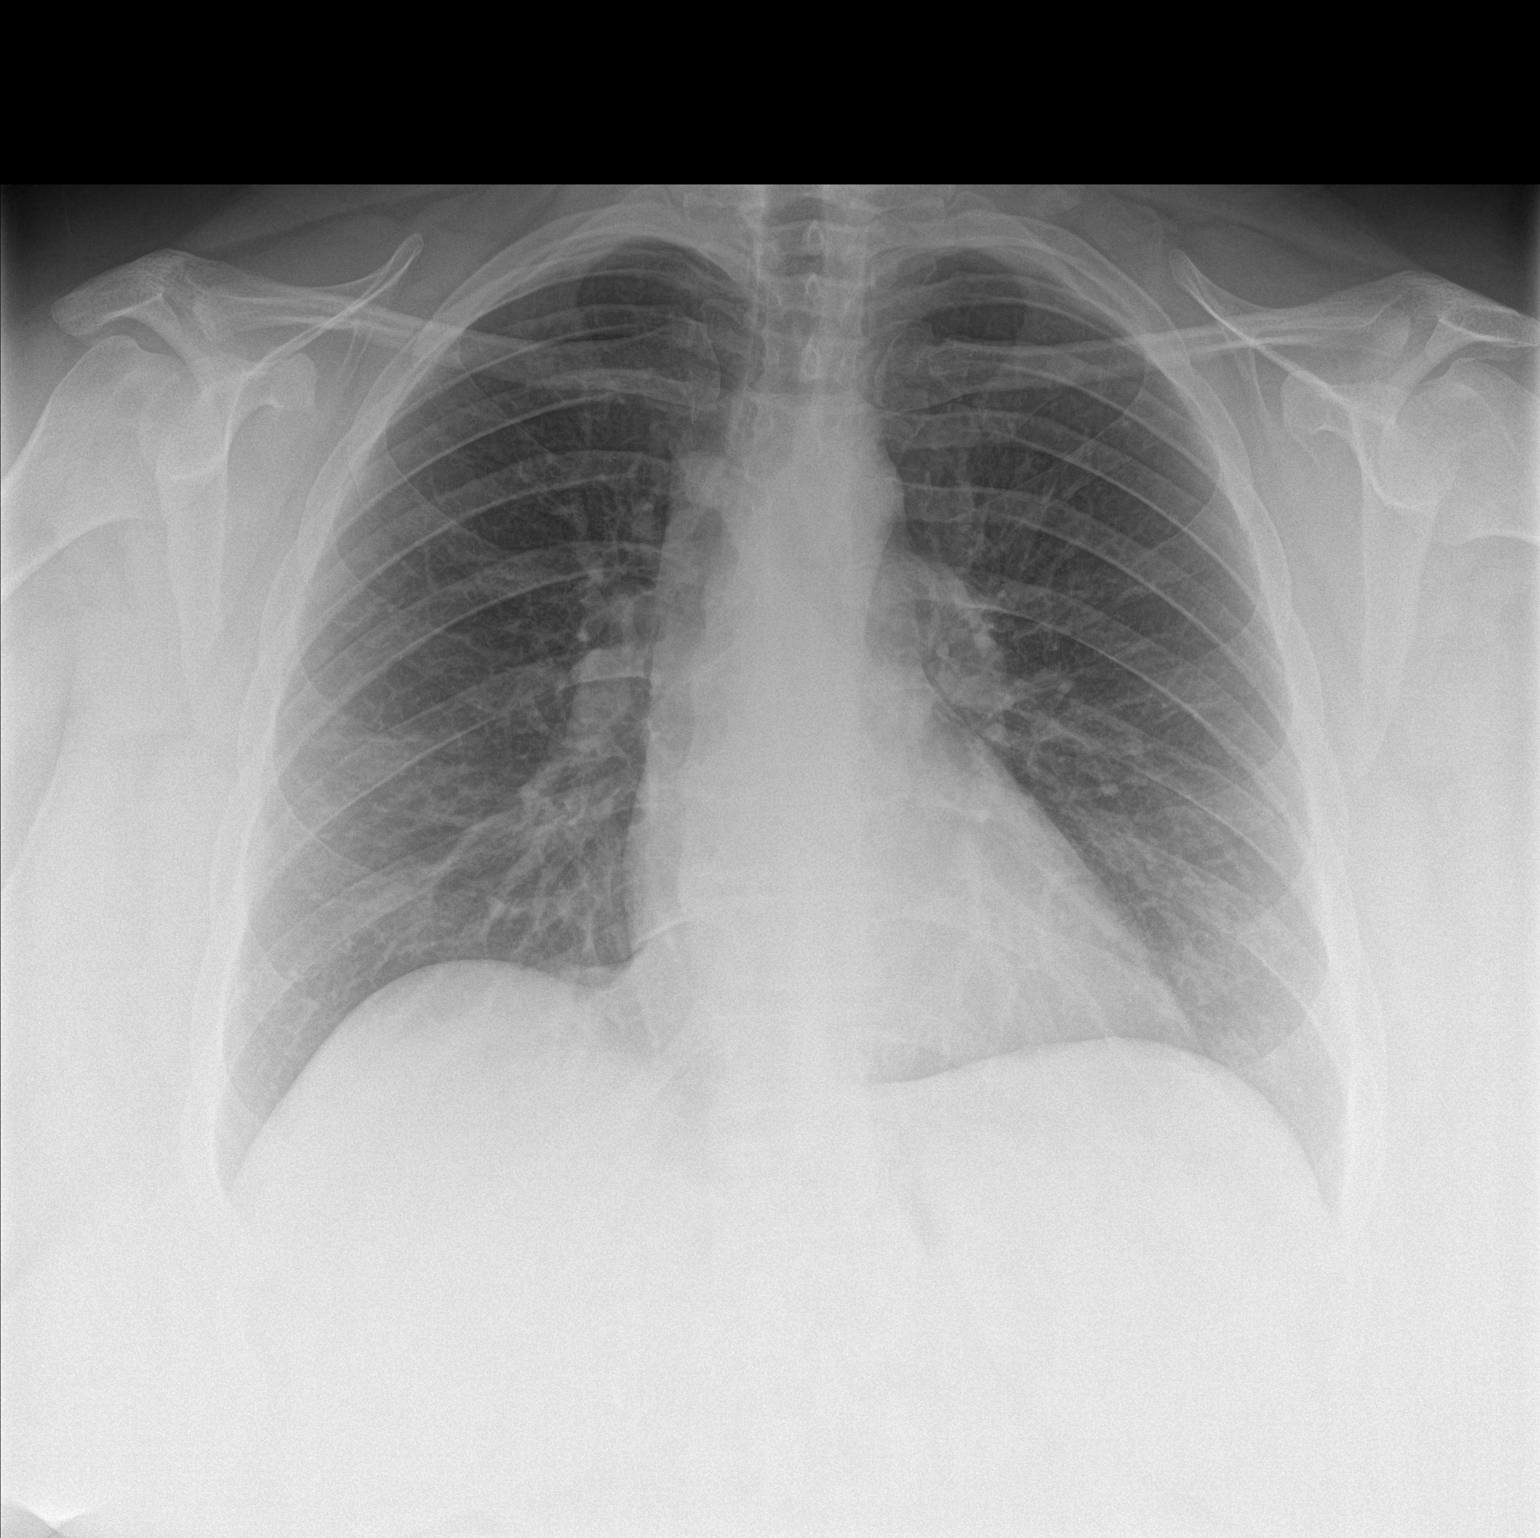
[im 2/2]
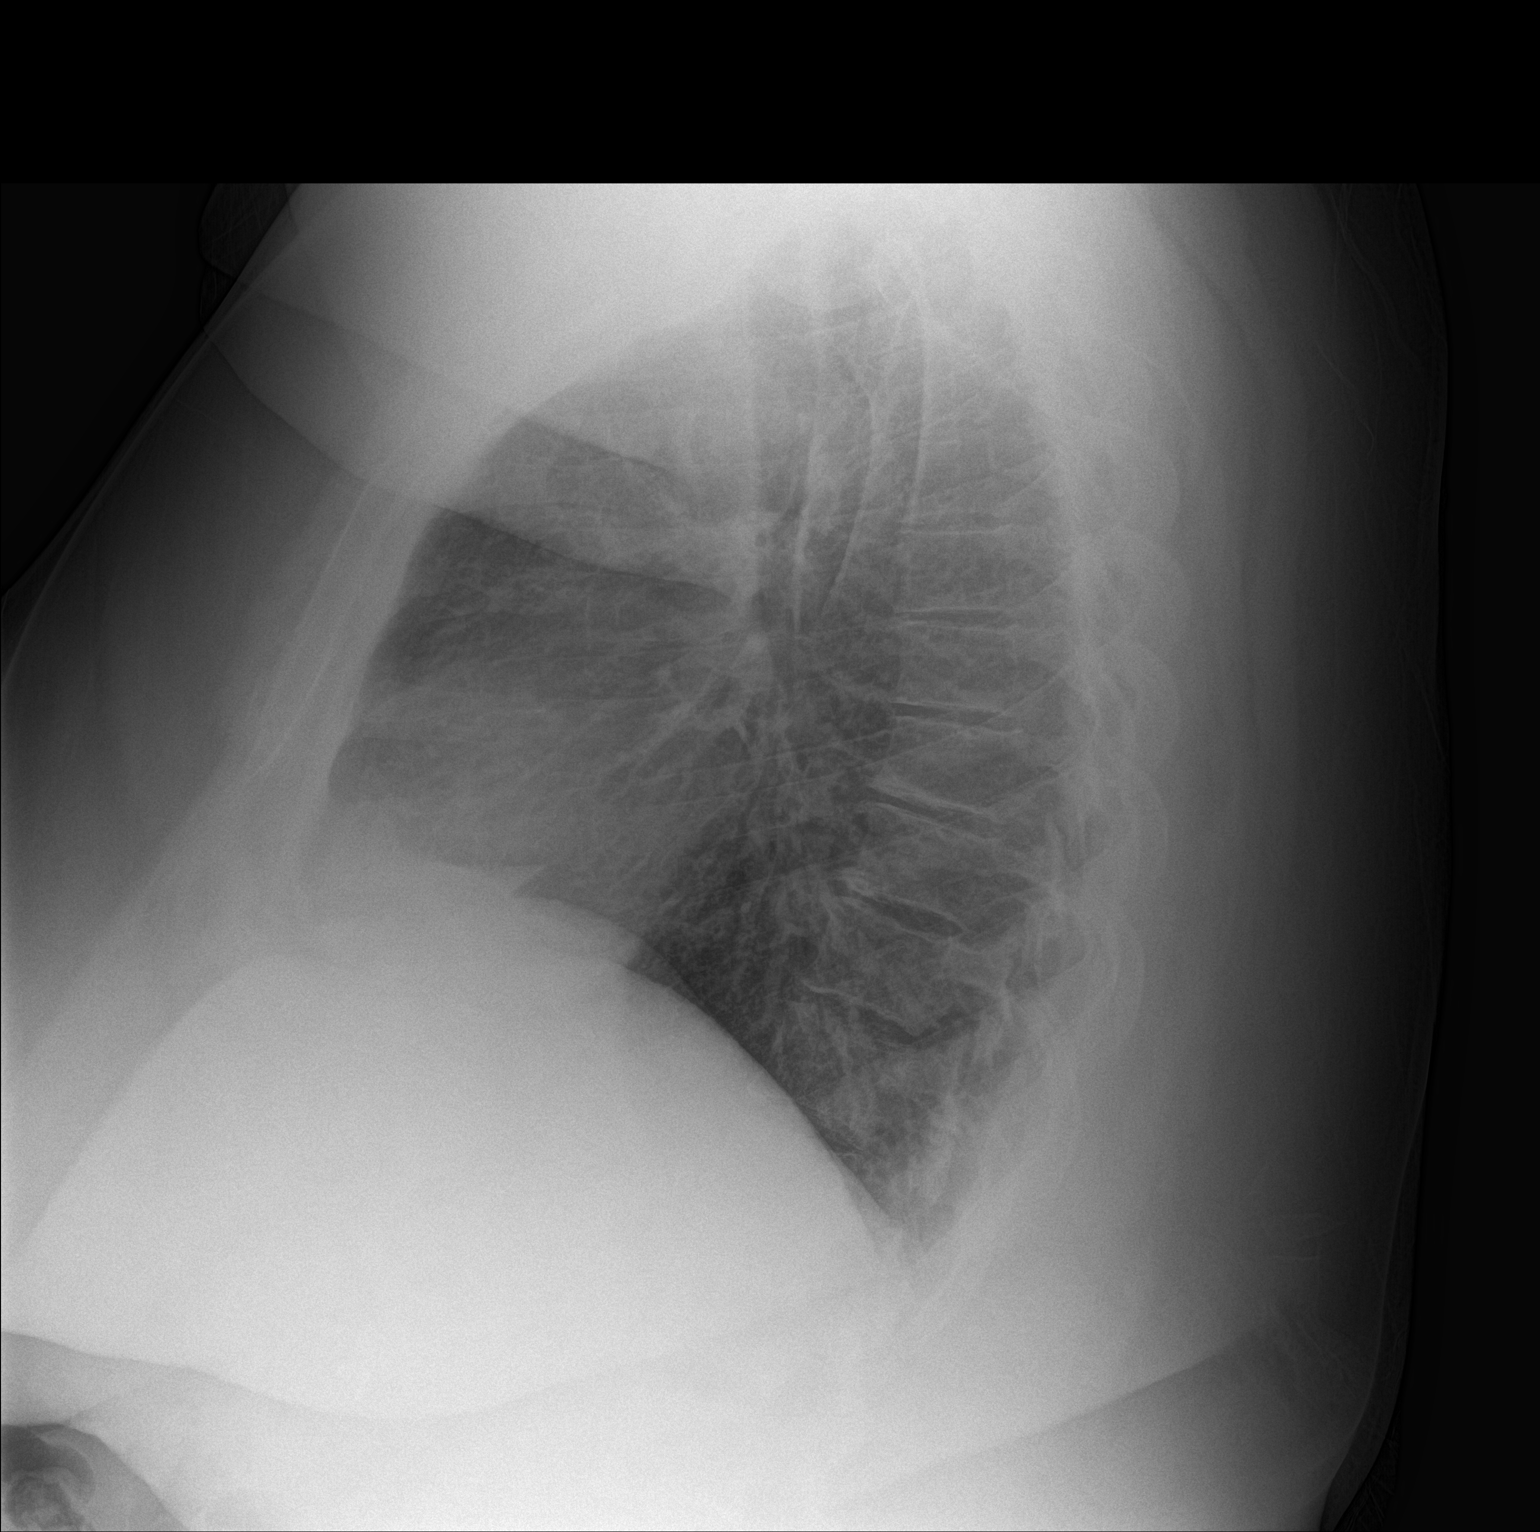

[2 of 2 positions shown; findings below may reference images not displayed]

FINDINGS: The heart size and mediastinal contours are within normal limits.
Both lungs are clear. The visualized skeletal structures are
unremarkable.
IMPRESSION: No active cardiopulmonary disease.

## 2017-11-26 ENCOUNTER — Telehealth: Payer: Self-pay

## 2017-11-26 ENCOUNTER — Other Ambulatory Visit: Payer: Self-pay | Admitting: Psychiatry

## 2017-11-26 NOTE — Telephone Encounter (Signed)
Yes please ask her to stop it and come back in sooner to see me

## 2017-11-26 NOTE — Telephone Encounter (Signed)
pt called left message that she feels sick with the lithium increase.  she states she stay sick all day and that last night she did not take it and she feels better wants to know if she can stop taking it.

## 2017-11-27 ENCOUNTER — Telehealth: Payer: Self-pay | Admitting: Psychiatry

## 2017-11-27 LAB — LITHIUM LEVEL: LITHIUM LVL: 0.3 mmol/L — AB (ref 0.6–1.2)

## 2017-11-27 NOTE — Telephone Encounter (Signed)
Referral closed due to patient has not returned phone message

## 2017-11-27 NOTE — Telephone Encounter (Signed)
Called patient to discuss her Dierdre SearlesLi levels and recent side effects. Left message , advised to call back.

## 2017-11-29 ENCOUNTER — Other Ambulatory Visit: Payer: Self-pay

## 2017-11-29 ENCOUNTER — Encounter: Payer: Self-pay | Admitting: Psychiatry

## 2017-11-29 ENCOUNTER — Ambulatory Visit (INDEPENDENT_AMBULATORY_CARE_PROVIDER_SITE_OTHER): Admitting: Psychiatry

## 2017-11-29 VITALS — BP 131/71 | HR 76 | Temp 99.0°F | Wt 300.6 lb

## 2017-11-29 DIAGNOSIS — F411 Generalized anxiety disorder: Secondary | ICD-10-CM

## 2017-11-29 DIAGNOSIS — F3160 Bipolar disorder, current episode mixed, unspecified: Secondary | ICD-10-CM

## 2017-11-29 DIAGNOSIS — F5081 Binge eating disorder: Secondary | ICD-10-CM | POA: Diagnosis not present

## 2017-11-29 DIAGNOSIS — G2111 Neuroleptic induced parkinsonism: Secondary | ICD-10-CM

## 2017-11-29 MED ORDER — ARIPIPRAZOLE 30 MG PO TABS: 30.0000 mg | ORAL_TABLET | Freq: Every day | ORAL | 0 refills | Status: DC

## 2017-11-29 MED ORDER — BUPROPION HCL ER (XL) 150 MG PO TB24: 150.0000 mg | ORAL_TABLET | Freq: Every day | ORAL | 1 refills | Status: DC

## 2017-11-29 MED ORDER — AMANTADINE HCL 100 MG PO CAPS: 100.0000 mg | ORAL_CAPSULE | Freq: Two times a day (BID) | ORAL | 0 refills | Status: DC

## 2017-11-29 MED ORDER — ESCITALOPRAM OXALATE 20 MG PO TABS: 10.0000 mg | ORAL_TABLET | Freq: Every day | ORAL | 1 refills | Status: DC

## 2017-11-29 MED ORDER — LITHIUM CARBONATE ER 300 MG PO TBCR: 300.0000 mg | EXTENDED_RELEASE_TABLET | Freq: Every day | ORAL | 0 refills | Status: DC

## 2017-11-29 NOTE — Patient Instructions (Signed)
Amantadine capsules or tablets What is this medicine? AMANTADINE (a MAN ta deen) is an antiviral medicine. It is used to prevent and to treat a specific type of flu called influenza A. It will not work for colds, other types of flu, or other viral infections. This medicine is also used to treat Parkinson's disease and other movement disorders. This medicine may be used for other purposes; ask your health care provider or pharmacist if you have questions. COMMON BRAND NAME(S): Symmetrel What should I tell my health care provider before I take this medicine? They need to know if you have any of these conditions: -depression or other mental illness -eczema -glaucoma -heart failure -if you drink alcohol -kidney disease -low blood pressure -narcolepsy -seizures -sleep apnea -suicidal thoughts, plans, or attempt; a previous suicide attempt by you or a family member -an unusual or allergic reaction to amantadine, other medicines, foods, dyes, or preservatives -pregnant or trying to get pregnant -breast-feeding How should I use this medicine? Take this medicine by mouth with a full glass of water. Follow the directions on the prescription label. Take your medicine at regular intervals. Do not take your medicine more often than directed. Take all of your medicine as directed even if you think your are better. Do not skip doses or stop your medicine early. Contact your pediatrician or health care professional regarding the useof this medicine in children. While this drug may be prescribed for children as young as 1 year old for selected conditions, precautions do apply. Patients over 65 years old may have a stronger reaction and need a smaller dose. Overdosage: If you think you have taken too much of this medicine contact a poison control center or emergency room at once. NOTE: This medicine is only for you. Do not share this medicine with others. What if I miss a dose? If you miss a dose, take it as  soon as you can. If it is almost time for your next dose, take only that dose. Do not take double or extra doses. What may interact with this medicine? -acetazolamide -alcohol -atropine -antihistamines for allergy, cough and cold -benztropine -bupropion -certain medicines for bladder problems like oxybutynin, tolterodine -certain medicines for stomach problems like dicyclomine, hyoscyamine -certain medicines for travel sickness like scopolamine -ipratropium -methazolamide -quinidine -quinine -sodium bicarbonate -some flu vaccines -thioridazine -trihexyphenidyl This list may not describe all possible interactions. Give your health care provider a list of all the medicines, herbs, non-prescription drugs, or dietary supplements you use. Also tell them if you smoke, drink alcohol, or use illegal drugs. Some items may interact with your medicine. What should I watch for while using this medicine? Tell your doctor or health care professional if your symptoms do not improve. You may get drowsy or dizzy. Do not drive, use machinery, or do anything that needs mental alertness until you know how this medicine affects you. Do not stand or sit up quickly, especially if you are an older patient. This reduces the risk of dizzy or fainting spells. Alcohol may interfere with the effect of this medicine. Avoid alcoholic drinks. If you are taking this medicine for Parkinson's disease or a movement disorder, be careful. Slowly increase your daily activities as your condition improves. Do not suddenly stop taking your medicine because you may develop a severe reaction. You may get dry mouth or eyes, or blurry vision while taking this medicine. Try sugarless gum or hard candy, and drink 6 to 8 glasses of water daily. Brush and floss   your teeth regularly and carefully to avoid teeth and gum problems. You may want to wet your eyes with lubricating eye drops. Talk to your doctor if these symptoms become a  problem. There have been reports of increased sexual urges or other strong urges such as gambling while taking some medicines for Parkinson's disease. If you experience any of these urges while taking this medicine, you should report it to your health care provider as soon as possible. You should check your skin often for changes to moles and new growths while taking this medicine. Call your doctor if you notice any of these changes. What side effects may I notice from receiving this medicine? Side effects that you should report to your doctor or health care professional as soon as possible: -allergic reactions like skin rash, itching or hives, swelling of the face, lips, or tongue -anxiety -breathing problems -changes in vision -color changes on the skin -confusion -depressed mood -eye pain -falling asleep during normal activities like driving -hallucination, loss of contact with reality -new or increased gambling urges, sexual urges, uncontrolled spending, binge or compulsive eating, or other urges -seizures -signs and symptoms of low blood pressure like dizziness; feeling faint or lightheaded, falls; unusually weak or tired -swelling in your legs and feet -suicidal thoughts or other mood changes -trouble passing urine or change in the amount of urine -trouble sleeping -uncontrolled movements of the mouth, head, hands, feet, shoulders, eyelids or other unusual muscle movements Side effects that usually do not require medical attention (report these to your doctor or health care professional if they continue or are bothersome): -constipation -dizziness -drowsiness -dry mouth -headache -nausea This list may not describe all possible side effects. Call your doctor for medical advice about side effects. You may report side effects to FDA at 1-800-FDA-1088. Where should I keep my medicine? Keep out of the reach of children. Store at room temperature between 20 and 25 degrees C (68 and  77 degrees F). Keep container tightly closed. Throw away any unused medicine after the expiration date. NOTE: This sheet is a summary. It may not cover all possible information. If you have questions about this medicine, talk to your doctor, pharmacist, or health care provider.  2018 Elsevier/Gold Standard (2015-09-16 12:06:00)  

## 2017-11-29 NOTE — Progress Notes (Signed)
BH MD OP Progress Note  11/29/2017 12:14 PM Sierra [name]  MRN:  [id number]  Chief Complaint: ' I am here for follow up.' Chief Complaint    Follow-up; Medication Refill     HPI: Sierra [name] is a 37 year old Caucasian female, married, lives in [place], has a history of bipolar disorder, binge eating disorder, anxiety disorder, neuroleptic induced parkinsonism, presented to the clinic today for a follow-up visit.  Patient today reports she stopped the lithium since she started having side effects with the increased dosage.  Patient reports her tremors of her bilateral hands worsened and she also had some sleep problems.  She reports she feels better but continues to have some mild tremors of her bilateral hands.  She does not think the Cogentin is helpful with the tremors.  Patient reports she is feeling better with regards to her mood.  She is compliant with all her medications.  She denies any suicidality at this time.  She does have a history of chronic suicidal thoughts.  Discussed with patient that lithium helps to keep her symptoms stable and since she did not have any significant side effects at a lower dosage of lithium , could restart it at a small dose of 300 mg.  She agrees with plan.  Patient reports sleep is good.  She reports she started seeing someone recently and reports the relationship is going well but slow.  She reports she wants it to go that way.  She reports she and her husband are separated even though they live in the same house.  She reports they stay together to coparent.  Patient reports she looks forward to Thanksgiving holidays.  She denies any other concerns today. Visit Diagnosis:    ICD-10-CM   1. Bipolar I disorder, most recent episode mixed (HCC) F31.60 ARIPiprazole (ABILIFY) 30 MG tablet    buPROPion (WELLBUTRIN XL) 150 MG 24 hr tablet    lithium carbonate (LITHOBID) 300 MG CR tablet  2. GAD (generalized anxiety disorder) F41.1 escitalopram  (LEXAPRO) 20 MG tablet  3. 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 Binge eating disorder F50.81     Past Psychiatric History: I have reviewed past psychiatric history from my progress note on 06/20/2017  Past Medical History:  Past Medical History:  Diagnosis Date  . Abdominal pain, other specified site   . 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 Anxiety state, unspecified   . Bipolar 1 disorder, depressed (HCC) 05/21/2017  . BIPOLAR AFFECTIVE DISORDER 12/23/2006   Qualifier: Diagnosis of  By: Ermalene SearingBedsole MD, Amy    . Bipolar affective disorder (HCC) 10/12/2011  . Bipolar disorder, unspecified (HCC)   . Depressive disorder, not elsewhere classified   . Edema   . Esophageal reflux   . Fibromyalgia   . Headache   . History of kidney stones   . Mild or unspecified pre-eclampsia, unspecified as to episode of care   . Morbid obesity (HCC)   . Myalgia and myositis, unspecified   . Other dyspnea and respiratory abnormality   . Preeclampsia    with first pregnancy  . Raynaud's disease   . Urinary tract infection, site not specified     Past Surgical History:  Procedure Laterality Date  . CESAREAN SECTION     X 2  . CHOLECYSTECTOMY  03/2006  . CYSTOSCOPY W/ URETERAL STENT PLACEMENT Left 05/14/2016   Procedure: CYSTOSCOPY WITH STENT REPLACEMENT;  Surgeon: Vanna ScotlandAshley Brandon, MD;  Location: [facility];  Service: Urology;  Laterality: Left;  . CYSTOSCOPY WITH STENT PLACEMENT Left 04/25/2016   Procedure: CYSTOSCOPY WITH STENT PLACEMENT;  Surgeon: [name], MD;  Location: [facility];  Service: Urology;  Laterality: Left;  . DILATION AND CURETTAGE OF UTERUS    . IVC FILTER INSERTION  03/26/2016   [facility]  . LAPAROSCOPIC GASTRIC RESTRICTIVE DUODENAL PROCEDURE (DUODENAL SWITCH)  03/2016  . LITHOTRIPSY    . TONSILLECTOMY AND ADENOIDECTOMY    . URETEROSCOPY WITH HOLMIUM LASER LITHOTRIPSY Left 05/14/2016   Procedure: URETEROSCOPY WITH HOLMIUM  LASER LITHOTRIPSY;  Surgeon: [name], MD;  Location: [facility];  Service: Urology;  Laterality: Left;    Family Psychiatric History: I have reviewed family psychiatric history from my progress note on 06/20/2017  Family History:  Family History  Problem Relation Age of Onset  . Depression Father   . Alcohol abuse Father   . Depression Mother   . Hyperlipidemia Mother   . Sleep apnea Mother   . Depression Sister   . Hyperlipidemia Brother   . Depression Brother   . Hyperlipidemia Brother   . Depression Brother   . Alcohol abuse Brother   . Coronary artery disease Maternal Grandmother   . Heart attack Maternal Grandmother   . Diabetes Maternal Grandmother   . Lung cancer Maternal Grandfather   . Emphysema Maternal Grandfather   . Coronary artery disease Maternal Grandfather   . Lupus Unknown        Aunt  . Fibromyalgia Unknown        Aunt    Social History: I have reviewed social history from my progress note on 06/20/2017. Social History   Socioeconomic History  . Marital status: Married    Spouse name: brian  . Number of children: 2  . Years of education: Not on file  . Highest education level: Some college, no degree  Occupational History  . Occupation: Arts development officer  Social Needs  . Financial resource strain: Not hard at all  . Food insecurity:    Worry: Never true    Inability: Never true  . Transportation needs:    Medical: No    Non-medical: No  Tobacco Use  . Smoking status: Never Smoker  . Smokeless tobacco: Never Used  Substance and Sexual Activity  . Alcohol use: Yes    Alcohol/week: 2.0 - 3.0 standard drinks    Types: 2 Glasses of wine per week  . Drug use: No  . Sexual activity: Yes    Partners: Male    Birth control/protection: None, IUD  Lifestyle  . Physical activity:    Days per week: 0 days    Minutes per session: 0 min  . Stress: To some extent  Relationships  . Social connections:    Talks on phone: More than three times a week     Gets together: Once a week    Attends religious service: Never    Active member of club or organization: No    Attends meetings of clubs or organizations: Never    Relationship status: Married  Other Topics Concern  . Not on file  Social History Narrative   1 child, 2 step-sons      Regular exercise-no   Diet: no fast food, likes sweets    Allergies:  Allergies  Allergen Reactions  . Molds & Smuts   . Pollen Extract   . Uncaria Tomentosa (Cats Claw)   . Milk-Related Compounds     Able to tolerate yogurt; mild intolerance to milk/cheese  .  Pineapple   . Soy Allergy   . Strawberry Extract     Break out on tongue noted    Metabolic Disorder Labs: Lab Results  Component Value Date   HGBA1C 4.7 (L) 05/22/2017   MPG 88.19 05/22/2017   No results found for: PROLACTIN Lab Results  Component Value Date   CHOL 105 05/22/2017   TRIG 41 05/22/2017   HDL 48 05/22/2017   CHOLHDL 2.2 05/22/2017   VLDL 8 05/22/2017   LDLCALC 49 05/22/2017   LDLCALC 99 09/11/2013   Lab Results  Component Value Date   TSH 0.677 05/22/2017   TSH 3.71 10/01/2016    Therapeutic Level Labs: Lab Results  Component Value Date   LITHIUM 0.3 (L) 11/18/2017   No results found for: VALPROATE No components found for:  CBMZ  Current Medications: Current Outpatient Medications  Medication Sig Dispense Refill  . ARIPiprazole (ABILIFY) 30 MG tablet Take 1 tablet (30 mg total) by mouth daily. 30 tablet 0  . Biotin 5 MG TBDP Take by mouth.    Marland Kitchen buPROPion (WELLBUTRIN XL) 150 MG 24 hr tablet Take 1 tablet (150 mg total) by mouth daily. 90 tablet 1  . calcium citrate-vitamin D 500-400 MG-UNIT chewable tablet Chew 1 tablet by mouth daily.     Marland Kitchen escitalopram (LEXAPRO) 20 MG tablet Take 0.5 tablets (10 mg total) by mouth daily. 30 tablet 1  . hydrOXYzine (ATARAX/VISTARIL) 25 MG tablet Take 1 tablet (25 mg total) by mouth daily as needed for anxiety. 90 tablet 1  . Lactobacillus Rhamnosus, GG, (CULTURELLE)  CAPS Take by mouth.    . lamoTRIgine (LAMICTAL) 100 MG tablet Take 1 tablet (100 mg total) by mouth daily. 90 tablet 1  . lithium carbonate (LITHOBID) 300 MG CR tablet Take 1 tablet (300 mg total) by mouth daily with supper. Mood symptoms 90 tablet 0  . montelukast (SINGULAIR) 10 MG tablet Take 10 mg by mouth daily.    . Multiple Vitamin (MULTIVITAMIN) tablet Take 1 tablet by mouth daily.    Marland Kitchen NIFEdipine (PROCARDIA-XL/ADALAT CC) 60 MG 24 hr tablet Take 60 mg by mouth daily.     Marland Kitchen omeprazole (PRILOSEC) 20 MG capsule Take 20 mg by mouth daily.    Marland Kitchen omeprazole (PRILOSEC) 20 MG capsule omeprazole 20 mg capsule,delayed release  TAKE 1 CAPSULE BY MOUTH ONCE DAILY    . oxyCODONE-acetaminophen (PERCOCET/ROXICET) 5-325 MG tablet Take 1 tablet by mouth every 6 (six) hours as needed for severe pain. 20 tablet 0  . PARAGARD INTRAUTERINE COPPER IUD IUD by Intrauterine route.    . predniSONE (DELTASONE) 50 MG tablet Take 1 tablet (50 mg total) by mouth daily with breakfast. 5 tablet 0  . Specialty Vitamins Products (BIOTIN PLUS KERATIN PO) Take 1 tablet by mouth daily.    . tamsulosin (FLOMAX) 0.4 MG CAPS capsule Take 1 capsule (0.4 mg total) by mouth daily. 14 capsule 1  . traMADol (ULTRAM) 50 MG tablet tramadol 50 mg tablet  Take 1 tablet every 6 hours by oral route.    . traZODone (DESYREL) 100 MG tablet Take 1 tablet (100 mg total) by mouth at bedtime as needed for sleep. 90 tablet 1  . amantadine (SYMMETREL) 100 MG capsule Take 1 capsule (100 mg total) by mouth 2 (two) times daily. 30 capsule 0  . Cholecalciferol (VITAMIN D3) 125 MCG (5000 UT) CAPS Take 5,000 Units by mouth daily.  2   No current facility-administered medications for this visit.  Musculoskeletal: Strength & Muscle Tone: within normal limits Gait & Station: normal Patient leans: N/A  Psychiatric Specialty Exam: Review of Systems  Psychiatric/Behavioral: Positive for depression (improving).  All other systems reviewed and  are negative.   Blood pressure 131/71, pulse 76, temperature 99 F (37.2 C), temperature source Oral, weight (!) 300 lb 9.6 oz (136.4 kg).Body mass index is 51.6 kg/m.  General Appearance: Casual  Eye Contact:  Fair  Speech:  Clear and Coherent  Volume:  Normal  Mood:  Depressed improving  Affect:  Appropriate  Thought Process:  Goal Directed and Descriptions of Associations: Intact  Orientation:  Full (Time, Place, and Person)  Thought Content: Logical   Suicidal Thoughts:  No  Homicidal Thoughts:  No  Memory:  Immediate;   Fair Recent;   Fair Remote;   Fair  Judgement:  Fair  Insight:  Fair  Psychomotor Activity:  Tremor  Concentration:  Concentration: Fair and Attention Span: Fair  Recall:  [employer] of Knowledge: Fair  Language: Fair  Akathisia:  No  Handed:  Right  AIMS (if indicated): 7  Assets:  Communication Skills Desire for Improvement Social Support  ADL's:  Intact  Cognition: WNL  Sleep:  Fair   Screenings: AIMS     Office Visit from 11/29/2017 in [facility] Office Visit from 11/20/2017 in [facility] Admission (Discharged) from 05/21/2017 in [facility]  AIMS Total Score  7  5  0    AUDIT     Admission (Discharged) from 05/21/2017 in [facility]  Alcohol Use Disorder Identification Test Final Score (AUDIT)  6       Assessment and Plan: Sierra [name] is a 37 year old Caucasian female who has a history of bipolar disorder, anxiety disorder, binge eating disorder, presented to the clinic today for a follow-up visit.  Patient with history of multiple inpatient mental health admissions in the past.  Patient recently had worsening depressive symptoms and was started on lithium and Abilify.  She however developed side effects to lithium and the dosage was increased.  Discussed with patient that lithium can be reinitiated at a lower dose and she agrees with plan.   Patient will continue psychotherapy sessions.  Plan as noted below.  Plan Bipolar disorder Abilify 30 mg p.o. daily Lamictal 100 mg p.o. daily. Restart lithium at 300 mg p.o. daily. Continue Wellbutrin XL 150 mg p.o. daily. Lithium levels-11/19/2017-0.3-subtherapeutic.  For anxiety disorder Reduce Lexapro to 10 mg p.o. daily Hydroxyzine as needed.  For neuroleptic induced parkinsonism-tremors Discontinue Cogentin. Start amantadine 100 mg p.o. twice daily Aims equals 7  For insomnia Trazodone as needed.  Follow-up in clinic in 2 weeks or sooner if needed.  More than 50 % of the time was spent for psychoeducation and supportive psychotherapy and care coordination.  This note was generated in part or whole with voice recognition software. Voice recognition is usually quite accurate but there are transcription errors that can and very often do occur. I apologize for any typographical errors that were not detected and corrected.          Jomarie Longs, MD 11/29/2017, 12:14 PM

## 2017-12-03 ENCOUNTER — Ambulatory Visit: Admitting: Psychiatry

## 2017-12-10 ENCOUNTER — Ambulatory Visit (INDEPENDENT_AMBULATORY_CARE_PROVIDER_SITE_OTHER): Admitting: Obstetrics & Gynecology

## 2017-12-10 ENCOUNTER — Encounter: Payer: Self-pay | Admitting: Obstetrics & Gynecology

## 2017-12-10 VITALS — BP 126/78 | Ht 64.0 in | Wt 299.0 lb

## 2017-12-10 DIAGNOSIS — Z6841 Body Mass Index (BMI) 40.0 and over, adult: Secondary | ICD-10-CM | POA: Diagnosis not present

## 2017-12-10 DIAGNOSIS — Z01419 Encounter for gynecological examination (general) (routine) without abnormal findings: Secondary | ICD-10-CM

## 2017-12-10 DIAGNOSIS — Z30431 Encounter for routine checking of intrauterine contraceptive device: Secondary | ICD-10-CM | POA: Diagnosis not present

## 2017-12-10 NOTE — Progress Notes (Signed)
Sierra [name] [birth year] [id number]   History:    37 y.o. G5P2A3L2 Separated.  Boyfriend since last year  RP:  Established patient presenting for annual gyn exam   HPI: Well on Paragard IUD.  Stopped Progestin pill 01/2017 because bleeding pattern was worsening on it.  Since then, menses are heavy x 2 days, lasting 5-6 days every 4-6 weeks.  No BTB.  No pelvic pain.  No pain with IC.  STD screen negative for both herself and boyfriend before stopping condom use.  Breasts normal.  Urine/BMs normal. Had Bariatric surgery 03/2016.  BMI decreased from 53.5 to 51.32 this year.  Walking daily.  Low carb nutrition.  Health Labs with Fam MD.  Past medical history,surgical history, family history and social history were all reviewed and documented in the EPIC chart.  Gynecologic History Patient's last menstrual period was 11/25/2017 (lmp unknown). Contraception: Paragard IUD Last Pap: 11/2016. Results were: Negative/HPV HR neg Last mammogram: Never Bone Density: Never Colonoscopy: Never  Obstetric History OB History  Gravida Para Term Preterm AB Living  5 2     2 2   SAB TAB Ectopic Multiple Live Births  2            # Outcome Date GA Lbr Len/2nd Weight Sex Delivery Anes PTL Lv  5 Gravida           4 SAB           3 SAB           2 Para           1 Para              ROS: A ROS was performed and pertinent positives and negatives are included in the history.  GENERAL: No fevers or chills. HEENT: No change in vision, no earache, sore throat or sinus congestion. NECK: No pain or stiffness. CARDIOVASCULAR: No chest pain or pressure. No palpitations. PULMONARY: No shortness of breath, cough or wheeze. GASTROINTESTINAL: No abdominal pain, nausea, vomiting or diarrhea, melena or bright red blood per rectum. GENITOURINARY: No urinary frequency, urgency, hesitancy or dysuria. MUSCULOSKELETAL: No joint or muscle pain, no back pain, no recent trauma. DERMATOLOGIC: No rash, no itching, no lesions.  ENDOCRINE: No polyuria, polydipsia, no heat or cold intolerance. No recent change in weight. HEMATOLOGICAL: No anemia or easy bruising or bleeding. NEUROLOGIC: No headache, seizures, numbness, tingling or weakness. PSYCHIATRIC: No depression, no loss of interest in normal activity or change in sleep pattern.     Exam:   BP 126/78 (BP Location: Right Arm, Patient Position: Sitting, Cuff Size: Large)   Ht 5\' 4"  (1.626 m)   Wt 299 lb (135.6 kg)   LMP 11/25/2017 (LMP Unknown)   BMI 51.32 kg/m   Body mass index is 51.32 kg/m.  General appearance : Well developed well nourished female. No acute distress HEENT: Eyes: no retinal hemorrhage or exudates,  Neck supple, trachea midline, no carotid bruits, no thyroidmegaly Lungs: Clear to auscultation, no rhonchi or wheezes, or rib retractions  Heart: Regular rate and rhythm, no murmurs or gallops Breast:Examined in sitting and supine position were symmetrical in appearance, no palpable masses or tenderness,  no skin retraction, no nipple inversion, no nipple discharge, no skin discoloration, no axillary or supraclavicular lymphadenopathy Abdomen: no palpable masses or tenderness, no rebound or guarding Extremities: no edema or skin discoloration or tenderness  Pelvic: Vulva: Normal  Vagina: No gross lesions or discharge  Cervix: No gross lesions or discharge.  Strings visible.  Pap/HPV HR done.  Uterus  AV, normal size, shape and consistency, non-tender and mobile  Adnexa  Without masses or tenderness  Anus: Normal   Assessment/Plan:  37 y.o. female for annual exam   1. Encounter for routine gynecological examination with Papanicolaou smear of cervix Normal gynecologic exam.  Pap with high-risk HPV done.  Breast exam normal.  2. Encounter for routine checking of intrauterine contraceptive device (IUD) Well on ParaGard IUD.  Flow tends to be on the heavy side, but better without the progestin pill.  Patient will continue to  observe.  IUD strings visible on exam.  3. Class 3 severe obesity due to excess calories without serious comorbidity with body mass index (BMI) of 50.0 to 59.9 in adult [facility](HCC) Recommend to continue on a low calorie/low carb diet such as [employer] diet.  Aerobic physical activities 5 times a week and weightlifting every 2 days.  Genia DelMarie-Lyne Everardo Voris MD, 11:46 AM 12/10/2017

## 2017-12-11 ENCOUNTER — Ambulatory Visit (INDEPENDENT_AMBULATORY_CARE_PROVIDER_SITE_OTHER): Admitting: Psychiatry

## 2017-12-11 ENCOUNTER — Other Ambulatory Visit: Payer: Self-pay

## 2017-12-11 ENCOUNTER — Encounter: Payer: Self-pay | Admitting: Psychiatry

## 2017-12-11 VITALS — BP 136/79 | HR 106 | Temp 98.9°F | Wt 294.2 lb

## 2017-12-11 DIAGNOSIS — F411 Generalized anxiety disorder: Secondary | ICD-10-CM

## 2017-12-11 DIAGNOSIS — T43505A Adverse effect of unspecified antipsychotics and neuroleptics, initial encounter: Secondary | ICD-10-CM

## 2017-12-11 DIAGNOSIS — F3160 Bipolar disorder, current episode mixed, unspecified: Secondary | ICD-10-CM

## 2017-12-11 DIAGNOSIS — F5081 Binge eating disorder: Secondary | ICD-10-CM

## 2017-12-11 DIAGNOSIS — G2111 Neuroleptic induced parkinsonism: Secondary | ICD-10-CM | POA: Diagnosis not present

## 2017-12-11 DIAGNOSIS — F50819 Binge eating disorder, unspecified: Secondary | ICD-10-CM

## 2017-12-11 LAB — PAP, TP IMAGING W/ HPV RNA, RFLX HPV TYPE 16,18/45: HPV DNA High Risk: DETECTED — AB

## 2017-12-11 MED ORDER — AMANTADINE HCL 100 MG PO CAPS: 100.0000 mg | ORAL_CAPSULE | Freq: Two times a day (BID) | ORAL | 0 refills | Status: DC

## 2017-12-11 MED ORDER — ARIPIPRAZOLE 30 MG PO TABS: 30.0000 mg | ORAL_TABLET | Freq: Every day | ORAL | 0 refills | Status: DC

## 2017-12-11 MED ORDER — ESCITALOPRAM OXALATE 5 MG PO TABS: 5.0000 mg | ORAL_TABLET | Freq: Every day | ORAL | 0 refills | Status: DC

## 2017-12-11 NOTE — Progress Notes (Signed)
BH MD OP Progress Note  12/11/2017 2:36 PM Sierra [name]  MRN:  [id number]  Chief Complaint: ' I am here for follow up.' Chief Complaint    Follow-up; Medication Refill     HPI: Sierra [name] is a 37 year old Caucasian female, married, lives in [place], has a history of bipolar disorder, binge eating disorder, anxiety disorder, neuroleptic induced parkinsonism, presented to the clinic today for a follow-up visit.  Patient today reports she is tolerating the lower dose of lithium well.  She denies any nausea or other side effects.  She reports her tremors have improved.  She has very fine tremors occasionally now.  She is compliant with her amantadine .  She reports her mood symptoms are more stable.  She denies any anxiety attacks or crying spells.  She reports she is tolerating the Lexapro at a lower dose of 10 mg.  She denies any significant problems from the dose reduction.  Her moods continues to be stable.  She is concerned sexual side effects and would like to cut down more on the Lexapro.  Hence we will reduce Lexapro to 5 mg and continue to monitor closely  She denies any suicidality.  She denies any perceptual disturbances.  She reports she does not binge on food anymore.  She has been trying to cut down.  She has started exercising and has lost weight.  She looks forward to Thanksgiving holidays which she reports is going to be low key for her.  She reports she is going to babysit her grandchildren and she looks forward to that.  Visit Diagnosis:    ICD-10-CM   1. Bipolar I disorder, most recent episode mixed (HCC) F31.60 ARIPiprazole (ABILIFY) 30 MG tablet  2. GAD (generalized anxiety disorder) F41.1   3. 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 Binge eating disorder F50.81     Past Psychiatric History: I have reviewed past psychiatric history from my progress note on 06/20/2017  Past Medical History:  Past Medical  History:  Diagnosis Date  . Abdominal pain, other specified site   . 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 Anxiety state, unspecified   . Bipolar 1 disorder, depressed (HCC) 05/21/2017  . BIPOLAR AFFECTIVE DISORDER 12/23/2006   Qualifier: Diagnosis of  By: Ermalene SearingBedsole MD, Amy    . Bipolar affective disorder (HCC) 10/12/2011  . Bipolar disorder, unspecified (HCC)   . Depressive disorder, not elsewhere classified   . Edema   . Esophageal reflux   . Fibromyalgia   . Headache   . History of kidney stones   . Mild or unspecified pre-eclampsia, unspecified as to episode of care   . Morbid obesity (HCC)   . Myalgia and myositis, unspecified   . Other dyspnea and respiratory abnormality   . Preeclampsia    with first pregnancy  . Raynaud's disease   . Urinary tract infection, site not specified     Past Surgical History:  Procedure Laterality Date  . CESAREAN SECTION     X 2  . CHOLECYSTECTOMY  03/2006  . CYSTOSCOPY W/ URETERAL STENT PLACEMENT Left 05/14/2016   Procedure: CYSTOSCOPY WITH STENT REPLACEMENT;  Surgeon: Vanna ScotlandAshley Brandon, MD;  Location: [facility];  Service: Urology;  Laterality: Left;  . CYSTOSCOPY WITH STENT PLACEMENT Left 04/25/2016   Procedure: CYSTOSCOPY WITH STENT PLACEMENT;  Surgeon: Vanna ScotlandAshley Brandon, MD;  Location: [facility];  Service: Urology;  Laterality: Left;  . DILATION AND CURETTAGE OF UTERUS    . IVC FILTER INSERTION  03/26/2016   [facility]  . LAPAROSCOPIC GASTRIC RESTRICTIVE DUODENAL PROCEDURE (DUODENAL SWITCH)  03/2016  . LITHOTRIPSY    . TONSILLECTOMY AND ADENOIDECTOMY    . URETEROSCOPY WITH HOLMIUM LASER LITHOTRIPSY Left 05/14/2016   Procedure: URETEROSCOPY WITH HOLMIUM LASER LITHOTRIPSY;  Surgeon: [name], MD;  Location: [facility];  Service: Urology;  Laterality: Left;    Family Psychiatric History: Reviewed family psychiatric history from my progress note on 06/20/2017  Family History:  Family History  Problem Relation Age of Onset  . Depression  Father   . Alcohol abuse Father   . Depression Mother   . Hyperlipidemia Mother   . Sleep apnea Mother   . Depression Sister   . Hyperlipidemia Brother   . Depression Brother   . Hyperlipidemia Brother   . Depression Brother   . Alcohol abuse Brother   . Coronary artery disease Maternal Grandmother   . Heart attack Maternal Grandmother   . Diabetes Maternal Grandmother   . Lung cancer Maternal Grandfather   . Emphysema Maternal Grandfather   . Coronary artery disease Maternal Grandfather   . Lupus Unknown        Aunt  . Fibromyalgia Unknown        Aunt    Social History: I have reviewed social history from my progress note on 06/20/2017. Social History   Socioeconomic History  . Marital status: Married    Spouse name: brian  . Number of children: 2  . Years of education: Not on file  . Highest education level: Some college, no degree  Occupational History  . Occupation: Arts development officer  Social Needs  . Financial resource strain: Not hard at all  . Food insecurity:    Worry: Never true    Inability: Never true  . Transportation needs:    Medical: No    Non-medical: No  Tobacco Use  . Smoking status: Never Smoker  . Smokeless tobacco: Never Used  Substance and Sexual Activity  . Alcohol use: Yes    Alcohol/week: 2.0 - 3.0 standard drinks    Types: 2 Glasses of wine per week  . Drug use: No  . Sexual activity: Yes    Partners: Male    Birth control/protection: None, IUD  Lifestyle  . Physical activity:    Days per week: 0 days    Minutes per session: 0 min  . Stress: To some extent  Relationships  . Social connections:    Talks on phone: More than three times a week    Gets together: Once a week    Attends religious service: Never    Active member of club or organization: No    Attends meetings of clubs or organizations: Never    Relationship status: Married  Other Topics Concern  . Not on file  Social History Narrative   1 child, 2 step-sons      Regular  exercise-no   Diet: no fast food, likes sweets    Allergies:  Allergies  Allergen Reactions  . Molds & Smuts   . Pollen Extract   . Uncaria Tomentosa (Cats Claw)   . Milk-Related Compounds     Able to tolerate yogurt; mild intolerance to milk/cheese  . Pineapple   . Soy Allergy   . Strawberry Extract     Break out on tongue noted    Metabolic Disorder Labs: Lab Results  Component Value Date   HGBA1C 4.7 (L) 05/22/2017   MPG 88.19 05/22/2017   No results found  for: PROLACTIN Lab Results  Component Value Date   CHOL 105 05/22/2017   TRIG 41 05/22/2017   HDL 48 05/22/2017   CHOLHDL 2.2 05/22/2017   VLDL 8 05/22/2017   LDLCALC 49 05/22/2017   LDLCALC 99 09/11/2013   Lab Results  Component Value Date   TSH 0.677 05/22/2017   TSH 3.71 10/01/2016    Therapeutic Level Labs: Lab Results  Component Value Date   LITHIUM 0.3 (L) 11/26/2017   LITHIUM 0.3 (L) 11/18/2017   No results found for: VALPROATE No components found for:  CBMZ  Current Medications: Current Outpatient Medications  Medication Sig Dispense Refill  . amantadine (SYMMETREL) 100 MG capsule Take 1 capsule (100 mg total) by mouth 2 (two) times daily. 180 capsule 0  . ARIPiprazole (ABILIFY) 30 MG tablet Take 1 tablet (30 mg total) by mouth daily. 90 tablet 0  . Biotin 5 MG TBDP Take by mouth.    Marland Kitchen buPROPion (WELLBUTRIN XL) 150 MG 24 hr tablet Take 1 tablet (150 mg total) by mouth daily. 90 tablet 1  . calcium citrate-vitamin D 500-400 MG-UNIT chewable tablet Chew 1 tablet by mouth daily.     . Cholecalciferol (VITAMIN D3) 125 MCG (5000 UT) CAPS Take 5,000 Units by mouth daily.  2  . hydrOXYzine (ATARAX/VISTARIL) 25 MG tablet Take 1 tablet (25 mg total) by mouth daily as needed for anxiety. 90 tablet 1  . Lactobacillus Rhamnosus, GG, (CULTURELLE) CAPS Take by mouth.    . lamoTRIgine (LAMICTAL) 100 MG tablet Take 1 tablet (100 mg total) by mouth daily. 90 tablet 1  . lithium carbonate (LITHOBID) 300 MG CR  tablet Take 1 tablet (300 mg total) by mouth daily with supper. Mood symptoms 90 tablet 0  . montelukast (SINGULAIR) 10 MG tablet Take 10 mg by mouth daily.    . Multiple Vitamin (MULTIVITAMIN) tablet Take 1 tablet by mouth daily.    Marland Kitchen NIFEdipine (PROCARDIA-XL/ADALAT CC) 60 MG 24 hr tablet Take 60 mg by mouth daily.     Marland Kitchen omeprazole (PRILOSEC) 20 MG capsule Take 20 mg by mouth daily.    Marland Kitchen PARAGARD INTRAUTERINE COPPER IUD IUD by Intrauterine route.    Marland Kitchen Specialty Vitamins Products (BIOTIN PLUS KERATIN PO) Take 1 tablet by mouth daily.    . traMADol (ULTRAM) 50 MG tablet tramadol 50 mg tablet  Take 1 tablet every 6 hours by oral route.    . traZODone (DESYREL) 100 MG tablet Take 1 tablet (100 mg total) by mouth at bedtime as needed for sleep. 90 tablet 1  . escitalopram (LEXAPRO) 5 MG tablet Take 1 tablet (5 mg total) by mouth daily. Pt has supplies 30 tablet 0   No current facility-administered medications for this visit.      Musculoskeletal: Strength & Muscle Tone: within normal limits Gait & Station: normal Patient leans: N/A  Psychiatric Specialty Exam: Review of Systems  Psychiatric/Behavioral: The patient is nervous/anxious (improved).   All other systems reviewed and are negative.   Blood pressure 136/79, pulse (!) 106, temperature 98.9 F (37.2 C), temperature source Oral, weight 294 lb 3.2 oz (133.4 kg), last menstrual period 11/25/2017.Body mass index is 50.5 kg/m.  General Appearance: Casual  Eye Contact:  Fair  Speech:  Clear and Coherent  Volume:  Normal  Mood:  Euthymic  Affect:  Congruent  Thought Process:  Goal Directed and Descriptions of Associations: Intact  Orientation:  Full (Time, Place, and Person)  Thought Content: Logical   Suicidal Thoughts:  No  Homicidal Thoughts:  No  Memory:  Immediate;   Fair Recent;   Fair Remote;   Fair  Judgement:  Fair  Insight:  Fair  Psychomotor Activity:  Normal  Concentration:  Concentration: Fair and Attention Span:  Fair  Recall:  [employer] of Knowledge: Fair  Language: Fair  Akathisia:  No  Handed:  Right  AIMS (if indicated): 1- improved  Assets:  Communication Skills Desire for Improvement Social Support  ADL's:  Intact  Cognition: WNL  Sleep:  improved   Screenings: AIMS     Office Visit from 12/11/2017 in [facility] Office Visit from 11/29/2017 in [facility] Office Visit from 11/20/2017 in [facility] Admission (Discharged) from 05/21/2017 in [facility]  AIMS Total Score  1  7  5   0    AUDIT     Admission (Discharged) from 05/21/2017 in [facility]  Alcohol Use Disorder Identification Test Final Score (AUDIT)  6       Assessment and Plan: Japleen is a 37 year old Caucasian female who has a history of bipolar disorder, anxiety disorder, binge eating disorder, presented to the clinic today for a follow-up visit.  Patient with history of multiple inpatient mental health admissions in the past.  Patient is currently making progress on the lithium and Abilify.  She denies any significant side effects.  Will continue plan as noted below.  Plan Bipolar disorder Continue Abilify 30 mg p.o. daily Lamictal 100 mg p.o. daily Lithium 300 mg p.o. daily Reduce Lexapro to 5 mg p.o. Daily. Lithium level-11/19/2017-0.3-subtherapeutic.  However when the dosage was increased patient developed tremors.  Hence we will continue the same dosage at this time.  For anxiety disorder Reduce Lexapro to 5 mg p.o. daily Hydroxyzine as needed  For neuroleptic induced parkinsonism-tremors Continue amantadine 100 mg p.o. twice daily AIMS improved - 1  For insomnia Trazodone as needed.  Follow-up in clinic in 4 weeks or sooner if needed.  More than 50 % of the time was spent for psychoeducation and supportive psychotherapy and care coordination.  This note was generated in  part or whole with voice recognition software. Voice recognition is usually quite accurate but there are transcription errors that can and very often do occur. I apologize for any typographical errors that were not detected and corrected.       Jomarie Longs, MD 12/11/2017, 2:36 PM

## 2017-12-11 NOTE — Patient Instructions (Signed)
Start taking Lexapro 5 mg daily.

## 2017-12-13 ENCOUNTER — Encounter: Payer: Self-pay | Admitting: Obstetrics & Gynecology

## 2017-12-13 NOTE — Patient Instructions (Signed)
1. Encounter for routine gynecological examination with Papanicolaou smear of cervix Normal gynecologic exam.  Pap with high-risk HPV done.  Breast exam normal.  2. Encounter for routine checking of intrauterine contraceptive device (IUD) Well on ParaGard IUD.  Flow tends to be on the heavy side, but better without the progestin pill.  Patient will continue to observe.  IUD strings visible on exam.  3. Class 3 severe obesity due to excess calories without serious comorbidity with body mass index (BMI) of 50.0 to 59.9 in [facility](HCC) Recommend to continue on a low calorie/low carb diet such as [employer] diet.  Aerobic physical activities 5 times a week and weightlifting every 2 days.  Trula [name], it was a pleasure seeing you today!  I will inform you of your results as soon as they are available.

## 2018-01-13 ENCOUNTER — Other Ambulatory Visit: Payer: Self-pay

## 2018-01-13 ENCOUNTER — Ambulatory Visit (INDEPENDENT_AMBULATORY_CARE_PROVIDER_SITE_OTHER): Admitting: Psychiatry

## 2018-01-13 ENCOUNTER — Encounter: Payer: Self-pay | Admitting: Psychiatry

## 2018-01-13 VITALS — BP 117/74 | HR 73 | Temp 98.7°F | Wt 299.0 lb

## 2018-01-13 DIAGNOSIS — F5081 Binge eating disorder: Secondary | ICD-10-CM | POA: Diagnosis not present

## 2018-01-13 DIAGNOSIS — F50819 Binge eating disorder, unspecified: Secondary | ICD-10-CM

## 2018-01-13 DIAGNOSIS — F411 Generalized anxiety disorder: Secondary | ICD-10-CM | POA: Diagnosis not present

## 2018-01-13 DIAGNOSIS — F3160 Bipolar disorder, current episode mixed, unspecified: Secondary | ICD-10-CM

## 2018-01-13 DIAGNOSIS — G2111 Neuroleptic induced parkinsonism: Secondary | ICD-10-CM | POA: Diagnosis not present

## 2018-01-13 MED ORDER — ESCITALOPRAM OXALATE 5 MG PO TABS: 5.0000 mg | ORAL_TABLET | Freq: Every day | ORAL | 0 refills | Status: DC

## 2018-01-13 MED ORDER — LITHIUM CARBONATE ER 300 MG PO TBCR: 300.0000 mg | EXTENDED_RELEASE_TABLET | Freq: Every day | ORAL | 0 refills | Status: DC

## 2018-01-13 NOTE — Progress Notes (Signed)
BH MD OP Progress Note  01/13/2018 12:47 PM [name] Hartel  MRN:  [id number]  Chief Complaint: ' I am here for follow up." Chief Complaint    Follow-up; Medication Refill     HPI: Sierra [name] is a 37 year old Caucasian female, married, lives in [place], has a history of bipolar disorder, binge eating disorder, anxiety disorder, neuroleptic induced parkinsonism, presented to the clinic today for a follow-up visit.  Patient today reports her mood symptoms is improving.  She denies any significant anxiety symptoms or depressive symptoms.   She denies any significant tremors and reports her tremors of her bilateral hands have improved.  She is on amantadine as prescribed.  She however does report some nightmares at night and does not know if the medications are causing it or not.  Discussed with patient to reduce the dosage of amantadine since it can cause abnormal dreams as a side effect.  She will reduce the dosage herself and monitor herself.  Patient is currently on a lower dose of Lexapro and would like to be taken off of it.  Discussed with patient to stop taking the Lexapro and monitor her anxiety symptoms closely.  If her anxiety symptoms get worse in the next few days she will restart the low-dose of 5 mg.  Patient denies any suicidality.  She denies any perceptual disturbances.  She denies any eating disorder problems or binging on food at this time.  She continues to be in therapy with therapist Berniece AndreasLydia Long which is going well.  She sees her once every week.   Visit Diagnosis:    ICD-10-CM   1. Bipolar I disorder, most recent episode mixed (HCC) F31.60 lithium carbonate (LITHOBID) 300 MG CR tablet  2. GAD (generalized anxiety disorder) F41.1 escitalopram (LEXAPRO) 5 MG tablet  3. 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 Binge eating disorder F50.81     Past Psychiatric History: Reviewed past psychiatric history from my progress note on 06/20/2017  Past Medical  History:  Past Medical History:  Diagnosis Date  . Abdominal pain, other specified site   . 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 Anxiety state, unspecified   . Bipolar 1 disorder, depressed (HCC) 05/21/2017  . BIPOLAR AFFECTIVE DISORDER 12/23/2006   Qualifier: Diagnosis of  By: Ermalene SearingBedsole MD, Amy    . Bipolar affective disorder (HCC) 10/12/2011  . Bipolar disorder, unspecified (HCC)   . Depressive disorder, not elsewhere classified   . Edema   . Esophageal reflux   . Fibromyalgia   . Headache   . History of kidney stones   . Mild or unspecified pre-eclampsia, unspecified as to episode of care   . Morbid obesity (HCC)   . Myalgia and myositis, unspecified   . Other dyspnea and respiratory abnormality   . Preeclampsia    with first pregnancy  . Raynaud's disease   . Urinary tract infection, site not specified     Past Surgical History:  Procedure Laterality Date  . CESAREAN SECTION     X 2  . CHOLECYSTECTOMY  03/2006  . CYSTOSCOPY W/ URETERAL STENT PLACEMENT Left 05/14/2016   Procedure: CYSTOSCOPY WITH STENT REPLACEMENT;  Surgeon: Vanna ScotlandAshley Brandon, MD;  Location: [facility];  Service: Urology;  Laterality: Left;  . CYSTOSCOPY WITH STENT PLACEMENT Left 04/25/2016   Procedure: CYSTOSCOPY WITH STENT PLACEMENT;  Surgeon: Vanna ScotlandAshley Brandon, MD;  Location: [facility];  Service: Urology;  Laterality: Left;  . DILATION AND CURETTAGE OF UTERUS    . IVC FILTER INSERTION  03/26/2016  [facility]  . LAPAROSCOPIC GASTRIC RESTRICTIVE DUODENAL PROCEDURE (DUODENAL SWITCH)  03/2016  . LITHOTRIPSY    . TONSILLECTOMY AND ADENOIDECTOMY    . URETEROSCOPY WITH HOLMIUM LASER LITHOTRIPSY Left 05/14/2016   Procedure: URETEROSCOPY WITH HOLMIUM LASER LITHOTRIPSY;  Surgeon: [name], MD;  Location: [facility];  Service: Urology;  Laterality: Left;    Family Psychiatric History: Reviewed family psychiatric history from my progress note on 06/20/2017.  Family History:  Family History  Problem Relation Age  of Onset  . Depression Father   . Alcohol abuse Father   . Depression Mother   . Hyperlipidemia Mother   . Sleep apnea Mother   . Depression Sister   . Hyperlipidemia Brother   . Depression Brother   . Hyperlipidemia Brother   . Depression Brother   . Alcohol abuse Brother   . Coronary artery disease Maternal Grandmother   . Heart attack Maternal Grandmother   . Diabetes Maternal Grandmother   . Lung cancer Maternal Grandfather   . Emphysema Maternal Grandfather   . Coronary artery disease Maternal Grandfather   . Lupus Unknown        Aunt  . Fibromyalgia Unknown        Aunt    Social History: I have reviewed social history from my progress note on 06/20/2017 Social History   Socioeconomic History  . Marital status: Married    Spouse name: brian  . Number of children: 2  . Years of education: Not on file  . Highest education level: Some college, no degree  Occupational History  . Occupation: Arts development officer  Social Needs  . Financial resource strain: Not hard at all  . Food insecurity:    Worry: Never true    Inability: Never true  . Transportation needs:    Medical: No    Non-medical: No  Tobacco Use  . Smoking status: Never Smoker  . Smokeless tobacco: Never Used  Substance and Sexual Activity  . Alcohol use: Yes    Alcohol/week: 2.0 - 3.0 standard drinks    Types: 2 Glasses of wine per week  . Drug use: No  . Sexual activity: Yes    Partners: Male    Birth control/protection: None, I.U.D.  Lifestyle  . Physical activity:    Days per week: 0 days    Minutes per session: 0 min  . Stress: To some extent  Relationships  . Social connections:    Talks on phone: More than three times a week    Gets together: Once a week    Attends religious service: Never    Active member of club or organization: No    Attends meetings of clubs or organizations: Never    Relationship status: Married  Other Topics Concern  . Not on file  Social History Narrative   1 child, 2  step-sons      Regular exercise-no   Diet: no fast food, likes sweets    Allergies:  Allergies  Allergen Reactions  . Molds & Smuts   . Pollen Extract   . Uncaria Tomentosa (Cats Claw)   . Milk-Related Compounds     Able to tolerate yogurt; mild intolerance to milk/cheese  . Pineapple   . Soy Allergy   . Strawberry Extract     Break out on tongue noted    Metabolic Disorder Labs: Lab Results  Component Value Date   HGBA1C 4.7 (L) 05/22/2017   MPG 88.19 05/22/2017   No results found for: PROLACTIN Lab  Results  Component Value Date   CHOL 105 05/22/2017   TRIG 41 05/22/2017   HDL 48 05/22/2017   CHOLHDL 2.2 05/22/2017   VLDL 8 05/22/2017   LDLCALC 49 05/22/2017   LDLCALC 99 09/11/2013   Lab Results  Component Value Date   TSH 0.677 05/22/2017   TSH 3.71 10/01/2016    Therapeutic Level Labs: Lab Results  Component Value Date   LITHIUM 0.3 (L) 11/26/2017   LITHIUM 0.3 (L) 11/18/2017   No results found for: VALPROATE No components found for:  CBMZ  Current Medications: Current Outpatient Medications  Medication Sig Dispense Refill  . amantadine (SYMMETREL) 100 MG capsule Take 1 capsule (100 mg total) by mouth 2 (two) times daily. 180 capsule 0  . ARIPiprazole (ABILIFY) 30 MG tablet Take 1 tablet (30 mg total) by mouth daily. 90 tablet 0  . Biotin 5 MG TBDP Take by mouth.    Marland Kitchen buPROPion (WELLBUTRIN XL) 150 MG 24 hr tablet Take 1 tablet (150 mg total) by mouth daily. 90 tablet 1  . calcium citrate-vitamin D 500-400 MG-UNIT chewable tablet Chew 1 tablet by mouth daily.     . Cholecalciferol (VITAMIN D3) 125 MCG (5000 UT) CAPS Take 5,000 Units by mouth daily.  2  . escitalopram (LEXAPRO) 5 MG tablet Take 1 tablet (5 mg total) by mouth daily. 30 tablet 0  . hydrOXYzine (ATARAX/VISTARIL) 25 MG tablet Take 1 tablet (25 mg total) by mouth daily as needed for anxiety. 90 tablet 1  . Lactobacillus Rhamnosus, GG, (CULTURELLE) CAPS Take by mouth.    . lamoTRIgine  (LAMICTAL) 100 MG tablet Take 1 tablet (100 mg total) by mouth daily. 90 tablet 1  . lithium carbonate (LITHOBID) 300 MG CR tablet Take 1 tablet (300 mg total) by mouth daily with supper. Mood symptoms 90 tablet 0  . montelukast (SINGULAIR) 10 MG tablet Take 10 mg by mouth daily.    . Multiple Vitamin (MULTIVITAMIN) tablet Take 1 tablet by mouth daily.    Marland Kitchen NIFEdipine (PROCARDIA-XL/ADALAT CC) 60 MG 24 hr tablet Take 60 mg by mouth daily.     Marland Kitchen omeprazole (PRILOSEC) 20 MG capsule Take 20 mg by mouth daily.    Marland Kitchen PARAGARD INTRAUTERINE COPPER IUD IUD by Intrauterine route.    Marland Kitchen Specialty Vitamins Products (BIOTIN PLUS KERATIN PO) Take 1 tablet by mouth daily.    . traMADol (ULTRAM) 50 MG tablet tramadol 50 mg tablet  Take 1 tablet every 6 hours by oral route.    . traZODone (DESYREL) 100 MG tablet Take 1 tablet (100 mg total) by mouth at bedtime as needed for sleep. 90 tablet 1   No current facility-administered medications for this visit.      Musculoskeletal: Strength & Muscle Tone: within normal limits Gait & Station: normal Patient leans: N/A  Psychiatric Specialty Exam: Review of Systems  Psychiatric/Behavioral: The patient is nervous/anxious and has insomnia.   All other systems reviewed and are negative.   Blood pressure 117/74, pulse 73, temperature 98.7 F (37.1 C), temperature source Oral, weight 299 lb (135.6 kg).Body mass index is 51.32 kg/m.  General Appearance: Casual  Eye Contact:  Fair  Speech:  Clear and Coherent  Volume:  Normal  Mood:  Anxious  Affect:  Congruent  Thought Process:  Goal Directed and Descriptions of Associations: Intact  Orientation:  Full (Time, Place, and Person)  Thought Content: Logical   Suicidal Thoughts:  No  Homicidal Thoughts:  No  Memory:  Immediate;  Fair Recent;   Fair Remote;   Fair  Judgement:  Fair  Insight:  Fair  Psychomotor Activity:  Normal  Concentration:  Concentration: Fair and Attention Span: Fair  Recall:  [employer]akFair   Fund of Knowledge: Fair  Language: Fair  Akathisia:  No  Handed:  Right  AIMS (if indicated): mild tremors - chronic  Assets:  Communication Skills Desire for Improvement Social Support  ADL's:  Intact  Cognition: WNL  Sleep:  nightmares   Screenings: AIMS     [facility] Visit from [facility]11/2017 [facility] Office Visit from 11/29/2017 [facility] Office Visit [facility]2017 [facility] Admission (Discharged) from [facility]  AIMS Total Score  1  7  5   0    AUDIT     Admission [facility]Discharged) [facility]  Alcohol Use Disorder Identification Test Final Score (AUDIT)  6       Assessment and Plan: Sierra [name] is a 37 year old Caucasian female who has a history of bipolar disorder, anxiety disorder, binge eating disorder, presented to the clinic today for a follow-up visit.  Patient with history of multiple inpatient mental health admissions in the past.  She is currently making progress on the current medication regimen as well as psychotherapy visits.  She will continue psychotherapy.  Plan as noted below.  Plan Bipolar disorder Continue Abilify 30 mg p.o. daily Lamictal 100 mg p.o. daily Lithium 300 mg p.o. daily Discontinue Lexapro. Lithium level-11/19/2017-0.3.  Subtherapeutic.  However when the dosage was increased patient developed tremors. Wellbutrin XL 150 mg po daily.  For anxiety disorder Hydroxyzine as needed.  Neuroleptic induced parkinsonism-tremors Her tremors have improved. Patient advised to reduce amantadine to once daily-100 mg.  Since she is reporting some nightmares which could be a possible side effect of amantadine.  She will monitor herself.  For insomnia Trazodone as needed-she rarely uses it.  Binge eating In remission  Follow-up in clinic in 1 month or sooner if needed.  More than 50 %  of the time was spent for psychoeducation and supportive psychotherapy and care coordination.  This note was generated in part or whole with voice recognition software. Voice recognition is usually quite accurate but there are transcription errors that can and very often do occur. I apologize for any typographical errors that were not detected and corrected.   Jomarie Longs'    Latha Staunton, MD 01/13/2018, 12:47 PM

## 2018-01-14 ENCOUNTER — Encounter: Payer: Self-pay | Admitting: Obstetrics & Gynecology

## 2018-01-14 ENCOUNTER — Ambulatory Visit (INDEPENDENT_AMBULATORY_CARE_PROVIDER_SITE_OTHER): Admitting: Obstetrics & Gynecology

## 2018-01-14 VITALS — BP 126/84

## 2018-01-14 DIAGNOSIS — R87810 Cervical high risk human papillomavirus (HPV) DNA test positive: Secondary | ICD-10-CM

## 2018-01-14 DIAGNOSIS — R87610 Atypical squamous cells of undetermined significance on cytologic smear of cervix (ASC-US): Secondary | ICD-10-CM | POA: Diagnosis not present

## 2018-01-14 NOTE — Patient Instructions (Signed)
1. ASCUS with positive high risk HPV cervical First abnormal Pap test with ASCUS and positive high risk HPV.  Colposcopy done today showing probable mild to moderate dysplasia.  Pending cervical biopsies.  HPV 16/18/45 done.  Counseling done on HPV, colposcopy procedure and management per results.  Colposcopy findings reviewed with patient.  Procedure well-tolerated.  Management per pathology results.  LEEP procedure discussed.  Sierra [name], it was a pleasure seeing you today!  I will inform you of your results as soon as they are available.   Colposcopy, Care After This sheet gives you information about how to care for yourself after your procedure. Your health care provider may also give you more specific instructions. If you have problems or questions, contact your health care provider. What can I expect after the procedure? If you had a colposcopy without a biopsy, you can expect to feel fine right away, but you may have some spotting for a few days. You can go back to your normal activities. If you had a colposcopy with a biopsy, it is common to have:  Soreness and pain. This may last for a few days.  Light-headedness.  Mild vaginal bleeding or dark-colored, grainy discharge. This may last for a few days. The discharge may be due to a solution that was used during the procedure. You may need to wear a sanitary pad during this time.  Spotting for at least 48 hours after the procedure. Follow these instructions at home:   Take over-the-counter and prescription medicines only as told by your health care provider. Talk with your health care provider about what type of over-the-counter pain medicine and prescription medicine you can start taking again. It is especially important to talk with your health care provider if you take blood-thinning medicine.  Do not drive or use heavy machinery while taking prescription pain medicine.  For at least 3 days after your procedure, or as long as told  by your health care provider, avoid: ? Douching. ? Using tampons. ? Having sexual intercourse.  Continue to use birth control (contraception).  Limit your physical activity for the first day after the procedure as told by your health care provider. Ask your health care provider what activities are safe for you.  It is up to you to get the results of your procedure. Ask your health care provider, or the department performing the procedure, when your results will be ready.  Keep all follow-up visits as told by your health care provider. This is important. Contact a health care provider if:  You develop a skin rash. Get help right away if:  You are bleeding heavily from your vagina or you are passing blood clots. This includes using more than one sanitary pad per hour for 2 hours in a row.  You have a fever or chills.  You have pelvic pain.  You have abnormal, yellow-colored, or bad-smelling vaginal discharge. This could be a sign of infection.  You have severe pain or cramps in your lower abdomen that do not get better with medicine.  You feel light-headed or dizzy, or you faint. Summary  If you had a colposcopy without a biopsy, you can expect to feel fine right away, but you may have some spotting for a few days. You can go back to your normal activities.  If you had a colposcopy with a biopsy, you may notice mild pain and spotting for 48 hours after the procedure.  Avoid douching, using tampons, and having sexual intercourse for 3  days after the procedure or as long as told by your health care provider.  Contact your health care provider if you have bleeding, severe pain, or signs of infection. This information is not intended to replace advice given to you by your health care provider. Make sure you discuss any questions you have with your health care provider. Document Released: 10/22/2012 Document Revised: 08/19/2015 Document Reviewed: 08/19/2015 Elsevier Interactive  Patient Education  2019 [employer]

## 2018-01-14 NOTE — Progress Notes (Signed)
    Sierra [name] 1980/07/02 [id number]        37 y.o.  [id number]  Boyfriend x 07/2017  RP: ASCUS/HPV HR pos for Colposcopy  HPI: Previous Pap neg/HPV HR neg 11/2016.  Pap 12/10/2017 ASCUS/HPV HR pos.  STI screen neg 07/22/2017.  Declines repeat STI screen.  Well on Paragard IUD.  No pelvic pain.  Normal vaginal secretions.  No fever.   OB History  Gravida Para Term Preterm AB Living  5 2     2 2   SAB TAB Ectopic Multiple Live Births  2            # Outcome Date GA Lbr Len/2nd Weight Sex Delivery Anes PTL Lv  5 Gravida           4 SAB           3 SAB           2 Para           1 Para             Past medical history,surgical history, problem list, medications, allergies, family history and social history were all reviewed and documented in the EPIC chart.   Directed ROS with pertinent positives and negatives documented in the history of present illness/assessment and plan.  Exam:  Vitals:   01/14/18 1142  BP: 126/84   General appearance:  Normal  Colposcopy Procedure Note Sierra [name] 01/14/2018  Indications: ASCUS/HPV HR pos  Procedure Details  The risks and benefits of the procedure and Verbal informed consent obtained.  Speculum placed in vagina and excellent visualization of cervix achieved, cervix swabbed x 3 with acetic acid solution.  Findings:  Cervix colposcopy: IUD strings visible Physical Exam Genitourinary:       Vaginal colposcopy: Normal  Vulvar colposcopy: Grossly normal  Perirectal colposcopy: Grossly normal  The cervix was sprayed with Hurricane before performing the cervical biopsies.  Specimens: HPV 16-18-45.  Cervical Bx 6, 10 and 1 O'clock  Complications:  None, good hemostasis with Silver Nitrate . Plan:  Management per results    Assessment/Plan:  37 y.o. [id number]   1. ASCUS with positive high risk HPV cervical First abnormal Pap test with ASCUS and positive high risk HPV.  Colposcopy done today showing probable  mild to moderate dysplasia.  Pending cervical biopsies.  HPV 16/18/45 done.  Counseling done on HPV, colposcopy procedure and management per results.  Colposcopy findings reviewed with patient.  Procedure well-tolerated.  Management per pathology results.  LEEP procedure discussed.  Counseling on above issues and coordination of care more than 50% for 15 minutes.  Genia DelMarie-Lyne Marticia Reifschneider MD, 11:58 AM 01/14/2018

## 2018-01-16 LAB — TISSUE PATH REPORT 10802

## 2018-01-16 LAB — PATHOLOGY

## 2018-01-16 LAB — HPV TYPE 16 AND 18/45 RNA
HPV Type 16 RNA: DETECTED — AB
HPV Type 18/45 RNA: NOT DETECTED

## 2018-02-04 ENCOUNTER — Other Ambulatory Visit: Payer: Self-pay | Admitting: Psychiatry

## 2018-02-04 DIAGNOSIS — F411 Generalized anxiety disorder: Secondary | ICD-10-CM

## 2018-02-13 ENCOUNTER — Other Ambulatory Visit: Payer: Self-pay

## 2018-02-13 ENCOUNTER — Encounter: Payer: Self-pay | Admitting: Psychiatry

## 2018-02-13 ENCOUNTER — Ambulatory Visit (INDEPENDENT_AMBULATORY_CARE_PROVIDER_SITE_OTHER): Admitting: Psychiatry

## 2018-02-13 VITALS — BP 118/75 | HR 56 | Temp 98.3°F | Wt 304.4 lb

## 2018-02-13 DIAGNOSIS — G2111 Neuroleptic induced parkinsonism: Secondary | ICD-10-CM

## 2018-02-13 DIAGNOSIS — F5081 Binge eating disorder: Secondary | ICD-10-CM

## 2018-02-13 DIAGNOSIS — F411 Generalized anxiety disorder: Secondary | ICD-10-CM

## 2018-02-13 DIAGNOSIS — F3160 Bipolar disorder, current episode mixed, unspecified: Secondary | ICD-10-CM

## 2018-02-13 MED ORDER — LAMOTRIGINE 100 MG PO TABS: 100.0000 mg | ORAL_TABLET | Freq: Every day | ORAL | 1 refills | Status: DC

## 2018-02-13 MED ORDER — HYDROXYZINE HCL 25 MG PO TABS: 25.0000 mg | ORAL_TABLET | Freq: Every day | ORAL | 1 refills | Status: DC | PRN

## 2018-02-13 NOTE — Progress Notes (Signed)
BH MD OP Progress Note  02/13/2018 1:45 PM Sierra [name]  MRN:  [id number]  Chief Complaint: ' I am here for follow up." Chief Complaint    Follow-up; Medication Refill     HPI: Sierra [name] is a [age] year old Caucasian female, married, lives in [place], has a history of bipolar disorder, binge eating disorder, anxiety disorder, neuroleptic induced parkinsonism, presented to clinic today for a follow-up visit.     Patient today reports that she has been having some situational anxiety.  She reports her son and girlfriend currently lives with her with their 3 little children.  Patient reports it as sometimes hard dealing with all the chaos in the house.  Patient reports she has been coping with it by getting out more, going for walks and so on.  She reports she has been taking her hydroxyzine as needed which has been helpful.  She has been sleeping well.  She denies any significant depressive symptoms.  She denies any suicidality, homicidality or perceptual disturbances.  She denies any binge eating and her reports medications is effective.  Patient is due for lithium labs and hence will give her a lab slip to get labs done in March.  She had last done it in November.  Patient to continue psychotherapy sessions which are beneficial. Visit Diagnosis:    ICD-10-CM   1. Bipolar I disorder, most recent episode mixed (HCC) F31.60 lamoTRIgine (LAMICTAL) 100 MG tablet  2. GAD (generalized anxiety disorder) F41.1 hydrOXYzine (ATARAX/VISTARIL) 25 MG tablet  3. 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 Binge eating disorder F50.81     Past Psychiatric History: Reviewed past psychiatric history from my progress note on 06/20/2017  Past Medical History:  Past Medical History:  Diagnosis Date  . Abdominal pain, other specified site   . 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 Anxiety state, unspecified   . Bipolar 1 disorder, depressed (HCC) 05/21/2017  . BIPOLAR AFFECTIVE  DISORDER 12/23/2006   Qualifier: Diagnosis of  By: Ermalene SearingBedsole MD, Amy    . Bipolar affective disorder (HCC) 10/12/2011  . Bipolar disorder, unspecified (HCC)   . Depressive disorder, not elsewhere classified   . Edema   . Esophageal reflux   . Fibromyalgia   . Headache   . History of kidney stones   . Mild or unspecified pre-eclampsia, unspecified as to episode of care   . Morbid obesity (HCC)   . Myalgia and myositis, unspecified   . Other dyspnea and respiratory abnormality   . Preeclampsia    with first pregnancy  . Raynaud's disease   . Urinary tract infection, site not specified     Past Surgical History:  Procedure Laterality Date  . CESAREAN SECTION     X 2  . CHOLECYSTECTOMY  03/2006  . CYSTOSCOPY W/ URETERAL STENT PLACEMENT Left 05/14/2016   Procedure: CYSTOSCOPY WITH STENT REPLACEMENT;  Surgeon: Vanna ScotlandAshley Brandon, MD;  Location: [facility];  Service: Urology;  Laterality: Left;  . CYSTOSCOPY WITH STENT PLACEMENT Left 04/25/2016   Procedure: CYSTOSCOPY WITH STENT PLACEMENT;  Surgeon: Vanna ScotlandAshley Brandon, MD;  Location: [facility];  Service: Urology;  Laterality: Left;  . DILATION AND CURETTAGE OF UTERUS    . IVC FILTER INSERTION  03/26/2016   [facility]  . LAPAROSCOPIC GASTRIC RESTRICTIVE DUODENAL PROCEDURE (DUODENAL SWITCH)  03/2016  . LITHOTRIPSY    . TONSILLECTOMY AND ADENOIDECTOMY    . URETEROSCOPY WITH HOLMIUM LASER LITHOTRIPSY Left 05/14/2016   Procedure: URETEROSCOPY WITH HOLMIUM LASER LITHOTRIPSY;  Surgeon: Vanna ScotlandAshley Brandon,  MD;  Location: [facility];  Service: Urology;  Laterality: Left;    Family Psychiatric History: I have reviewed family psychiatric history from my progress note on 06/20/2017.  Family History:  Family History  Problem Relation Age of Onset  . Depression Father   . Alcohol abuse Father   . Depression Mother   . Hyperlipidemia Mother   . Sleep apnea Mother   . Depression Sister   . Hyperlipidemia Brother   . Depression Brother   . Hyperlipidemia Brother    . Depression Brother   . Alcohol abuse Brother   . Coronary artery disease Maternal Grandmother   . Heart attack Maternal Grandmother   . Diabetes Maternal Grandmother   . Lung cancer Maternal Grandfather   . Emphysema Maternal Grandfather   . Coronary artery disease Maternal Grandfather   . Lupus Unknown        Aunt  . Fibromyalgia Unknown        Aunt    Social History: Reviewed social history from my progress note on 06/20/2017. Social History   Socioeconomic History  . Marital status: Married    Spouse name: brian  . Number of children: 2  . Years of education: Not on file  . Highest education level: Some college, no degree  Occupational History  . Occupation: Arts development officerHome maker  Social Needs  . Financial resource strain: Not hard at all  . Food insecurity:    Worry: Never true    Inability: Never true  . Transportation needs:    Medical: No    Non-medical: No  Tobacco Use  . Smoking status: Never Smoker  . Smokeless tobacco: Never Used  Substance and Sexual Activity  . Alcohol use: Yes    Alcohol/week: 2.0 - 3.0 standard drinks    Types: 2 Glasses of wine per week  . Drug use: No  . Sexual activity: Yes    Partners: Male    Birth control/protection: None, I.U.D.  Lifestyle  . Physical activity:    Days per week: 0 days    Minutes per session: 0 min  . Stress: To some extent  Relationships  . Social connections:    Talks on phone: More than three times a week    Gets together: Once a week    Attends religious service: Never    Active member of club or organization: No    Attends meetings of clubs or organizations: Never    Relationship status: Married  Other Topics Concern  . Not on file  Social History Narrative   1 child, 2 step-sons      Regular exercise-no   Diet: no fast food, likes sweets    Allergies:  Allergies  Allergen Reactions  . Molds & Smuts   . Pollen Extract   . Uncaria Tomentosa (Cats Claw)   . Milk-Related Compounds     Able to  tolerate yogurt; mild intolerance to milk/cheese  . Pineapple   . Soy Allergy   . Strawberry Extract     Break out on tongue noted    Metabolic Disorder Labs: Lab Results  Component Value Date   HGBA1C 4.7 (L) 05/22/2017   MPG 88.19 05/22/2017   No results found for: PROLACTIN Lab Results  Component Value Date   CHOL 105 05/22/2017   TRIG 41 05/22/2017   HDL 48 05/22/2017   CHOLHDL 2.2 05/22/2017   VLDL 8 05/22/2017   LDLCALC 49 05/22/2017   LDLCALC 99 09/11/2013   Lab Results  Component  Value Date   TSH 0.677 05/22/2017   TSH 3.71 10/01/2016    Therapeutic Level Labs: Lab Results  Component Value Date   LITHIUM 0.3 (L) 11/26/2017   LITHIUM 0.3 (L) 11/18/2017   No results found for: VALPROATE No components found for:  CBMZ  Current Medications: Current Outpatient Medications  Medication Sig Dispense Refill  . amantadine (SYMMETREL) 100 MG capsule Take 1 capsule (100 mg total) by mouth 2 (two) times daily. 180 capsule 0  . ARIPiprazole (ABILIFY) 30 MG tablet Take 1 tablet (30 mg total) by mouth daily. 90 tablet 0  . Biotin 5 MG TBDP Take by mouth.    Marland Kitchen buPROPion (WELLBUTRIN XL) 150 MG 24 hr tablet Take 1 tablet (150 mg total) by mouth daily. 90 tablet 1  . calcium citrate-vitamin D 500-400 MG-UNIT chewable tablet Chew 1 tablet by mouth daily.     . Cholecalciferol (VITAMIN D3) 125 MCG (5000 UT) CAPS Take 5,000 Units by mouth daily.  2  . hydrOXYzine (ATARAX/VISTARIL) 25 MG tablet Take 1 tablet (25 mg total) by mouth daily as needed for anxiety. 90 tablet 1  . Lactobacillus Rhamnosus, GG, (CULTURELLE) CAPS Take by mouth.    . lamoTRIgine (LAMICTAL) 100 MG tablet Take 1 tablet (100 mg total) by mouth daily. 90 tablet 1  . lithium carbonate (LITHOBID) 300 MG CR tablet Take 1 tablet (300 mg total) by mouth daily with supper. Mood symptoms 90 tablet 0  . montelukast (SINGULAIR) 10 MG tablet Take 10 mg by mouth daily.    . Multiple Vitamin (MULTIVITAMIN) tablet Take 1  tablet by mouth daily.    Marland Kitchen NIFEdipine (PROCARDIA-XL/ADALAT CC) 60 MG 24 hr tablet Take 60 mg by mouth daily.     Marland Kitchen omeprazole (PRILOSEC) 20 MG capsule Take 20 mg by mouth daily.    Marland Kitchen PARAGARD INTRAUTERINE COPPER IUD IUD by Intrauterine route.    Marland Kitchen Specialty Vitamins Products (BIOTIN PLUS KERATIN PO) Take 1 tablet by mouth daily.    . traMADol (ULTRAM) 50 MG tablet tramadol 50 mg tablet  Take 1 tablet every 6 hours by oral route.    . traZODone (DESYREL) 100 MG tablet Take 1 tablet (100 mg total) by mouth at bedtime as needed for sleep. 90 tablet 1   No current facility-administered medications for this visit.      Musculoskeletal: Strength & Muscle Tone: within normal limits Gait & Station: normal Patient leans: N/A  Psychiatric Specialty Exam: Review of Systems  Psychiatric/Behavioral: The patient is nervous/anxious.   All other systems reviewed and are negative.   Blood pressure 118/75, pulse (!) 56, temperature 98.3 F (36.8 C), temperature source Oral, weight (!) 304 lb 6.4 oz (138.1 kg).Body mass index is 52.25 kg/m.  General Appearance: Casual  Eye Contact:  Fair  Speech:  Clear and Coherent  Volume:  Normal  Mood:  Anxious  Affect:  Congruent  Thought Process:  Goal Directed and Descriptions of Associations: Intact  Orientation:  Full (Time, Place, and Person)  Thought Content: Logical   Suicidal Thoughts:  No  Homicidal Thoughts:  No  Memory:  Immediate;   Fair Recent;   Fair Remote;   Fair  Judgement:  Fair  Insight:  Fair  Psychomotor Activity:  Normal  Concentration:  Concentration: Fair and Attention Span: Fair  Recall:  [employer] of Knowledge: Fair  Language: Fair  Akathisia:  No  Handed:  Right  AIMS (if indicated): denies tremors, rigidity,stiffness  Assets:  Communication Skills Desire  for Improvement Social Support  ADL's:  Intact  Cognition: WNL  Sleep:  Fair   Screenings: AIMS     Office Visit from 12/11/2017 in [facility] Office Visit from 11/29/2017 in [facility] Office Visit from 11/20/2017 in [facility] Admission (Discharged) from 05/21/2017 in [facility]  AIMS Total Score  1  7  5   0    AUDIT     Admission (Discharged) from 05/21/2017 in [facility] INPATIENT BE[facility]AL MEDICINE  Alcohol Use Disorder Identification Test Final Score (AUDIT)  6       Assessment and Plan: Dalya is a 38 year old Caucasian female who has a history of bipolar disorder, anxiety disorder, binge eating disorder, presented to clinic today for a follow-up visit.  Patient continues to have psychosocial stressors as summarized above.  Patient however has been coping and continues to be in psychotherapy.  Will continue plan as noted below.  Plan Bipolar disorder-improving Abilify 30 mg p.o. daily Lamictal 100 mg p.o. daily Lithium 300 mg p.o. nightly Lithium level-11/19/2017-0.3-subtherapeutic.  However when the dosage was increased patient developed tremors and medication dosage was hence not increased. Wellbutrin XL 150 mg p.o. daily  For anxiety disorder- improving Hydroxyzine as needed  For neuroleptic induced parkinsonian tremors-resolved Patient does have amantadine however she has not been taking it since tremors are improved.  For insomnia-improving Trazodone 100 mg p.o. nightly as needed  Binge eating disorder in remission We will continue to monitor closely.  We will provide her lab slip today to get lithium labs-she will get it done at her next appointment.  Patient to continue psychotherapy sessions with therapist on a weekly basis.  Discussed distraction, coping techniques to deal with her current stressors.  Follow-up in clinic in 6 weeks or sooner if needed.  I have spent atleast 25 minutes face to face with patient today. More than 50 % of the time was spent for psychoeducation and supportive psychotherapy  and care coordination.  This note was generated in part or whole with voice recognition software. Voice recognition is usually quite accurate but there are transcription errors that can and very often do occur. I apologize for any typographical errors that were not detected and corrected.        Jomarie Longs, MD 02/13/2018, 1:45 PM

## 2018-03-21 ENCOUNTER — Encounter: Payer: Self-pay | Admitting: Psychiatry

## 2018-03-21 ENCOUNTER — Ambulatory Visit (INDEPENDENT_AMBULATORY_CARE_PROVIDER_SITE_OTHER): Admitting: Psychiatry

## 2018-03-21 ENCOUNTER — Other Ambulatory Visit: Payer: Self-pay

## 2018-03-21 VITALS — BP 135/82 | HR 71 | Temp 97.8°F | Wt 299.6 lb

## 2018-03-21 DIAGNOSIS — F411 Generalized anxiety disorder: Secondary | ICD-10-CM | POA: Diagnosis not present

## 2018-03-21 DIAGNOSIS — F3160 Bipolar disorder, current episode mixed, unspecified: Secondary | ICD-10-CM | POA: Diagnosis not present

## 2018-03-21 DIAGNOSIS — F5081 Binge eating disorder: Secondary | ICD-10-CM | POA: Diagnosis not present

## 2018-03-21 DIAGNOSIS — R41840 Attention and concentration deficit: Secondary | ICD-10-CM

## 2018-03-21 MED ORDER — SUVOREXANT 5 MG PO TABS: 5.0000 mg | ORAL_TABLET | Freq: Every day | ORAL | 0 refills | Status: DC

## 2018-03-21 MED ORDER — LAMOTRIGINE 25 MG PO TABS: 25.0000 mg | ORAL_TABLET | Freq: Every day | ORAL | 0 refills | Status: DC

## 2018-03-21 NOTE — Progress Notes (Signed)
BH MD OP Progress Note  03/21/2018 12:19 PM Sierra [name]  MRN:  [id number]  Chief Complaint: ' I am here for follow up." Chief Complaint    Follow-up; Medication Refill     HPI: Sierra [name] is a 38 yr old female , married , lives in [place], has a history of bipolar disorder, anxiety disorder, binge eating disorder, presented to the clinic today for a follow up visit.  Patient today reports she continues to have some ups and downs in her mood.  She is compliant on her medications as prescribed.  Patient reports sleep continues to be restless.  She does not think the trazodone is effective anymore.  Discussed adding Belsomra.  She agrees with plan.  Patient  reports she wonders whether she has ADHD.  She reports when she looks back into her life she remembers doing well in elementary school however she started having problems in high school and college with her grades.  She remembers having no motivation to do anything while in college and having inability to focus.  She reports she had this discussion with her therapist who helped her to pay more attention to her lifestyle.  She reports she has noticed that she continues to have trouble keeping everything organized at home, completing her task and things like that.  Patient was advised to do an ADHD self-report scale today.  Patient scored very high on the same.  Will refer her for an ADHD testing.  Patient denies any suicidality, homicidality or perceptual disturbances.   Visit Diagnosis:    ICD-10-CM   1. Bipolar I disorder, most recent episode mixed (HCC) F31.60 Suvorexant (BELSOMRA) 5 MG TABS    lamoTRIgine (LAMICTAL) 25 MG tablet  2. GAD (generalized anxiety disorder) F41.1   3. Binge eating disorder F50.81   4. Attention and concentration deficit R41.840     Past Psychiatric History: I have reviewed past psychiatric history from my progress note on 06/20/2017  Past Medical History:  Past Medical History:  Diagnosis Date  .  Abdominal pain, other specified site   . 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 Anxiety state, unspecified   . Bipolar 1 disorder, depressed (HCC) 05/21/2017  . BIPOLAR AFFECTIVE DISORDER 12/23/2006   Qualifier: Diagnosis of  By: Ermalene Searing MD, Amy    . Bipolar affective disorder (HCC) 10/12/2011  . Bipolar disorder, unspecified (HCC)   . Depressive disorder, not elsewhere classified   . Edema   . Esophageal reflux   . Fibromyalgia   . Headache   . History of kidney stones   . Mild or unspecified pre-eclampsia, unspecified as to episode of care   . Morbid obesity (HCC)   . Myalgia and myositis, unspecified   . Other dyspnea and respiratory abnormality   . Preeclampsia    with first pregnancy  . Raynaud's disease   . Urinary tract infection, site not specified     Past Surgical History:  Procedure Laterality Date  . CESAREAN SECTION     X 2  . CHOLECYSTECTOMY  03/2006  . CYSTOSCOPY W/ URETERAL STENT PLACEMENT Left 05/14/2016   Procedure: CYSTOSCOPY WITH STENT REPLACEMENT;  Surgeon: [name], MD;  Location: [facility];  Service: Urology;  Laterality: Left;  . CYSTOSCOPY WITH STENT PLACEMENT Left 04/25/2016   Procedure: CYSTOSCOPY WITH STENT PLACEMENT;  Surgeon: [name], MD;  Location: [facility];  Service: Urology;  Laterality: Left;  . DILATION AND CURETTAGE OF UTERUS    . IVC FILTER INSERTION  03/26/2016  [facility]  . LAPAROSCOPIC GASTRIC RESTRICTIVE DUODENAL PROCEDURE (DUODENAL SWITCH)  03/2016  . LITHOTRIPSY    . TONSILLECTOMY AND ADENOIDECTOMY    . URETEROSCOPY WITH HOLMIUM LASER LITHOTRIPSY Left 05/14/2016   Procedure: URETEROSCOPY WITH HOLMIUM LASER LITHOTRIPSY;  Surgeon: [name], MD;  Location: [facility];  Service: Urology;  Laterality: Left;    Family Psychiatric History: Reviewed family psychiatric history from my progress note on 06/20/2017  Family History:  Family History  Problem Relation Age of Onset  . Depression Father   . Alcohol abuse  Father   . Depression Mother   . Hyperlipidemia Mother   . Sleep apnea Mother   . Depression Sister   . Hyperlipidemia Brother   . Depression Brother   . Hyperlipidemia Brother   . Depression Brother   . Alcohol abuse Brother   . Coronary artery disease Maternal Grandmother   . Heart attack Maternal Grandmother   . Diabetes Maternal Grandmother   . Lung cancer Maternal Grandfather   . Emphysema Maternal Grandfather   . Coronary artery disease Maternal Grandfather   . Lupus Unknown        Aunt  . Fibromyalgia Unknown        Aunt    Social History: Reviewed social history from my progress note on 06/20/2017 Social History   Socioeconomic History  . Marital status: Married    Spouse name: brian  . Number of children: 2  . Years of education: Not on file  . Highest education level: Some college, no degree  Occupational History  . Occupation: Arts development officer  Social Needs  . Financial resource strain: Not hard at all  . Food insecurity:    Worry: Never true    Inability: Never true  . Transportation needs:    Medical: No    Non-medical: No  Tobacco Use  . Smoking status: Never Smoker  . Smokeless tobacco: Never Used  Substance and Sexual Activity  . Alcohol use: Yes    Alcohol/week: 2.0 - 3.0 standard drinks    Types: 2 Glasses of wine per week  . Drug use: No  . Sexual activity: Yes    Partners: Male    Birth control/protection: None, I.U.D.  Lifestyle  . Physical activity:    Days per week: 0 days    Minutes per session: 0 min  . Stress: To some extent  Relationships  . Social connections:    Talks on phone: More than three times a week    Gets together: Once a week    Attends religious service: Never    Active member of club or organization: No    Attends meetings of clubs or organizations: Never    Relationship status: Married  Other Topics Concern  . Not on file  Social History Narrative   1 child, 2 step-sons      Regular exercise-no   Diet: no fast  food, likes sweets    Allergies:  Allergies  Allergen Reactions  . Molds & Smuts   . Pollen Extract   . Uncaria Tomentosa (Cats Claw)   . Milk-Related Compounds     Able to tolerate yogurt; mild intolerance to milk/cheese  . Pineapple   . Soy Allergy   . Strawberry Extract     Break out on tongue noted    Metabolic Disorder Labs: Lab Results  Component Value Date   HGBA1C 4.7 (L) 05/22/2017   MPG 88.19 05/22/2017   No results found for: PROLACTIN Lab Results  Component Value Date   CHOL 105 05/22/2017   TRIG 41 05/22/2017   HDL 48 05/22/2017   CHOLHDL 2.2 05/22/2017   VLDL 8 05/22/2017   LDLCALC 49 05/22/2017   LDLCALC 99 09/11/2013   Lab Results  Component Value Date   TSH 0.677 05/22/2017   TSH 3.71 10/01/2016    Therapeutic Level Labs: Lab Results  Component Value Date   LITHIUM 0.3 (L) 11/26/2017   LITHIUM 0.3 (L) 11/18/2017   No results found for: VALPROATE No components found for:  CBMZ  Current Medications: Current Outpatient Medications  Medication Sig Dispense Refill  . amantadine (SYMMETREL) 100 MG capsule Take 1 capsule (100 mg total) by mouth 2 (two) times daily. 180 capsule 0  . ARIPiprazole (ABILIFY) 30 MG tablet Take 1 tablet (30 mg total) by mouth daily. 90 tablet 0  . Biotin 5 MG TBDP Take by mouth.    Marland Kitchen buPROPion (WELLBUTRIN XL) 150 MG 24 hr tablet Take 1 tablet (150 mg total) by mouth daily. 90 tablet 1  . calcium citrate-vitamin D 500-400 MG-UNIT chewable tablet Chew 1 tablet by mouth daily.     . Cholecalciferol (VITAMIN D3) 125 MCG (5000 UT) CAPS Take 5,000 Units by mouth daily.  2  . hydrOXYzine (ATARAX/VISTARIL) 25 MG tablet Take 1 tablet (25 mg total) by mouth daily as needed for anxiety. 90 tablet 1  . Lactobacillus Rhamnosus, GG, (CULTURELLE) CAPS Take by mouth.    . lamoTRIgine (LAMICTAL) 100 MG tablet Take 1 tablet (100 mg total) by mouth daily. 90 tablet 1  . lithium carbonate (LITHOBID) 300 MG CR tablet Take 1 tablet (300 mg  total) by mouth daily with supper. Mood symptoms 90 tablet 0  . montelukast (SINGULAIR) 10 MG tablet Take 10 mg by mouth daily.    . Multiple Vitamin (MULTIVITAMIN) tablet Take 1 tablet by mouth daily.    Marland Kitchen NIFEdipine (PROCARDIA-XL/ADALAT CC) 60 MG 24 hr tablet Take 60 mg by mouth daily.     Marland Kitchen omeprazole (PRILOSEC) 20 MG capsule Take 20 mg by mouth daily.    Marland Kitchen PARAGARD INTRAUTERINE COPPER IUD IUD by Intrauterine route.    Marland Kitchen Specialty Vitamins Products (BIOTIN PLUS KERATIN PO) Take 1 tablet by mouth daily.    . traMADol (ULTRAM) 50 MG tablet tramadol 50 mg tablet  Take 1 tablet every 6 hours by oral route.    . lamoTRIgine (LAMICTAL) 25 MG tablet Take 1 tablet (25 mg total) by mouth daily. To be added to 100 mg daily 30 tablet 0  . Suvorexant (BELSOMRA) 5 MG TABS Take 5 mg by mouth at bedtime. 30 tablet 0   No current facility-administered medications for this visit.      Musculoskeletal: Strength & Muscle Tone: within normal limits Gait & Station: normal Patient leans: N/A  Psychiatric Specialty Exam: Review of Systems  Psychiatric/Behavioral: The patient is nervous/anxious and has insomnia.   All other systems reviewed and are negative.   Blood pressure 135/82, pulse 71, temperature 97.8 F (36.6 C), temperature source Oral, weight 299 lb 9.6 oz (135.9 kg).Body mass index is 51.43 kg/m.  General Appearance: Casual  Eye Contact:  Fair  Speech:  Clear and Coherent  Volume:  Normal  Mood:  Anxious  Affect:  Congruent  Thought Process:  Goal Directed and Descriptions of Associations: Intact  Orientation:  Full (Time, Place, and Person)  Thought Content: Logical   Suicidal Thoughts:  No  Homicidal Thoughts:  No  Memory:  Immediate;  Fair Recent;   Fair Remote;   Fair  Judgement:  Fair  Insight:  Fair  Psychomotor Activity:  Normal  Concentration:  Concentration: Fair and Attention Span: Fair  Recall:  FiservFair  Fund of Knowledge: Fair  Language: Fair  Akathisia:  No   Handed:  Right  AIMS (if indicated): denies tremors, rigidity,stiffness  Assets:  Communication Skills Desire for Improvement Social Support  ADL's:  Intact  Cognition: WNL  Sleep:  Poor   Screenings: AIMS     Office Visit from [facility]/11/2017 in [facility] Office Visit from 11/29/2017 in [facility] Office Visit from 11/20/2017 in [facility] Admission (Discharged) from [facility]2017 [facility]  AIMS Total Score  1  7  5   0    AUDIT     [facility]  Alcohol Use Disorder Identification Test Final Score (AUDIT)  6       Assessment and Plan: Sierra [name] is a 38 year old Caucasian female who has a history of bipolar disorder, anxiety disorder, binge eating disorder, presented to clinic today for a follow-up visit.  Patient continues to have psychosocial stressors.  She also struggles with attention and concentration problems as well as sleep problems.  We will continue to make medication changes as noted below.  Plan Bipolar disorder- unstable Abilify 30 mg p.o. daily Increase Lamictal to 125 mg p.o. daily Lithium 300 mg p.o. nightly-reduced dosage Lithium level-11/19/2017-0.3-subtherapeutic.  Patient had tremors on increased dosage previously. Wellbutrin XL 150 mg p.o. daily  For anxiety disorder- improving Hydroxyzine as needed.  For neuroleptic induced Parkinson tremors-resolved We will continue to monitor closely.  For insomnia-unstable Discontinue trazodone. Start Belsomra 5 mg p.o. nightly  R/O ADHD- Patient completed ADHD questionnaire and scored high on the same.  Will refer her to [place] attention specialist today for ADHD testing.  Follow-up in clinic in 2 weeks or sooner if needed.  I have spent atleast 25 minutes face to face with patient today. More than 50 % of the time was spent for  psychoeducation and supportive psychotherapy and care coordination.  This note was generated in part or whole with voice recognition software. Voice recognition is usually quite accurate but there are transcription errors that can and very often do occur. I apologize for any typographical errors that were not detected and corrected.       Jomarie LongsSaramma Allen Basista, MD 03/21/2018, 12:19 PM

## 2018-03-21 NOTE — Patient Instructions (Signed)
Suvorexant oral tablets What is this medicine? SUVOREXANT (su-vor-EX-ant) is used to treat insomnia. This medicine helps you to fall asleep and sleep through the night. This medicine may be used for other purposes; ask your health care provider or pharmacist if you have questions. COMMON BRAND NAME(S): Belsomra What should I tell my health care provider before I take this medicine? They need to know if you have any of these conditions: -depression -history of a drug or alcohol abuse problem -history of daytime sleepiness -history of sudden onset of muscle weakness (cataplexy) -liver disease -lung or breathing disease -narcolepsy -suicidal thoughts, plans, or attempt; a previous suicide attempt by you or a family member -an unusual or allergic reaction to suvorexant, other medicines, foods, dyes, or preservatives -pregnant or trying to get pregnant -breast-feeding How should I use this medicine? Take this medicine by mouth within 30 minutes of going to bed. Do not take it unless you are able to stay in bed a full night before you must be active again. Follow the directions on the prescription label. For best results, it is better to take this medicine on an empty stomach. Do not take your medicine more often than directed. Do not stop taking this medicine on your own. Always follow your doctor or health care professional's advice. A special MedGuide will be given to you by the pharmacist with each prescription and refill. Be sure to read this information carefully each time. Talk to your pediatrician regarding the use of this medicine in children. Special care may be needed. Overdosage: If you think you have taken too much of this medicine contact a poison control center or emergency room at once. NOTE: This medicine is only for you. Do not share this medicine with others. What if I miss a dose? This medicine should only be taken immediately before going to sleep. Do not take double or extra  doses. What may interact with this medicine? -alcohol -antiviral medicines for HIV or AIDS -aprepitant -carbamazepine -certain antibiotics like ciprofloxacin, clarithromycin, erythromycin, telithromycin -certain medicines for depression or psychotic disturbances -certain medicines for fungal infections like ketoconazole, posaconazole, fluconazole, or itraconazole -conivaptan -digoxin -diltiazem -grapefruit juice -imatinib -medicines for anxiety or sleep -phenytoin -rifampin -verapamil This list may not describe all possible interactions. Give your health care provider a list of all the medicines, herbs, non-prescription drugs, or dietary supplements you use. Also tell them if you smoke, drink alcohol, or use illegal drugs. Some items may interact with your medicine. What should I watch for while using this medicine? Visit your doctor or health care professional for regular checks on your progress. Keep a regular sleep schedule by going to bed at about the same time each night. Avoid caffeine-containing drinks in the evening hours. When sleep medicines are used every night for more than a few weeks, they may stop working. Do not increase the dose on your own. Talk to your doctor if your insomnia worsens or is not better within 7 to 10 days. After taking this medicine, you may get up out of bed and do an activity that you do not know you are doing. The next morning, you may have no memory of this. Activities include driving a car ("sleep-driving"), making and eating food, talking on the phone, sexual activity, and sleep-walking. Serious injuries have occurred. Call your doctor right away if you find out you have done any of these activities. Do not take this medicine if you have used alcohol that evening. Do not take it   if you have taken another medicine for sleep. The risk of doing these sleep-related activities is higher. Do not take this medicine unless you are able to stay in bed for a full  night (7 to 8 hours) and do not drive or perform other activities requiring full alertness within 8 hours of a dose. Do not drive, use machinery, or do anything that needs mental alertness the day after you take the 20 mg dose of this medicine. The use of lower doses (10 mg) also has the potential to cause driving impairment the next day. You may have a decrease in mental alertness the day after use, even if you feel that you are fully awake. Tell your doctor if you will need to perform activities requiring full alertness, such as driving, the next day. Do not stand or sit up quickly after taking this medicine, especially if you are an older patient. This reduces the risk of dizzy or fainting spells. If you or your family notice any changes in your behavior, such as new or worsening depression, thoughts of harming yourself, anxiety, other unusual or disturbing thoughts, or memory loss, call your doctor right away. What side effects may I notice from receiving this medicine? Side effects that you should report to your doctor or health care professional as soon as possible: -allergic reactions like skin rash, itching or hives, swelling of the face, lips, or tongue -confusion -depressed mood -feeling faint or lightheaded, falls -hallucinations -inability to move or speak for up to several minutes while you are going to sleep or waking up -memory loss -periods of leg weakness lasting from seconds to a few minutes -problems with balance, speaking, walking -restlessness, excitability, or feelings of agitation -unusual activities while asleep like driving, eating, making phone calls Side effects that usually do not require medical attention (report to your doctor or health care professional if they continue or are bothersome): -abnormal dreams -daytime drowsiness -diarrhea -dizziness -headache This list may not describe all possible side effects. Call your doctor for medical advice about side effects.  You may report side effects to FDA at 1-800-FDA-1088. Where should I keep my medicine? Keep out of the reach of children. This medicine can be abused. Keep your medicine in a safe place to protect it from theft. Do not share this medicine with anyone. Selling or giving away this medicine is dangerous and against the law. Store at room temperature between 15 and 30 degrees C (59 and 86 degrees F). Throw away any unused medicine after the expiration date. NOTE: This sheet is a summary. It may not cover all possible information. If you have questions about this medicine, talk to your doctor, pharmacist, or health care provider.  2019 Elsevier/Gold Standard (2017-06-28 12:32:42)  

## 2018-03-28 ENCOUNTER — Ambulatory Visit: Admitting: Psychiatry

## 2018-04-05 ENCOUNTER — Other Ambulatory Visit: Payer: Self-pay | Admitting: Psychiatry

## 2018-04-05 DIAGNOSIS — F3160 Bipolar disorder, current episode mixed, unspecified: Secondary | ICD-10-CM

## 2018-04-07 ENCOUNTER — Ambulatory Visit: Admitting: Psychiatry

## 2018-04-12 ENCOUNTER — Other Ambulatory Visit: Payer: Self-pay | Admitting: Psychiatry

## 2018-04-12 DIAGNOSIS — F3160 Bipolar disorder, current episode mixed, unspecified: Secondary | ICD-10-CM

## 2018-04-14 MED ORDER — LAMOTRIGINE 25 MG PO TABS: 25.0000 mg | ORAL_TABLET | Freq: Every day | ORAL | 0 refills | Status: DC

## 2018-04-14 NOTE — Telephone Encounter (Signed)
Sent Lamictal to pharmacy 

## 2018-05-15 ENCOUNTER — Other Ambulatory Visit: Payer: Self-pay

## 2018-05-15 ENCOUNTER — Encounter: Payer: Self-pay | Admitting: Psychiatry

## 2018-05-15 ENCOUNTER — Ambulatory Visit (INDEPENDENT_AMBULATORY_CARE_PROVIDER_SITE_OTHER): Admitting: Psychiatry

## 2018-05-15 DIAGNOSIS — F5105 Insomnia due to other mental disorder: Secondary | ICD-10-CM

## 2018-05-15 DIAGNOSIS — F411 Generalized anxiety disorder: Secondary | ICD-10-CM

## 2018-05-15 DIAGNOSIS — F5081 Binge eating disorder: Secondary | ICD-10-CM | POA: Diagnosis not present

## 2018-05-15 DIAGNOSIS — F3160 Bipolar disorder, current episode mixed, unspecified: Secondary | ICD-10-CM

## 2018-05-15 MED ORDER — ESZOPICLONE 2 MG PO TABS: 2.0000 mg | ORAL_TABLET | Freq: Every day | ORAL | 1 refills | Status: DC

## 2018-05-15 MED ORDER — BUPROPION HCL ER (XL) 150 MG PO TB24: 150.0000 mg | ORAL_TABLET | Freq: Every day | ORAL | 1 refills | Status: DC

## 2018-05-15 MED ORDER — ARIPIPRAZOLE 30 MG PO TABS: 30.0000 mg | ORAL_TABLET | Freq: Every day | ORAL | 0 refills | Status: DC

## 2018-05-15 MED ORDER — LAMOTRIGINE 25 MG PO TABS: 50.0000 mg | ORAL_TABLET | Freq: Every day | ORAL | 0 refills | Status: DC

## 2018-05-15 MED ORDER — HYDROXYZINE HCL 25 MG PO TABS: 25.0000 mg | ORAL_TABLET | Freq: Every day | ORAL | 1 refills | Status: DC | PRN

## 2018-05-15 NOTE — Progress Notes (Signed)
Virtual Visit via Video Note  I connected with Sierra [name] on 05/15/18 at  9:15 AM EDT by a video enabled telemedicine application and verified that I am speaking with the correct person using two identifiers.   I discussed the limitations of evaluation and management by telemedicine and the availability of in person appointments. The patient expressed understanding and agreed to proceed.   I discussed the assessment and treatment plan with the patient. The patient was provided an opportunity to ask questions and all were answered. The patient agreed with the plan and demonstrated an understanding of the instructions.   The patient was advised to call back or seek an in-person evaluation if the symptoms worsen or if the condition fails to improve as anticipated.   BH MD OP Progress Note  05/15/2018 5:46 PM Sierra [name]  MRN:  [id number]  Chief Complaint:  Chief Complaint    Follow-up     HPI: Sierra [name] is a 38 year old Caucasian female, married, lives in [place], has a history of bipolar disorder, anxiety disorder, binge eating disorder was evaluated by telemedicine today.  Patient reports she is currently struggling with a lot of anxiety symptoms.  She reports it is likely due to her home being crowded as well as her inability to get out often due to the COVID-19 outbreak.  Patient reports sleep continues to be restless.  She was not able to get the Belsomra due to cost.  Reports she has tried [place] previously which helped.  Discussed restarting Lunesta.  Patient denies any suicidality, homicidality or perceptual disturbances.  She has not had psychotherapy sessions due to the COVID-19 outbreak.  Discussed with patient to reach out to her therapist to see if they offer telemetry video consult.  Some time was spent providing supportive psychotherapy, discussed ways to distract herself, relaxation techniques, being aware of her mood symptoms, her triggers as well as taking  time away from activities to ground herself.  Patient denies any other concerns today.   Visit Diagnosis:    ICD-10-CM   1. Bipolar I disorder, most recent episode mixed (HCC) F31.60 buPROPion (WELLBUTRIN XL) 150 MG 24 hr tablet    ARIPiprazole (ABILIFY) 30 MG tablet    lamoTRIgine (LAMICTAL) 25 MG tablet  2. GAD (generalized anxiety disorder) F41.1 hydrOXYzine (ATARAX/VISTARIL) 25 MG tablet  3. Binge eating disorder F50.81   4. Insomnia due to mental condition F51.05 eszopiclone (LUNESTA) 2 MG TABS tablet    Past Psychiatric History: I have reviewed past psychiatric history from my progress note on 06/20/2017  Past Medical History:  Past Medical History:  Diagnosis Date  . Abdominal pain, other specified site   . 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 Anxiety state, unspecified   . Bipolar 1 disorder, depressed (HCC) 05/21/2017  . BIPOLAR AFFECTIVE DISORDER 12/23/2006   Qualifier: Diagnosis of  By: Ermalene Searing MD, Amy    . Bipolar affective disorder (HCC) 10/12/2011  . Bipolar disorder, unspecified (HCC)   . Depressive disorder, not elsewhere classified   . Edema   . Esophageal reflux   . Fibromyalgia   . Headache   . History of kidney stones   . Mild or unspecified pre-eclampsia, unspecified as to episode of care   . Morbid obesity (HCC)   . Myalgia and myositis, unspecified   . Other dyspnea and respiratory abnormality   . Preeclampsia    with first pregnancy  . Raynaud's disease   . Urinary tract infection, site not specified  Past Surgical History:  Procedure Laterality Date  . CESAREAN SECTION     X 2  . CHOLECYSTECTOMY  03/2006  . CYSTOSCOPY W/ URETERAL STENT PLACEMENT Left 05/14/2016   Procedure: CYSTOSCOPY WITH STENT REPLACEMENT;  Surgeon: [name], MD;  Location: [facility] ORS;  Service: Urology;  Laterality: Left;  . CYSTOSCOPY WITH STENT PLACEMENT Left 04/25/2016   Procedure: CYSTOSCOPY WITH STENT PLACEMENT;  Surgeon: [name], MD;  Location: [facility]  ORS;  Service: Urology;  Laterality: Left;  . DILATION AND CURETTAGE OF UTERUS    . IVC FILTER INSERTION  03/26/2016   [facility]  . LAPAROSCOPIC GASTRIC RESTRICTIVE DUODENAL PROCEDURE (DUODENAL SWITCH)  03/2016  . LITHOTRIPSY    . TONSILLECTOMY AND ADENOIDECTOMY    . URETEROSCOPY WITH HOLMIUM LASER LITHOTRIPSY Left 05/14/2016   Procedure: URETEROSCOPY WITH HOLMIUM LASER LITHOTRIPSY;  Surgeon: [name], MD;  Location: [facility] ORS;  Service: Urology;  Laterality: Left;    Family Psychiatric History: Family psychiatric history from my progress note on 06/20/2017  Family History:  Family History  Problem Relation Age of Onset  . Depression Father   . Alcohol abuse Father   . Depression Mother   . Hyperlipidemia Mother   . Sleep apnea Mother   . Depression Sister   . Hyperlipidemia Brother   . Depression Brother   . Hyperlipidemia Brother   . Depression Brother   . Alcohol abuse Brother   . Coronary artery disease Maternal Grandmother   . Heart attack Maternal Grandmother   . Diabetes Maternal Grandmother   . Lung cancer Maternal Grandfather   . Emphysema Maternal Grandfather   . Coronary artery disease Maternal Grandfather   . Lupus Unknown        Aunt  . Fibromyalgia Unknown        Aunt    Social History: Reviewed social history from my progress note on 06/20/2017 Social History   Socioeconomic History  . Marital status: Married    Spouse name: brian  . Number of children: 2  . Years of education: Not on file  . Highest education level: Some college, no degree  Occupational History  . Occupation: Arts development officer  Social Needs  . Financial resource strain: Not hard at all  . Food insecurity:    Worry: Never true    Inability: Never true  . Transportation needs:    Medical: No    Non-medical: No  Tobacco Use  . Smoking status: Never Smoker  . Smokeless tobacco: Never Used  Substance and Sexual Activity  . Alcohol use: Yes    Alcohol/week: 2.0 - 3.0 standard  drinks    Types: 2 Glasses of wine per week  . Drug use: No  . Sexual activity: Yes    Partners: Male    Birth control/protection: None, I.U.D.  Lifestyle  . Physical activity:    Days per week: 0 days    Minutes per session: 0 min  . Stress: To some extent  Relationships  . Social connections:    Talks on phone: More than three times a week    Gets together: Once a week    Attends religious service: Never    Active member of club or organization: No    Attends meetings of clubs or organizations: Never    Relationship status: Married  Other Topics Concern  . Not on file  Social History Narrative   1 child, 2 step-sons      Regular exercise-no   Diet: no fast food,  likes sweets    Allergies:  Allergies  Allergen Reactions  . Molds & Smuts   . Pollen Extract   . Uncaria Tomentosa (Cats Claw)   . Milk-Related Compounds     Able to tolerate yogurt; mild intolerance to milk/cheese  . Pineapple   . Soy Allergy   . Strawberry Extract     Break out on tongue noted    Metabolic Disorder Labs: Lab Results  Component Value Date   HGBA1C 4.7 (L) 05/22/2017   MPG 88.19 05/22/2017   No results found for: PROLACTIN Lab Results  Component Value Date   CHOL 105 05/22/2017   TRIG 41 05/22/2017   HDL 48 05/22/2017   CHOLHDL 2.2 05/22/2017   VLDL 8 05/22/2017   LDLCALC 49 05/22/2017   LDLCALC 99 09/11/2013   Lab Results  Component Value Date   TSH 0.677 05/22/2017   TSH 3.71 10/01/2016    Therapeutic Level Labs: Lab Results  Component Value Date   LITHIUM 0.3 (L) 11/26/2017   LITHIUM 0.3 (L) 11/18/2017   No results found for: VALPROATE No components found for:  CBMZ  Current Medications: Current Outpatient Medications  Medication Sig Dispense Refill  . ARIPiprazole (ABILIFY) 30 MG tablet Take 1 tablet (30 mg total) by mouth daily. 90 tablet 0  . Biotin 5 MG TBDP Take by mouth.    Marland Kitchen buPROPion (WELLBUTRIN XL) 150 MG 24 hr tablet Take 1 tablet (150 mg total) by  mouth daily. 90 tablet 1  . calcium citrate-vitamin D 500-400 MG-UNIT chewable tablet Chew 1 tablet by mouth daily.     . Cholecalciferol (VITAMIN D3) 125 MCG (5000 UT) CAPS Take 5,000 Units by mouth daily.  2  . eszopiclone (LUNESTA) 2 MG TABS tablet Take 1 tablet (2 mg total) by mouth at bedtime. Take immediately before bedtime 30 tablet 1  . hydrOXYzine (ATARAX/VISTARIL) 25 MG tablet Take 1 tablet (25 mg total) by mouth daily as needed for anxiety. 90 tablet 1  . Lactobacillus Rhamnosus, GG, (CULTURELLE) CAPS Take by mouth.    . lamoTRIgine (LAMICTAL) 100 MG tablet Take 1 tablet (100 mg total) by mouth daily. 90 tablet 1  . lamoTRIgine (LAMICTAL) 25 MG tablet Take 2 tablets (50 mg total) by mouth daily. To be added to 100 mg daily 180 tablet 0  . lithium carbonate (LITHOBID) 300 MG CR tablet TAKE 1 TABLET BY MOUTH EVERY DAY WITH SUPPER 90 tablet 0  . montelukast (SINGULAIR) 10 MG tablet Take 10 mg by mouth daily.    . Multiple Vitamin (MULTIVITAMIN) tablet Take 1 tablet by mouth daily.    Marland Kitchen NIFEdipine (PROCARDIA-XL/ADALAT CC) 60 MG 24 hr tablet Take 60 mg by mouth daily.     Marland Kitchen omeprazole (PRILOSEC) 20 MG capsule Take 20 mg by mouth daily.    Marland Kitchen PARAGARD INTRAUTERINE COPPER IUD IUD by Intrauterine route.    Marland Kitchen Specialty Vitamins Products (BIOTIN PLUS KERATIN PO) Take 1 tablet by mouth daily.    . traMADol (ULTRAM) 50 MG tablet tramadol 50 mg tablet  Take 1 tablet every 6 hours by oral route.     No current facility-administered medications for this visit.      Musculoskeletal: Strength & Muscle Tone: UTA Gait & Station: normal Patient leans: N/A  Psychiatric Specialty Exam: Review of Systems  Psychiatric/Behavioral: The patient is nervous/anxious and has insomnia.   All other systems reviewed and are negative.   There were no vitals taken for this visit.There is no height or  weight on file to calculate BMI.  General Appearance: Casual  Eye Contact:  Fair  Speech:  Clear and  Coherent  Volume:  Normal  Mood:  Anxious  Affect:  Appropriate  Thought Process:  Goal Directed and Descriptions of Associations: Intact  Orientation:  Full (Time, Place, and Person)  Thought Content: Logical   Suicidal Thoughts:  No  Homicidal Thoughts:  No  Memory:  Immediate;   Fair Recent;   Fair Remote;   Fair  Judgement:  Fair  Insight:  Good  Psychomotor Activity:  Normal  Concentration:  Concentration: Fair and Attention Span: Fair  Recall:  FiservFair  Fund of Knowledge: Fair  Language: Fair  Akathisia:  No  Handed:  Right  AIMS (if indicated): denies tremors, rigidity  Assets:  Communication Skills Desire for Improvement Housing Social Support  ADL's:  Intact  Cognition: WNL  Sleep:  Poor   Screenings: AIMS     [facility] Visit from [facility]11/2017 [facility] Office [facility] Office Visit from 11/20/2017 in [facility] Admission (Discharged) from [facility]  AIMS Total Score  1  7  5   0    AUDIT     Admission (Discharged) from [facility]21/2017 [facility]  Alcohol Use Disorder Identification Test Final Score (AUDIT)  6       Assessment and Plan: Sierra [name] is a 38 year old Caucasian female who has a history of bipolar disorder, anxiety disorder, binge eating disorder was evaluated by telemedicine today.  Patient continues to have several psychosocial stressors including the COVID-19 outbreak.  Patient will continue to benefit from medication readjustment for her anxiety and sleep problems.  Plan as noted below.  Plan Bipolar disorder- unstable Increase Lamictal to 150 mg p.o. daily Lithium 300 mg p.o. nightly-reduced dosage Lithium level-11/19/2017-0.3-subtherapeutic.  Patient had tremors on higher dosage. Abilify 30 mg p.o. daily. Wellbutrin XL 150 mg p.o. daily  For anxiety  disorder-unstable Hydroxyzine as needed Discussed with patient to reach out to her therapist.  For insomnia-unstable Discontinue Belsomra Start Lunesta 2 mg p.o. nightly  To rule out ADHD-patient was [place] attention [place] previously-pending.  Follow-up in clinic in 2 to 3 weeks or sooner if needed.  I have spent atleast 25 minutes non face to face with patient today. More than 50 % of the time was spent for psychoeducation and supportive psychotherapy and care coordination.  This note was generated in part or whole with voice recognition software. Voice recognition is usually quite accurate but there are transcription errors that can and very often do occur. I apologize for any typographical errors that were not detected and corrected.       Jomarie LongsSaramma Cloee Dunwoody, MD 05/15/2018, 5:46 PM

## 2018-05-19 ENCOUNTER — Other Ambulatory Visit: Payer: Self-pay

## 2018-05-20 ENCOUNTER — Encounter: Payer: Self-pay | Admitting: Obstetrics & Gynecology

## 2018-05-20 ENCOUNTER — Other Ambulatory Visit: Payer: Self-pay

## 2018-05-20 ENCOUNTER — Ambulatory Visit (INDEPENDENT_AMBULATORY_CARE_PROVIDER_SITE_OTHER): Admitting: Obstetrics & Gynecology

## 2018-05-20 VITALS — BP 134/76

## 2018-05-20 DIAGNOSIS — N921 Excessive and frequent menstruation with irregular cycle: Secondary | ICD-10-CM

## 2018-05-20 DIAGNOSIS — N870 Mild cervical dysplasia: Secondary | ICD-10-CM | POA: Diagnosis not present

## 2018-05-20 DIAGNOSIS — Z975 Presence of (intrauterine) contraceptive device: Secondary | ICD-10-CM

## 2018-05-20 DIAGNOSIS — R87810 Cervical high risk human papillomavirus (HPV) DNA test positive: Secondary | ICD-10-CM

## 2018-05-20 NOTE — Progress Notes (Signed)
    [name] 1980/10/15 [id number]        38 y.o.  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  Abstinent x 6 months as boyfriend is out of town due to the [place] pandemic crisis.   OB History  Gravida Para Term Preterm AB Living  5 2     2 2   SAB TAB Ectopic Multiple Live Births  2            # Outcome Date GA Lbr Len/2nd Weight Sex Delivery Anes PTL Lv  5 Gravida           4 SAB           3 SAB           2 Para           1 Para             Past medical history,surgical history, problem list, medications, allergies, family history and social history were all reviewed and documented in the EPIC chart.   Directed ROS with pertinent positives and negatives documented in the history of present illness/assessment and plan.  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  Speculum placed in vagina and excellent visualization of cervix achieved, cervix swabbed x 3 with acetic acid solution.  Findings:  Cervix colposcopy: Physical Exam Genitourinary:       Vaginal colposcopy: Normal  Vulvar colposcopy: Normal  Perirectal colposcopy: Normal  The cervix was sprayed with [place] before performing the cervical biopsies.  Specimens: Gono-Chlam done.  Cervical Bxs at 12, 2, 4, 6, 10 O'Clock.  Complications:  None, hemostasis with Silver Nitrate and [place]. . Plan:  Per Cervical Bx results.   Assessment/Plan:  38 y.o. [id number]   1. 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  Colposcopy done today, well-tolerated with no complication.  Colposcopy findings discussed with patient.  STD screening done with chlamydia and gonorrhea.  - Pathology Report ([facility]) - C. trachomatis/N. gonorrhoeae RNA  2. 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 trachomatis/N. gonorrhoeae RNA  3. Breakthrough bleeding associated with intrauterine device (IUD) Breakthrough bleeding could be associated with intrauterine device.  Rule out gonorrhea and chlamydia. - C. trachomatis/N. gonorrhoeae RNA  Counseling on above issues and coordination of care more than 50% for 10 minutes.  Genia Del MD, 2:34 PM 05/20/2018

## 2018-05-21 ENCOUNTER — Encounter: Payer: Self-pay | Admitting: Oncology

## 2018-05-21 ENCOUNTER — Other Ambulatory Visit: Payer: Self-pay

## 2018-05-21 ENCOUNTER — Inpatient Hospital Stay: Attending: Oncology | Admitting: Oncology

## 2018-05-21 ENCOUNTER — Inpatient Hospital Stay: Admitting: *Deleted

## 2018-05-21 DIAGNOSIS — R5381 Other malaise: Secondary | ICD-10-CM | POA: Insufficient documentation

## 2018-05-21 DIAGNOSIS — D509 Iron deficiency anemia, unspecified: Secondary | ICD-10-CM | POA: Diagnosis not present

## 2018-05-21 DIAGNOSIS — R5383 Other fatigue: Secondary | ICD-10-CM | POA: Insufficient documentation

## 2018-05-21 DIAGNOSIS — Z9884 Bariatric surgery status: Secondary | ICD-10-CM

## 2018-05-21 DIAGNOSIS — N920 Excessive and frequent menstruation with regular cycle: Secondary | ICD-10-CM | POA: Insufficient documentation

## 2018-05-21 DIAGNOSIS — Z79899 Other long term (current) drug therapy: Secondary | ICD-10-CM | POA: Diagnosis not present

## 2018-05-21 LAB — CBC WITH DIFFERENTIAL/PLATELET
Abs Immature Granulocytes: 0.02 10*3/uL (ref 0.00–0.07)
Basophils Absolute: 0 10*3/uL (ref 0.0–0.1)
Basophils Relative: 1 %
Eosinophils Absolute: 0.1 10*3/uL (ref 0.0–0.5)
Eosinophils Relative: 1 %
HCT: 37.5 % (ref 36.0–46.0)
Hemoglobin: 12.2 g/dL (ref 12.0–15.0)
Immature Granulocytes: 0 %
Lymphocytes Relative: 18 %
Lymphs Abs: 1.1 10*3/uL (ref 0.7–4.0)
MCH: 28 pg (ref 26.0–34.0)
MCHC: 32.5 g/dL (ref 30.0–36.0)
MCV: 86 fL (ref 80.0–100.0)
Monocytes Absolute: 0.5 10*3/uL (ref 0.1–1.0)
Monocytes Relative: 8 %
Neutro Abs: 4.6 10*3/uL (ref 1.7–7.7)
Neutrophils Relative %: 72 %
Platelets: 294 10*3/uL (ref 150–400)
RBC: 4.36 MIL/uL (ref 3.87–5.11)
RDW: 14.9 % (ref 11.5–15.5)
WBC: 6.3 10*3/uL (ref 4.0–10.5)
nRBC: 0 % (ref 0.0–0.2)

## 2018-05-21 LAB — FOLATE: Folate: 32 ng/mL (ref >5.9)

## 2018-05-21 LAB — IRON AND TIBC
Iron: 36 ug/dL (ref 28–170)
Saturation Ratios: 8 % — ABNORMAL LOW (ref 10.4–31.8)
TIBC: 459 ug/dL — ABNORMAL HIGH (ref 250–450)
UIBC: 423 ug/dL

## 2018-05-21 LAB — FERRITIN: Ferritin: 8 ng/mL — ABNORMAL LOW (ref 11–307)

## 2018-05-21 LAB — TSH: TSH: 0.504 u[IU]/mL (ref 0.350–4.500)

## 2018-05-21 LAB — C. TRACHOMATIS/N. GONORRHOEAE RNA
C. trachomatis RNA, TMA: NOT DETECTED
N. gonorrhoeae RNA, TMA: NOT DETECTED

## 2018-05-21 LAB — VITAMIN B12: Vitamin B-12: 1752 pg/mL — ABNORMAL HIGH (ref 180–914)

## 2018-05-21 NOTE — Progress Notes (Signed)
She says her labs are low with ferritin and iron. Her [facility] gave her rx for iron pills and insurance would not pay for them. She  Is tired all the time. She does not see blood in urine or stool.

## 2018-05-22 ENCOUNTER — Other Ambulatory Visit: Payer: Self-pay | Admitting: *Deleted

## 2018-05-22 DIAGNOSIS — D509 Iron deficiency anemia, unspecified: Secondary | ICD-10-CM | POA: Diagnosis not present

## 2018-05-22 LAB — URINALYSIS, COMPLETE (UACMP) WITH MICROSCOPIC
Bilirubin Urine: NEGATIVE
Glucose, UA: NEGATIVE mg/dL
Ketones, ur: NEGATIVE mg/dL
Nitrite: NEGATIVE
Protein, ur: NEGATIVE mg/dL
Specific Gravity, Urine: 1.019 (ref 1.005–1.030)
WBC, UA: >50 WBC/hpf — ABNORMAL HIGH (ref 0–5)
pH: 5 (ref 5.0–8.0)

## 2018-05-23 ENCOUNTER — Other Ambulatory Visit: Payer: Self-pay | Admitting: *Deleted

## 2018-05-23 ENCOUNTER — Ambulatory Visit: Admitting: Obstetrics & Gynecology

## 2018-05-23 ENCOUNTER — Encounter: Payer: Self-pay | Admitting: Obstetrics & Gynecology

## 2018-05-23 ENCOUNTER — Encounter: Payer: Self-pay | Admitting: Oncology

## 2018-05-23 DIAGNOSIS — D509 Iron deficiency anemia, unspecified: Secondary | ICD-10-CM

## 2018-05-23 DIAGNOSIS — R319 Hematuria, unspecified: Secondary | ICD-10-CM

## 2018-05-23 LAB — PATHOLOGY REPORT

## 2018-05-23 LAB — TISSUE, 5 SPECIMENS

## 2018-05-23 LAB — CELIAC DISEASE PANEL
Endomysial Ab, IgA: NEGATIVE
IgA: 224 mg/dL (ref 87–352)
Tissue Transglutaminase Ab, IgA: <2 U/mL (ref 0–3)

## 2018-05-23 NOTE — Progress Notes (Signed)
I called patient to see if she was on her menstrual cycle when she gave urine specimen. She says she was not but she did have uterus bx the day before and was having so drainage/blood from that.

## 2018-05-23 NOTE — Progress Notes (Signed)
I connected with Sierra [name] on 05/23/18 at  9:30 AM EDT by video enabled telemedicine visit and verified that I am speaking with the correct person using two identifiers.   I discussed the limitations, risks, security and privacy concerns of performing an evaluation and management service by telemedicine and the availability of in-person appointments. I also discussed with the patient that there may be a patient responsible charge related to this service. The patient expressed understanding and agreed to proceed.  Other persons participating in the visit and their role in the encounter:  none  Patient's location:  home Provider's location:  home  Reason for referral iron deficiency anemia Referring provider Franco Nonesheryl Lindley, FNP  History of present illness: Patient is a 38 year old premenopausal female with a history of gastric bypass duodenal switch surgery back in 2018.  She has been referred to us for iron deficiency anemia.  Patient also reports relatively heavy menstrual cycles.  Although her cycles last for about 5 days the first 2 to 3 days are particularly heavy.  She has seen GYN for this and has an IUD in place but menstrual cycles continue to be heavy.  She currently reports feeling fatigued.  Denies any consistent use of NSAIDs.  Denies any history of colon cancer.  She was given a prescription for iron tablets which could not be fulfilled by her insurance.  She has been taking bariatric multivitamins at this time.     Review of Systems  Constitutional: Positive for malaise/fatigue. Negative for chills, fever and weight loss.  HENT: Negative for congestion, ear discharge and nosebleeds.   Eyes: Negative for blurred vision.  Respiratory: Negative for cough, hemoptysis, sputum production, shortness of breath and wheezing.   Cardiovascular: Negative for chest pain, palpitations, orthopnea and claudication.  Gastrointestinal: Negative for abdominal pain, blood in stool,  constipation, diarrhea, heartburn, melena, nausea and vomiting.  Genitourinary: Negative for dysuria, flank pain, frequency, hematuria and urgency.  Musculoskeletal: Negative for back pain, joint pain and myalgias.  Skin: Negative for rash.  Neurological: Negative for dizziness, tingling, focal weakness, seizures, weakness and headaches.  Endo/Heme/Allergies: Does not bruise/bleed easily.  Psychiatric/Behavioral: Negative for depression and suicidal ideas. The patient does not have insomnia.     Allergies  Allergen Reactions  . Molds & Smuts   . Pollen Extract   . Uncaria Tomentosa (Cats Claw)   . Milk-Related Compounds     Able to tolerate yogurt; mild intolerance to milk/cheese  . Pineapple   . Soy Allergy   . Strawberry Extract     Break out on tongue noted    Past Medical History:  Diagnosis Date  . Abdominal pain, other specified site   . 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 Anemia   . Anxiety state, unspecified   . Bipolar 1 disorder, depressed (HCC) 05/21/2017  . BIPOLAR AFFECTIVE DISORDER 12/23/2006   Qualifier: Diagnosis of  By: Ermalene SearingBedsole MD, Amy    . Bipolar affective disorder (HCC) 10/12/2011  . Bipolar disorder, unspecified (HCC)   . Depressive disorder, not elsewhere classified   . Edema   . Esophageal reflux   . Fibromyalgia   . Headache   . History of kidney stones   . Mild or unspecified pre-eclampsia, unspecified as to episode of care   . Morbid obesity (HCC)   . Myalgia and myositis, unspecified   . Other dyspnea and respiratory abnormality   . Preeclampsia    with first pregnancy  . Raynaud's disease   . Urinary  tract infection, site not specified     Past Surgical History:  Procedure Laterality Date  . CESAREAN SECTION     X 2  . CHOLECYSTECTOMY  03/2006  . CYSTOSCOPY W/ URETERAL STENT PLACEMENT Left 05/14/2016   Procedure: CYSTOSCOPY WITH STENT REPLACEMENT;  Surgeon: [name], MD;  Location: [facility];  Service: Urology;  Laterality:  Left;  . CYSTOSCOPY WITH STENT PLACEMENT Left 04/25/2016   Procedure: CYSTOSCOPY WITH STENT PLACEMENT;  Surgeon: [name], MD;  Location: [facility];  Service: Urology;  Laterality: Left;  . DILATION AND CURETTAGE OF UTERUS    . IVC FILTER INSERTION  03/26/2016   [facility]  . LAPAROSCOPIC GASTRIC RESTRICTIVE DUODENAL PROCEDURE (DUODENAL SWITCH)  03/2016  . LITHOTRIPSY    . TONSILLECTOMY AND ADENOIDECTOMY    . URETEROSCOPY WITH HOLMIUM LASER LITHOTRIPSY Left 05/14/2016   Procedure: URETEROSCOPY WITH HOLMIUM LASER LITHOTRIPSY;  Surgeon: [name], MD;  Location: [facility];  Service: Urology;  Laterality: Left;    Social History   Socioeconomic History  . Marital status: Married    Spouse name: brian  . Number of children: 2  . Years of education: Not on file  . Highest education level: Some college, no degree  Occupational History  . Occupation: Arts development officer  Social Needs  . Financial resource strain: Not hard at all  . Food insecurity:    Worry: Never true    Inability: Never true  . Transportation needs:    Medical: No    Non-medical: No  Tobacco Use  . Smoking status: Never Smoker  . Smokeless tobacco: Never Used  Substance and Sexual Activity  . Alcohol use: Yes    Alcohol/week: 2.0 - 3.0 standard drinks    Types: 2 Glasses of wine per week  . Drug use: No  . Sexual activity: Not Currently    Partners: Male    Birth control/protection: None, I.U.D.  Lifestyle  . Physical activity:    Days per week: 0 days    Minutes per session: 0 min  . Stress: To some extent  Relationships  . Social connections:    Talks on phone: More than three times a week    Gets together: Once a week    Attends religious service: Never    Active member of club or organization: No    Attends meetings of clubs or organizations: Never    Relationship status: Married  . Intimate partner violence:    Fear of current or ex partner: No    Emotionally abused: No    Physically abused:  No    Forced sexual activity: No  Other Topics Concern  . Not on file  Social History Narrative   1 child, 2 step-sons      Regular exercise-no   Diet: no fast food, likes sweets    Family History  Problem Relation Age of Onset  . Depression Father   . Alcohol abuse Father   . Depression Mother   . Hyperlipidemia Mother   . Sleep apnea Mother   . Depression Sister   . Hyperlipidemia Brother   . Depression Brother   . Hyperlipidemia Brother   . Depression Brother   . Alcohol abuse Brother   . Coronary artery disease Maternal Grandmother   . Heart attack Maternal Grandmother   . Diabetes Maternal Grandmother   . Lung cancer Maternal Grandfather   . Emphysema Maternal Grandfather   . Coronary artery disease Maternal Grandfather   . Lupus Other  Aunt  . Fibromyalgia Other        Aunt     Current Outpatient Medications:  .  ARIPiprazole (ABILIFY) 30 MG tablet, Take 1 tablet (30 mg total) by mouth daily., Disp: 90 tablet, Rfl: 0 .  Biotin 5000 MCG TABS, Take 1 tablet by mouth daily., Disp: , Rfl:  .  buPROPion (WELLBUTRIN XL) 300 MG 24 hr tablet, Take 300 mg by mouth daily., Disp: , Rfl:  .  calcium citrate-vitamin D 500-400 MG-UNIT chewable tablet, Chew 1 tablet by mouth daily. , Disp: , Rfl:  .  eszopiclone (LUNESTA) 2 MG TABS tablet, Take 1 tablet (2 mg total) by mouth at bedtime. Take immediately before bedtime, Disp: 30 tablet, Rfl: 1 .  hydrOXYzine (ATARAX/VISTARIL) 25 MG tablet, Take 1 tablet (25 mg total) by mouth daily as needed for anxiety., Disp: 90 tablet, Rfl: 1 .  lamoTRIgine (LAMICTAL) 100 MG tablet, Take 1 tablet (100 mg total) by mouth daily., Disp: 90 tablet, Rfl: 1 .  lamoTRIgine (LAMICTAL) 25 MG tablet, Take 2 tablets (50 mg total) by mouth daily. To be added to 100 mg daily, Disp: 180 tablet, Rfl: 0 .  lithium carbonate (LITHOBID) 300 MG CR tablet, TAKE 1 TABLET BY MOUTH EVERY DAY WITH SUPPER, Disp: 90 tablet, Rfl: 0 .  montelukast (SINGULAIR) 10 MG  tablet, Take 10 mg by mouth daily., Disp: , Rfl:  .  Multiple Vitamin (MULTIVITAMIN) tablet, Take 1 tablet by mouth daily., Disp: , Rfl:  .  NIFEdipine (PROCARDIA-XL/ADALAT CC) 60 MG 24 hr tablet, Take 60 mg by mouth daily. , Disp: , Rfl:  .  omeprazole (PRILOSEC) 20 MG capsule, Take 20 mg by mouth daily., Disp: , Rfl:  .  PARAGARD INTRAUTERINE COPPER IUD IUD, by Intrauterine route., Disp: , Rfl:  .  Biotin 5 MG TBDP, Take by mouth., Disp: , Rfl:   No results found.  No images are attached to the encounter.   CMP Latest Ref Rng & Units 09/07/2017  Glucose 70 - 99 mg/dL 95  BUN 6 - 20 mg/dL 12  Creatinine 1.61 - 0.96 mg/dL 0.45  Sodium 409 - 811 mmol/L 140  Potassium 3.5 - 5.1 mmol/L 4.4  Chloride 98 - 111 mmol/L 110  CO2 22 - 32 mmol/L 26  Calcium 8.9 - 10.3 mg/dL 9.2  Total Protein 6.5 - 8.1 g/dL -  Total Bilirubin 0.3 - 1.2 mg/dL -  Alkaline Phos 38 - 914 U/L -  AST 15 - 41 U/L -  ALT 14 - 54 U/L -   CBC Latest Ref Rng & Units 05/21/2018  WBC 4.0 - 10.5 K/uL 6.3  Hemoglobin 12.0 - 15.0 g/dL 78.2  Hematocrit 95.6 - 46.0 % 37.5  Platelets 150 - 400 K/uL 294     Observation/objective: Appears in no acute distress over her video visit today.  Breathing is nonlabored  Assessment and plan: Patient is a 38 year old female referred for iron deficiency anemia possibly secondary to gastric bypass and or menorrhagia  Patient's recent labs done in March 2020 showed H&H of 11.3/36.  Ferritin levels were low at 5.8 and iron saturation low at 7%.  Although patient has evidence of iron deficiency she does not have significant anemia.  At this time I will check CBC ferritin and iron studies, TSH, B12 and folate, celiac disease panel urinalysis and stool H. pylori antigen.  Given that her anemia is not severe and due to her ongoing covert pandemic I have recommended that she should try  oral iron 325 mg twice a day.  We will repeat her CBC Wilson and iron studies in 6 weeks time after a trial of  oral iron and if did not improve I will consider IV iron at that time.  Also if patient notices any significant side effects from oral iron such as constipation or abdominal pain we can switch her to IV iron at that time.  Discussed risks and benefits of IV iron including all but not limited to headache, leg swelling and possible risk of infusion reaction.  If she needs IV iron I would recommend Feraheme 510 mg x 2 doses weekly.  Follow-up instructions: Follow-up in 6 weeks with labs followed by video visit  I discussed the assessment and treatment plan with the patient. The patient was provided an opportunity to ask questions and all were answered. The patient agreed with the plan and demonstrated an understanding of the instructions.   The patient was advised to call back or seek an in-person evaluation if the symptoms worsen or if the condition fails to improve as anticipated.   Visit Diagnosis: 1. Iron deficiency anemia, unspecified iron deficiency anemia type     Dr. Owens Shark, MD, MPH [facility] Pager- [phone] 05/23/2018 9:08 AM

## 2018-05-23 NOTE — Patient Instructions (Signed)
1. 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  Colposcopy done today, well-tolerated with no complication.  Colposcopy findings discussed with patient.  STD screening done with chlamydia and gonorrhea. - Pathology Report ([facility]) - C. trachomatis/N. gonorrhoeae RNA  2. 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 trachomatis/N. gonorrhoeae RNA  3. Breakthrough bleeding associated with intrauterine device (IUD) Breakthrough bleeding could be associated with intrauterine device.  Rule out gonorrhea and chlamydia. - C. trachomatis/N. gonorrhoeae RNA  Sierra [name], it was a pleasure seeing you today!  I will inform you of your results as soon as they are available.

## 2018-05-24 LAB — H. PYLORI ANTIGEN, STOOL: H. Pylori Stool Ag, Eia: NEGATIVE

## 2018-05-29 ENCOUNTER — Telehealth: Payer: Self-pay | Admitting: *Deleted

## 2018-05-29 NOTE — Telephone Encounter (Signed)
Patient called stating her hematologist is worried that paraguard IUD may be causes issues with cycle related to anemia. Patient asked if she could possible have Mirena IUD placed and paraguard removed next week during LEEP procedure. I explained this would have to do at separate visit. Patient will folllow up with Dr.lavoie regarding this.

## 2018-06-02 ENCOUNTER — Telehealth: Payer: Self-pay | Admitting: *Deleted

## 2018-06-02 NOTE — Telephone Encounter (Signed)
Patient called stating she sees her results in MyChart and has some concerns about the results and would like for Dr Smith Robert to call her and let her know if she needs to be concerned or not.  [phone].

## 2018-06-04 NOTE — Telephone Encounter (Signed)
I called the patient

## 2018-06-06 ENCOUNTER — Ambulatory Visit: Admitting: Obstetrics & Gynecology

## 2018-06-12 ENCOUNTER — Other Ambulatory Visit: Payer: Self-pay

## 2018-06-12 ENCOUNTER — Encounter: Payer: Self-pay | Admitting: Psychiatry

## 2018-06-12 ENCOUNTER — Ambulatory Visit (INDEPENDENT_AMBULATORY_CARE_PROVIDER_SITE_OTHER): Admitting: Psychiatry

## 2018-06-12 DIAGNOSIS — F900 Attention-deficit hyperactivity disorder, predominantly inattentive type: Secondary | ICD-10-CM

## 2018-06-12 DIAGNOSIS — F3160 Bipolar disorder, current episode mixed, unspecified: Secondary | ICD-10-CM

## 2018-06-12 DIAGNOSIS — F411 Generalized anxiety disorder: Secondary | ICD-10-CM

## 2018-06-12 DIAGNOSIS — F5105 Insomnia due to other mental disorder: Secondary | ICD-10-CM | POA: Diagnosis not present

## 2018-06-12 DIAGNOSIS — F5081 Binge eating disorder: Secondary | ICD-10-CM

## 2018-06-12 MED ORDER — LISDEXAMFETAMINE DIMESYLATE 20 MG PO CAPS: 20.0000 mg | ORAL_CAPSULE | Freq: Every day | ORAL | 0 refills | Status: DC

## 2018-06-12 MED ORDER — LITHIUM CARBONATE ER 300 MG PO TBCR: EXTENDED_RELEASE_TABLET | ORAL | 1 refills | Status: DC

## 2018-06-12 NOTE — Progress Notes (Signed)
Virtual Visit via Video Note  I connected with Sierra [name] on 06/12/18 at  9:30 AM EDT by a video enabled telemedicine application and verified that I am speaking with the correct person using two identifiers.   I discussed the limitations of evaluation and management by telemedicine and the availability of in person appointments. The patient expressed understanding and agreed to proceed.   I discussed the assessment and treatment plan with the patient. The patient was provided an opportunity to ask questions and all were answered. The patient agreed with the plan and demonstrated an understanding of the instructions.   The patient was advised to call back or seek an in-person evaluation if the symptoms worsen or if the condition fails to improve as anticipated.   BH MD OP Progress Note  06/12/2018 12:48 PM MAHALIA DYKES  MRN:  [id number]  Chief Complaint:  Chief Complaint    Follow-up     HPI: Kamillah is a 38 year old Caucasian female, married, lives in [place], has a history of bipolar disorder, generalized anxiety disorder, binge eating disorder was evaluated by telemedicine today.  Patient today reports she feels better with regards to her mood.  She reports she is compliant on her Abilify, Wellbutrin and lamotrigine.  She reports the lamotrigine may be helping with her mood symptoms.  She denies any side effects to the medications.  She reports sleep is good on the [place].  She reports her home atmosphere continues to be chaotic.  She however reports she is trying to cope as best as she can.  She has started psychotherapy sessions again and has an upcoming appointment today with her therapist.  Patient reports she had ADHD testing with Dr. Lovenia Kim.  I have reviewed and discussed ADHD testing report with patient today.  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  She also has significant symptoms of central auditory processing disorder  which likely accounts for a lot of her struggles with background noise and difficulty processing verbal information.  We discussed that her chronic sleep deprived patient can also slow processing speed and make it much more difficult to pay attention.  All comorbid problem and any associated medications were discussed and considered in the final plan.  A urine drug screen was completed and showed compliance with Lamictal and Wellbutrin but was negative for Abilify.  Lithium and Lunesta were not assessed.  I do recommend addressing the negative result for Abilify since noncompliance with that could be playing a part in her insomnia if she is currently in a manic episode.  The [place] controlled substance reporting system was reviewed.  We discussed that it seems likely she has the diagnosis but it is difficult to determine with certainty at this time.  I think it is very appropriate for her to have a trial of long-acting stimulant medication for ADHD.  I recommend starting with a low dose of Vyvanse and titrating up every 1 to 2 weeks while closely monitoring her symptoms and irritability.  If those worsen then a long acting methylphenidate would be my second choice for her.'  Discussed the possible noncompliance with Abilify with patient today.  She however denies it.  She reports she has been taking her medications as prescribed.  We will continue to reassess and encourage patient.  Patient denies any suicidality, homicidality or perceptual disturbances. Visit Diagnosis:    ICD-10-CM   1. Bipolar I disorder, most recent episode mixed (HCC) F31.60 lithium carbonate (LITHOBID) 300  MG CR tablet  2. GAD (generalized anxiety disorder) F41.1   3. Attention deficit hyperactivity disorder (ADHD), predominantly inattentive type F90.0 lisdexamfetamine (VYVANSE) 20 MG capsule  4. Insomnia due to mental condition F51.05   5. Binge eating disorder F50.81     Past Psychiatric History: I have reviewed past  psychiatric history from my progress note on 06/20/2017.  Past Medical History:  Past Medical History:  Diagnosis Date  . Abdominal pain, other specified site   . 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 Anemia   . Anxiety state, unspecified   . Bipolar 1 disorder, depressed (HCC) 05/21/2017  . BIPOLAR AFFECTIVE DISORDER 12/23/2006   Qualifier: Diagnosis of  By: Ermalene Searing MD, Amy    . Bipolar affective disorder (HCC) 10/12/2011  . Bipolar disorder, unspecified (HCC)   . Depressive disorder, not elsewhere classified   . Edema   . Esophageal reflux   . Fibromyalgia   . Headache   . History of kidney stones   . Mild or unspecified pre-eclampsia, unspecified as to episode of care   . Morbid obesity (HCC)   . Myalgia and myositis, unspecified   . Other dyspnea and respiratory abnormality   . Preeclampsia    with first pregnancy  . Raynaud's disease   . Urinary tract infection, site not specified     Past Surgical History:  Procedure Laterality Date  . CESAREAN SECTION     X 2  . CHOLECYSTECTOMY  03/2006  . CYSTOSCOPY W/ URETERAL STENT PLACEMENT Left 05/14/2016   Procedure: CYSTOSCOPY WITH STENT REPLACEMENT;  Surgeon: [name], MD;  Location: [facility];  Service: Urology;  Laterality: Left;  . CYSTOSCOPY WITH STENT PLACEMENT Left 04/25/2016   Procedure: CYSTOSCOPY WITH STENT PLACEMENT;  Surgeon: [name], MD;  Location: [facility];  Service: Urology;  Laterality: Left;  . DILATION AND CURETTAGE OF UTERUS    . IVC FILTER INSERTION  03/26/2016   [facility]  . LAPAROSCOPIC GASTRIC RESTRICTIVE DUODENAL PROCEDURE (DUODENAL SWITCH)  03/2016  . LITHOTRIPSY    . TONSILLECTOMY AND ADENOIDECTOMY    . URETEROSCOPY WITH HOLMIUM LASER LITHOTRIPSY Left 05/14/2016   Procedure: URETEROSCOPY WITH HOLMIUM LASER LITHOTRIPSY;  Surgeon: [name], MD;  Location: [facility];  Service: Urology;  Laterality: Left;    Family Psychiatric History: Reviewed family psychiatric history from my  progress note on 06/20/2017.  Family History:  Family History  Problem Relation Age of Onset  . Depression Father   . Alcohol abuse Father   . Depression Mother   . Hyperlipidemia Mother   . Sleep apnea Mother   . Depression Sister   . Hyperlipidemia Brother   . Depression Brother   . Hyperlipidemia Brother   . Depression Brother   . Alcohol abuse Brother   . Coronary artery disease Maternal Grandmother   . Heart attack Maternal Grandmother   . Diabetes Maternal Grandmother   . Lung cancer Maternal Grandfather   . Emphysema Maternal Grandfather   . Coronary artery disease Maternal Grandfather   . Lupus Other        Aunt  . Fibromyalgia Other        Aunt    Social History: Reviewed social history from my progress note on 06/20/2017. Social History   Socioeconomic History  . Marital status: Married    Spouse name: brian  . Number of children: 2  . Years of education: Not on file  . Highest education level: Some college, no degree  Occupational History  .  Occupation: Arts development officer  Social Needs  . Financial resource strain: Not hard at all  . Food insecurity:    Worry: Never true    Inability: Never true  . Transportation needs:    Medical: No    Non-medical: No  Tobacco Use  . Smoking status: Never Smoker  . Smokeless tobacco: Never Used  Substance and Sexual Activity  . Alcohol use: Yes    Alcohol/week: 2.0 - 3.0 standard drinks    Types: 2 Glasses of wine per week  . Drug use: No  . Sexual activity: Not Currently    Partners: Male    Birth control/protection: None, I.U.D.  Lifestyle  . Physical activity:    Days per week: 0 days    Minutes per session: 0 min  . Stress: To some extent  Relationships  . Social connections:    Talks on phone: More than three times a week    Gets together: Once a week    Attends religious service: Never    Active member of club or organization: No    Attends meetings of clubs or organizations: Never    Relationship status:  Married  Other Topics Concern  . Not on file  Social History Narrative   1 child, 2 step-sons      Regular exercise-no   Diet: no fast food, likes sweets    Allergies:  Allergies  Allergen Reactions  . Molds & Smuts   . Pollen Extract   . Uncaria Tomentosa (Cats Claw)   . Milk-Related Compounds     Able to tolerate yogurt; mild intolerance to milk/cheese  . Pineapple   . Soy Allergy   . Strawberry Extract     Break out on tongue noted    Metabolic Disorder Labs: Lab Results  Component Value Date   HGBA1C 4.7 (L) 05/22/2017   MPG 88.19 05/22/2017   No results found for: PROLACTIN Lab Results  Component Value Date   CHOL 105 05/22/2017   TRIG 41 05/22/2017   HDL 48 05/22/2017   CHOLHDL 2.2 05/22/2017   VLDL 8 05/22/2017   LDLCALC 49 05/22/2017   LDLCALC 99 09/11/2013   Lab Results  Component Value Date   TSH 0.504 05/21/2018   TSH 0.677 05/22/2017    Therapeutic Level Labs: Lab Results  Component Value Date   LITHIUM 0.3 (L) 11/26/2017   LITHIUM 0.3 (L) 11/18/2017   No results found for: VALPROATE No components found for:  CBMZ  Current Medications: Current Outpatient Medications  Medication Sig Dispense Refill  . ARIPiprazole (ABILIFY) 30 MG tablet Take 1 tablet (30 mg total) by mouth daily. 90 tablet 0  . Biotin 5 MG TBDP Take by mouth.    . Biotin 5000 MCG TABS Take 1 tablet by mouth daily.    Marland Kitchen buPROPion (WELLBUTRIN XL) 300 MG 24 hr tablet Take 300 mg by mouth daily.    . calcium citrate-vitamin D 500-400 MG-UNIT chewable tablet Chew 1 tablet by mouth daily.     . eszopiclone (LUNESTA) 2 MG TABS tablet Take 1 tablet (2 mg total) by mouth at bedtime. Take immediately before bedtime 30 tablet 1  . hydrOXYzine (ATARAX/VISTARIL) 25 MG tablet Take 1 tablet (25 mg total) by mouth daily as needed for anxiety. 90 tablet 1  . lamoTRIgine (LAMICTAL) 100 MG tablet Take 1 tablet (100 mg total) by mouth daily. 90 tablet 1  . lamoTRIgine (LAMICTAL) 25 MG tablet  Take 2 tablets (50 mg total) by mouth daily.  To be added to 100 mg daily 180 tablet 0  . lisdexamfetamine (VYVANSE) 20 MG capsule Take 1 capsule (20 mg total) by mouth daily at 6 (six) AM. 30 capsule 0  . lithium carbonate (LITHOBID) 300 MG CR tablet TAKE 1 TABLET BY MOUTH EVERY DAY WITH SUPPER 90 tablet 1  . montelukast (SINGULAIR) 10 MG tablet Take 10 mg by mouth daily.    . Multiple Vitamin (MULTIVITAMIN) tablet Take 1 tablet by mouth daily.    Marland Kitchen NIFEdipine (PROCARDIA-XL/ADALAT CC) 60 MG 24 hr tablet Take 60 mg by mouth daily.     Marland Kitchen omeprazole (PRILOSEC) 20 MG capsule Take 20 mg by mouth daily.    Marland Kitchen PARAGARD INTRAUTERINE COPPER IUD IUD by Intrauterine route.     No current facility-administered medications for this visit.      Musculoskeletal: Strength & Muscle Tone: within normal limits Gait & Station: normal Patient leans: N/A  Psychiatric Specialty Exam: Review of Systems  Psychiatric/Behavioral: The patient is nervous/anxious.   All other systems reviewed and are negative.   There were no vitals taken for this visit.There is no height or weight on file to calculate BMI.  General Appearance: Casual  Eye Contact:  Fair  Speech:  Clear and Coherent  Volume:  Normal  Mood:  Anxious improving  Affect:  Congruent  Thought Process:  Goal Directed and Descriptions of Associations: Intact  Orientation:  Full (Time, Place, and Person)  Thought Content: Logical   Suicidal Thoughts:  No  Homicidal Thoughts:  No  Memory:  Immediate;   Fair Recent;   Fair Remote;   Fair  Judgement:  Fair  Insight:  Fair  Psychomotor Activity:  Normal  Concentration:  Concentration: Fair and Attention Span: Fair  Recall:  [employer] of Knowledge: Fair  Language: Fair  Akathisia:  No  Handed:  Right  AIMS (if indicated): Denies tremors, rigidity, stiffness  Assets:  Communication Skills Desire for Improvement Social Support  ADL's:  Intact  Cognition: WNL  Sleep:  Fair    Screenings: AIMS     Office Visit from 12/11/2017 in [facility] Office Visit from 11/29/2017 in [facility] Office Visit from 11/20/2017 in [facility] Admission (Discharged) from 05/21/2017 in [facility]  AIMS Total Score  1  7  5   0    AUDIT     Admission (Discharged) from 05/21/2017 in [facility]  Alcohol Use Disorder Identification Test Final Score (AUDIT)  6       Assessment and Plan: Sierra [name] is a 39 year old Caucasian female who has a history of bipolar disorder, anxiety disorder, binge eating disorder was evaluated by telemedicine today.  Patient is currently making progress on the current medication regimen with regards to her mood symptoms.  She had ADHD testing done recently and the testing reports were reviewed and discussed.  She will will benefit from stimulant medication.  Discussed plan as noted below.  Plan For bipolar disorder-improving Lamictal 150 mg p.o. daily Lithium 300 mg p.o. nightly-reduced dosage Lithium level on 11/19/2017-0.3-subtherapeutic.  Patient had tremors on higher dosage Abilify 30 mg p.o. daily Wellbutrin XL 150 mg p.o. daily.  For anxiety disorder-improving Hydroxyzine as needed She will continue to work with her therapist.  For insomnia-improving Lunesta 2 mg p.o. nightly  For ADHD inattentive type-unstable I have reviewed and discussed ADHD testing report per Dr. Lovenia Kim at [facility] attention specialist dated May 27, 2018 with patient. Meets  criteria for ADHD inattentive type.  She also may have central auditory processing disorder and may benefit from audiology referral at some point.  Discussed and reviewed testing report with patient today as summarized above. We will start Vyvanse 20 mg p.o. daily in the morning I have provided medication education and also discussed benefits, risk and also controlled  substance policy.   For binge eating disorder-in remission We will continue to monitor closely.  Follow-up in clinic in 3 to 4 weeks or sooner if needed.  Appointment scheduled for June 30 at 10:15 AM.  Discussed with patient to call the clinic with concerns.  I have spent atleast 25 minutes non face to face with patient today. More than 50 % of the time was spent for psychoeducation and supportive psychotherapy and care coordination.  This note was generated in part or whole with voice recognition software. Voice recognition is usually quite accurate but there are transcription errors that can and very often do occur. I apologize for any typographical errors that were not detected and corrected.       Jomarie LongsSaramma Yanitza Shvartsman, MD 06/12/2018, 12:48 PM

## 2018-06-16 ENCOUNTER — Other Ambulatory Visit: Payer: Self-pay

## 2018-06-17 ENCOUNTER — Ambulatory Visit: Admitting: Obstetrics & Gynecology

## 2018-06-28 IMAGING — CR DG CHEST 2V
1 series · 2 of 2 positions shown · non-contrast
Comparison: 10/14/2015

CLINICAL DATA: Chest tightness and chest pain radiating to the jaw
and left arm beginning this morning.

EXAM:
CHEST  2 VIEW

[Series 1: dg chest 2 view · 0.14mm/px · 2 of 2 slices shown]
[im 1/2]
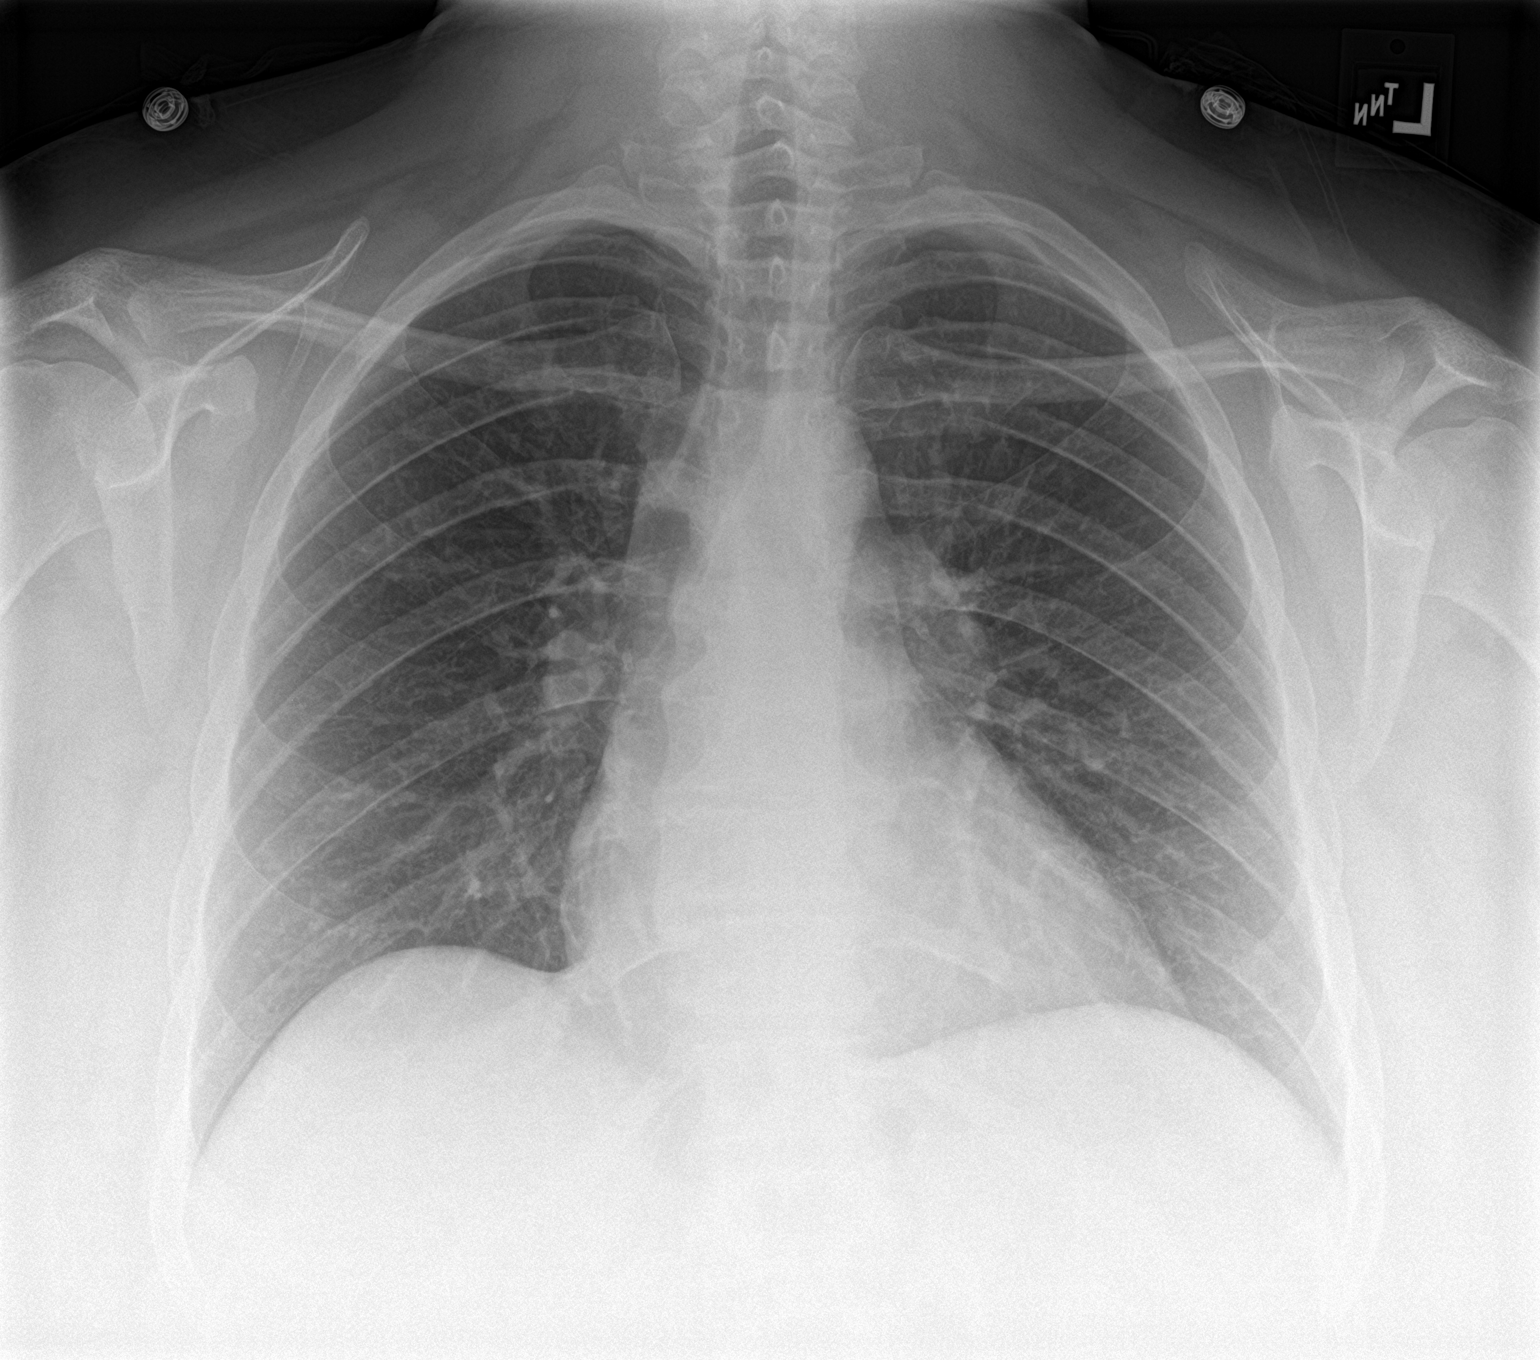
[im 2/2]
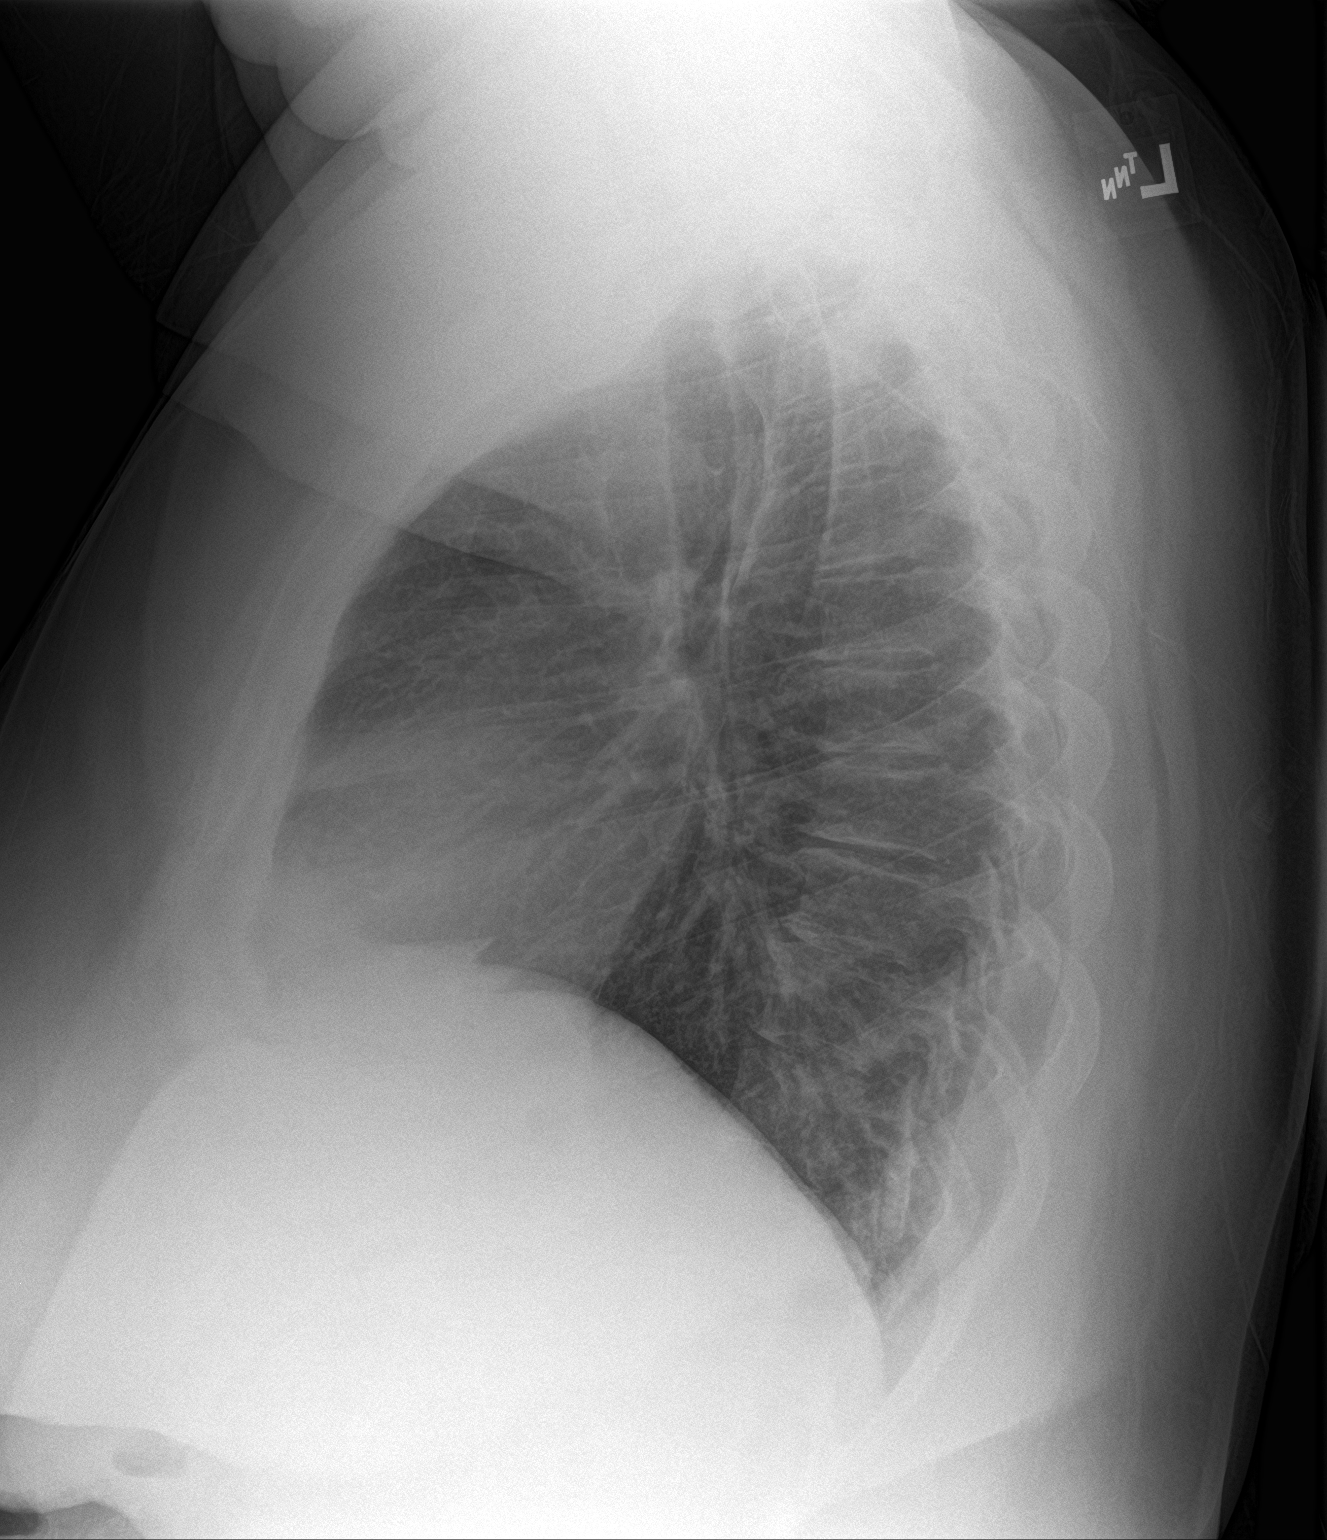

[2 of 2 positions shown; findings below may reference images not displayed]

FINDINGS: Heart size is normal. Mediastinal shadows are normal. The lungs are
clear. No bronchial thickening. No infiltrate, mass, effusion or
collapse. Pulmonary vascularity is normal. No bony abnormality.
IMPRESSION: Normal chest

## 2018-06-30 ENCOUNTER — Other Ambulatory Visit: Payer: Self-pay

## 2018-07-01 ENCOUNTER — Other Ambulatory Visit: Payer: Self-pay

## 2018-07-01 ENCOUNTER — Inpatient Hospital Stay: Attending: Oncology

## 2018-07-01 DIAGNOSIS — N920 Excessive and frequent menstruation with regular cycle: Secondary | ICD-10-CM | POA: Insufficient documentation

## 2018-07-01 DIAGNOSIS — F319 Bipolar disorder, unspecified: Secondary | ICD-10-CM | POA: Insufficient documentation

## 2018-07-01 DIAGNOSIS — D500 Iron deficiency anemia secondary to blood loss (chronic): Secondary | ICD-10-CM | POA: Diagnosis not present

## 2018-07-01 DIAGNOSIS — F909 Attention-deficit hyperactivity disorder, unspecified type: Secondary | ICD-10-CM | POA: Insufficient documentation

## 2018-07-01 DIAGNOSIS — Z79899 Other long term (current) drug therapy: Secondary | ICD-10-CM | POA: Insufficient documentation

## 2018-07-01 DIAGNOSIS — Z9884 Bariatric surgery status: Secondary | ICD-10-CM | POA: Diagnosis not present

## 2018-07-01 DIAGNOSIS — R319 Hematuria, unspecified: Secondary | ICD-10-CM

## 2018-07-01 DIAGNOSIS — D509 Iron deficiency anemia, unspecified: Secondary | ICD-10-CM

## 2018-07-01 LAB — URINALYSIS, COMPLETE (UACMP) WITH MICROSCOPIC
Bilirubin Urine: NEGATIVE
Glucose, UA: NEGATIVE mg/dL
Ketones, ur: NEGATIVE mg/dL
Nitrite: NEGATIVE
Protein, ur: NEGATIVE mg/dL
Specific Gravity, Urine: 1.015 (ref 1.005–1.030)
WBC, UA: >50 WBC/hpf — ABNORMAL HIGH (ref 0–5)
pH: 5 (ref 5.0–8.0)

## 2018-07-01 LAB — IRON AND TIBC
Iron: 49 ug/dL (ref 28–170)
Saturation Ratios: 13 % (ref 10.4–31.8)
TIBC: 378 ug/dL (ref 250–450)
UIBC: 329 ug/dL

## 2018-07-01 LAB — CBC WITH DIFFERENTIAL/PLATELET
Abs Immature Granulocytes: 0.03 10*3/uL (ref 0.00–0.07)
Basophils Absolute: 0.1 10*3/uL (ref 0.0–0.1)
Basophils Relative: 1 %
Eosinophils Absolute: 0.1 10*3/uL (ref 0.0–0.5)
Eosinophils Relative: 2 %
HCT: 38 % (ref 36.0–46.0)
Hemoglobin: 12.6 g/dL (ref 12.0–15.0)
Immature Granulocytes: 0 %
Lymphocytes Relative: 18 %
Lymphs Abs: 1.3 10*3/uL (ref 0.7–4.0)
MCH: 28.9 pg (ref 26.0–34.0)
MCHC: 33.2 g/dL (ref 30.0–36.0)
MCV: 87.2 fL (ref 80.0–100.0)
Monocytes Absolute: 0.5 10*3/uL (ref 0.1–1.0)
Monocytes Relative: 7 %
Neutro Abs: 5.3 10*3/uL (ref 1.7–7.7)
Neutrophils Relative %: 72 %
Platelets: 301 10*3/uL (ref 150–400)
RBC: 4.36 MIL/uL (ref 3.87–5.11)
RDW: 14.3 % (ref 11.5–15.5)
WBC: 7.3 10*3/uL (ref 4.0–10.5)
nRBC: 0 % (ref 0.0–0.2)

## 2018-07-01 LAB — FERRITIN: Ferritin: 16 ng/mL (ref 11–307)

## 2018-07-03 ENCOUNTER — Encounter: Payer: Self-pay | Admitting: Oncology

## 2018-07-03 ENCOUNTER — Inpatient Hospital Stay (HOSPITAL_BASED_OUTPATIENT_CLINIC_OR_DEPARTMENT_OTHER): Admitting: Oncology

## 2018-07-03 DIAGNOSIS — N920 Excessive and frequent menstruation with regular cycle: Secondary | ICD-10-CM

## 2018-07-03 DIAGNOSIS — D500 Iron deficiency anemia secondary to blood loss (chronic): Secondary | ICD-10-CM

## 2018-07-03 DIAGNOSIS — D509 Iron deficiency anemia, unspecified: Secondary | ICD-10-CM

## 2018-07-03 NOTE — Progress Notes (Signed)
I connected with Sierra [name] on 07/03/18 at  9:30 AM EDT by video enabled telemedicine visit and verified that I am speaking with the correct person using two identifiers.   I discussed the limitations, risks, security and privacy concerns of performing an evaluation and management service by telemedicine and the availability of in-person appointments. I also discussed with the patient that there may be a patient responsible charge related to this service. The patient expressed understanding and agreed to proceed.  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  She has been referred to us for iron deficiency anemia.  Patient also reports relatively heavy menstrual cycles.  Although her cycles last for about 5 days the first 2 to 3 days are particularly heavy.  She has seen GYN for this and has an IUD in place but menstrual cycles continue to be heavy.  She currently reports feeling fatigued.  Denies any consistent use of NSAIDs.  Denies any history of colon cancer.  She was given a prescription for iron tablets which could not be fulfilled by her insurance.  She has been taking bariatric multivitamins at this time.  Results of blood work from 05/21/2018 were as follows: CBC showed white count of 6.3, H&H of 12.2/37.5 and a platelet count of 294.  Ferritin levels were low at 8.  Iron study showed an elevated TIBC of 459 and a low iron saturation of 8%.  B12 and folate were normal.  Celiac disease panel was unremarkable.  TSH was normal.  We did have a repeat to rounds of urinalysis given the patient was having some menstrual cycles  in the morning analysis from 07/01/2018 did not reveal any hematuria.  H. pylori stool antigen was negative.  Interval history patient was started on adhd medication and she has been taking po iron since last 6 weeks. She reports some self limited constipation but otherwise tolerating it well   Review of Systems  Constitutional: Negative for chills, fever, malaise/fatigue and weight loss.  HENT: Negative for congestion, ear discharge and nosebleeds.   Eyes: Negative for blurred vision.  Respiratory: Negative for cough, hemoptysis, sputum production, shortness of breath and wheezing.   Cardiovascular: Negative for chest pain, palpitations, orthopnea and claudication.  Gastrointestinal: Negative for abdominal pain, blood in stool, constipation, diarrhea, heartburn, melena, nausea and vomiting.  Genitourinary: Negative for dysuria, flank pain, frequency, hematuria and urgency.  Musculoskeletal: Negative for back pain, joint pain and myalgias.  Skin: Negative for rash.  Neurological: Negative for dizziness, tingling, focal weakness, seizures, weakness and headaches.  Endo/Heme/Allergies: Does not bruise/bleed easily.  Psychiatric/Behavioral: Negative for depression and suicidal ideas. The patient does not have insomnia.     Allergies  Allergen Reactions  . Molds & Smuts   . Pollen Extract   . Uncaria Tomentosa (Cats Claw)   . Milk-Related Compounds     Able to tolerate yogurt; mild intolerance to milk/cheese  . Pineapple   . Soy Allergy   . Strawberry Extract     Break out on tongue noted    Past Medical History:  Diagnosis Date  . Abdominal pain, other specified site   .  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 Anemia   . Anxiety state, unspecified   . Bipolar 1 disorder, depressed (HCC) 05/21/2017  . BIPOLAR AFFECTIVE DISORDER 12/23/2006   Qualifier: Diagnosis of  By: Ermalene SearingBedsole MD, Amy    . Bipolar affective disorder (HCC) 10/12/2011  . Bipolar disorder, unspecified (HCC)   .  Depressive disorder, not elsewhere classified   . Edema   . Esophageal reflux   . Fibromyalgia   . Headache   . History of kidney stones   . Mild or unspecified pre-eclampsia, unspecified as to episode of care   . Morbid obesity (HCC)   . Myalgia and myositis, unspecified   . Other dyspnea and respiratory abnormality   . Preeclampsia    with first pregnancy  . Raynaud's disease   . Urinary tract infection, site not specified     Past Surgical History:  Procedure Laterality Date  . CESAREAN SECTION     X 2  . CHOLECYSTECTOMY  03/2006  . CYSTOSCOPY W/ URETERAL STENT PLACEMENT Left 05/14/2016   Procedure: CYSTOSCOPY WITH STENT REPLACEMENT;  Surgeon: Vanna ScotlandAshley Brandon, MD;  Location: [facility];  Service: Urology;  Laterality: Left;  . CYSTOSCOPY WITH STENT PLACEMENT Left 04/25/2016   Procedure: CYSTOSCOPY WITH STENT PLACEMENT;  Surgeon: Vanna ScotlandAshley Brandon, MD;  Location: [facility];  Service: Urology;  Laterality: Left;  . DILATION AND CURETTAGE OF UTERUS    . IVC FILTER INSERTION  03/26/2016   [facility]  . LAPAROSCOPIC GASTRIC RESTRICTIVE DUODENAL PROCEDURE (DUODENAL SWITCH)  03/2016  . LITHOTRIPSY    . TONSILLECTOMY AND ADENOIDECTOMY    . URETEROSCOPY WITH HOLMIUM LASER LITHOTRIPSY Left 05/14/2016   Procedure: URETEROSCOPY WITH HOLMIUM LASER LITHOTRIPSY;  Surgeon: Vanna ScotlandAshley Brandon, MD;  Location: [facility];  Service: Urology;  Laterality: Left;    Social History   Socioeconomic History  . Marital status: Married    Spouse name: brian  . Number of children: 2  . Years of education: Not on file  . Highest education level: Some college, no degree  Occupational History  . Occupation: Arts development officerHome maker  Social Needs  . Financial resource strain: Not hard at all  . Food insecurity    Worry: Never true    Inability: Never true  . Transportation needs    Medical: No    Non-medical: No  Tobacco Use  . Smoking status: Never Smoker  . Smokeless tobacco: Never Used  Substance and Sexual Activity   . Alcohol use: Yes    Alcohol/week: 2.0 - 3.0 standard drinks    Types: 2 Glasses of wine per week  . Drug use: No  . Sexual activity: Not Currently    Partners: Male    Birth control/protection: None, I.U.D.  Lifestyle  . Physical activity    Days per week: 0 days    Minutes per session: 0 min  . Stress: To some extent  Relationships  . Social connections    Talks on phone: More than three times a week    Gets together: Once a week    Attends religious service: Never    Active member of club or organization: No    Attends meetings of clubs or organizations: Never    Relationship status: Married  . Intimate partner violence    Fear of current or ex partner: No    Emotionally abused: No    Physically abused: No    Forced sexual activity: No  Other Topics Concern  . Not on file  Social History  Narrative   1 child, 2 step-sons      Regular exercise-no   Diet: no fast food, likes sweets    Family History  Problem Relation Age of Onset  . Depression Father   . Alcohol abuse Father   . Depression Mother   . Hyperlipidemia Mother   . Sleep apnea Mother   . Depression Sister   . Hyperlipidemia Brother   . Depression Brother   . Hyperlipidemia Brother   . Depression Brother   . Alcohol abuse Brother   . Coronary artery disease Maternal Grandmother   . Heart attack Maternal Grandmother   . Diabetes Maternal Grandmother   . Lung cancer Maternal Grandfather   . Emphysema Maternal Grandfather   . Coronary artery disease Maternal Grandfather   . Lupus Other        Aunt  . Fibromyalgia Other        Aunt     Current Outpatient Medications:  .  ARIPiprazole (ABILIFY) 30 MG tablet, Take 1 tablet (30 mg total) by mouth daily., Disp: 90 tablet, Rfl: 0 .  Biotin 5 MG TBDP, Take by mouth., Disp: , Rfl:  .  Biotin 5000 MCG TABS, Take 1 tablet by mouth daily., Disp: , Rfl:  .  buPROPion (WELLBUTRIN XL) 300 MG 24 hr tablet, Take 300 mg by mouth daily., Disp: , Rfl:  .   calcium citrate-vitamin D 500-400 MG-UNIT chewable tablet, Chew 1 tablet by mouth daily. , Disp: , Rfl:  .  eszopiclone (LUNESTA) 2 MG TABS tablet, Take 1 tablet (2 mg total) by mouth at bedtime. Take immediately before bedtime, Disp: 30 tablet, Rfl: 1 .  hydrOXYzine (ATARAX/VISTARIL) 25 MG tablet, Take 1 tablet (25 mg total) by mouth daily as needed for anxiety., Disp: 90 tablet, Rfl: 1 .  lamoTRIgine (LAMICTAL) 100 MG tablet, Take 1 tablet (100 mg total) by mouth daily., Disp: 90 tablet, Rfl: 1 .  lamoTRIgine (LAMICTAL) 25 MG tablet, Take 2 tablets (50 mg total) by mouth daily. To be added to 100 mg daily, Disp: 180 tablet, Rfl: 0 .  lisdexamfetamine (VYVANSE) 20 MG capsule, Take 1 capsule (20 mg total) by mouth daily at 6 (six) AM., Disp: 30 capsule, Rfl: 0 .  lithium carbonate (LITHOBID) 300 MG CR tablet, TAKE 1 TABLET BY MOUTH EVERY DAY WITH SUPPER, Disp: 90 tablet, Rfl: 1 .  montelukast (SINGULAIR) 10 MG tablet, Take 10 mg by mouth daily., Disp: , Rfl:  .  Multiple Vitamin (MULTIVITAMIN) tablet, Take 1 tablet by mouth daily., Disp: , Rfl:  .  NIFEdipine (PROCARDIA-XL/ADALAT CC) 60 MG 24 hr tablet, Take 60 mg by mouth daily. , Disp: , Rfl:  .  omeprazole (PRILOSEC) 20 MG capsule, Take 20 mg by mouth daily., Disp: , Rfl:  .  PARAGARD INTRAUTERINE COPPER IUD IUD, by Intrauterine route., Disp: , Rfl:   No results found.  No images are attached to the encounter.   CMP Latest Ref Rng & Units 09/07/2017  Glucose 70 - 99 mg/dL 95  BUN 6 - 20 mg/dL 12  Creatinine 1.610.44 - 0.961.00 mg/dL 0.450.73  Sodium 409135 - 811145 mmol/L 140  Potassium 3.5 - 5.1 mmol/L 4.4  Chloride 98 - 111 mmol/L 110  CO2 22 - 32 mmol/L 26  Calcium 8.9 - 10.3 mg/dL 9.2  Total Protein 6.5 - 8.1 g/dL -  Total Bilirubin 0.3 - 1.2 mg/dL -  Alkaline Phos 38 - 914126 U/L -  AST 15 - 41 U/L -  ALT  14 - 54 U/L -   CBC Latest Ref Rng & Units 07/01/2018  WBC 4.0 - 10.5 K/uL 7.3  Hemoglobin 12.0 - 15.0 g/dL 12.6  Hematocrit 36.0 - 46.0 %  38.0  Platelets 150 - 400 K/uL 301     Observation/objective:appears in no acute distress over video visit today. 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 After 6 weeks of oral iron iron studies have improved but ferritin remains low at 16. I gave her the option of oral versus IV iron at this time. She wishes to continue oral iron which I think is reasonable.  Follow-up instructions:repeat cbc ferritin and iron studies in 2 and 4 months. See Dr. Janese Banks in 4 months  I discussed the assessment and treatment plan with the patient. The patient was provided an opportunity to ask questions and all were answered. The patient agreed with the plan and demonstrated an understanding of the instructions.   The patient was advised to call back or seek an in-person evaluation if the symptoms worsen or if the condition fails to improve as anticipated.   Visit Diagnosis: 1. Iron deficiency anemia, unspecified iron deficiency anemia type     Dr. Randa Evens, MD, MPH [facility] at [facility] Pager- [phone] 07/03/2018 9:29 AM

## 2018-07-03 NOTE — Progress Notes (Signed)
Pt states that she feels better now and she is not sure if it was iv iron tx or the new med for ADHD. She does have constipation from oral iron pills and got a stool softner and it is fine now

## 2018-07-08 ENCOUNTER — Encounter: Payer: Self-pay | Admitting: Obstetrics & Gynecology

## 2018-07-08 ENCOUNTER — Other Ambulatory Visit: Payer: Self-pay

## 2018-07-08 ENCOUNTER — Ambulatory Visit (INDEPENDENT_AMBULATORY_CARE_PROVIDER_SITE_OTHER): Admitting: Obstetrics & Gynecology

## 2018-07-08 DIAGNOSIS — N920 Excessive and frequent menstruation with regular cycle: Secondary | ICD-10-CM

## 2018-07-08 DIAGNOSIS — N871 Moderate cervical dysplasia: Secondary | ICD-10-CM | POA: Diagnosis not present

## 2018-07-08 NOTE — Progress Notes (Signed)
    Sierra [name] 1980/08/04 [id number]        38 y.o.  [id number]  Boyfriend  RP: CIN 1-2 for LEEP and Menorrhagia  HPI: Menorrhagia with low Iron and dysmenorrhea.  No desire to preserve fertility.  Would like to proceed with hysterectomy and not do the LEEP today.   OB History  Gravida Para Term Preterm AB Living  5 2     2 2   SAB TAB Ectopic Multiple Live Births  2            # Outcome Date GA Lbr Len/2nd Weight Sex Delivery Anes PTL Lv  5 Gravida           4 SAB           3 SAB           2 Para           1 Para             Past medical history,surgical history, problem list, medications, allergies, family history and social history were all reviewed and documented in the EPIC chart.   Directed ROS with pertinent positives and negatives documented in the history of present illness/assessment and plan.  Exam:  There were no vitals filed for this visit. General appearance:  Normal  Gyn exam deferred   Assessment/Plan:  38 y.o. [id number]   1. Dysplasia of cervix, high grade CIN 2 Decision to proceed with LAVH/Bilateral Salpingectomy.  Given CIN 1-2, agreed with patient that a LEEP prior to hysterectomy was not necessary.  2. Menorrhagia with regular cycle Patient declined all hormonal forms of treatment for Menorrhagia.  No desire to preserve fertility.  Decision to proceed with LAVH/Bilateral Salpingectomy.  Will f/u with a Pelvic US at [place] visit. - US Transvaginal Non-OB; Future  Counseling on above issues and coordination of care more than 50% for 25 minutes.  Princess Bruins MD, 12:23 PM 07/08/2018

## 2018-07-11 ENCOUNTER — Telehealth: Payer: Self-pay

## 2018-07-11 ENCOUNTER — Encounter: Payer: Self-pay | Admitting: Obstetrics & Gynecology

## 2018-07-11 NOTE — Patient Instructions (Signed)
1. Dysplasia of cervix, high grade CIN 2 Decision to proceed with LAVH/Bilateral Salpingectomy.  Given CIN 1-2, agreed with patient that a LEEP prior to hysterectomy was not necessary.  2. Menorrhagia with regular cycle Patient declined all hormonal forms of treatment for Menorrhagia.  No desire to preserve fertility.  Decision to proceed with LAVH/Bilateral Salpingectomy.  Will f/u with a Pelvic US at [place] visit. - US Transvaginal Non-OB; Future  Sierra [name], it was a pleasure seeing you today!

## 2018-07-11 NOTE — Telephone Encounter (Signed)
I spoke with patient about scheduling surgery. I reviewed her insurance benefits and advised her regarding her estimated surgery prepymt due by one week prior to surgery. I informed her that I am waiting on the August schedule and will call her as soon as I receive it and get her scheduled.  She was in agreement with all of this.

## 2018-07-14 ENCOUNTER — Other Ambulatory Visit: Payer: Self-pay

## 2018-07-14 ENCOUNTER — Ambulatory Visit (INDEPENDENT_AMBULATORY_CARE_PROVIDER_SITE_OTHER): Admitting: Psychiatry

## 2018-07-14 ENCOUNTER — Encounter: Payer: Self-pay | Admitting: Psychiatry

## 2018-07-14 DIAGNOSIS — F5105 Insomnia due to other mental disorder: Secondary | ICD-10-CM | POA: Diagnosis not present

## 2018-07-14 DIAGNOSIS — F411 Generalized anxiety disorder: Secondary | ICD-10-CM | POA: Diagnosis not present

## 2018-07-14 DIAGNOSIS — F3160 Bipolar disorder, current episode mixed, unspecified: Secondary | ICD-10-CM

## 2018-07-14 DIAGNOSIS — F5081 Binge eating disorder: Secondary | ICD-10-CM

## 2018-07-14 DIAGNOSIS — F900 Attention-deficit hyperactivity disorder, predominantly inattentive type: Secondary | ICD-10-CM | POA: Diagnosis not present

## 2018-07-14 MED ORDER — BUPROPION HCL 75 MG PO TABS: 75.0000 mg | ORAL_TABLET | Freq: Every morning | ORAL | 0 refills | Status: DC

## 2018-07-14 MED ORDER — ESZOPICLONE 2 MG PO TABS: 2.0000 mg | ORAL_TABLET | Freq: Every day | ORAL | 1 refills | Status: DC

## 2018-07-14 MED ORDER — LAMOTRIGINE 100 MG PO TABS: 100.0000 mg | ORAL_TABLET | Freq: Every day | ORAL | 0 refills | Status: DC

## 2018-07-14 MED ORDER — ARIPIPRAZOLE 30 MG PO TABS: 30.0000 mg | ORAL_TABLET | Freq: Every day | ORAL | 0 refills | Status: DC

## 2018-07-14 MED ORDER — LAMOTRIGINE 25 MG PO TABS: 50.0000 mg | ORAL_TABLET | Freq: Every day | ORAL | 0 refills | Status: DC

## 2018-07-14 MED ORDER — LISDEXAMFETAMINE DIMESYLATE 20 MG PO CAPS: 20.0000 mg | ORAL_CAPSULE | Freq: Every day | ORAL | 0 refills | Status: DC

## 2018-07-14 NOTE — Progress Notes (Signed)
Virtual Visit via Video Note  I connected with Sierra [name] on 07/14/18 at  8:30 AM EDT by a video enabled telemedicine application and verified that I am speaking with the correct person using two identifiers.   I discussed the limitations of evaluation and management by telemedicine and the availability of in person appointments. The patient expressed understanding and agreed to proceed.     I discussed the assessment and treatment plan with the patient. The patient was provided an opportunity to ask questions and all were answered. The patient agreed with the plan and demonstrated an understanding of the instructions.   The patient was advised to call back or seek an in-person evaluation if the symptoms worsen or if the condition fails to improve as anticipated.  [place] MD OP Progress Note  07/14/2018 11:19 AM Sierra [name]  MRN:  [id number]  Chief Complaint:  Chief Complaint    Follow-up     HPI: [name] is a 38 year old Caucasian female, married, lives in [place], has a history of bipolar disorder, GAD, binge eating disorder, ADHD was evaluated by telemedicine today.  Patient reports she is tolerating her medications well.  She is compliant on Abilify, Wellbutrin, lamotrigine and Vyvanse.  She denies any side effects.  She reports mood symptoms are stable.  She reports her attention and concentration has improved and she feels less fatigue or tired.  She denies any manic or hypomanic episodes.  She reports sleep is good.  She denies any other concerns today.  Visit Diagnosis:    ICD-10-CM   1. Bipolar I disorder, most recent episode mixed (HCC)  F31.60 ARIPiprazole (ABILIFY) 30 MG tablet    buPROPion (WELLBUTRIN) 75 MG tablet    lamoTRIgine (LAMICTAL) 100 MG tablet    lamoTRIgine (LAMICTAL) 25 MG tablet  2. GAD (generalized anxiety disorder)  F41.1 buPROPion (WELLBUTRIN) 75 MG tablet  3. Attention deficit hyperactivity disorder (ADHD), predominantly inattentive  type  F90.0 lisdexamfetamine (VYVANSE) 20 MG capsule  4. Insomnia due to mental condition  F51.05 eszopiclone (LUNESTA) 2 MG TABS tablet  5. Binge eating disorder  F50.81     Past Psychiatric History: Reviewed past psychiatric history from my progress note on 06/20/2017.  Past Medical History:  Past Medical History:  Diagnosis Date  . Abdominal pain, other specified site   . ADHD   . Affective bipolar disorder ([place]) 10/12/2011  . Anemia   . Anxiety state, unspecified   . Bipolar 1 disorder, depressed ([place]) 05/21/2017  . BIPOLAR AFFECTIVE DISORDER 12/23/2006   Qualifier: Diagnosis of  By: Diona Browner MD, Amy    . Bipolar affective disorder ([place]) 10/12/2011  . Bipolar disorder, unspecified ([place])   . Depressive disorder, not elsewhere classified   . Edema   . Esophageal reflux   . Fibromyalgia   . Headache   . History of kidney stones   . Mild or unspecified pre-eclampsia, unspecified as to episode of care   . Morbid obesity ([place])   . Myalgia and myositis, unspecified   . Other dyspnea and respiratory abnormality   . Preeclampsia    with first pregnancy  . Raynaud's disease   . Urinary tract infection, site not specified     Past Surgical History:  Procedure Laterality Date  . CESAREAN SECTION     X 2  . CHOLECYSTECTOMY  03/2006  . CYSTOSCOPY W/ URETERAL STENT PLACEMENT Left 05/14/2016   Procedure: CYSTOSCOPY WITH STENT REPLACEMENT;  Surgeon: Hollice Espy, MD;  Location: [facility] OR[facility];  Service: Urology;  Laterality: Left;  . CYSTOSCOPY WITH STENT PLACEMENT Left 04/25/2016   Procedure: CYSTOSCOPY WITH STENT PLACEMENT;  Surgeon: Vanna ScotlandAshley Brandon, MD;  Location: [facility];  Service: Urology;  Laterality: Left;  . DILATION AND CURETTAGE OF UTERUS    . IVC FILTER INSERTION  03/26/2016   [facility]  . LAPAROSCOPIC GASTRIC RESTRICTIVE DUODENAL PROCEDURE (DUODENAL SWITCH)  03/2016  . LITHOTRIPSY    . TONSILLECTOMY AND ADENOIDECTOMY    . URETEROSCOPY WITH HOLMIUM LASER LITHOTRIPSY Left 05/14/2016    Procedure: URETEROSCOPY WITH HOLMIUM LASER LITHOTRIPSY;  Surgeon: Vanna ScotlandAshley Brandon, MD;  Location: [facility];  Service: Urology;  Laterality: Left;    Family Psychiatric History: Have reviewed family psychiatric history from my progress note on 06/20/2017  Family History:  Family History  Problem Relation Age of Onset  . Depression Father   . Alcohol abuse Father   . Depression Mother   . Hyperlipidemia Mother   . Sleep apnea Mother   . Depression Sister   . Hyperlipidemia Brother   . Depression Brother   . Hyperlipidemia Brother   . Depression Brother   . Alcohol abuse Brother   . Coronary artery disease Maternal Grandmother   . Heart attack Maternal Grandmother   . Diabetes Maternal Grandmother   . Lung cancer Maternal Grandfather   . Emphysema Maternal Grandfather   . Coronary artery disease Maternal Grandfather   . Lupus Other        Aunt  . Fibromyalgia Other        Aunt    Social History: Have reviewed social history from my progress note on 06/20/2017 Social History   Socioeconomic History  . Marital status: Married    Spouse name: brian  . Number of children: 2  . Years of education: Not on file  . Highest education level: Some college, no degree  Occupational History  . Occupation: Arts development officerHome maker  Social Needs  . Financial resource strain: Not hard at all  . Food insecurity    Worry: Never true    Inability: Never true  . Transportation needs    Medical: No    Non-medical: No  Tobacco Use  . Smoking status: Never Smoker  . Smokeless tobacco: Never Used  Substance and Sexual Activity  . Alcohol use: Yes    Alcohol/week: 2.0 - 3.0 standard drinks    Types: 2 Glasses of wine per week  . Drug use: No  . Sexual activity: Not Currently    Partners: Male    Birth control/protection: None, I.U.D.  Lifestyle  . Physical activity    Days per week: 0 days    Minutes per session: 0 min  . Stress: To some extent  Relationships  . Social connections    Talks on  phone: More than three times a week    Gets together: Once a week    Attends religious service: Never    Active member of club or organization: No    Attends meetings of clubs or organizations: Never    Relationship status: Married  Other Topics Concern  . Not on file  Social History Narrative   1 child, 2 step-sons      Regular exercise-no   Diet: no fast food, likes sweets    Allergies:  Allergies  Allergen Reactions  . Molds & Smuts   . Pollen Extract   . Uncaria Tomentosa (Cats Claw)   . Milk-Related Compounds     Able to tolerate yogurt; mild intolerance to milk/cheese  .  Pineapple   . Soy Allergy   . Strawberry Extract     Break out on tongue noted    Metabolic Disorder Labs: Lab Results  Component Value Date   HGBA1C 4.7 (L) 05/22/2017   MPG 88.19 05/22/2017   No results found for: PROLACTIN Lab Results  Component Value Date   CHOL 105 05/22/2017   TRIG 41 05/22/2017   HDL 48 05/22/2017   CHOLHDL 2.2 05/22/2017   VLDL 8 05/22/2017   LDLCALC 49 05/22/2017   LDLCALC 99 09/11/2013   Lab Results  Component Value Date   TSH 0.504 05/21/2018   TSH 0.677 05/22/2017    Therapeutic Level Labs: Lab Results  Component Value Date   LITHIUM 0.3 (L) 11/26/2017   LITHIUM 0.3 (L) 11/18/2017   No results found for: VALPROATE No components found for:  CBMZ  Current Medications: Current Outpatient Medications  Medication Sig Dispense Refill  . ARIPiprazole (ABILIFY) 30 MG tablet Take 1 tablet (30 mg total) by mouth daily. 90 tablet 0  . Biotin 5000 MCG TABS Take 1 tablet by mouth daily.    Marland Kitchen. buPROPion (WELLBUTRIN) 75 MG tablet Take 1 tablet (75 mg total) by mouth every morning. 90 tablet 0  . calcium citrate-vitamin D 500-400 MG-UNIT chewable tablet Chew 1 tablet by mouth daily.     Marland Kitchen. docusate sodium (COLACE) 100 MG capsule Take 300 mg by mouth daily.    . eszopiclone (LUNESTA) 2 MG TABS tablet Take 1 tablet (2 mg total) by mouth at bedtime. Take immediately  before bedtime 30 tablet 1  . ferrous sulfate 325 (65 FE) MG tablet Take 325 mg by mouth daily with breakfast.    . hydrOXYzine (ATARAX/VISTARIL) 25 MG tablet Take 1 tablet (25 mg total) by mouth daily as needed for anxiety. 90 tablet 1  . lamoTRIgine (LAMICTAL) 100 MG tablet Take 1 tablet (100 mg total) by mouth daily. 90 tablet 0  . lamoTRIgine (LAMICTAL) 25 MG tablet Take 2 tablets (50 mg total) by mouth daily. To be added to 100 mg daily 180 tablet 0  . lisdexamfetamine (VYVANSE) 20 MG capsule Take 1 capsule (20 mg total) by mouth daily at 6 (six) AM. 30 capsule 0  . lithium carbonate (LITHOBID) 300 MG CR tablet TAKE 1 TABLET BY MOUTH EVERY DAY WITH SUPPER 90 tablet 1  . montelukast (SINGULAIR) 10 MG tablet Take 10 mg by mouth daily.    . Multiple Vitamin (MULTIVITAMIN) tablet Take 1 tablet by mouth daily.    Marland Kitchen. NIFEdipine (PROCARDIA-XL/ADALAT CC) 60 MG 24 hr tablet Take 60 mg by mouth daily.     Marland Kitchen. omeprazole (PRILOSEC) 20 MG capsule Take 20 mg by mouth daily.    Marland Kitchen. PARAGARD INTRAUTERINE COPPER IUD IUD by Intrauterine route.     No current facility-administered medications for this visit.      Musculoskeletal: Strength & Muscle Tone: within normal limits Gait & Station: normal Patient leans: N/A  Psychiatric Specialty Exam: Review of Systems  Psychiatric/Behavioral: Negative for depression. The patient is not nervous/anxious.   All other systems reviewed and are negative.   There were no vitals taken for this visit.There is no height or weight on file to calculate BMI.  General Appearance: Casual  Eye Contact:  Fair  Speech:  Normal Rate  Volume:  Normal  Mood:  Euthymic  Affect:  Congruent  Thought Process:  Goal Directed and Descriptions of Associations: Intact  Orientation:  Full (Time, Place, and Person)  Thought Content: Logical  Suicidal Thoughts:  No  Homicidal Thoughts:  No  Memory:  Immediate;   Fair Recent;   Fair Remote;   Fair  Judgement:  Fair  Insight:   Fair  Psychomotor Activity:  Normal  Concentration:  Concentration: Fair and Attention Span: Fair  Recall:  FiservFair  Fund of Knowledge: Fair  Language: Fair  Akathisia:  No  Handed:  Right  AIMS (if indicated): Denies tremors, rigidity, stiffness  Assets:  Communication Skills Desire for Improvement Social Support  ADL's:  Intact  Cognition: WNL  Sleep:  Fair   Screenings: AIMS     Office Visit from 12/11/2017 in [facility] Office Visit from 11/29/2017 [facility] Office Visit from [facility]/20/2017 in [facility]Dis[facility]  AIMS Total Score  1  7  5   0    AUDIT     [facility]  Alcohol Use Disorder Identification Test Final Score (AUDIT)  6       Assessment and Plan: Trula [name] is a 38 year old Caucasian female who has a history of bipolar disorder, anxiety disorder, binge eating disorder was evaluated by telemedicine today.  Patient is currently making progress on the current medication regimen.  She is tolerating the Vyvanse well and reports ADHD symptoms is improving.  Plan Bipolar disorder-improving Lamotrigine 150 mg p.o. daily Lithium 300 mg p.o. nightly-reduced dosage Lithium level on 11/19/2017-0.3-subtherapeutic Abilify 30 mg p.o. daily Reduce Wellbutrin to 75 mg p.o. daily now that she is on Vyvanse.  For anxiety disorder-improving Hydroxyzine as needed   For insomnia-improving Lunesta 2 mg p.o. nightly  For ADHD inattentive type-improving Vyvanse 20 mg p.o. daily in the morning I have reviewed [place] controlled substance database.  For binge eating disorder-in remission We will continue to monitor closely.  Follow-up in clinic in 1 month or sooner if needed.  August 10 at 8:30 AM  I have spent atleast 15 minutes non face to face with patient today. More  than 50 % of the time was spent for psychoeducation and supportive psychotherapy and care coordination.  This note was generated in part or whole with voice recognition software. Voice recognition is usually quite accurate but there are transcription errors that can and very often do occur. I apologize for any typographical errors that were not detected and corrected.          Jomarie LongsSaramma Mathilde Mcwherter, MD 07/14/2018, 11:19 AM

## 2018-07-15 ENCOUNTER — Telehealth: Payer: Self-pay

## 2018-07-15 ENCOUNTER — Ambulatory Visit: Admitting: Psychiatry

## 2018-07-15 NOTE — Telephone Encounter (Signed)
Patient called me back and we discussed available dates for August.  We had previously discussed her ins benefits and est financial prepymt due. SHe plans to pay this when she comes for pre op and u/s on 7/16. A note was added to appt notes.  I scheduled her for Weds, Aug 19 at 9:00am at [facility].  Covid screen was scheduled on the Sat before at 9am. She was advised to quarantine after the Covid test until surgery only going out for medical appointment/needs. She agreed with this.

## 2018-07-15 NOTE — Telephone Encounter (Signed)
Left message to call me regarding scheduling surgery.

## 2018-07-22 ENCOUNTER — Telehealth: Payer: Self-pay

## 2018-07-22 NOTE — Telephone Encounter (Signed)
Patient came for LEEP but had decided she wanted hysterectomy.  I scheduled her 09/01/2018. She called today to say she has changed her mind.  She said you told her she could have a biopsy (I assume LEEP) and recheck in 4 mos.  She also said you mentioned changing her IUD to a different IUD. I did not see that in your note and just wondered what the new IUD would be so that Abigail Butts can check her cost for her.

## 2018-07-22 NOTE — Telephone Encounter (Signed)
Schedule Pelvic US with removal of Paragard IUD/Mirena IUD insertion.  Will repeat a Colpo 08/2018 at 3 months.

## 2018-07-22 NOTE — Telephone Encounter (Signed)
I spoke with patient. She does want to cancel surgery. Surgery was cancelled. She was scheduled for 8/8 for colpo. She knows I sent Abigail Butts a message asking her to check benefits/cost for u/s with removal of Paragard IUD and insertion of Mirena.  Also, I Kristeen Miss about what her balance is.  I will call her when I hear.

## 2018-07-31 ENCOUNTER — Ambulatory Visit: Admitting: Obstetrics & Gynecology

## 2018-07-31 ENCOUNTER — Other Ambulatory Visit

## 2018-08-11 ENCOUNTER — Telehealth: Payer: Self-pay

## 2018-08-11 NOTE — Telephone Encounter (Signed)
pt called left message that she needs a refill on her medication to get enough until appt.

## 2018-08-11 NOTE — Telephone Encounter (Signed)
Patient has enough medications for now.

## 2018-08-18 ENCOUNTER — Telehealth: Payer: Self-pay

## 2018-08-18 DIAGNOSIS — F900 Attention-deficit hyperactivity disorder, predominantly inattentive type: Secondary | ICD-10-CM

## 2018-08-18 MED ORDER — LISDEXAMFETAMINE DIMESYLATE 20 MG PO CAPS: 20.0000 mg | ORAL_CAPSULE | Freq: Every day | ORAL | 0 refills | Status: DC

## 2018-08-18 NOTE — Telephone Encounter (Signed)
Sent Vyvanse to pharmacy. 

## 2018-08-18 NOTE — Telephone Encounter (Signed)
Received a fax from pharmacy requesting a refill on vyvanse pt has appt on  08-25-18    lisdexamfetamine (VYVANSE) 20 MG capsule Medication Date: 07/14/2018 Department: [facility] Ordering/Authorizing: Ursula Alert, MD  Order Providers  Prescribing Provider Encounter Provider  Ursula Alert, MD Ursula Alert, MD  Outpatient Medication Detail   Disp Refills Start End   lisdexamfetamine (VYVANSE) 20 MG capsule 30 capsule 0 07/14/2018    Sig - Route: Take 1 capsule (20 mg total) by mouth daily at 6 (six) AM. - Oral   Sent to pharmacy as: lisdexamfetamine (VYVANSE) 20 MG capsule   Earliest Fill Date: 07/14/2018   E-Prescribing Status: Receipt confirmed by pharmacy (07/14/2018 8:44 AM EDT)   Associated Diagnoses  Attention deficit hyperactivity disorder (ADHD), predominantly inattentive type

## 2018-08-25 ENCOUNTER — Encounter: Payer: Self-pay | Admitting: Psychiatry

## 2018-08-25 ENCOUNTER — Ambulatory Visit (INDEPENDENT_AMBULATORY_CARE_PROVIDER_SITE_OTHER): Admitting: Psychiatry

## 2018-08-25 ENCOUNTER — Other Ambulatory Visit: Payer: Self-pay

## 2018-08-25 DIAGNOSIS — F3160 Bipolar disorder, current episode mixed, unspecified: Secondary | ICD-10-CM

## 2018-08-25 DIAGNOSIS — F5105 Insomnia due to other mental disorder: Secondary | ICD-10-CM | POA: Diagnosis not present

## 2018-08-25 DIAGNOSIS — F411 Generalized anxiety disorder: Secondary | ICD-10-CM | POA: Diagnosis not present

## 2018-08-25 DIAGNOSIS — F900 Attention-deficit hyperactivity disorder, predominantly inattentive type: Secondary | ICD-10-CM

## 2018-08-25 DIAGNOSIS — F5081 Binge eating disorder: Secondary | ICD-10-CM

## 2018-08-25 MED ORDER — LISDEXAMFETAMINE DIMESYLATE 20 MG PO CAPS: 20.0000 mg | ORAL_CAPSULE | Freq: Every day | ORAL | 0 refills | Status: DC

## 2018-08-25 MED ORDER — ESZOPICLONE 2 MG PO TABS: 2.0000 mg | ORAL_TABLET | Freq: Every day | ORAL | 1 refills | Status: DC

## 2018-08-25 NOTE — Progress Notes (Signed)
Virtual Visit via Video Note  I connected with Sierra [name] on 08/25/18 at  8:30 AM EDT by a video enabled telemedicine application and verified that I am speaking with the correct person using two identifiers.   I discussed the limitations of evaluation and management by telemedicine and the availability of in person appointments. The patient expressed understanding and agreed to proceed.     I discussed the assessment and treatment plan with the patient. The patient was provided an opportunity to ask questions and all were answered. The patient agreed with the plan and demonstrated an understanding of the instructions.   The patient was advised to call back or seek an in-person evaluation if the symptoms worsen or if the condition fails to improve as anticipated.  BH MD OP Progress Note  08/25/2018 3:44 PM Sierra [name]  MRN:  [id number]  Chief Complaint:  Chief Complaint    Follow-up     HPI: Sierra [name] is a 38 year old Caucasian female, married, lives in [place], has a history of GAD, bipolar disorder, binge eating disorder, ADHD was evaluated by telemedicine today.  Patient today reports she is currently doing well on the current medication regimen.  She denies any significant mood lability or anxiety symptoms.  She reports sleep is good on the [place]unesta.  She however reports she has to take it every night and she has noticed a big difference when she does not take it.  She reports attention and focus is good on the Vyvanse.  She denies any side effects.  Patient denies any suicidality, homicidality or perceptual disturbances.  Patient denies any other concerns today.   Visit Diagnosis:    ICD-10-CM   1. Bipolar I disorder, most recent episode mixed (HCC)  F31.60   2. GAD (generalized anxiety disorder)  F41.1   3. Attention deficit hyperactivity disorder (ADHD), predominantly inattentive type  F90.0 lisdexamfetamine (VYVANSE) 20 MG capsule  4. Insomnia due to  mental condition  F51.05 eszopiclone (LUNESTA) 2 MG TABS tablet  5. Binge eating disorder  F50.81     Past Psychiatric History: I have reviewed past psychiatric history from my progress note on 06/20/2017.  Past Medical History:  Past Medical History:  Diagnosis Date  . Abdominal pain, other specified site   . ADHD   . 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 Anemia   . Anxiety state, unspecified   . Bipolar 1 disorder, depressed (HCC) 05/21/2017  . BIPOLAR AFFECTIVE DISORDER 12/23/2006   Qualifier: Diagnosis of  By: Ermalene SearingBedsole MD, Amy    . Bipolar affective disorder (HCC) 10/12/2011  . Bipolar disorder, unspecified (HCC)   . Depressive disorder, not elsewhere classified   . Edema   . Esophageal reflux   . Fibromyalgia   . Headache   . History of kidney stones   . Mild or unspecified pre-eclampsia, unspecified as to episode of care   . Morbid obesity (HCC)   . Myalgia and myositis, unspecified   . Other dyspnea and respiratory abnormality   . Preeclampsia    with first pregnancy  . Raynaud's disease   . Urinary tract infection, site not specified     Past Surgical History:  Procedure Laterality Date  . CESAREAN SECTION     X 2  . CHOLECYSTECTOMY  03/2006  . CYSTOSCOPY W/ URETERAL STENT PLACEMENT Left 05/14/2016   Procedure: CYSTOSCOPY WITH STENT REPLACEMENT;  Surgeon: Vanna ScotlandAshley Brandon, MD;  Location: [facility];  Service: Urology;  Laterality: Left;  . CYSTOSCOPY WITH STENT  PLACEMENT Left 04/25/2016   Procedure: CYSTOSCOPY WITH STENT PLACEMENT;  Surgeon: Vanna ScotlandAshley Brandon, MD;  Location: [facility];  Service: Urology;  Laterality: Left;  . DILATION AND CURETTAGE OF UTERUS    . IVC FILTER INSERTION  03/26/2016   [facility]  . LAPAROSCOPIC GASTRIC RESTRICTIVE DUODENAL PROCEDURE (DUODENAL SWITCH)  03/2016  . LITHOTRIPSY    . TONSILLECTOMY AND ADENOIDECTOMY    . URETEROSCOPY WITH HOLMIUM LASER LITHOTRIPSY Left 05/14/2016   Procedure: URETEROSCOPY WITH HOLMIUM LASER LITHOTRIPSY;   Surgeon: Vanna ScotlandAshley Brandon, MD;  Location: [facility];  Service: Urology;  Laterality: Left;    Family Psychiatric History: I have reviewed family psychiatric history from my progress note on 06/20/2017.  Family History:  Family History  Problem Relation Age of Onset  . Depression Father   . Alcohol abuse Father   . Depression Mother   . Hyperlipidemia Mother   . Sleep apnea Mother   . Depression Sister   . Hyperlipidemia Brother   . Depression Brother   . Hyperlipidemia Brother   . Depression Brother   . Alcohol abuse Brother   . Coronary artery disease Maternal Grandmother   . Heart attack Maternal Grandmother   . Diabetes Maternal Grandmother   . Lung cancer Maternal Grandfather   . Emphysema Maternal Grandfather   . Coronary artery disease Maternal Grandfather   . Lupus Other        Aunt  . Fibromyalgia Other        Aunt    Social History: I have reviewed social history from my progress note on 06/20/2017 Social History   Socioeconomic History  . Marital status: Married    Spouse name: brian  . Number of children: 2  . Years of education: Not on file  . Highest education level: Some college, no degree  Occupational History  . Occupation: Arts development officerHome maker  Social Needs  . Financial resource strain: Not hard at all  . Food insecurity    Worry: Never true    Inability: Never true  . Transportation needs    Medical: No    Non-medical: No  Tobacco Use  . Smoking status: Never Smoker  . Smokeless tobacco: Never Used  Substance and Sexual Activity  . Alcohol use: Yes    Alcohol/week: 2.0 - 3.0 standard drinks    Types: 2 Glasses of wine per week  . Drug use: No  . Sexual activity: Not Currently    Partners: Male    Birth control/protection: None, I.U.D.  Lifestyle  . Physical activity    Days per week: 0 days    Minutes per session: 0 min  . Stress: To some extent  Relationships  . Social connections    Talks on phone: More than three times a week    Gets together:  Once a week    Attends religious service: Never    Active member of club or organization: No    Attends meetings of clubs or organizations: Never    Relationship status: Married  Other Topics Concern  . Not on file  Social History Narrative   1 child, 2 step-sons      Regular exercise-no   Diet: no fast food, likes sweets    Allergies:  Allergies  Allergen Reactions  . Molds & Smuts   . Pollen Extract   . Uncaria Tomentosa (Cats Claw)   . Milk-Related Compounds     Able to tolerate yogurt; mild intolerance to milk/cheese  . Pineapple   . Soy  Allergy   . Strawberry Extract     Break out on tongue noted    Metabolic Disorder Labs: Lab Results  Component Value Date   HGBA1C 4.7 (L) 05/22/2017   MPG 88.19 05/22/2017   No results found for: PROLACTIN Lab Results  Component Value Date   CHOL 105 05/22/2017   TRIG 41 05/22/2017   HDL 48 05/22/2017   CHOLHDL 2.2 05/22/2017   VLDL 8 05/22/2017   LDLCALC 49 05/22/2017   LDLCALC 99 09/11/2013   Lab Results  Component Value Date   TSH 0.504 05/21/2018   TSH 0.677 05/22/2017    Therapeutic Level Labs: Lab Results  Component Value Date   LITHIUM 0.3 (L) 11/26/2017   LITHIUM 0.3 (L) 11/18/2017   No results found for: VALPROATE No components found for:  CBMZ  Current Medications: Current Outpatient Medications  Medication Sig Dispense Refill  . ARIPiprazole (ABILIFY) 30 MG tablet Take 1 tablet (30 mg total) by mouth daily. 90 tablet 0  . Biotin 5000 MCG TABS Take 1 tablet by mouth daily.    Marland Kitchen buPROPion (WELLBUTRIN) 75 MG tablet Take 1 tablet (75 mg total) by mouth every morning. 90 tablet 0  . calcium citrate-vitamin D 500-400 MG-UNIT chewable tablet Chew 1 tablet by mouth daily.     Marland Kitchen docusate sodium (COLACE) 100 MG capsule Take 300 mg by mouth daily.    Derrill Memo ON 09/16/2018] eszopiclone (LUNESTA) 2 MG TABS tablet Take 1 tablet (2 mg total) by mouth at bedtime. Take immediately before bedtime 30 tablet 1  .  ferrous sulfate 325 (65 FE) MG tablet Take 325 mg by mouth daily with breakfast.    . hydrOXYzine (ATARAX/VISTARIL) 25 MG tablet Take 1 tablet (25 mg total) by mouth daily as needed for anxiety. 90 tablet 1  . lamoTRIgine (LAMICTAL) 100 MG tablet Take 1 tablet (100 mg total) by mouth daily. 90 tablet 0  . lamoTRIgine (LAMICTAL) 25 MG tablet Take 2 tablets (50 mg total) by mouth daily. To be added to 100 mg daily 180 tablet 0  . [START ON 09/16/2018] lisdexamfetamine (VYVANSE) 20 MG capsule Take 1 capsule (20 mg total) by mouth daily at 6 (six) AM. 30 capsule 0  . lithium carbonate (LITHOBID) 300 MG CR tablet TAKE 1 TABLET BY MOUTH EVERY DAY WITH SUPPER 90 tablet 1  . montelukast (SINGULAIR) 10 MG tablet Take 10 mg by mouth daily.    . Multiple Vitamin (MULTIVITAMIN) tablet Take 1 tablet by mouth daily.    Marland Kitchen NIFEdipine (PROCARDIA-XL/ADALAT CC) 60 MG 24 hr tablet Take 60 mg by mouth daily.     Marland Kitchen omeprazole (PRILOSEC) 20 MG capsule Take 20 mg by mouth daily.    Marland Kitchen PARAGARD INTRAUTERINE COPPER IUD IUD by Intrauterine route.     No current facility-administered medications for this visit.      Musculoskeletal: Strength & Muscle Tone: UTA Gait & Station: normal Patient leans: N/A  Psychiatric Specialty Exam: Review of Systems  Psychiatric/Behavioral: Negative for substance abuse. The patient is not nervous/anxious and does not have insomnia.   All other systems reviewed and are negative.   There were no vitals taken for this visit.There is no height or weight on file to calculate BMI.  General Appearance: Casual  Eye Contact:  Fair  Speech:  Clear and Coherent  Volume:  Normal  Mood:  Euthymic  Affect:  Appropriate  Thought Process:  Goal Directed and Descriptions of Associations: Intact  Orientation:  Full (Time, Place,  and Person)  Thought Content: Logical   Suicidal Thoughts:  No  Homicidal Thoughts:  No  Memory:  Immediate;   Fair Recent;   Fair Remote;   Fair  Judgement:  Fair   Insight:  Fair  Psychomotor Activity:  Normal  Concentration:  Concentration: Fair and Attention Span: Fair  Recall:  FiservFair  Fund of Knowledge: Fair  Language: Fair  Akathisia:  No  Handed:  Right  AIMS (if indicated): Denies tremors  Assets:  Communication Skills Desire for Improvement Social Support  ADL's:  Intact  Cognition: WNL  Sleep:  Fair   Screenings: AIMS     Office Visit from 12/11/2017 in [facility] Office Visit from 11/29/2017 in [facility] Office Visit from 11/20/2017 in [facility] Admission (Discharged) from [facility]21[facility]2017 [facility]  AIMS Total Score  1  7  5   0    AUDIT     Ad[facility]Discharged[facility]  Alcohol Use Disorder Identification Test Final Score (AUDIT)  6       Assessment and Plan: Sierra [name] is a 38 year old Caucasian female who has a history of bipolar disorder, anxiety disorder, binge eating disorder was evaluated by telemedicine today.  Patient is currently doing well on the current combination of medications.  Continue plan as noted below.  Plan For bipolar disorder- stable Lamotrigine 150 mg p.o. daily Lithium 300 mg p.o. nightly-reduced dosage. Lithium level on 11/19/2017-0.3-subtherapeutic. Abilify 30 mg p.o. daily. Wellbutrin at reduced dosage of 75 mg p.o. daily now that she is on Vyvanse.  For anxiety disorder- improving Hydroxyzine as needed  For insomnia-improving Lunesta 2 mg p.o. nightly  For ADHD inattentive type-improving Vyvanse 20 mg p.o. daily in the morning I have reviewed [place] controlled substance database  For binge eating disorder-in remission We will continue to monitor closely.  Patient will benefit from the following labs-lithium level as well as hemoglobin A1c, prolactin, TSH, lipid panel.  Will discuss with patient.  Follow-up in clinic in 1 month or sooner  if needed.  October 5 at 8:30 AM  I have spent atleast 15 minutes non face to face with patient today. More than 50 % of the time was spent for psychoeducation and supportive psychotherapy and care coordination.  This note was generated in part or whole with voice recognition software. Voice recognition is usually quite accurate but there are transcription errors that can and very often do occur. I apologize for any typographical errors that were not detected and corrected.          Jomarie LongsSaramma Ayomikun Starling, MD 08/25/2018, 3:44 PM

## 2018-08-26 ENCOUNTER — Encounter: Payer: Self-pay | Admitting: Obstetrics & Gynecology

## 2018-08-26 ENCOUNTER — Ambulatory Visit (INDEPENDENT_AMBULATORY_CARE_PROVIDER_SITE_OTHER): Admitting: Obstetrics & Gynecology

## 2018-08-26 VITALS — BP 134/80

## 2018-08-26 DIAGNOSIS — N871 Moderate cervical dysplasia: Secondary | ICD-10-CM | POA: Diagnosis not present

## 2018-08-26 DIAGNOSIS — N898 Other specified noninflammatory disorders of vagina: Secondary | ICD-10-CM

## 2018-08-26 DIAGNOSIS — Z30431 Encounter for routine checking of intrauterine contraceptive device: Secondary | ICD-10-CM | POA: Diagnosis not present

## 2018-08-26 NOTE — Patient Instructions (Signed)
1. 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  Decision to proceed with a repeat colposcopy after counseling on LEEP.  Colposcopy findings compatible with CIN-1 to 2, will wait on pathology results to decide on management.  Findings reviewed with patient.  No complication and well-tolerated.  2. Encounter for routine checking of intrauterine contraceptive device (IUD) If needs a LEEP, will remove the IUD just prior to the LEEP and insert the new one when healed.  3. Vaginal odor Wet prep negative, reassured. - WET PREP FOR [employer], YEAST, CLUE  Sierra [name], it was a pleasure seeing you today!  I will inform you of the results as soon as they are available.

## 2018-08-26 NOTE — Addendum Note (Signed)
Addended by: Thurnell Garbe A on: 08/26/2018 10:54 AM   Modules accepted: Orders

## 2018-08-26 NOTE — Progress Notes (Signed)
    Sierra [name] 04-25-1980 [id number]        38 y.o.  [id number] Boyfriend  RP: CIN 1-2 in 05/2018 for repeat Colposcopy  HPI:  CIN 1 and CIN 2 on Colposcopy in 05/2018.  HPV 16 positive 12/2017.  Patient was recommended to proceed with a LEEP at that time, but decided to repeat a Colposcopy.  Paragard IUD due for change later this month.    OB History  Gravida Para Term Preterm AB Living  5 2     2 2   SAB TAB Ectopic Multiple Live Births  2            # Outcome Date GA Lbr Len/2nd Weight Sex Delivery Anes PTL Lv  5 Gravida           4 SAB           3 SAB           2 Para           1 Para             Past medical history,surgical history, problem list, medications, allergies, family history and social history were all reviewed and documented in the EPIC chart.   Directed ROS with pertinent positives and negatives documented in the history of present illness/assessment and plan.  Exam:  Vitals:   08/26/18 0916  BP: 134/80   General appearance:  Normal  Colposcopy Procedure Note Sierra [name] 08/26/2018  Indications: CIN 1-2 for repeat Colposcopy.  H/O HPV 16 positive 12/2017.  Procedure Details  The risks and benefits of the procedure and Verbal informed consent obtained.  Speculum placed in vagina and excellent visualization of cervix achieved, cervix swabbed x 3 with acetic acid solution.  Findings:  Cervix colposcopy: Physical Exam Genitourinary:       Vaginal colposcopy: Normal  Vulvar colposcopy: Normal  Perirectal colposcopy: Grossly normal  The cervix was sprayed with Hurricane before performing the cervical biopsies.  Specimens:  Cervical biopsies at 3-2-9 O'Clock  Complications:  None, hemostasis with Silver Nitrate and Moncel . Plan:  Management per results  Wet prep negative   Assessment/Plan:  38 y.o. [id number]   1. 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  History of HPV 16+ December  2019.  Decision to proceed with a repeat colposcopy after counseling on LEEP.  Colposcopy findings compatible with CIN-1 to 2, will wait on pathology results to decide on management.  Findings reviewed with patient.  No complication and well-tolerated.  2. Encounter for routine checking of intrauterine contraceptive device (IUD) If needs a LEEP, will remove the IUD just prior to the LEEP and insert the new one when healed.  3. Vaginal odor Wet prep negative, reassured. - WET PREP FOR Sierra Brooks, YEAST, CLUE  Counseling on above issues and coordination of care more than 50% for 15 minutes.   Princess Bruins MD, 9:26 AM 08/26/2018

## 2018-08-27 LAB — WET PREP FOR TRICH, YEAST, CLUE

## 2018-08-28 ENCOUNTER — Other Ambulatory Visit (HOSPITAL_COMMUNITY)

## 2018-08-28 LAB — TISSUE PATH REPORT 10802

## 2018-08-28 LAB — PATHOLOGY REPORT

## 2018-08-29 ENCOUNTER — Other Ambulatory Visit: Payer: Self-pay

## 2018-08-30 ENCOUNTER — Other Ambulatory Visit (HOSPITAL_COMMUNITY)

## 2018-09-01 ENCOUNTER — Ambulatory Visit (INDEPENDENT_AMBULATORY_CARE_PROVIDER_SITE_OTHER): Admitting: Obstetrics & Gynecology

## 2018-09-01 ENCOUNTER — Ambulatory Visit: Admit: 2018-09-01 | Admitting: Obstetrics & Gynecology

## 2018-09-01 ENCOUNTER — Other Ambulatory Visit: Payer: Self-pay | Admitting: Obstetrics & Gynecology

## 2018-09-01 ENCOUNTER — Ambulatory Visit (INDEPENDENT_AMBULATORY_CARE_PROVIDER_SITE_OTHER)

## 2018-09-01 ENCOUNTER — Other Ambulatory Visit: Payer: Self-pay

## 2018-09-01 DIAGNOSIS — Z30433 Encounter for removal and reinsertion of intrauterine contraceptive device: Secondary | ICD-10-CM | POA: Diagnosis not present

## 2018-09-01 DIAGNOSIS — Z30430 Encounter for insertion of intrauterine contraceptive device: Secondary | ICD-10-CM

## 2018-09-01 DIAGNOSIS — Z6841 Body Mass Index (BMI) 40.0 and over, adult: Secondary | ICD-10-CM

## 2018-09-01 DIAGNOSIS — N920 Excessive and frequent menstruation with regular cycle: Secondary | ICD-10-CM | POA: Diagnosis not present

## 2018-09-01 SURGERY — HYSTERECTOMY, VAGINAL, LAPAROSCOPY-ASSISTED, WITH SALPINGECTOMY
Anesthesia: General | Laterality: Bilateral

## 2018-09-02 ENCOUNTER — Inpatient Hospital Stay

## 2018-09-03 ENCOUNTER — Encounter: Payer: Self-pay | Admitting: Anesthesiology

## 2018-09-04 ENCOUNTER — Other Ambulatory Visit: Payer: Self-pay

## 2018-09-04 ENCOUNTER — Inpatient Hospital Stay: Attending: Oncology

## 2018-09-04 DIAGNOSIS — N920 Excessive and frequent menstruation with regular cycle: Secondary | ICD-10-CM | POA: Insufficient documentation

## 2018-09-04 DIAGNOSIS — D500 Iron deficiency anemia secondary to blood loss (chronic): Secondary | ICD-10-CM | POA: Insufficient documentation

## 2018-09-04 DIAGNOSIS — D509 Iron deficiency anemia, unspecified: Secondary | ICD-10-CM

## 2018-09-04 LAB — CBC WITH DIFFERENTIAL/PLATELET
Abs Immature Granulocytes: 0.01 10*3/uL (ref 0.00–0.07)
Basophils Absolute: 0 10*3/uL (ref 0.0–0.1)
Basophils Relative: 1 %
Eosinophils Absolute: 0.1 10*3/uL (ref 0.0–0.5)
Eosinophils Relative: 2 %
HCT: 39.9 % (ref 36.0–46.0)
Hemoglobin: 13.2 g/dL (ref 12.0–15.0)
Immature Granulocytes: 0 %
Lymphocytes Relative: 16 %
Lymphs Abs: 0.9 10*3/uL (ref 0.7–4.0)
MCH: 29.7 pg (ref 26.0–34.0)
MCHC: 33.1 g/dL (ref 30.0–36.0)
MCV: 89.9 fL (ref 80.0–100.0)
Monocytes Absolute: 0.6 10*3/uL (ref 0.1–1.0)
Monocytes Relative: 11 %
Neutro Abs: 4.1 10*3/uL (ref 1.7–7.7)
Neutrophils Relative %: 70 %
Platelets: 256 10*3/uL (ref 150–400)
RBC: 4.44 MIL/uL (ref 3.87–5.11)
RDW: 13.4 % (ref 11.5–15.5)
WBC: 5.8 10*3/uL (ref 4.0–10.5)
nRBC: 0 % (ref 0.0–0.2)

## 2018-09-04 LAB — IRON AND TIBC
Iron: 43 ug/dL (ref 28–170)
Saturation Ratios: 12 % (ref 10.4–31.8)
TIBC: 355 ug/dL (ref 250–450)
UIBC: 312 ug/dL

## 2018-09-04 LAB — FERRITIN: Ferritin: 36 ng/mL (ref 11–307)

## 2018-09-07 ENCOUNTER — Encounter: Payer: Self-pay | Admitting: Obstetrics & Gynecology

## 2018-09-07 NOTE — Progress Notes (Signed)
Sierra [name] 1980/02/04 [id number]        38 y.o.  [id number]   RP: Mirena IUD removal and insertion of a new Mirena IUD under [place]  HPI: Time to change the Mirena IUD.  No pelvic pain.  No abnormal bleeding.  Normal vaginal secretions.   OB History  Gravida Para Term Preterm AB Living  5 2     2 2   SAB TAB Ectopic Multiple Live Births  2            # Outcome Date GA Lbr Len/2nd Weight Sex Delivery Anes PTL Lv  5 Gravida           4 SAB           3 SAB           2 Para           1 Para             Past medical history,surgical history, problem list, medications, allergies, family history and social history were all reviewed and documented in the EPIC chart.   Directed ROS with pertinent positives and negatives documented in the history of present illness/assessment and plan.  Exam:  There were no vitals filed for this visit. General appearance:  Normal                                                                    IUD procedure note       Patient presented to the office today for removal and placement of Mirena IUD. The patient had previously been provided with literature information on this method of contraception. The risks benefits and pros and cons were discussed and all her questions were answered. She is fully aware that this form of contraception is 99% effective and is good for 5 years.  Pelvic US: T/V images.  Uterus anteverted homogenous measuring 10.82 x 6.13 x 5.27 cm.  Endometrial lining normal at 7.5 mm.  IUD in normal intrauterine position.  Right ovary normal.  Left ovary with a thin-walled primarily echo-free cyst measuring 2.5 x 2.1 cm with a thin septum.  No free fluid in the posterior cutis sac.  Pelvic exam: Vulva normal Vagina: No lesions or discharge Cervix: No lesions or discharge.  Ultrasound guidance for IUD removal, strings grasped with the small IUD clamp and IUD removed easily, complete and intact.  Shown to patient and discarded.  Uterus: AV position Adnexa: No masses or tenderness Rectal exam: Not done  The cervix was cleansed with Betadine solution. Hurricane spray on the cervix.  A single-tooth tenaculum was placed on the anterior cervical lip. The IUD was shown to the patient and inserted under ultrasound guidance in a sterile fashion.  Hysterometry with the IUD as being inserted was 8 cm.  The IUD string was trimmed. The single-tooth tenaculum was removed.  The IUD was seen in normal intrauterine position after insertion.  Patient was instructed to return back to the office in one month for follow up.        Assessment/Plan:  38 y.o. [id number]   1. Encounter for IUD removal and reinsertion Mirena IUD removal and reinsertion of a new Mirena IUD under ultrasound guidance without difficulty, well-tolerated and  no complication.  Precautions reviewed with patient.  Follow-up in 4 weeks for IUD check.  Genia DelMarie-Lyne Gwendolyne Welford MD, 8:46 AM 09/07/2018

## 2018-09-07 NOTE — Patient Instructions (Signed)
1. Encounter for IUD removal and reinsertion Mirena IUD removal and reinsertion of a new Mirena IUD under ultrasound guidance without difficulty, well-tolerated and no complication.  Precautions reviewed with patient.  Follow-up in 4 weeks for IUD check.  Sierra [name], it was a pleasure seeing you today!

## 2018-10-01 ENCOUNTER — Encounter: Payer: Self-pay | Admitting: Obstetrics & Gynecology

## 2018-10-01 ENCOUNTER — Other Ambulatory Visit: Payer: Self-pay

## 2018-10-01 ENCOUNTER — Ambulatory Visit (INDEPENDENT_AMBULATORY_CARE_PROVIDER_SITE_OTHER): Admitting: Obstetrics & Gynecology

## 2018-10-01 VITALS — BP 132/84

## 2018-10-01 DIAGNOSIS — Z30431 Encounter for routine checking of intrauterine contraceptive device: Secondary | ICD-10-CM | POA: Diagnosis not present

## 2018-10-01 NOTE — Patient Instructions (Signed)
1. Encounter for routine checking of intrauterine contraceptive device (IUD) Mirena IUD well-tolerated since insertion 4 weeks ago.  Confirm good position of the [place] IUD with strings visible at the exocervix.  No evidence of infection.  Patient reassured and precautions reviewed.  Sierra [name], it was a pleasure seeing you today!

## 2018-10-01 NOTE — Progress Notes (Signed)
    Sierra [name] Oct 29, 1980 [id number]        38 y.o.  [id number] Boyfriend  RP: Mirena IUD check 4 weeks post insertion  HPI: Well with Mirena IUD since insertion 4 weeks ago.  Mild breakthrough bleeding.  No abnormal vaginal discharge.  No pelvic pain.  No fever.  Not sexually active since insertion.   OB History  Gravida Para Term Preterm AB Living  5 2     2 2   SAB TAB Ectopic Multiple Live Births  2            # Outcome Date GA Lbr Len/2nd Weight Sex Delivery Anes PTL Lv  5 Gravida           4 SAB           3 SAB           2 Para           1 Para             Past medical history,surgical history, problem list, medications, allergies, family history and social history were all reviewed and documented in the EPIC chart.   Directed ROS with pertinent positives and negatives documented in the history of present illness/assessment and plan.  Exam:  Vitals:   10/01/18 1106  BP: 132/84   General appearance:  Normal  Abdomen: Normal  Gynecologic exam: Vulva normal.  Speculum: Cervix normal with no erythema.  IUD strings visible at the exocervix.  Normal vaginal secretions with mild dark blood.   Assessment/Plan:  38 y.o. [id number]   1. Encounter for routine checking of intrauterine contraceptive device (IUD) Mirena IUD well-tolerated since insertion 4 weeks ago.  Confirm good position of the [place] IUD with strings visible at the exocervix.  No evidence of infection.  Patient reassured and precautions reviewed.  Counseling on above issues and coordination of care more than 50% for 15 minutes.  Princess Bruins MD, 11:14 AM 10/01/2018

## 2018-10-17 ENCOUNTER — Telehealth: Payer: Self-pay | Admitting: *Deleted

## 2018-10-17 MED ORDER — NORETHINDRONE 0.35 MG PO TABS: 1.0000 | ORAL_TABLET | Freq: Every day | ORAL | 0 refills | Status: DC

## 2018-10-17 NOTE — Telephone Encounter (Signed)
Patient aware, Rx sent.  

## 2018-10-17 NOTE — Telephone Encounter (Signed)
Can send Sierra [name] x 3 packs.

## 2018-10-17 NOTE — Telephone Encounter (Signed)
Patient called to follow up with spotting with IUD, was seen on 10/01/18 and paraguard with removed and Mirena IUD was inserted. Patient states you told her if spotting continues to call and you would prescribe Rx to hopefully make it stop. Please advise

## 2018-10-20 ENCOUNTER — Other Ambulatory Visit: Payer: Self-pay

## 2018-10-20 ENCOUNTER — Ambulatory Visit (INDEPENDENT_AMBULATORY_CARE_PROVIDER_SITE_OTHER): Admitting: Psychiatry

## 2018-10-20 ENCOUNTER — Encounter: Payer: Self-pay | Admitting: Psychiatry

## 2018-10-20 DIAGNOSIS — F3177 Bipolar disorder, in partial remission, most recent episode mixed: Secondary | ICD-10-CM | POA: Diagnosis not present

## 2018-10-20 DIAGNOSIS — F900 Attention-deficit hyperactivity disorder, predominantly inattentive type: Secondary | ICD-10-CM | POA: Diagnosis not present

## 2018-10-20 DIAGNOSIS — F411 Generalized anxiety disorder: Secondary | ICD-10-CM

## 2018-10-20 DIAGNOSIS — F5081 Binge eating disorder: Secondary | ICD-10-CM

## 2018-10-20 DIAGNOSIS — F5105 Insomnia due to other mental disorder: Secondary | ICD-10-CM

## 2018-10-20 DIAGNOSIS — Z79899 Other long term (current) drug therapy: Secondary | ICD-10-CM

## 2018-10-20 MED ORDER — LAMOTRIGINE 25 MG PO TABS: 50.0000 mg | ORAL_TABLET | Freq: Every day | ORAL | 0 refills | Status: DC

## 2018-10-20 MED ORDER — LISDEXAMFETAMINE DIMESYLATE 20 MG PO CAPS: 20.0000 mg | ORAL_CAPSULE | Freq: Every day | ORAL | 0 refills | Status: DC

## 2018-10-20 MED ORDER — LAMOTRIGINE 100 MG PO TABS: 100.0000 mg | ORAL_TABLET | Freq: Every day | ORAL | 0 refills | Status: DC

## 2018-10-20 MED ORDER — BUPROPION HCL 75 MG PO TABS: 75.0000 mg | ORAL_TABLET | Freq: Every morning | ORAL | 0 refills | Status: DC

## 2018-10-20 MED ORDER — ARIPIPRAZOLE 30 MG PO TABS: 30.0000 mg | ORAL_TABLET | Freq: Every day | ORAL | 0 refills | Status: DC

## 2018-10-20 NOTE — Progress Notes (Signed)
Virtual Visit via Video Note  I connected with Sierra [name] on 10/20/18 at  8:30 AM EDT by a video enabled telemedicine application and verified that I am speaking with the correct person using two identifiers.   I discussed the limitations of evaluation and management by telemedicine and the availability of in person appointments. The patient expressed understanding and agreed to proceed.     I discussed the assessment and treatment plan with the patient. The patient was provided an opportunity to ask questions and all were answered. The patient agreed with the plan and demonstrated an understanding of the instructions.   The patient was advised to call back or seek an in-person evaluation if the symptoms worsen or if the condition fails to improve as anticipated.   BH MD OP Progress Note  10/20/2018 12:53 PM Sierra [name]  MRN:  [id number]  Chief Complaint:  Chief Complaint    Follow-up     HPI: Sierra [name] is a 38 year old Caucasian female, married, lives in [place], has a history of GAD, bipolar disorder, binge eating disorder, ADHD was evaluated by telemedicine today.  Patient today reports she is currently doing well on the current medication regimen.  She denies any significant mood lability.  She reports she does not feel hypomanic, manic or depressed.  Her anxiety is more under control.  She reports sleep is good without the Lunesta.  She has not been taking it.  Patient reports she is compliant on all her medications including the lithium.  Patient is due for lithium levels.  This was discussed with patient.  Will mail lab slip to her today.  Patient reports she is compliant on her Vyvanse and that has definitely helped her attention and focus as well as binging.  Patient denies any suicidality, homicidality or perceptual disturbances.  Patient denies any other concerns today. Visit Diagnosis:    ICD-10-CM   1. Bipolar disorder, in partial remission, most  recent episode mixed (HCC)  F31.77 ARIPiprazole (ABILIFY) 30 MG tablet    buPROPion (WELLBUTRIN) 75 MG tablet    lamoTRIgine (LAMICTAL) 100 MG tablet    lamoTRIgine (LAMICTAL) 25 MG tablet  2. GAD (generalized anxiety disorder)  F41.1 buPROPion (WELLBUTRIN) 75 MG tablet   stable  3. Attention deficit hyperactivity disorder (ADHD), predominantly inattentive type  F90.0 lisdexamfetamine (VYVANSE) 20 MG capsule    lisdexamfetamine (VYVANSE) 20 MG capsule    lisdexamfetamine (VYVANSE) 20 MG capsule  4. Insomnia due to mental condition  F51.05    stable  5. Binge eating disorder  F50.81   6. High risk medication use  Z79.899 Lithium level    Basic metabolic panel    Prolactin    Past Psychiatric History: I have reviewed past psychiatric history from my progress note on 06/20/2017  Past Medical History:  Past Medical History:  Diagnosis Date  . Abdominal pain, other specified site   . ADHD   . 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 Anemia   . Anxiety state, unspecified   . Bipolar 1 disorder, depressed (HCC) 05/21/2017  . BIPOLAR AFFECTIVE DISORDER 12/23/2006   Qualifier: Diagnosis of  By: Ermalene Searing MD, Amy    . Bipolar affective disorder (HCC) 10/12/2011  . Bipolar disorder, unspecified (HCC)   . Depressive disorder, not elsewhere classified   . Edema   . Esophageal reflux   . Fibromyalgia   . Headache   . History of kidney stones   . Mild or unspecified pre-eclampsia, unspecified as to episode of  care   . Morbid obesity (HCC)   . Myalgia and myositis, unspecified   . Other dyspnea and respiratory abnormality   . Preeclampsia    with first pregnancy  . Raynaud's disease   . Urinary tract infection, site not specified     Past Surgical History:  Procedure Laterality Date  . CESAREAN SECTION     X 2  . CHOLECYSTECTOMY  03/2006  . CYSTOSCOPY W/ URETERAL STENT PLACEMENT Left 05/14/2016   Procedure: CYSTOSCOPY WITH STENT REPLACEMENT;  Surgeon: [name], MD;  Location:  [facility];  Service: Urology;  Laterality: Left;  . CYSTOSCOPY WITH STENT PLACEMENT Left 04/25/2016   Procedure: CYSTOSCOPY WITH STENT PLACEMENT;  Surgeon: [name], MD;  Location: [facility];  Service: Urology;  Laterality: Left;  . DILATION AND CURETTAGE OF UTERUS    . IVC FILTER INSERTION  03/26/2016   [facility]  . LAPAROSCOPIC GASTRIC RESTRICTIVE DUODENAL PROCEDURE (DUODENAL SWITCH)  03/2016  . LITHOTRIPSY    . TONSILLECTOMY AND ADENOIDECTOMY    . URETEROSCOPY WITH HOLMIUM LASER LITHOTRIPSY Left 05/14/2016   Procedure: URETEROSCOPY WITH HOLMIUM LASER LITHOTRIPSY;  Surgeon: [name], MD;  Location: [facility];  Service: Urology;  Laterality: Left;    Family Psychiatric History: I have reviewed family psychiatric history from my progress note on 06/20/2017 Family History:  Family History  Problem Relation Age of Onset  . Depression Father   . Alcohol abuse Father   . Depression Mother   . Hyperlipidemia Mother   . Sleep apnea Mother   . Depression Sister   . Hyperlipidemia Brother   . Depression Brother   . Hyperlipidemia Brother   . Depression Brother   . Alcohol abuse Brother   . Coronary artery disease Maternal Grandmother   . Heart attack Maternal Grandmother   . Diabetes Maternal Grandmother   . Lung cancer Maternal Grandfather   . Emphysema Maternal Grandfather   . Coronary artery disease Maternal Grandfather   . Lupus Other        Aunt  . Fibromyalgia Other        Aunt    Social History: I have reviewed social history from my progress note on 06/20/2017 Social History   Socioeconomic History  . Marital status: Married    Spouse name: brian  . Number of children: 2  . Years of education: Not on file  . Highest education level: Some college, no degree  Occupational History  . Occupation: Arts development officer  Social Needs  . Financial resource strain: Not hard at all  . Food insecurity    Worry: Never true    Inability: Never true  . Transportation needs     Medical: No    Non-medical: No  Tobacco Use  . Smoking status: Never Smoker  . Smokeless tobacco: Never Used  Substance and Sexual Activity  . Alcohol use: Yes    Alcohol/week: 2.0 - 3.0 standard drinks    Types: 2 Glasses of wine per week  . Drug use: No  . Sexual activity: Not Currently    Partners: Male    Birth control/protection: None, I.U.D.  Lifestyle  . Physical activity    Days per week: 0 days    Minutes per session: 0 min  . Stress: To some extent  Relationships  . Social connections    Talks on phone: More than three times a week    Gets together: Once a week    Attends religious service: Never    Active  member of club or organization: No    Attends meetings of clubs or organizations: Never    Relationship status: Married  Other Topics Concern  . Not on file  Social History Narrative   1 child, 2 step-sons      Regular exercise-no   Diet: no fast food, likes sweets    Allergies:  Allergies  Allergen Reactions  . Molds & Smuts   . Pollen Extract   . Uncaria Tomentosa (Cats Claw)   . Milk-Related Compounds     Able to tolerate yogurt; mild intolerance to milk/cheese  . Pineapple   . Soy Allergy   . Strawberry Extract     Break out on tongue noted    Metabolic Disorder Labs: Lab Results  Component Value Date   HGBA1C 4.7 (L) 05/22/2017   MPG 88.19 05/22/2017   No results found for: PROLACTIN Lab Results  Component Value Date   CHOL 105 05/22/2017   TRIG 41 05/22/2017   HDL 48 05/22/2017   CHOLHDL 2.2 05/22/2017   VLDL 8 05/22/2017   LDLCALC 49 05/22/2017   LDLCALC 99 09/11/2013   Lab Results  Component Value Date   TSH 0.504 05/21/2018   TSH 0.677 05/22/2017    Therapeutic Level Labs: Lab Results  Component Value Date   LITHIUM 0.3 (L) 11/26/2017   LITHIUM 0.3 (L) 11/18/2017   No results found for: VALPROATE No components found for:  CBMZ  Current Medications: Current Outpatient Medications  Medication Sig Dispense Refill  .  ARIPiprazole (ABILIFY) 30 MG tablet Take 1 tablet (30 mg total) by mouth daily. 90 tablet 0  . Biotin 5000 MCG TABS Take 1 tablet by mouth daily.    Marland Kitchen. buPROPion (WELLBUTRIN) 75 MG tablet Take 1 tablet (75 mg total) by mouth every morning. 90 tablet 0  . calcium citrate-vitamin D 500-400 MG-UNIT chewable tablet Chew 1 tablet by mouth daily.     Marland Kitchen. docusate sodium (COLACE) 100 MG capsule Take 300 mg by mouth daily.    . eszopiclone (LUNESTA) 2 MG TABS tablet Take 1 tablet (2 mg total) by mouth at bedtime. Take immediately before bedtime 30 tablet 1  . ferrous sulfate 325 (65 FE) MG tablet Take 325 mg by mouth daily with breakfast.    . hydrOXYzine (ATARAX/VISTARIL) 25 MG tablet Take 1 tablet (25 mg total) by mouth daily as needed for anxiety. 90 tablet 1  . lamoTRIgine (LAMICTAL) 100 MG tablet Take 1 tablet (100 mg total) by mouth daily. 90 tablet 0  . lamoTRIgine (LAMICTAL) 25 MG tablet Take 2 tablets (50 mg total) by mouth daily. To be added to 100 mg daily 180 tablet 0  . lisdexamfetamine (VYVANSE) 20 MG capsule Take 1 capsule (20 mg total) by mouth daily at 6 (six) AM. 30 capsule 0  . [START ON 11/18/2018] lisdexamfetamine (VYVANSE) 20 MG capsule Take 1 capsule (20 mg total) by mouth daily. 30 capsule 0  . [START ON 12/17/2018] lisdexamfetamine (VYVANSE) 20 MG capsule Take 1 capsule (20 mg total) by mouth daily. 30 capsule 0  . lithium carbonate (LITHOBID) 300 MG CR tablet TAKE 1 TABLET BY MOUTH EVERY DAY WITH SUPPER 90 tablet 1  . montelukast (SINGULAIR) 10 MG tablet Take 10 mg by mouth daily.    . Multiple Vitamin (MULTIVITAMIN) tablet Take 1 tablet by mouth daily.    Marland Kitchen. NIFEdipine (PROCARDIA-XL/ADALAT CC) 60 MG 24 hr tablet Take 60 mg by mouth daily.     . norethindrone (MICRONOR) 0.35 MG  tablet Take 1 tablet (0.35 mg total) by mouth daily. 3 Package 0  . omeprazole (PRILOSEC) 20 MG capsule Take 20 mg by mouth daily.    Marland Kitchen PARAGARD INTRAUTERINE COPPER IUD IUD by Intrauterine route.     No  current facility-administered medications for this visit.      Musculoskeletal: Strength & Muscle Tone: UTA Gait & Station: normal Patient leans: N/A  Psychiatric Specialty Exam: Review of Systems  Psychiatric/Behavioral: Negative for hallucinations, substance abuse and suicidal ideas. The patient is not nervous/anxious.   All other systems reviewed and are negative.   There were no vitals taken for this visit.There is no height or weight on file to calculate BMI.  General Appearance: Casual  Eye Contact:  Fair  Speech:  Clear and Coherent  Volume:  Normal  Mood:  Euthymic  Affect:  Congruent  Thought Process:  Goal Directed and Descriptions of Associations: Intact  Orientation:  Full (Time, Place, and Person)  Thought Content: Logical   Suicidal Thoughts:  No  Homicidal Thoughts:  No  Memory:  Immediate;   Fair Recent;   Fair Remote;   Fair  Judgement:  Fair  Insight:  Fair  Psychomotor Activity:  Normal  Concentration:  Concentration: Fair and Attention Span: Fair  Recall:  [employer] of Knowledge: Fair  Language: Fair  Akathisia:  No  Handed:  Right  AIMS (if indicated): Denies tremors, rigidity  Assets:  Communication Skills Desire for Improvement Housing Intimacy Social Support  ADL's:  Intact  Cognition: WNL  Sleep:  Fair   Screenings: AIMS     Office Visit from 12/11/2017 in [facility] Office Visit from 11/29/2017 in [facility] Office Visit from 11/20/2017 in [facility] Admission (Discharged) from 05/21/2017 in [facility]  AIMS Total Score  0    AUDIT     Admission (Discharged) from 05/21/2017 in [facility]  Alcohol Use Disorder Identification Test Final Score (AUDIT)  6       Assessment and Plan: Sierra [name] is a 38 year old Caucasian female who has a history of bipolar disorder, anxiety disorder, binge eating  disorder was evaluated by telemedicine today.  Patient is currently stable on current medication regimen.  Plan as noted below.  Plan For bipolar disorder in remission Lamotrigine 150 mg p.o. daily Lithium 300 mg p.o. nightly-reduced dosage Lithium level on 11/19/2017-0.3-subtherapeutic Abilify 30 mg p.o. daily Wellbutrin at reduced dosage of 75 mg p.o. daily now that she is on Vyvanse.  For anxiety disorder-stable Hydroxyzine as needed  Insomnia-stable She is no longer taking Lunesta.  Will monitor closely  For ADHD inattentive type-stable Vyvanse 20 mg p.o. daily in the morning.  I have provided 3 prescriptions with date specified last 1 to be filled on or after December 17, 2018. I have reviewed [place] controlled substance database.  Binge eating disorder in remission We will continue to monitor closely.  We will order the following labs today-lithium level, prolactin, BMP.  She recently had lipid panel TSH as well as hemoglobin A1c done.  Follow-up in clinic in 3 months or sooner if needed.  December 30 at 8:30 AM  I have spent atleast 25 minutes non face to face with patient today. More than 50 % of the time was spent for psychoeducation and supportive psychotherapy and care coordination. This note was generated in part or whole with voice recognition software. Voice recognition is usually quite  accurate but there are transcription errors that can and very often do occur. I apologize for any typographical errors that were not detected and corrected.       Ursula Alert, MD 10/20/2018, 12:53 PM

## 2018-10-24 ENCOUNTER — Telehealth: Payer: Self-pay | Admitting: Psychiatry

## 2018-10-24 NOTE — Telephone Encounter (Signed)
Called patient to discuss her abnormal labs.  Reviewed and discussed the following labs dated 10/10/2018  CMP within normal limits including AST ALT Lipid panel within normal limits  Iron panel within normal limits  Thyroid profile-TSH, T3 total, T4 within normal limits . Free T4 elevated at 50.1 Discussed with patient to follow-up with her primary care provider on the same.  Vitamin D-low at 27.6 that she may need replacement.  She will follow-up with PMD  Vitamin B12 is elevated at 1500.  Patient has follow-up appointment scheduled with primary care provider and she will discuss with primary care.

## 2018-11-03 ENCOUNTER — Encounter: Payer: Self-pay | Admitting: Oncology

## 2018-11-03 ENCOUNTER — Inpatient Hospital Stay: Attending: Oncology

## 2018-11-03 ENCOUNTER — Other Ambulatory Visit: Payer: Self-pay

## 2018-11-03 ENCOUNTER — Inpatient Hospital Stay (HOSPITAL_BASED_OUTPATIENT_CLINIC_OR_DEPARTMENT_OTHER): Admitting: Oncology

## 2018-11-03 VITALS — BP 128/47 | HR 61 | Temp 98.5°F | Ht 64.0 in | Wt 321.0 lb

## 2018-11-03 DIAGNOSIS — Z793 Long term (current) use of hormonal contraceptives: Secondary | ICD-10-CM | POA: Diagnosis not present

## 2018-11-03 DIAGNOSIS — D509 Iron deficiency anemia, unspecified: Secondary | ICD-10-CM | POA: Diagnosis not present

## 2018-11-03 DIAGNOSIS — F909 Attention-deficit hyperactivity disorder, unspecified type: Secondary | ICD-10-CM | POA: Diagnosis not present

## 2018-11-03 DIAGNOSIS — F319 Bipolar disorder, unspecified: Secondary | ICD-10-CM | POA: Insufficient documentation

## 2018-11-03 DIAGNOSIS — Z791 Long term (current) use of non-steroidal anti-inflammatories (NSAID): Secondary | ICD-10-CM | POA: Insufficient documentation

## 2018-11-03 DIAGNOSIS — Z79899 Other long term (current) drug therapy: Secondary | ICD-10-CM | POA: Insufficient documentation

## 2018-11-03 LAB — CBC WITH DIFFERENTIAL/PLATELET
Abs Immature Granulocytes: 0.01 10*3/uL (ref 0.00–0.07)
Basophils Absolute: 0.1 10*3/uL (ref 0.0–0.1)
Basophils Relative: 1 %
Eosinophils Absolute: 0.1 10*3/uL (ref 0.0–0.5)
Eosinophils Relative: 2 %
HCT: 41 % (ref 36.0–46.0)
Hemoglobin: 13.5 g/dL (ref 12.0–15.0)
Immature Granulocytes: 0 %
Lymphocytes Relative: 23 %
Lymphs Abs: 1.6 10*3/uL (ref 0.7–4.0)
MCH: 29.7 pg (ref 26.0–34.0)
MCHC: 32.9 g/dL (ref 30.0–36.0)
MCV: 90.3 fL (ref 80.0–100.0)
Monocytes Absolute: 0.4 10*3/uL (ref 0.1–1.0)
Monocytes Relative: 6 %
Neutro Abs: 4.7 10*3/uL (ref 1.7–7.7)
Neutrophils Relative %: 68 %
Platelets: 304 10*3/uL (ref 150–400)
RBC: 4.54 MIL/uL (ref 3.87–5.11)
RDW: 12.7 % (ref 11.5–15.5)
WBC: 6.9 10*3/uL (ref 4.0–10.5)
nRBC: 0 % (ref 0.0–0.2)

## 2018-11-03 LAB — FERRITIN: Ferritin: 17 ng/mL (ref 11–307)

## 2018-11-03 LAB — IRON AND TIBC
Iron: 45 ug/dL (ref 28–170)
Saturation Ratios: 12 % (ref 10.4–31.8)
TIBC: 366 ug/dL (ref 250–450)
UIBC: 321 ug/dL

## 2018-11-03 NOTE — Progress Notes (Signed)
Hematology/Oncology Consult note [facility]  Telephone:([phone]) [phone] [phone]  Patient Care Team: Armando GangLindley, Cheryl P, FNP as PCP - General (Family Medicine) Alva Garnetyner, Tyrone AppleMichael A, MD [facility](Bariatrics)   Name of the patient: Sierra EnsignChristina [name]  [id number]  [birth year]   Date of visit: 11/03/18  Diagnosis-iron deficiency anemia  Chief complaint/ Reason for visit-routine follow-up of iron deficiency anemia  Heme/Onc history: Patient is a 38 year old premenopausal female with a history of gastric bypass duodenal switch surgery back in 2018. She has been referred to us for iron deficiency anemia. Patient also reports relatively heavy menstrual cycles. Although her cycles last for about 5 days the first 2 to 3 days are particularly heavy. She has seen GYN for this and has an IUD in place but menstrual cycles continue to be heavy. She currently reports feeling fatigued. Denies any consistent use of NSAIDs. Denies any history of colon cancer. She was given a prescription for iron tablets which could not be fulfilled by her insurance. She has been taking bariatric multivitamins at this time.  Results of blood work from 05/21/2018 were as follows: CBC showed white count of 6.3, H&H of 12.2/37.5 and a platelet count of 294.  Ferritin levels were low at 8.  Iron study showed an elevated TIBC of 459 and a low iron saturation of 8%.  B12 and folate were normal.  Celiac disease panel was unremarkable.  TSH was normal.  We did have a repeat to rounds of urinalysis given the patient was having some menstrual cycles in the morning analysis from 07/01/2018 did not reveal any hematuria.  H. pylori stool antigen was negative.  Interval history-patient changed her IUD to Mirena and reports that her menstrual cycles have been lighter since then.  She continues to take oral iron along with laxatives which is tolerating well.  ECOG PS- 0 Pain scale- 0   Review of systems- Review of  Systems  Constitutional: Positive for malaise/fatigue. Negative for chills, fever and weight loss.  HENT: Negative for congestion, ear discharge and nosebleeds.   Eyes: Negative for blurred vision.  Respiratory: Negative for cough, hemoptysis, sputum production, shortness of breath and wheezing.   Cardiovascular: Negative for chest pain, palpitations, orthopnea and claudication.  Gastrointestinal: Negative for abdominal pain, blood in stool, constipation, diarrhea, heartburn, melena, nausea and vomiting.  Genitourinary: Negative for dysuria, flank pain, frequency, hematuria and urgency.  Musculoskeletal: Negative for back pain, joint pain and myalgias.  Skin: Negative for rash.  Neurological: Negative for dizziness, tingling, focal weakness, seizures, weakness and headaches.  Endo/Heme/Allergies: Does not bruise/bleed easily.  Psychiatric/Behavioral: Negative for depression and suicidal ideas. The patient does not have insomnia.       Allergies  Allergen Reactions  . Molds & Smuts   . Pollen Extract   . Uncaria Tomentosa (Cats Claw)   . Milk-Related Compounds     Able to tolerate yogurt; mild intolerance to milk/cheese  . Pineapple   . Soy Allergy   . Strawberry Extract     Break out on tongue noted     Past Medical History:  Diagnosis Date  . Abdominal pain, other specified site   . ADHD   . 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 Anemia   . Anxiety state, unspecified   . Bipolar 1 disorder, depressed (HCC) 05/21/2017  . BIPOLAR AFFECTIVE DISORDER 12/23/2006   Qualifier: Diagnosis of  By: Ermalene SearingBedsole MD, Amy    . Bipolar affective disorder (HCC) 10/12/2011  . Bipolar disorder,  unspecified (HCC)   . Depressive disorder, not elsewhere classified   . Edema   . Esophageal reflux   . Fibromyalgia   . Headache   . History of kidney stones   . Mild or unspecified pre-eclampsia, unspecified as to episode of care   . Morbid obesity (HCC)   . Myalgia and myositis, unspecified    . Other dyspnea and respiratory abnormality   . Preeclampsia    with first pregnancy  . Raynaud's disease   . Urinary tract infection, site not specified      Past Surgical History:  Procedure Laterality Date  . CESAREAN SECTION     X 2  . CHOLECYSTECTOMY  03/2006  . CYSTOSCOPY W/ URETERAL STENT PLACEMENT Left 05/14/2016   Procedure: CYSTOSCOPY WITH STENT REPLACEMENT;  Surgeon: [name], MD;  Location: [facility] ORS;  Service: Urology;  Laterality: Left;  . CYSTOSCOPY WITH STENT PLACEMENT Left 04/25/2016   Procedure: CYSTOSCOPY WITH STENT PLACEMENT;  Surgeon: [name], MD;  Location: [facility];  Service: Urology;  Laterality: Left;  . DILATION AND CURETTAGE OF UTERUS    . IVC FILTER INSERTION  03/26/2016   [facility]  . LAPAROSCOPIC GASTRIC RESTRICTIVE DUODENAL PROCEDURE (DUODENAL SWITCH)  03/2016  . LITHOTRIPSY    . TONSILLECTOMY AND ADENOIDECTOMY    . URETEROSCOPY WITH HOLMIUM LASER LITHOTRIPSY Left 05/14/2016   Procedure: URETEROSCOPY WITH HOLMIUM LASER LITHOTRIPSY;  Surgeon: [name], MD;  Location: [facility] OR[facility];  Service: Urology;  Laterality: Left;    Social History   Socioeconomic History  . Marital status: Married    Spouse name: brian  . Number of children: 2  . Years of education: Not on file  . Highest education level: Some college, no degree  Occupational History  . Occupation: Arts development officer  Social Needs  . Financial resource strain: Not hard at all  . Food insecurity    Worry: Never true    Inability: Never true  . Transportation needs    Medical: No    Non-medical: No  Tobacco Use  . Smoking status: Never Smoker  . Smokeless tobacco: Never Used  Substance and Sexual Activity  . Alcohol use: Yes    Alcohol/week: 2.0 - 3.0 standard drinks    Types: 2 Glasses of wine per week  . Drug use: No  . Sexual activity: Not Currently    Partners: Male    Birth control/protection: None, I.U.D.  Lifestyle  . Physical activity    Days per week: 0 days     Minutes per session: 0 min  . Stress: To some extent  Relationships  . Social connections    Talks on phone: More than three times a week    Gets together: Once a week    Attends religious service: Never    Active member of club or organization: No    Attends meetings of clubs or organizations: Never    Relationship status: Married  . Intimate partner violence    Fear of current or ex partner: No    Emotionally abused: No    Physically abused: No    Forced sexual activity: No  Other Topics Concern  . Not on file  Social History Narrative   1 child, 2 step-sons      Regular exercise-no   Diet: no fast food, likes sweets    Family History  Problem Relation Age of Onset  . Depression Father   . Alcohol abuse Father   . Depression Mother   .  Hyperlipidemia Mother   . Sleep apnea Mother   . Depression Sister   . Hyperlipidemia Brother   . Depression Brother   . Hyperlipidemia Brother   . Depression Brother   . Alcohol abuse Brother   . Coronary artery disease Maternal Grandmother   . Heart attack Maternal Grandmother   . Diabetes Maternal Grandmother   . Lung cancer Maternal Grandfather   . Emphysema Maternal Grandfather   . Coronary artery disease Maternal Grandfather   . Lupus Other        Aunt  . Fibromyalgia Other        Aunt     Current Outpatient Medications:  .  ARIPiprazole (ABILIFY) 30 MG tablet, Take 1 tablet (30 mg total) by mouth daily., Disp: 90 tablet, Rfl: 0 .  Biotin 5000 MCG TABS, Take 1 tablet by mouth daily., Disp: , Rfl:  .  buPROPion (WELLBUTRIN) 75 MG tablet, Take 1 tablet (75 mg total) by mouth every morning., Disp: 90 tablet, Rfl: 0 .  calcium citrate-vitamin D 500-400 MG-UNIT chewable tablet, Chew 1 tablet by mouth daily. , Disp: , Rfl:  .  docusate sodium (COLACE) 100 MG capsule, Take 300 mg by mouth daily., Disp: , Rfl:  .  ferrous sulfate 325 (65 FE) MG tablet, Take 325 mg by mouth daily with breakfast., Disp: , Rfl:  .  hydrOXYzine  (ATARAX/VISTARIL) 25 MG tablet, Take 1 tablet (25 mg total) by mouth daily as needed for anxiety., Disp: 90 tablet, Rfl: 1 .  lamoTRIgine (LAMICTAL) 100 MG tablet, Take 1 tablet (100 mg total) by mouth daily., Disp: 90 tablet, Rfl: 0 .  lamoTRIgine (LAMICTAL) 25 MG tablet, Take 2 tablets (50 mg total) by mouth daily. To be added to 100 mg daily, Disp: 180 tablet, Rfl: 0 .  levonorgestrel (MIRENA) 20 MCG/24HR IUD, 1 each by Intrauterine route once., Disp: , Rfl:  .  lisdexamfetamine (VYVANSE) 20 MG capsule, Take 1 capsule (20 mg total) by mouth daily at 6 (six) AM., Disp: 30 capsule, Rfl: 0 .  lithium carbonate (LITHOBID) 300 MG CR tablet, TAKE 1 TABLET BY MOUTH EVERY DAY WITH SUPPER, Disp: 90 tablet, Rfl: 1 .  meloxicam (MOBIC) 15 MG tablet, Take 1 tablet by mouth 1 day or 1 dose., Disp: , Rfl:  .  montelukast (SINGULAIR) 10 MG tablet, Take 10 mg by mouth daily., Disp: , Rfl:  .  Multiple Vitamin (MULTIVITAMIN) tablet, Take 1 tablet by mouth daily., Disp: , Rfl:  .  NIFEdipine (PROCARDIA-XL/ADALAT CC) 60 MG 24 hr tablet, Take 60 mg by mouth daily. , Disp: , Rfl:  .  norethindrone (MICRONOR) 0.35 MG tablet, Take 1 tablet (0.35 mg total) by mouth daily., Disp: 3 Package, Rfl: 0 .  omeprazole (PRILOSEC) 20 MG capsule, Take 20 mg by mouth daily., Disp: , Rfl:  .  eszopiclone (LUNESTA) 2 MG TABS tablet, Take 1 tablet (2 mg total) by mouth at bedtime. Take immediately before bedtime (Patient not taking: Reported on 11/03/2018), Disp: 30 tablet, Rfl: 1 .  PARAGARD INTRAUTERINE COPPER IUD IUD, by Intrauterine route., Disp: , Rfl:   Physical exam:  Vitals:   11/03/18 1033  BP: (!) 128/47  Pulse: 61  Temp: 98.5 F (36.9 C)  TempSrc: Tympanic  Weight: (!) 321 lb (145.6 kg)  Height:  (1.626 m)   Physical Exam Constitutional:      General: She is not in acute distress.    Appearance: She is obese.  HENT:  Head: Normocephalic and atraumatic.  Eyes:     Pupils: Pupils are equal, round, and  reactive to light.  Neck:     Musculoskeletal: Normal range of motion.  Cardiovascular:     Rate and Rhythm: Normal rate and regular rhythm.     Heart sounds: Normal heart sounds.  Pulmonary:     Effort: Pulmonary effort is normal.     Breath sounds: Normal breath sounds.  Abdominal:     General: Bowel sounds are normal.     Palpations: Abdomen is soft.  Skin:    General: Skin is warm and dry.  Neurological:     Mental Status: She is alert and oriented to person, place, and time.      CMP Latest Ref Rng & Units 09/07/2017  Glucose 70 - 99 mg/dL 95  BUN 6 - 20 mg/dL 12  Creatinine 0.44 - 1.00 mg/dL 0.73  Sodium 135 - 145 mmol/L 140  Potassium 3.5 - 5.1 mmol/L 4.4  Chloride 98 - 111 mmol/L 110  CO2 22 - 32 mmol/L 26  Calcium 8.9 - 10.3 mg/dL 9.2  Total Protein 6.5 - 8.1 g/dL -  Total Bilirubin 0.3 - 1.2 mg/dL -  Alkaline Phos 38 - 126 U/L -  AST 15 - 41 U/L -  ALT 14 - 54 U/L -   CBC Latest Ref Rng & Units 11/03/2018  WBC 4.0 - 10.5 K/uL 6.9  Hemoglobin 12.0 - 15.0 g/dL 13.5  Hematocrit 36.0 - 46.0 % 41.0  Platelets 150 - 400 K/uL 304      Assessment and plan- Patient is a 38 y.o. female with iron deficiency anemia likely secondary to gastric bypass and menorrhagia here for routine follow-up  Patient has had evidence of iron deficiency in the past but has never been anemic.  Her hemoglobin is 13.5 today.  Ferritin levels did come back at 17 which is low.  We had already discussed the patient would like IV iron versus continue oral iron given that she is not anemic and she would have to come here for IV iron infusions during Covid.  Patient prefers not to come for infusions at this time and would prefer to wait.  I will repeat CBC ferritin iron studies in 3 months in 6 months and see her back in 6 months   Visit Diagnosis 1. Iron deficiency anemia, unspecified iron deficiency anemia type      Dr. Randa Evens, MD, MPH [facility]  [phone] 11/03/2018 3:39 PM

## 2018-11-03 NOTE — Progress Notes (Signed)
Patient stated that she had been doing well with no complaints. 

## 2018-11-06 ENCOUNTER — Telehealth: Payer: Self-pay

## 2018-11-06 NOTE — Telephone Encounter (Signed)
pt called left message that she doesnt believe her vyvanse is working.  she stated that she is taking meloxicam and norethindrone and wanted to know if that could mess with the vyvanse

## 2018-11-06 NOTE — Telephone Encounter (Signed)
Return call to patient.  Left message.

## 2018-11-08 LAB — BASIC METABOLIC PANEL
BUN/Creatinine Ratio: 11 (ref 9–23)
BUN: 10 mg/dL (ref 6–20)
CO2: 21 mmol/L (ref 20–29)
Calcium: 9.6 mg/dL (ref 8.7–10.2)
Chloride: 107 mmol/L — ABNORMAL HIGH (ref 96–106)
Creatinine, Ser: 0.93 mg/dL (ref 0.57–1.00)
GFR calc Af Amer: 91 mL/min/{1.73_m2} (ref >59)
GFR calc non Af Amer: 79 mL/min/{1.73_m2} (ref >59)
Glucose: 92 mg/dL (ref 65–99)
Potassium: 4.1 mmol/L (ref 3.5–5.2)
Sodium: 139 mmol/L (ref 134–144)

## 2018-11-08 LAB — LITHIUM LEVEL: Lithium Lvl: 0.1 mmol/L — ABNORMAL LOW (ref 0.6–1.2)

## 2018-11-08 LAB — PROLACTIN: Prolactin: 7.6 ng/mL (ref 4.8–23.3)

## 2018-11-12 ENCOUNTER — Telehealth: Payer: Self-pay

## 2018-11-12 DIAGNOSIS — F411 Generalized anxiety disorder: Secondary | ICD-10-CM

## 2018-11-12 DIAGNOSIS — F3177 Bipolar disorder, in partial remission, most recent episode mixed: Secondary | ICD-10-CM

## 2018-11-12 DIAGNOSIS — F900 Attention-deficit hyperactivity disorder, predominantly inattentive type: Secondary | ICD-10-CM

## 2018-11-12 MED ORDER — LISDEXAMFETAMINE DIMESYLATE 30 MG PO CAPS: 30.0000 mg | ORAL_CAPSULE | Freq: Every day | ORAL | 0 refills | Status: DC

## 2018-11-12 NOTE — Telephone Encounter (Signed)
pt left message that she thinks she is having a interaction between her vyvanse and birth control pills

## 2018-11-12 NOTE — Telephone Encounter (Signed)
Returned call to patient.  She reports that Vyvanse was not helpful.  She wonders whether it is an interaction between her other medications.  Discussed with her that Abilify can diminish the stimulatory effect of stimulants.  Advised her about serotonin syndrome when taken in combination with her antidepressants. Will increase Vyvanse to 30 mg p.o. daily.

## 2018-11-21 ENCOUNTER — Other Ambulatory Visit

## 2018-12-02 ENCOUNTER — Telehealth: Payer: Self-pay

## 2018-12-02 NOTE — Telephone Encounter (Signed)
received a copy of the notes from [place] attention  

## 2018-12-15 ENCOUNTER — Encounter: Admitting: Obstetrics & Gynecology

## 2018-12-17 ENCOUNTER — Telehealth: Payer: Self-pay

## 2018-12-17 DIAGNOSIS — F900 Attention-deficit hyperactivity disorder, predominantly inattentive type: Secondary | ICD-10-CM

## 2018-12-17 MED ORDER — LISDEXAMFETAMINE DIMESYLATE 30 MG PO CAPS: 30.0000 mg | ORAL_CAPSULE | Freq: Every day | ORAL | 0 refills | Status: DC

## 2018-12-17 NOTE — Telephone Encounter (Signed)
Sent Vyvanse to pharmacy.

## 2018-12-17 NOTE — Telephone Encounter (Signed)
Pt call states she needs refills on her vyvanse  lisdexamfetamine (VYVANSE) 30 MG capsule Medication Date: 11/12/2018 Department: [facility] Ordering/Authorizing: Ursula Alert, MD  Order Providers  Prescribing Provider Encounter Provider  Ursula Alert, MD Carlynn Purl, CMA  Outpatient Medication Detail   Disp Refills Start End   lisdexamfetamine (VYVANSE) 30 MG capsule 30 capsule 0 11/12/2018    Sig - Route: Take 1 capsule (30 mg total) by mouth daily. - Oral   Sent to pharmacy as: lisdexamfetamine (VYVANSE) 30 MG capsule   Earliest Fill Date: 11/12/2018   E-Prescribing Status: Receipt confirmed by pharmacy (11/12/2018 12:18 PM EDT)   Associated Diagnoses  Attention deficit hyperactivity disorder (ADHD), predominantly inattentive type     Pharmacy  [facility] [place], [place]

## 2018-12-17 NOTE — Addendum Note (Signed)
Addended byUrsula Alert on: 12/17/2018 12:07 PM   Modules accepted: Orders

## 2018-12-31 ENCOUNTER — Other Ambulatory Visit: Payer: Self-pay

## 2019-01-01 ENCOUNTER — Telehealth: Payer: Self-pay | Admitting: *Deleted

## 2019-01-01 ENCOUNTER — Ambulatory Visit: Admitting: Obstetrics & Gynecology

## 2019-01-01 MED ORDER — NORETHINDRONE 0.35 MG PO TABS: 1.0000 | ORAL_TABLET | Freq: Every day | ORAL | 0 refills | Status: DC

## 2019-01-01 NOTE — Telephone Encounter (Signed)
Patient had to cancel annual exam for today due to covid exposure, she is not having symptoms. Patient asked if birth control pills could be sent to pharmacy while she is under quarantine. Rx sent patient aware.

## 2019-01-02 ENCOUNTER — Other Ambulatory Visit: Payer: Self-pay | Admitting: Obstetrics & Gynecology

## 2019-01-14 ENCOUNTER — Encounter: Payer: Self-pay | Admitting: Psychiatry

## 2019-01-14 ENCOUNTER — Other Ambulatory Visit: Payer: Self-pay

## 2019-01-14 ENCOUNTER — Ambulatory Visit (INDEPENDENT_AMBULATORY_CARE_PROVIDER_SITE_OTHER): Admitting: Psychiatry

## 2019-01-14 DIAGNOSIS — F411 Generalized anxiety disorder: Secondary | ICD-10-CM

## 2019-01-14 DIAGNOSIS — F5081 Binge eating disorder: Secondary | ICD-10-CM

## 2019-01-14 DIAGNOSIS — F5105 Insomnia due to other mental disorder: Secondary | ICD-10-CM | POA: Diagnosis not present

## 2019-01-14 DIAGNOSIS — F3177 Bipolar disorder, in partial remission, most recent episode mixed: Secondary | ICD-10-CM | POA: Insufficient documentation

## 2019-01-14 DIAGNOSIS — F900 Attention-deficit hyperactivity disorder, predominantly inattentive type: Secondary | ICD-10-CM | POA: Diagnosis not present

## 2019-01-14 DIAGNOSIS — F3131 Bipolar disorder, current episode depressed, mild: Secondary | ICD-10-CM | POA: Diagnosis not present

## 2019-01-14 MED ORDER — LAMOTRIGINE 100 MG PO TABS: 100.0000 mg | ORAL_TABLET | Freq: Every day | ORAL | 0 refills | Status: DC

## 2019-01-14 MED ORDER — ARIPIPRAZOLE 30 MG PO TABS: 30.0000 mg | ORAL_TABLET | Freq: Every day | ORAL | 0 refills | Status: DC

## 2019-01-14 MED ORDER — LISDEXAMFETAMINE DIMESYLATE 30 MG PO CAPS: 30.0000 mg | ORAL_CAPSULE | Freq: Every day | ORAL | 0 refills | Status: DC

## 2019-01-14 MED ORDER — LITHIUM CARBONATE ER 300 MG PO TBCR: EXTENDED_RELEASE_TABLET | ORAL | 0 refills | Status: DC

## 2019-01-14 MED ORDER — LAMOTRIGINE 25 MG PO TABS: 50.0000 mg | ORAL_TABLET | Freq: Every day | ORAL | 0 refills | Status: DC

## 2019-01-14 MED ORDER — BUPROPION HCL ER (XL) 150 MG PO TB24: 150.0000 mg | ORAL_TABLET | Freq: Every day | ORAL | 0 refills | Status: DC

## 2019-01-14 NOTE — Progress Notes (Signed)
Virtual Visit via Video Note  I connected with Sierra [name] on 01/14/19 at  8:30 AM EST by a video enabled telemedicine application and verified that I am speaking with the correct person using two identifiers.   I discussed the limitations of evaluation and management by telemedicine and the availability of in person appointments. The patient expressed understanding and agreed to proceed.     I discussed the assessment and treatment plan with the patient. The patient was provided an opportunity to ask questions and all were answered. The patient agreed with the plan and demonstrated an understanding of the instructions.   The patient was advised to call back or seek an in-person evaluation if the symptoms worsen or if the condition fails to improve as anticipated.   [facility] MD OP Progress Note  01/14/2019 12:10 PM GARGI BERCH  MRN:  [id number]  Chief Complaint:  Chief Complaint    Follow-up     HPI: Sierra [name] is a 38 year old Caucasian female, married, lives in [place], has a history of GAD, bipolar disorder, binge eating disorder, ADHD was evaluated by telemedicine today.  Patient today reports she has been feeling depressed since the past few weeks.  She reports she feels sad and has been sleeping excessively.  She also struggles with lack of motivation.  This has been getting worse since the past couple of days.  Patient reports she is compliant on all her medications.  She reports she completed her semester which was a combination of 6 classes.  She reports she is currently on a break.  She starts classes back on June 11.  She looks forward to that.  She did well in her classes.  She reports that Vyvanse did help initially however now she feels it is not as effective as it used to be.  Patient denies any side effects to medications.  She denies any suicidality, homicidality or perceptual disturbances.  She continues to be in psychotherapy sessions and reports  upcoming therapy sessions on January 18.  She denies any other concerns today. Visit Diagnosis:    ICD-10-CM   1. Bipolar disorder, current episode depressed, mild (HCC)  F31.31 buPROPion (WELLBUTRIN XL) 150 MG 24 hr tablet    ARIPiprazole (ABILIFY) 30 MG tablet    lamoTRIgine (LAMICTAL) 100 MG tablet    lamoTRIgine (LAMICTAL) 25 MG tablet    lithium carbonate (LITHOBID) 300 MG CR tablet  2. GAD (generalized anxiety disorder)  F41.1 ARIPiprazole (ABILIFY) 30 MG tablet    lamoTRIgine (LAMICTAL) 100 MG tablet    lamoTRIgine (LAMICTAL) 25 MG tablet    lithium carbonate (LITHOBID) 300 MG CR tablet  3. Attention deficit hyperactivity disorder (ADHD), predominantly inattentive type  F90.0 lisdexamfetamine (VYVANSE) 30 MG capsule  4. Insomnia due to mental condition  F51.05   5. Binge eating disorder  F50.81     Past Psychiatric History: I have reviewed past psychiatric history from my progress note on 06/20/2017.  Past Medical History:  Past Medical History:  Diagnosis Date  . Abdominal pain, other specified site   . ADHD   . Affective bipolar disorder ([place]) 10/12/2011  . Anemia   . Anxiety state, unspecified   . Bipolar 1 disorder, depressed ([place]) 05/21/2017  . BIPOLAR AFFECTIVE DISORDER 12/23/2006   Qualifier: Diagnosis of  By: Diona Browner MD, Amy    . Bipolar affective disorder ([place]) 10/12/2011  . Bipolar disorder, unspecified ([place])   . Depressive disorder, not elsewhere classified   . Edema   . Esophageal  reflux   . Fibromyalgia   . Headache   . History of kidney stones   . Mild or unspecified pre-eclampsia, unspecified as to episode of care   . Morbid obesity (HCC)   . Myalgia and myositis, unspecified   . Other dyspnea and respiratory abnormality   . Preeclampsia    with first pregnancy  . Raynaud's disease   . Urinary tract infection, site not specified     Past Surgical History:  Procedure Laterality Date  . CESAREAN SECTION     X 2  . CHOLECYSTECTOMY  03/2006  .  CYSTOSCOPY W/ URETERAL STENT PLACEMENT Left 05/14/2016   Procedure: CYSTOSCOPY WITH STENT REPLACEMENT;  Surgeon: [name], MD;  Location: [facility] ORS;  Service: Urology;  Laterality: Left;  . CYSTOSCOPY WITH STENT PLACEMENT Left 04/25/2016   Procedure: CYSTOSCOPY WITH STENT PLACEMENT;  Surgeon: [name], MD;  Location: [facility];  Service: Urology;  Laterality: Left;  . DILATION AND CURETTAGE OF UTERUS    . IVC FILTER INSERTION  03/26/2016   [facility]  . LAPAROSCOPIC GASTRIC RESTRICTIVE DUODENAL PROCEDURE (DUODENAL SWITCH)  03/2016  . LITHOTRIPSY    . TONSILLECTOMY AND ADENOIDECTOMY    . URETEROSCOPY WITH HOLMIUM LASER LITHOTRIPSY Left 05/14/2016   Procedure: URETEROSCOPY WITH HOLMIUM LASER LITHOTRIPSY;  Surgeon: [name], MD;  Location: [facility] OR[facility];  Service: Urology;  Laterality: Left;    Family Psychiatric History: I have reviewed family psychiatric history from my progress note on 06/20/2017.  Family History:  Family History  Problem Relation Age of Onset  . Depression Father   . Alcohol abuse Father   . Depression Mother   . Hyperlipidemia Mother   . Sleep apnea Mother   . Depression Sister   . Hyperlipidemia Brother   . Depression Brother   . Hyperlipidemia Brother   . Depression Brother   . Alcohol abuse Brother   . Coronary artery disease Maternal Grandmother   . Heart attack Maternal Grandmother   . Diabetes Maternal Grandmother   . Lung cancer Maternal Grandfather   . Emphysema Maternal Grandfather   . Coronary artery disease Maternal Grandfather   . Lupus Other        Aunt  . Fibromyalgia Other        Aunt    Social History: Reviewed social history from my progress note on 06/20/2017. Social History   Socioeconomic History  . Marital status: Married    Spouse name: brian  . Number of children: 2  . Years of education: Not on file  . Highest education level: Some college, no degree  Occupational History  . Occupation: Home maker  Tobacco Use  .  Smoking status: Never Smoker  . Smokeless tobacco: Never Used  Substance and Sexual Activity  . Alcohol use: Yes    Alcohol/week: 2.0 - 3.0 standard drinks    Types: 2 Glasses of wine per week  . Drug use: No  . Sexual activity: Not Currently    Partners: Male    Birth control/protection: None, I.U.D.  Other Topics Concern  . Not on file  Social History Narrative   1 child, 2 step-sons      Regular exercise-no   Diet: no fast food, likes sweets   Social Determinants of Health   Financial Resource Strain:   . Difficulty of Paying Living Expenses: Not on file  Food Insecurity:   . Worried About Programme researcher, broadcasting/film/video in the Last Year: Not on file  . Ran Out of  Food in the Last Year: Not on file  Transportation Needs:   . Lack of Transportation (Medical): Not on file  . Lack of Transportation (Non-Medical): Not on file  Physical Activity:   . Days of Exercise per Week: Not on file  . Minutes of Exercise per Session: Not on file  Stress:   . Feeling of Stress : Not on file  Social Connections:   . Frequency of Communication with Friends and Family: Not on file  . Frequency of Social Gatherings with Friends and Family: Not on file  . Attends Religious Services: Not on file  . Active Member of Clubs or Organizations: Not on file  . Attends Banker Meetings: Not on file  . Marital Status: Not on file    Allergies:  Allergies  Allergen Reactions  . Uncaria Tomentosa (Cats Claw) Other (See Comments)    allgeric to cats in general causes sneezing  . Bee Pollen Itching  . Molds & Smuts Other (See Comments)  . Pollen Extract   . Milk-Related Compounds     Able to tolerate yogurt; mild intolerance to milk/cheese  . Pineapple Rash  . Soy Allergy Diarrhea  . Strawberry Extract Rash    Break out on tongue noted Break out on tongue noted Break out on tongue noted    Metabolic Disorder Labs: Lab Results  Component Value Date   HGBA1C 4.7 (L) 05/22/2017   MPG  88.19 05/22/2017   Lab Results  Component Value Date   PROLACTIN 7.6 11/07/2018   Lab Results  Component Value Date   CHOL 105 05/22/2017   TRIG 41 05/22/2017   HDL 48 05/22/2017   CHOLHDL 2.2 05/22/2017   VLDL 8 05/22/2017   LDLCALC 49 05/22/2017   LDLCALC 99 09/11/2013   Lab Results  Component Value Date   TSH 0.504 05/21/2018   TSH 0.677 05/22/2017    Therapeutic Level Labs: Lab Results  Component Value Date   LITHIUM 0.1 (L) 11/07/2018   LITHIUM 0.3 (L) 11/26/2017   No results found for: VALPROATE No components found for:  CBMZ  Current Medications: Current Outpatient Medications  Medication Sig Dispense Refill  . ARIPiprazole (ABILIFY) 30 MG tablet Take 1 tablet (30 mg total) by mouth daily. 90 tablet 0  . Biotin 5000 MCG TABS Take 1 tablet by mouth daily.    Marland Kitchen buPROPion (WELLBUTRIN XL) 150 MG 24 hr tablet Take 1 tablet (150 mg total) by mouth daily. 90 tablet 0  . calcium citrate-vitamin D 500-400 MG-UNIT chewable tablet Chew 1 tablet by mouth daily.     Marland Kitchen docusate sodium (COLACE) 100 MG capsule Take 300 mg by mouth daily.    . eszopiclone (LUNESTA) 2 MG TABS tablet Take 1 tablet (2 mg total) by mouth at bedtime. Take immediately before bedtime (Patient not taking: Reported on 11/03/2018) 30 tablet 1  . ferrous sulfate 325 (65 FE) MG tablet Take 325 mg by mouth daily with breakfast.    . hydrOXYzine (ATARAX/VISTARIL) 25 MG tablet Take 1 tablet (25 mg total) by mouth daily as needed for anxiety. 90 tablet 1  . lamoTRIgine (LAMICTAL) 100 MG tablet Take 1 tablet (100 mg total) by mouth daily. 90 tablet 0  . lamoTRIgine (LAMICTAL) 25 MG tablet Take 2 tablets (50 mg total) by mouth daily. To be added to 100 mg daily 180 tablet 0  . levonorgestrel (MIRENA) 20 MCG/24HR IUD 1 each by Intrauterine route once.    Melene Muller ON 01/15/2019] lisdexamfetamine (VYVANSE)  30 MG capsule Take 1 capsule (30 mg total) by mouth daily. 30 capsule 0  . lithium carbonate (LITHOBID) 300 MG CR  tablet TAKE 1 TABLET BY MOUTH EVERY DAY WITH SUPPER 90 tablet 0  . meloxicam (MOBIC) 15 MG tablet Take 1 tablet by mouth 1 day or 1 dose.    . montelukast (SINGULAIR) 10 MG tablet Take 10 mg by mouth daily.    . Multiple Vitamin (MULTIVITAMIN) tablet Take 1 tablet by mouth daily.    Marland Kitchen. NIFEdipine (PROCARDIA-XL/ADALAT CC) 60 MG 24 hr tablet Take 60 mg by mouth daily.     . norethindrone (MICRONOR) 0.35 MG tablet Take 1 tablet (0.35 mg total) by mouth daily. 3 Package 0  . omeprazole (PRILOSEC) 20 MG capsule Take 20 mg by mouth daily.    Marland Kitchen. PARAGARD INTRAUTERINE COPPER IUD IUD by Intrauterine route.     No current facility-administered medications for this visit.     Musculoskeletal: Strength & Muscle Tone: UTA Gait & Station: normal Patient leans: N/A  Psychiatric Specialty Exam: Review of Systems  Psychiatric/Behavioral: Positive for dysphoric mood.  All other systems reviewed and are negative.   There were no vitals taken for this visit.There is no height or weight on file to calculate BMI.  General Appearance: Casual  Eye Contact:  Fair  Speech:  Clear and Coherent  Volume:  Normal  Mood:  Depressed  Affect:  Congruent  Thought Process:  Goal Directed and Descriptions of Associations: Intact  Orientation:  Full (Time, Place, and Person)  Thought Content: Logical   Suicidal Thoughts:  No  Homicidal Thoughts:  No  Memory:  Immediate;   Fair Recent;   Fair Remote;   Fair  Judgement:  Fair  Insight:  Fair  Psychomotor Activity:  Normal  Concentration:  Concentration: Fair and Attention Span: Fair  Recall:  FiservFair  Fund of Knowledge: Fair  Language: Fair  Akathisia:  No  Handed:  Right  AIMS (if indicated): denies tremors, rigidity  Assets:  Communication Skills Desire for Improvement Housing Social Support  ADL's:  Intact  Cognition: WNL  Sleep:  Excessive   Screenings: AIMS     [facility]  [facility]2017 [facility] Ad[facility] (Discharged) [facility]  AIMS Total Score  1  7  5   0    AUDIT     Ad[facility]Discharged[facility]  Alcohol Use Disorder Identification Test Final Score (AUDIT)  6       Assessment and Plan: Sierra [name] is a 38 year old Caucasian female who has a history of bipolar disorder, anxiety disorder, binge eating disorder was evaluated by telemedicine today.  Patient is currently struggling with depressive symptoms and will benefit from medication readjustment.  Plan as noted below.  Plan Bipolar disorder current episode depressed-mild-unstable Lamotrigine 150 mg p.o. daily Lithium 300 mg p.o. nightly-reduce dosage Reviewed and discussed lithium levels-dated 11/07/2018-0.1-subtherapeutic.  Patient reports she has been compliant on it.  Will not make any changes today since she had troubles on higher dosages of lithium. Abilify 30 mg p.o. daily Increase Wellbutrin to XL 150 mg p.o. daily in the morning  Anxiety disorder-stable Hydroxyzine as needed Continue CBT  Insomnia-unstable Patient today reports she has been sleeping excessively. She will work on her sleep hygiene. She is currently not compliant on the Lunesta.  ADHD  inattentive type-some progress Vyvanse 30 mg p.o. daily I have provided 1 prescription with date specified to be filled on or after December 31. I have reviewed [place] controlled substance database.  Binge eating disorder in remission We will continue to monitor closely  I have reviewed the following labs and discussed with patient-prolactin-11/07/2018-7.6-within normal limits, BMP-within normal limits.  Patient advised to call her psychotherapist to schedule a sooner appointment.  Follow-up in clinic in 2 to 3 weeks or sooner if needed.  January 22  at 9 AM  I have spent atleast 25 minutes non  face to face with patient today. More than 50 % of the time was spent for psychoeducation and supportive psychotherapy and care coordination. This note was generated in part or whole with voice recognition software. Voice recognition is usually quite accurate but there are transcription errors that can and very often do occur. I apologize for any typographical errors that were not detected and corrected.         Jomarie Longs, MD 01/14/2019, 12:10 PM

## 2019-01-25 IMAGING — CR DG ABDOMEN 1V
1 series · 2 of 2 positions shown · non-contrast
Comparison: CT scan dated 04/25/2016

CLINICAL DATA: Nephrolithiasis.

EXAM:
ABDOMEN - 1 VIEW

[Series 1: dg abd 1 view · 0.14mm/px · 2 of 2 slices shown]
[im 1/2]
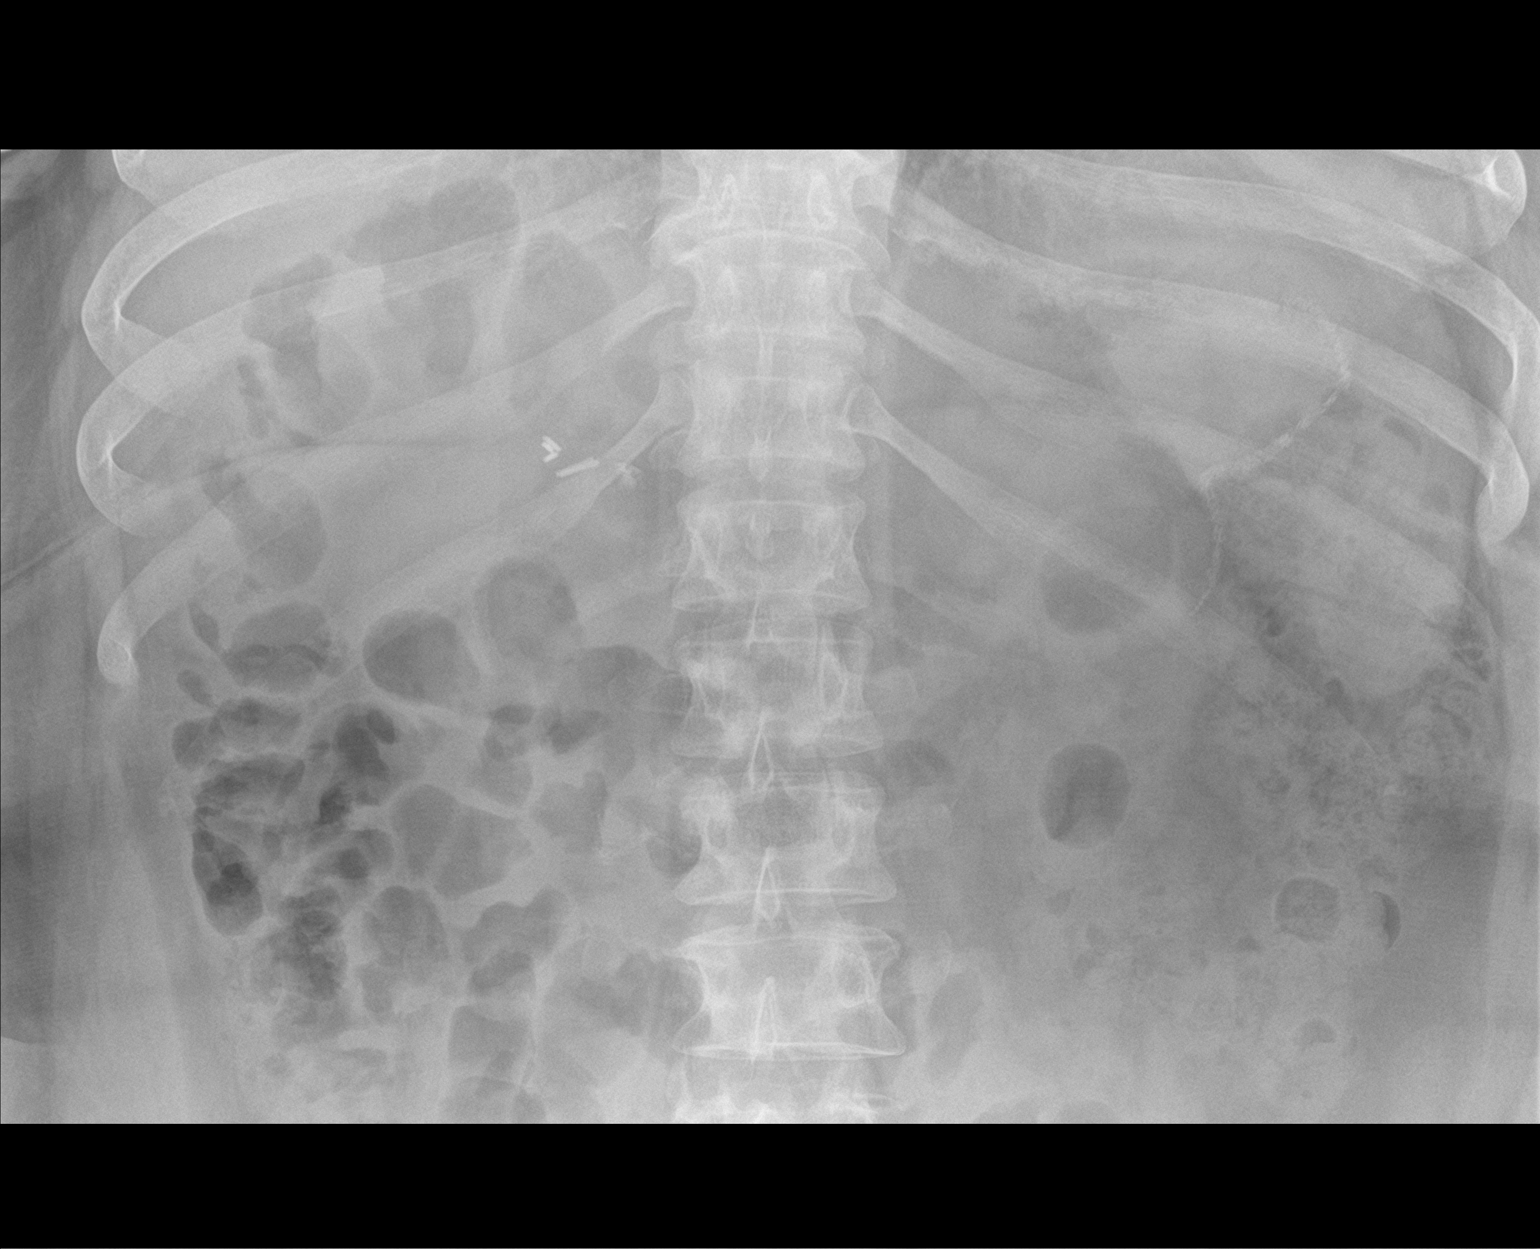
[im 2/2]
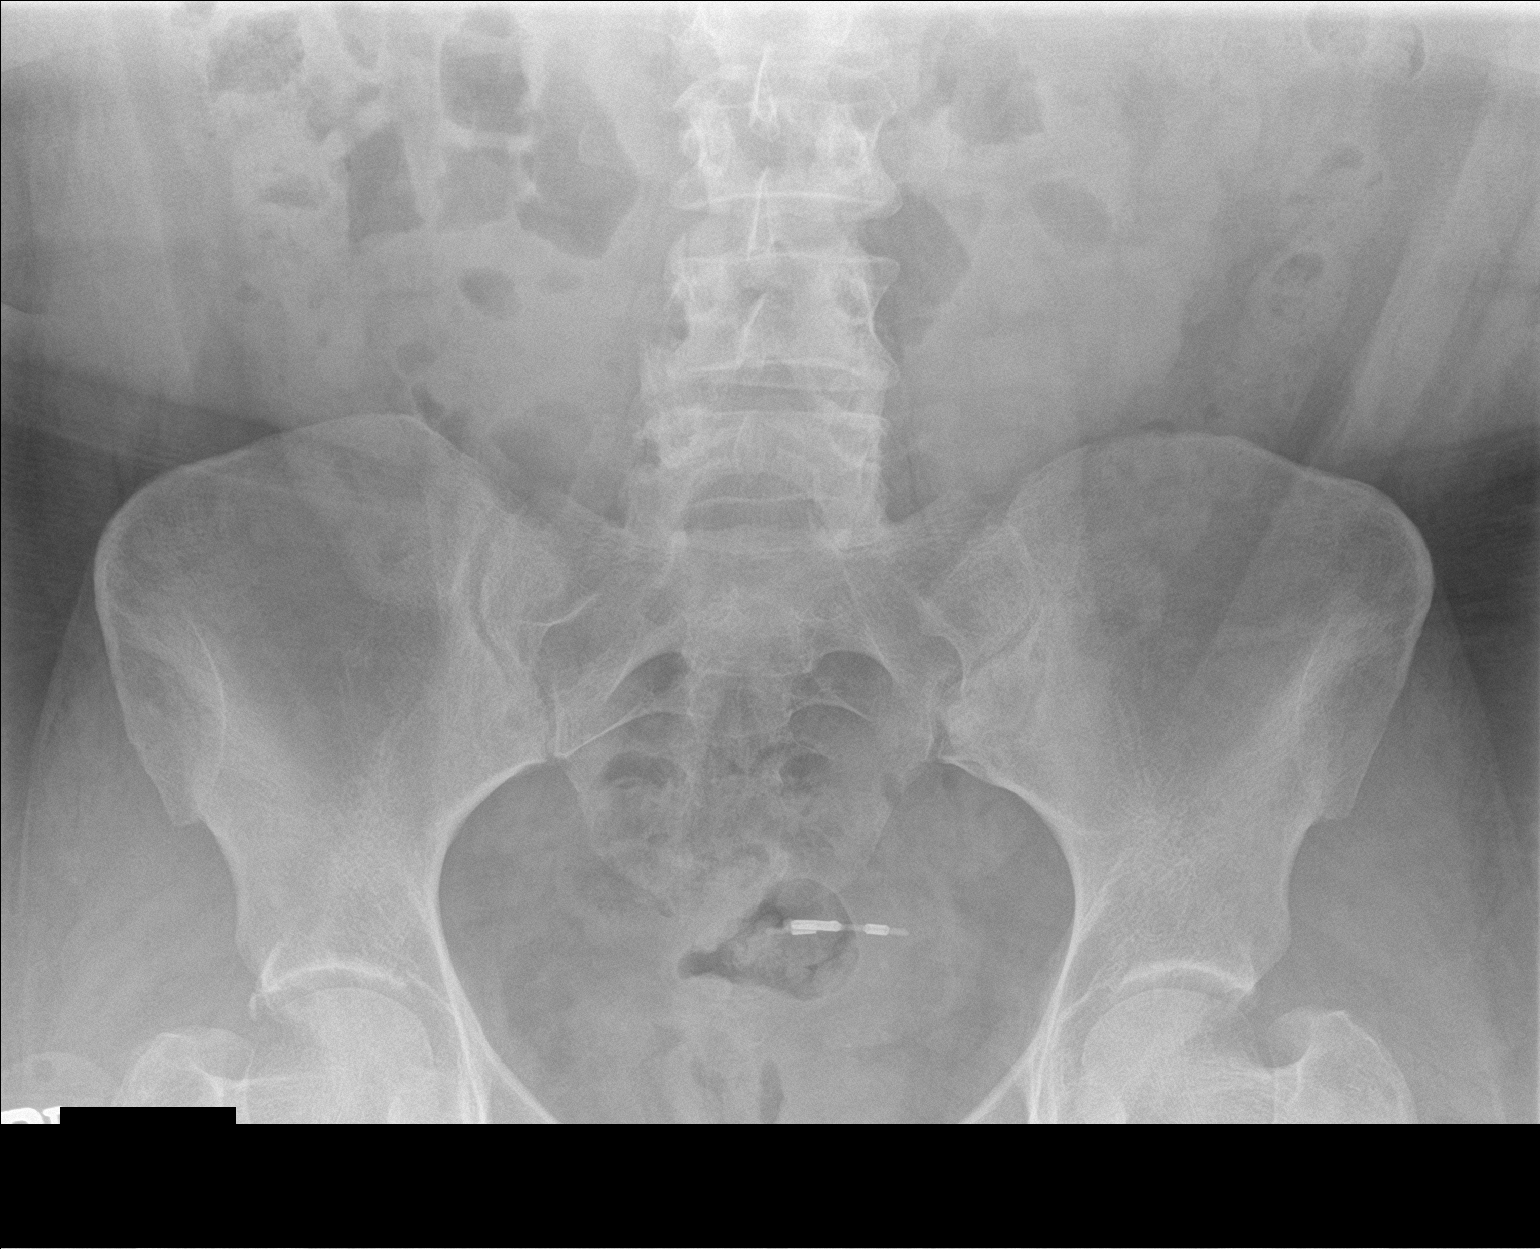

[2 of 2 positions shown; findings below may reference images not displayed]

FINDINGS: There are no visible renal or ureteral calculi. Several faint
calcifications in the left side of the pelvis most likely represent
phleboliths.

Bowel gas pattern is normal. IUD in place. Surgical clips from
previous cholecystectomy. Surgical staples in the left upper
quadrant from previous bowel surgery.
IMPRESSION: Benign-appearing abdomen and pelvis. No visible residual
nephrolithiasis.

## 2019-02-03 ENCOUNTER — Inpatient Hospital Stay: Attending: Oncology

## 2019-02-03 ENCOUNTER — Other Ambulatory Visit: Payer: Self-pay

## 2019-02-03 DIAGNOSIS — D509 Iron deficiency anemia, unspecified: Secondary | ICD-10-CM | POA: Insufficient documentation

## 2019-02-03 LAB — IRON AND TIBC
Iron: 53 ug/dL (ref 28–170)
Saturation Ratios: 15 % (ref 10.4–31.8)
TIBC: 350 ug/dL (ref 250–450)
UIBC: 297 ug/dL

## 2019-02-03 LAB — CBC WITH DIFFERENTIAL/PLATELET
Abs Immature Granulocytes: 0.01 10*3/uL (ref 0.00–0.07)
Basophils Absolute: 0.1 10*3/uL (ref 0.0–0.1)
Basophils Relative: 1 %
Eosinophils Absolute: 0.2 10*3/uL (ref 0.0–0.5)
Eosinophils Relative: 2 %
HCT: 43.8 % (ref 36.0–46.0)
Hemoglobin: 14.1 g/dL (ref 12.0–15.0)
Immature Granulocytes: 0 %
Lymphocytes Relative: 21 %
Lymphs Abs: 1.3 10*3/uL (ref 0.7–4.0)
MCH: 29.7 pg (ref 26.0–34.0)
MCHC: 32.2 g/dL (ref 30.0–36.0)
MCV: 92.2 fL (ref 80.0–100.0)
Monocytes Absolute: 0.4 10*3/uL (ref 0.1–1.0)
Monocytes Relative: 6 %
Neutro Abs: 4.4 10*3/uL (ref 1.7–7.7)
Neutrophils Relative %: 70 %
Platelets: 334 10*3/uL (ref 150–400)
RBC: 4.75 MIL/uL (ref 3.87–5.11)
RDW: 12.9 % (ref 11.5–15.5)
WBC: 6.3 10*3/uL (ref 4.0–10.5)
nRBC: 0 % (ref 0.0–0.2)

## 2019-02-03 LAB — FERRITIN: Ferritin: 40 ng/mL (ref 11–307)

## 2019-02-04 ENCOUNTER — Other Ambulatory Visit: Payer: Self-pay

## 2019-02-05 ENCOUNTER — Encounter: Payer: Self-pay | Admitting: Obstetrics & Gynecology

## 2019-02-05 ENCOUNTER — Ambulatory Visit (INDEPENDENT_AMBULATORY_CARE_PROVIDER_SITE_OTHER): Admitting: Obstetrics & Gynecology

## 2019-02-05 VITALS — BP 128/86

## 2019-02-05 DIAGNOSIS — Z975 Presence of (intrauterine) contraceptive device: Secondary | ICD-10-CM

## 2019-02-05 DIAGNOSIS — N921 Excessive and frequent menstruation with irregular cycle: Secondary | ICD-10-CM | POA: Diagnosis not present

## 2019-02-05 MED ORDER — NORETHIN ACE-ETH ESTRAD-FE 1-20 MG-MCG(24) PO TABS: 1.0000 | ORAL_TABLET | Freq: Every day | ORAL | 4 refills | Status: DC

## 2019-02-05 NOTE — Progress Notes (Signed)
    Sierra [name] August 30, 1980 [id number]        39 y.o.  [id number] stable boyfriend  RP: Breakthrough bleeding on Mirena IUD and partner felt the IUD strings  HPI: New Mirena IUD inserted in August 2020.  Has had mild but frequent breakthrough bleeding on this new Mirena IUD.  No pelvic pain.  No abnormal vaginal discharge.  Last time patient was sexually active her partner felt the IUD strings, would like trimming of the strings.   OB History  Gravida Para Term Preterm AB Living  5 2     2 2   SAB TAB Ectopic Multiple Live Births  2            # Outcome Date GA Lbr Len/2nd Weight Sex Delivery Anes PTL Lv  5 Gravida           4 SAB           3 SAB           2 Para           1 Para             Past medical history,surgical history, problem list, medications, allergies, family history and social history were all reviewed and documented in the EPIC chart.   Directed ROS with pertinent positives and negatives documented in the history of present illness/assessment and plan.  Exam:  Vitals:   02/05/19 0946  BP: 128/86   General appearance:  Normal  Abdomen: Normal  Gynecologic exam: Vulva normal.  Speculum:  IUD strings visible at the EO.  Trimmed.  Bimanual exam:  Uterus AV, normal volume, mobile, NT.  No adnexal mass, NT bilaterally.  Hb 02/03/19 at 14.1   Assessment/Plan:  39 y.o. 31   1. Breakthrough bleeding associated with intrauterine device (IUD) Persistent mild but frequent breakthrough bleeding on new Mirena IUD since August 2020.  Mirena IUD in good position.  IUD strings trimmed because partner felt it.  No pelvic pain.  Patient prefers to continue on Mirena IUD but would like to control bleeding for a while on a low-dose birth control pill.  No contraindication to add a birth control pill.  Birth control pill usage discussed and norethindrone ethinyl estradiol 1/20 FE prescribed.  Follow-up with a pelvic ultrasound to complete the investigation and rule out  any intra uterine pathology such as endometrial polyps, submucosal fibroid, endometrial hyperplasia and endometrial cancer. - 2/20 Transvaginal Non-OB; Future  Other orders - Norethindrone Acetate-Ethinyl Estrad-FE (LOESTRIN 24 FE) 1-20 MG-MCG(24) tablet; Take 1 tablet by mouth daily.  [place] MD, 10:18 AM 02/05/2019

## 2019-02-06 ENCOUNTER — Encounter: Payer: Self-pay | Admitting: Psychiatry

## 2019-02-06 ENCOUNTER — Ambulatory Visit (INDEPENDENT_AMBULATORY_CARE_PROVIDER_SITE_OTHER): Admitting: Psychiatry

## 2019-02-06 ENCOUNTER — Other Ambulatory Visit: Payer: Self-pay

## 2019-02-06 DIAGNOSIS — F411 Generalized anxiety disorder: Secondary | ICD-10-CM | POA: Diagnosis not present

## 2019-02-06 DIAGNOSIS — F5105 Insomnia due to other mental disorder: Secondary | ICD-10-CM | POA: Diagnosis not present

## 2019-02-06 DIAGNOSIS — F3131 Bipolar disorder, current episode depressed, mild: Secondary | ICD-10-CM | POA: Diagnosis not present

## 2019-02-06 DIAGNOSIS — F900 Attention-deficit hyperactivity disorder, predominantly inattentive type: Secondary | ICD-10-CM

## 2019-02-06 DIAGNOSIS — F5081 Binge eating disorder: Secondary | ICD-10-CM

## 2019-02-06 DIAGNOSIS — Z79899 Other long term (current) drug therapy: Secondary | ICD-10-CM

## 2019-02-06 MED ORDER — AMPHETAMINE-DEXTROAMPHETAMINE 10 MG PO TABS: 15.0000 mg | ORAL_TABLET | Freq: Every day | ORAL | 0 refills | Status: DC

## 2019-02-06 NOTE — Progress Notes (Signed)
Virtual Visit via Video Note  I connected with Sierra [name] on 02/06/19 at  9:00 AM EST by a video enabled telemedicine application and verified that I am speaking with the correct person using two identifiers.   I discussed the limitations of evaluation and management by telemedicine and the availability of in person appointments. The patient expressed understanding and agreed to proceed.     I discussed the assessment and treatment plan with the patient. The patient was provided an opportunity to ask questions and all were answered. The patient agreed with the plan and demonstrated an understanding of the instructions.   The patient was advised to call back or seek an in-person evaluation if the symptoms worsen or if the condition fails to improve as anticipated.   BH MD OP Progress Note  02/06/2019 12:22 PM KAURI GARSON  MRN:  [id number]  Chief Complaint:  Chief Complaint    Follow-up     HPI: Sierra [name] is a 39 year old Caucasian female, married, lives in [place], has a history of GAD, bipolar disorder, binge eating disorder, ADHD was evaluated by telemedicine today.  Patient today reports she continues to feel down, feels as though there is a cloud over her head.  She reports this has been going on since November.  She reports the Wellbutrin increased dosage did not help much.  She also reports she continues to struggle with her focus and concentration.  She struggles with lack of motivation, lack of interest.  This has been getting worse since the past few weeks.  She reports sleep as good on the [place].  Patient denies any suicidality, homicidality or perceptual disturbances.  Patient reports she continues to work with her therapist and therapy sessions is going well.  Patient denies any other concerns today. Visit Diagnosis:    ICD-10-CM   1. Bipolar disorder, current episode depressed, mild (HCC)  F31.31   2. GAD (generalized anxiety disorder)  F41.1   3.  Attention deficit hyperactivity disorder (ADHD), predominantly inattentive type  F90.0 amphetamine-dextroamphetamine (ADDERALL) 10 MG tablet  4. Insomnia due to mental condition  F51.05   5. High risk medication use  Z79.899 EKG 12-Lead  6. Binge eating disorder  F50.81     Past Psychiatric History: I have reviewed past psychiatric history from my progress note on 06/20/2017.  Past Medical History:  Past Medical History:  Diagnosis Date  . Abdominal pain, other specified site   . ADHD   . 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 Anemia   . Anxiety state, unspecified   . Bipolar 1 disorder, depressed (HCC) 05/21/2017  . BIPOLAR AFFECTIVE DISORDER 12/23/2006   Qualifier: Diagnosis of  By: Ermalene Searing MD, Amy    . Bipolar affective disorder (HCC) 10/12/2011  . Bipolar disorder, unspecified (HCC)   . Depressive disorder, not elsewhere classified   . Edema   . Esophageal reflux   . Fibromyalgia   . Headache   . History of kidney stones   . Mild or unspecified pre-eclampsia, unspecified as to episode of care   . Morbid obesity (HCC)   . Myalgia and myositis, unspecified   . Other dyspnea and respiratory abnormality   . Preeclampsia    with first pregnancy  . Raynaud's disease   . Urinary tract infection, site not specified     Past Surgical History:  Procedure Laterality Date  . CESAREAN SECTION     X 2  . CHOLECYSTECTOMY  03/2006  . CYSTOSCOPY W/ URETERAL STENT PLACEMENT Left  05/14/2016   Procedure: CYSTOSCOPY WITH STENT REPLACEMENT;  Surgeon: [name], MD;  Location: [facility];  Service: Urology;  Laterality: Left;  . CYSTOSCOPY WITH STENT PLACEMENT Left 04/25/2016   Procedure: CYSTOSCOPY WITH STENT PLACEMENT;  Surgeon: [name], MD;  Location: [facility];  Service: Urology;  Laterality: Left;  . DILATION AND CURETTAGE OF UTERUS    . IVC FILTER INSERTION  03/26/2016   [facility]  . LAPAROSCOPIC GASTRIC RESTRICTIVE DUODENAL PROCEDURE (DUODENAL SWITCH)  03/2016  .  LITHOTRIPSY    . TONSILLECTOMY AND ADENOIDECTOMY    . URETEROSCOPY WITH HOLMIUM LASER LITHOTRIPSY Left 05/14/2016   Procedure: URETEROSCOPY WITH HOLMIUM LASER LITHOTRIPSY;  Surgeon: [name], MD;  Location: [facility];  Service: Urology;  Laterality: Left;    Family Psychiatric History: I have reviewed family psychiatric history from my progress note on 06/20/2017.  Family History:  Family History  Problem Relation Age of Onset  . Depression Father   . Alcohol abuse Father   . Depression Mother   . Hyperlipidemia Mother   . Sleep apnea Mother   . Depression Sister   . Hyperlipidemia Brother   . Depression Brother   . Hyperlipidemia Brother   . Depression Brother   . Alcohol abuse Brother   . Coronary artery disease Maternal Grandmother   . Heart attack Maternal Grandmother   . Diabetes Maternal Grandmother   . Lung cancer Maternal Grandfather   . Emphysema Maternal Grandfather   . Coronary artery disease Maternal Grandfather   . Lupus Other        Aunt  . Fibromyalgia Other        Aunt    Social History: I have reviewed social history from my progress note on 06/20/2017. Social History   Socioeconomic History  . Marital status: Married    Spouse name: brian  . Number of children: 2  . Years of education: Not on file  . Highest education level: Some college, no degree  Occupational History  . Occupation: Home maker  Tobacco Use  . Smoking status: Never Smoker  . Smokeless tobacco: Never Used  Substance and Sexual Activity  . Alcohol use: Yes    Alcohol/week: 2.0 - 3.0 standard drinks    Types: 2 Glasses of wine per week  . Drug use: No  . Sexual activity: Not Currently    Partners: Male    Birth control/protection: None, I.U.D.  Other Topics Concern  . Not on file  Social History Narrative   1 child, 2 step-sons      Regular exercise-no   Diet: no fast food, likes sweets   Social Determinants of Health   Financial Resource Strain:   . Difficulty of  Paying Living Expenses: Not on file  Food Insecurity:   . Worried About Programme researcher, broadcasting/film/video in the Last Year: Not on file  . Ran Out of Food in the Last Year: Not on file  Transportation Needs:   . Lack of Transportation (Medical): Not on file  . Lack of Transportation (Non-Medical): Not on file  Physical Activity:   . Days of Exercise per Week: Not on file  . Minutes of Exercise per Session: Not on file  Stress:   . Feeling of Stress : Not on file  Social Connections:   . Frequency of Communication with Friends and Family: Not on file  . Frequency of Social Gatherings with Friends and Family: Not on file  . Attends Religious Services: Not on file  .  Active Member of Clubs or Organizations: Not on file  . Attends BankerClub or Organization Meetings: Not on file  . Marital Status: Not on file    Allergies:  Allergies  Allergen Reactions  . Uncaria Tomentosa (Cats Claw) Other (See Comments)    allgeric to cats in general causes sneezing  . Bee Pollen Itching  . Molds & Smuts Other (See Comments)  . Pollen Extract   . Milk-Related Compounds     Able to tolerate yogurt; mild intolerance to milk/cheese  . Pineapple Rash  . Soy Allergy Diarrhea  . Strawberry Extract Rash    Break out on tongue noted Break out on tongue noted Break out on tongue noted    Metabolic Disorder Labs: Lab Results  Component Value Date   HGBA1C 4.7 (L) 05/22/2017   MPG 88.19 05/22/2017   Lab Results  Component Value Date   PROLACTIN 7.6 11/07/2018   Lab Results  Component Value Date   CHOL 105 05/22/2017   TRIG 41 05/22/2017   HDL 48 05/22/2017   CHOLHDL 2.2 05/22/2017   VLDL 8 05/22/2017   LDLCALC 49 05/22/2017   LDLCALC 99 09/11/2013   Lab Results  Component Value Date   TSH 0.504 05/21/2018   TSH 0.677 05/22/2017    Therapeutic Level Labs: Lab Results  Component Value Date   LITHIUM 0.1 (L) 11/07/2018   LITHIUM 0.3 (L) 11/26/2017   No results found for: VALPROATE No components  found for:  CBMZ  Current Medications: Current Outpatient Medications  Medication Sig Dispense Refill  . amphetamine-dextroamphetamine (ADDERALL) 10 MG tablet Take 1.5 tablets (15 mg total) by mouth daily. Take 10 mg AM and 5 mg PM ( Prior to 4 pm) 21 tablet 0  . ARIPiprazole (ABILIFY) 30 MG tablet Take 1 tablet (30 mg total) by mouth daily. 90 tablet 0  . Biotin 5000 MCG TABS Take 1 tablet by mouth daily.    Marland Kitchen. buPROPion (WELLBUTRIN XL) 150 MG 24 hr tablet Take 1 tablet (150 mg total) by mouth daily. 90 tablet 0  . calcium citrate-vitamin D 500-400 MG-UNIT chewable tablet Chew 1 tablet by mouth daily.     Marland Kitchen. docusate sodium (COLACE) 100 MG capsule Take 300 mg by mouth daily.    . eszopiclone (LUNESTA) 2 MG TABS tablet Take 1 tablet (2 mg total) by mouth at bedtime. Take immediately before bedtime 30 tablet 1  . ferrous sulfate 325 (65 FE) MG tablet Take 325 mg by mouth daily with breakfast.    . hydrOXYzine (ATARAX/VISTARIL) 25 MG tablet Take 1 tablet (25 mg total) by mouth daily as needed for anxiety. 90 tablet 1  . lamoTRIgine (LAMICTAL) 100 MG tablet Take 1 tablet (100 mg total) by mouth daily. 90 tablet 0  . lamoTRIgine (LAMICTAL) 25 MG tablet Take 2 tablets (50 mg total) by mouth daily. To be added to 100 mg daily 180 tablet 0  . levonorgestrel (MIRENA) 20 MCG/24HR IUD 1 each by Intrauterine route once.    . lithium carbonate (LITHOBID) 300 MG CR tablet TAKE 1 TABLET BY MOUTH EVERY DAY WITH SUPPER 90 tablet 0  . montelukast (SINGULAIR) 10 MG tablet Take 10 mg by mouth daily.    . Multiple Vitamin (MULTIVITAMIN) tablet Take 1 tablet by mouth daily.    Marland Kitchen. NIFEdipine (PROCARDIA-XL/ADALAT CC) 60 MG 24 hr tablet Take 60 mg by mouth daily.     . norethindrone (MICRONOR) 0.35 MG tablet Take 1 tablet (0.35 mg total) by mouth daily. 3  Package 0  . Norethindrone Acetate-Ethinyl Estrad-FE (LOESTRIN 24 FE) 1-20 MG-MCG(24) tablet Take 1 tablet by mouth daily. 3 Package 4  . omeprazole (PRILOSEC) 20 MG  capsule Take 20 mg by mouth daily.     No current facility-administered medications for this visit.     Musculoskeletal: Strength & Muscle Tone: UTA Gait & Station: normal Patient leans: N/A  Psychiatric Specialty Exam: Review of Systems  Psychiatric/Behavioral: Positive for decreased concentration and dysphoric mood.  All other systems reviewed and are negative.   There were no vitals taken for this visit.There is no height or weight on file to calculate BMI.  General Appearance: Casual  Eye Contact:  Fair  Speech:  Normal Rate  Volume:  Normal  Mood:  Depressed "feels a cloud over my head "  Affect:  Congruent  Thought Process:  Goal Directed and Descriptions of Associations: Intact  Orientation:  Full (Time, Place, and Person)  Thought Content: Logical   Suicidal Thoughts:  No  Homicidal Thoughts:  No  Memory:  Immediate;   Fair Recent;   Fair Remote;   Fair  Judgement:  Fair  Insight:  Fair  Psychomotor Activity:  Normal  Concentration:  Concentration: Fair and Attention Span: Fair  Recall:  [employer] of Knowledge: Fair  Language: Fair  Akathisia:  No  Handed:  Right  AIMS (if indicated): Denies tremors, rigidity  Assets:  Communication Skills Desire for Improvement Housing Social Support  ADL's:  Intact  Cognition: WNL  Sleep:  Fair   Screenings: AIMS     Office Visit from 12/11/2017 in [facility] Office Visit from 11/29/2017 in [facility] Office Visit from 11/20/2017 in [facility] Admission (Discharged) from 05/21/2017 in [facility]  AIMS Total Score  1  7  5   0    AUDIT     Admission (Discharged) from 05/21/2017 in [facility]  Alcohol Use Disorder Identification Test Final Score (AUDIT)  6       Assessment and Plan: Javana is a 39 year old Caucasian female who has a history of bipolar disorder, anxiety disorder,  binge eating disorder was evaluated by telemedicine today.  Patient is currently struggling with depressive symptoms and concentration problems.  She will continue to benefit from medication readjustment.  Plan as noted below.  Plan Bipolar disorder current episode depressed-mild-unstable Lamotrigine 150 mg p.o. daily Lithium 300 mg p.o. nightly-reduced dosage. Lithium level dated 11/07/2018-0.1-subtherapeutic. Abilify 30 mg p.o. daily Wellbutrin XL 150 mg p.o. daily in the morning Advised patient to continue to work with her therapist.  Anxiety disorder-stable Hydroxyzine as needed Continue CBT  ADHD inattentive type-unstable She reports she started feeling more depressed since increasing the Vyvanse.  We will discontinue Vyvanse. Start Adderall 10 mg every morning and 5 mg every afternoon. Advised patient to monitor for side effects like sleep problems. I have reviewed [place] controlled substance database. I have reviewed blood pressure in E HR dated 02/05/2019-per Dr.Lavoie - 128/86- wnl  Discussed with patient to get EKG of her heart to monitor her cardiac health.  Will order EKG today.  Insomnia-stable Lunesta 2 mg p.o. nightly  Patient to continue psychotherapy sessions.  Follow-up in clinic in 2 weeks or sooner if needed.  February 5 at 11:40 am  I have spent atleast 30 minutes non face to face with patient today. More than 50 % of the time was spent for obtaining and to review and separately obtained history ,  ordering medications and test ,psychoeducation and supportive psychotherapy and care coordination,as well as documenting clinical information in electronic health record,interpreting results of test and communication of results This note was generated in part or whole with voice recognition software. Voice recognition is usually quite accurate but there are transcription errors that can and very often do occur. I apologize for any typographical errors that were not detected  and corrected.       Ursula Alert, MD 02/06/2019, 12:22 PM

## 2019-02-08 ENCOUNTER — Encounter: Payer: Self-pay | Admitting: Obstetrics & Gynecology

## 2019-02-08 NOTE — Patient Instructions (Signed)
1. Breakthrough bleeding associated with intrauterine device (IUD) Persistent mild but frequent breakthrough bleeding on new Mirena IUD since August 2020.  Mirena IUD in good position.  IUD strings trimmed because partner felt it.  No pelvic pain.  Patient prefers to continue on Mirena IUD but would like to control bleeding for a while on a low-dose birth control pill.  No contraindication to add a birth control pill.  Birth control pill usage discussed and norethindrone ethinyl estradiol 1/20 FE prescribed.  Follow-up with a pelvic ultrasound to complete the investigation and rule out any intra uterine pathology such as endometrial polyps, submucosal fibroid, endometrial hyperplasia and endometrial cancer. - US Transvaginal Non-OB; Future  Other orders - Norethindrone Acetate-Ethinyl Estrad-FE (LOESTRIN 24 FE) 1-20 MG-MCG(24) tablet; Take 1 tablet by mouth daily.  Daritza, it was a pleasure seeing you today!

## 2019-02-12 ENCOUNTER — Telehealth: Payer: Self-pay | Admitting: Psychiatry

## 2019-02-12 DIAGNOSIS — F3131 Bipolar disorder, current episode depressed, mild: Secondary | ICD-10-CM

## 2019-02-12 DIAGNOSIS — F900 Attention-deficit hyperactivity disorder, predominantly inattentive type: Secondary | ICD-10-CM

## 2019-02-12 DIAGNOSIS — F411 Generalized anxiety disorder: Secondary | ICD-10-CM

## 2019-02-12 MED ORDER — LISDEXAMFETAMINE DIMESYLATE 30 MG PO CAPS: 30.0000 mg | ORAL_CAPSULE | Freq: Every day | ORAL | 0 refills | Status: DC

## 2019-02-12 NOTE — Telephone Encounter (Signed)
Returned call to patient.  Discussed with her that we received EKG done on 02/12/2019 at her primary care office.  Per review of the EKG patient has sinus arrhythmia, QRS change in V2 may be due to LVH but cannot rule out septal infarct.  Abnormal ECG.  This was discussed with patient.  She reports she is aware about this and she will continue to follow-up with her primary care office.  She was advised to get another EKG by her PMD 6 weeks from now.  Discussed with patient that she may have to stop the Adderall if she is having any palpitations or other side effects.  Patient reports she does feel heart palpitations ever since starting the Adderall.  She is agreeable to stopping Adderall.  Patient wants to go back to Vyvanse.  Advised patient to continue to monitor herself and get immediate medical help and stop her Vyvanse if she notices any side effects.

## 2019-02-20 ENCOUNTER — Ambulatory Visit (INDEPENDENT_AMBULATORY_CARE_PROVIDER_SITE_OTHER): Admitting: Psychiatry

## 2019-02-20 ENCOUNTER — Other Ambulatory Visit: Payer: Self-pay

## 2019-02-20 ENCOUNTER — Encounter: Payer: Self-pay | Admitting: Psychiatry

## 2019-02-20 DIAGNOSIS — F3175 Bipolar disorder, in partial remission, most recent episode depressed: Secondary | ICD-10-CM

## 2019-02-20 DIAGNOSIS — F900 Attention-deficit hyperactivity disorder, predominantly inattentive type: Secondary | ICD-10-CM

## 2019-02-20 DIAGNOSIS — F411 Generalized anxiety disorder: Secondary | ICD-10-CM

## 2019-02-20 MED ORDER — LISDEXAMFETAMINE DIMESYLATE 10 MG PO CAPS: 10.0000 mg | ORAL_CAPSULE | Freq: Every day | ORAL | 0 refills | Status: DC

## 2019-02-20 NOTE — Progress Notes (Signed)
Provider Location : [facility] Patient Location : Home  Virtual Visit via Video Note  I connected with Sierra [name] on 02/20/19 at 11:40 AM EST by a video enabled telemedicine application and verified that I am speaking with the correct person using two identifiers.   I discussed the limitations of evaluation and management by telemedicine and the availability of in person appointments. The patient expressed understanding and agreed to proceed.    I discussed the assessment and treatment plan with the patient. The patient was provided an opportunity to ask questions and all were answered. The patient agreed with the plan and demonstrated an understanding of the instructions.   The patient was advised to call back or seek an in-person evaluation if the symptoms worsen or if the condition fails to improve as anticipated.  BH MD OP Progress Note  02/20/2019 11:52 AM Sierra [name]  MRN:  [id number]  Chief Complaint:  Chief Complaint    Follow-up     HPI: Sierra [name] is a 39 year old Caucasian female, married, lives in [place], has a history of bipolar disorder, GAD, binge eating disorder, ADHD was evaluated by telemedicine today.  Patient today reports she is currently better since stopping the Adderall.  The Adderall gave her side effects of insomnia, heart palpitation.  She reports she is currently back on the Vyvanse and she is tolerating it well.  She denies any significant side effects to the Vyvanse.  She reports her focus and concentration have improved however she is interested in dosage increase.  Patient denies any suicidality, homicidality or perceptual disturbances.  She continues to report her mood symptoms have improved.  She is compliant on her medications as prescribed.  She denies any other concerns today. Visit Diagnosis:    ICD-10-CM   1. Bipolar disorder, in partial remission, most recent episode depressed (HCC)  F31.75   2. GAD (generalized anxiety disorder)   F41.1   3. Attention deficit hyperactivity disorder (ADHD), predominantly inattentive type  F90.0 lisdexamfetamine (VYVANSE) 10 MG capsule    Past Psychiatric History: I have reviewed past psychiatric history from my progress note on 06/20/2017.  Past Medical History:  Past Medical History:  Diagnosis Date  . Abdominal pain, other specified site   . ADHD   . 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 Anemia   . Anxiety state, unspecified   . Bipolar 1 disorder, depressed (HCC) 05/21/2017  . BIPOLAR AFFECTIVE DISORDER 12/23/2006   Qualifier: Diagnosis of  By: Ermalene Searing MD, Amy    . Bipolar affective disorder (HCC) 10/12/2011  . Bipolar disorder, unspecified (HCC)   . Depressive disorder, not elsewhere classified   . Edema   . Esophageal reflux   . Fibromyalgia   . Headache   . History of kidney stones   . Mild or unspecified pre-eclampsia, unspecified as to episode of care   . Morbid obesity (HCC)   . Myalgia and myositis, unspecified   . Other dyspnea and respiratory abnormality   . Preeclampsia    with first pregnancy  . Raynaud's disease   . Urinary tract infection, site not specified     Past Surgical History:  Procedure Laterality Date  . CESAREAN SECTION     X 2  . CHOLECYSTECTOMY  03/2006  . CYSTOSCOPY W/ URETERAL STENT PLACEMENT Left 05/14/2016   Procedure: CYSTOSCOPY WITH STENT REPLACEMENT;  Surgeon: [name], MD;  Location: [facility];  Service: Urology;  Laterality: Left;  . CYSTOSCOPY WITH STENT PLACEMENT Left 04/25/2016   Procedure:  CYSTOSCOPY WITH STENT PLACEMENT;  Surgeon: [name], MD;  Location: [facility];  Service: Urology;  Laterality: Left;  . DILATION AND CURETTAGE OF UTERUS    . IVC FILTER INSERTION  03/26/2016   [facility]  . LAPAROSCOPIC GASTRIC RESTRICTIVE DUODENAL PROCEDURE (DUODENAL SWITCH)  03/2016  . LITHOTRIPSY    . TONSILLECTOMY AND ADENOIDECTOMY    . URETEROSCOPY WITH HOLMIUM LASER LITHOTRIPSY Left 05/14/2016   Procedure: URETEROSCOPY  WITH HOLMIUM LASER LITHOTRIPSY;  Surgeon: [name], MD;  Location: [facility];  Service: Urology;  Laterality: Left;    Family Psychiatric History: Reviewed family psychiatric history from my progress note on 06/20/2017.  Family History:  Family History  Problem Relation Age of Onset  . Depression Father   . Alcohol abuse Father   . Depression Mother   . Hyperlipidemia Mother   . Sleep apnea Mother   . Depression Sister   . Hyperlipidemia Brother   . Depression Brother   . Hyperlipidemia Brother   . Depression Brother   . Alcohol abuse Brother   . Coronary artery disease Maternal Grandmother   . Heart attack Maternal Grandmother   . Diabetes Maternal Grandmother   . Lung cancer Maternal Grandfather   . Emphysema Maternal Grandfather   . Coronary artery disease Maternal Grandfather   . Lupus Other        Aunt  . Fibromyalgia Other        Aunt    Social History: Reviewed social history from my progress note on 06/20/2017. Social History   Socioeconomic History  . Marital status: Married    Spouse name: brian  . Number of children: 2  . Years of education: Not on file  . Highest education level: Some college, no degree  Occupational History  . Occupation: Home maker  Tobacco Use  . Smoking status: Never Smoker  . Smokeless tobacco: Never Used  Substance and Sexual Activity  . Alcohol use: Yes    Alcohol/week: 2.0 - 3.0 standard drinks    Types: 2 Glasses of wine per week  . Drug use: No  . Sexual activity: Not Currently    Partners: Male    Birth control/protection: None, I.U.D.  Other Topics Concern  . Not on file  Social History Narrative   1 child, 2 step-sons      Regular exercise-no   Diet: no fast food, likes sweets   Social Determinants of Health   Financial Resource Strain:   . Difficulty of Paying Living Expenses: Not on file  Food Insecurity:   . Worried About Programme researcher, broadcasting/film/video in the Last Year: Not on file  . Ran Out of Food in the Last  Year: Not on file  Transportation Needs:   . Lack of Transportation (Medical): Not on file  . Lack of Transportation (Non-Medical): Not on file  Physical Activity:   . Days of Exercise per Week: Not on file  . Minutes of Exercise per Session: Not on file  Stress:   . Feeling of Stress : Not on file  Social Connections:   . Frequency of Communication with Friends and Family: Not on file  . Frequency of Social Gatherings with Friends and Family: Not on file  . Attends Religious Services: Not on file  . Active Member of Clubs or Organizations: Not on file  . Attends Banker Meetings: Not on file  . Marital Status: Not on file    Allergies:  Allergies  Allergen Reactions  . Montez Hageman  [place] (Cats Claw) Other (See Comments)    allgeric to cats in general causes sneezing  . Bee Pollen Itching  . Molds & Smuts Other (See Comments)  . Pollen Extract   . Milk-Related Compounds     Able to tolerate yogurt; mild intolerance to milk/cheese  . Pineapple Rash  . Soy Allergy Diarrhea  . Strawberry Extract Rash    Break out on tongue noted Break out on tongue noted Break out on tongue noted    Metabolic Disorder Labs: Lab Results  Component Value Date   HGBA1C 4.7 (L) 05/22/2017   MPG 88.19 05/22/2017   Lab Results  Component Value Date   PROLACTIN 7.6 11/07/2018   Lab Results  Component Value Date   CHOL 105 05/22/2017   TRIG 41 05/22/2017   HDL 48 05/22/2017   CHOLHDL 2.2 05/22/2017   VLDL 8 05/22/2017   LDLCALC 49 05/22/2017   LDLCALC 99 09/11/2013   Lab Results  Component Value Date   TSH 0.504 05/21/2018   TSH 0.677 05/22/2017    Therapeutic Level Labs: Lab Results  Component Value Date   LITHIUM 0.1 (L) 11/07/2018   LITHIUM 0.3 (L) 11/26/2017   No results found for: VALPROATE No components found for:  CBMZ  Current Medications: Current Outpatient Medications  Medication Sig Dispense Refill  . ARIPiprazole (ABILIFY) 30 MG tablet Take 1  tablet (30 mg total) by mouth daily. 90 tablet 0  . Biotin 5000 MCG TABS Take 1 tablet by mouth daily.    Marland Kitchen buPROPion (WELLBUTRIN XL) 150 MG 24 hr tablet Take 1 tablet (150 mg total) by mouth daily. 90 tablet 0  . calcium citrate-vitamin D 500-400 MG-UNIT chewable tablet Chew 1 tablet by mouth daily.     Marland Kitchen docusate sodium (COLACE) 100 MG capsule Take 300 mg by mouth daily.    . eszopiclone (LUNESTA) 2 MG TABS tablet Take 1 tablet (2 mg total) by mouth at bedtime. Take immediately before bedtime 30 tablet 1  . ferrous sulfate 325 (65 FE) MG tablet Take 325 mg by mouth daily with breakfast.    . hydrOXYzine (ATARAX/VISTARIL) 25 MG tablet Take 1 tablet (25 mg total) by mouth daily as needed for anxiety. 90 tablet 1  . lamoTRIgine (LAMICTAL) 100 MG tablet Take 1 tablet (100 mg total) by mouth daily. 90 tablet 0  . lamoTRIgine (LAMICTAL) 25 MG tablet Take 2 tablets (50 mg total) by mouth daily. To be added to 100 mg daily 180 tablet 0  . levonorgestrel (MIRENA) 20 MCG/24HR IUD 1 each by Intrauterine route once.    . lisdexamfetamine (VYVANSE) 10 MG capsule Take 1 capsule (10 mg total) by mouth daily. To be combined with 30 mg to make it 40 mg 30 capsule 0  . lisdexamfetamine (VYVANSE) 30 MG capsule Take 1 capsule (30 mg total) by mouth daily. 30 capsule 0  . lithium carbonate (LITHOBID) 300 MG CR tablet TAKE 1 TABLET BY MOUTH EVERY DAY WITH SUPPER 90 tablet 0  . montelukast (SINGULAIR) 10 MG tablet Take 10 mg by mouth daily.    . Multiple Vitamin (MULTIVITAMIN) tablet Take 1 tablet by mouth daily.    Marland Kitchen NIFEdipine (PROCARDIA-XL/ADALAT CC) 60 MG 24 hr tablet Take 60 mg by mouth daily.     . norethindrone (MICRONOR) 0.35 MG tablet Take 1 tablet (0.35 mg total) by mouth daily. 3 Package 0  . Norethindrone Acetate-Ethinyl Estrad-FE (LOESTRIN 24 FE) 1-20 MG-MCG(24) tablet Take 1 tablet by mouth daily. 3 Package  4  . omeprazole (PRILOSEC) 20 MG capsule Take 20 mg by mouth daily.     No current  facility-administered medications for this visit.     Musculoskeletal: Strength & Muscle Tone: UTA Gait & Station: normal Patient leans: N/A  Psychiatric Specialty Exam: Review of Systems  Psychiatric/Behavioral: Positive for decreased concentration (improving). Negative for agitation, behavioral problems, confusion, dysphoric mood, hallucinations, self-injury, sleep disturbance and suicidal ideas. The patient is not nervous/anxious and is not hyperactive.   All other systems reviewed and are negative.   There were no vitals taken for this visit.There is no height or weight on file to calculate BMI.  General Appearance: Casual  Eye Contact:  Fair  Speech:  Clear and Coherent  Volume:  Normal  Mood:  Euthymic  Affect:  Congruent  Thought Process:  Goal Directed and Descriptions of Associations: Intact  Orientation:  Full (Time, Place, and Person)  Thought Content: Logical   Suicidal Thoughts:  No  Homicidal Thoughts:  No  Memory:  Immediate;   Fair Recent;   Fair Remote;   Fair  Judgement:  Fair  Insight:  Fair  Psychomotor Activity:  Normal  Concentration:  Concentration: Fair and Attention Span: Fair  Recall:  [employer] of Knowledge: Fair  Language: Fair  Akathisia:  No  Handed:  Right  AIMS (if indicated):Denies tremors, rigidity  Assets:  Communication Skills Desire for [place] Talents/Skills Transportation Vocational/Educational  ADL's:  Intact  Cognition: WNL  Sleep:  Fair   Screenings: AIMS     Office Visit from 12/11/2017 in [facility] Office Visit from 11/29/2017 in [facility] Office Visit from 11/20/2017 in [facility] Admission (Discharged) from 05/21/2017 in [facility] Total Score  1  7  5   0    AUDIT     Admission (Discharged) from 05/21/2017 in [facility]  Alcohol Use  Disorder Identification Test Final Score (AUDIT)  6       Assessment and Plan: Sierra [name] is a 39 year old Caucasian female who has a history of bipolar disorder, anxiety disorder, binge eating disorder was evaluated by telemedicine today.  Patient is currently making progress with regards to her concentration and will benefit from medication dosage readjustment.  Plan as noted below.  Plan Bipolar disorder in partial remission Lamotrigine 150 mg p.o. daily Lithium 300 mg p.o. nightly-reduced dosage Lithium level dated 11/07/2018-0.1-subtherapeutic Abilify 30 mg p.o. daily Wellbutrin XL 150 mg p.o. daily Advised patient to continue CBT.  Anxiety disorder-stable Hydroxyzine as needed Continue CBT  ADHD inattentive type-some progress Increase Vyvanse to 40 mg p.o. daily. I have reviewed [place] controlled substance database. Discussed with patient to monitor her heart rate as well as other side effects like changes in her mood, sleep, appetite suppression.  Patient to return to her primary care provider for EKG follow-up.  Insomnia-stable Lunesta 2 mg p.o. nightly  Follow-up in clinic in 4 weeks or sooner if needed.  March 5 at 11:20 AM  I have spent atleast 20 minutes non face to face with patient today. More than 50 % of the time was spent for ordering medications and test ,psychoeducation and supportive psychotherapy and care coordination,as well as documenting clinical information in electronic health record. This note was generated in part or whole with voice recognition software. Voice recognition is usually quite accurate but there are transcription errors that can and very often do occur. I apologize for  any typographical errors that were not detected and corrected.        Jomarie Longs, MD 02/20/2019, 11:52 AM

## 2019-02-23 ENCOUNTER — Telehealth (HOSPITAL_COMMUNITY): Payer: Self-pay

## 2019-02-23 DIAGNOSIS — F900 Attention-deficit hyperactivity disorder, predominantly inattentive type: Secondary | ICD-10-CM

## 2019-02-23 MED ORDER — LISDEXAMFETAMINE DIMESYLATE 40 MG PO CAPS: 40.0000 mg | ORAL_CAPSULE | ORAL | 0 refills | Status: DC

## 2019-02-23 NOTE — Telephone Encounter (Signed)
Patient's pharmacy called regarding her Vyvanse 10mg  & 30mg  sent in on 2/5. They requested a total of 40mg  be sent instead so that the patient only pays $85 once instead of twice (she would have to pay $85 for the 10mg  and $85 for the 30mg ) sent to [facility] on [address] in [place]. Please review and advise. Thank you.

## 2019-02-23 NOTE — Telephone Encounter (Signed)
Sent Vyvanse 40 mg to pharmacy per request.

## 2019-02-24 ENCOUNTER — Ambulatory Visit: Admitting: Psychiatry

## 2019-02-25 NOTE — Telephone Encounter (Signed)
Notified patient - LVM 

## 2019-02-26 ENCOUNTER — Ambulatory Visit: Admitting: Obstetrics & Gynecology

## 2019-02-26 ENCOUNTER — Other Ambulatory Visit

## 2019-03-10 ENCOUNTER — Other Ambulatory Visit: Payer: Self-pay

## 2019-03-11 ENCOUNTER — Ambulatory Visit (INDEPENDENT_AMBULATORY_CARE_PROVIDER_SITE_OTHER): Admitting: Obstetrics & Gynecology

## 2019-03-11 ENCOUNTER — Ambulatory Visit (INDEPENDENT_AMBULATORY_CARE_PROVIDER_SITE_OTHER)

## 2019-03-11 DIAGNOSIS — Z975 Presence of (intrauterine) contraceptive device: Secondary | ICD-10-CM | POA: Diagnosis not present

## 2019-03-11 DIAGNOSIS — N921 Excessive and frequent menstruation with irregular cycle: Secondary | ICD-10-CM

## 2019-03-11 NOTE — Progress Notes (Signed)
    Sierra [name] 21-Nov-1980 [id number]        39 y.o.  [id number]   RP: BTB on Mirena IUD for Pelvic US  HPI: Improved with the addition of a low dose BCP.  No pelvic pain. Normal vaginal secretions. Urine/BMs normal.  No fever.   OB History  Gravida Para Term Preterm AB Living  5 2     2 2   SAB TAB Ectopic Multiple Live Births  2            # Outcome Date GA Lbr Len/2nd Weight Sex Delivery Anes PTL Lv  5 Gravida           4 SAB           3 SAB           2 Para           1 Para             Past medical history,surgical history, problem list, medications, allergies, family history and social history were all reviewed and documented in the EPIC chart.   Directed ROS with pertinent positives and negatives documented in the history of present illness/assessment and plan.  Exam:  There were no vitals filed for this visit. General appearance:  Normal  Pelvic today: T/V images.  Anteverted uterus normal in size and shape with no myometrial mass.  The uterus is measured at 8.68 x 5.12 x 4.77 cm.  The endometrial lining is thin and symmetrical with the endometrium seen adjacent to the IUD.  The overall endometrial lining is measured at 2.94 mm. The IUD is seen in normal position within the cavity. Both ovaries are normal in size with normal follicular pattern.  No adnexal mass.  No free fluid in the posterior cul-de-sac.   Assessment/Plan:  39 y.o. 31   1. Breakthrough bleeding associated with intrauterine device (IUD) Improved with the addition of the generic of Loestrin 24 FE 1/20.  Will complete the 3 packs prescribed and then observe.  Patient reassured that the pelvic ultrasound was normal with a endometrial lining measured at 2.94 mm.  The IUD was in normal intra uterine location.  The uterus and both ovaries were normal.  2/20 MD, 11:00 AM 03/11/2019

## 2019-03-14 ENCOUNTER — Encounter: Payer: Self-pay | Admitting: Obstetrics & Gynecology

## 2019-03-14 NOTE — Patient Instructions (Signed)
1. Breakthrough bleeding associated with intrauterine device (IUD) Improved with the addition of the generic of Loestrin 24 FE 1/20.  Will complete the 3 packs prescribed and then observe.  Patient reassured that the pelvic ultrasound was normal with a endometrial lining measured at 2.94 mm.  The IUD was in normal intra uterine location.  The uterus and both ovaries were normal.  Sierra [name], it was a pleasure seeing you today!

## 2019-03-20 ENCOUNTER — Ambulatory Visit (INDEPENDENT_AMBULATORY_CARE_PROVIDER_SITE_OTHER): Admitting: Psychiatry

## 2019-03-20 ENCOUNTER — Encounter: Payer: Self-pay | Admitting: Psychiatry

## 2019-03-20 ENCOUNTER — Other Ambulatory Visit: Payer: Self-pay

## 2019-03-20 DIAGNOSIS — F900 Attention-deficit hyperactivity disorder, predominantly inattentive type: Secondary | ICD-10-CM

## 2019-03-20 DIAGNOSIS — F3176 Bipolar disorder, in full remission, most recent episode depressed: Secondary | ICD-10-CM | POA: Insufficient documentation

## 2019-03-20 DIAGNOSIS — F411 Generalized anxiety disorder: Secondary | ICD-10-CM

## 2019-03-20 MED ORDER — LISDEXAMFETAMINE DIMESYLATE 40 MG PO CAPS: 40.0000 mg | ORAL_CAPSULE | ORAL | 0 refills | Status: DC

## 2019-03-20 NOTE — Progress Notes (Signed)
Provider Location : [facility] Patient Location : Home   Virtual Visit via Video Note  I connected with Sierra [name] on 03/20/19 at 11:20 AM EST by a video enabled telemedicine application and verified that I am speaking with the correct person using two identifiers.   I discussed the limitations of evaluation and management by telemedicine and the availability of in person appointments. The patient expressed understanding and agreed to proceed.     I discussed the assessment and treatment plan with the patient. The patient was provided an opportunity to ask questions and all were answered. The patient agreed with the plan and demonstrated an understanding of the instructions.   The patient was advised to call back or seek an in-person evaluation if the symptoms worsen or if the condition fails to improve as anticipated.   [facility] MD OP Progress Note  03/20/2019 12:45 PM Sierra [name]  MRN:  [id number]  Chief Complaint:  Chief Complaint    Follow-up     HPI: Sierra [name] is a 39 year old Caucasian female, married, lives in [place], has a history of bipolar disorder, GAD, binge eating disorder, ADHD was evaluated by telemedicine today.  Patient today reports she is currently on her spring break.  She reports she believes her mood symptoms are stable.  She does not feel anxious or depressed.  Patient reports sleep continues to be good.  She reports she continues to take Vyvanse almost every day even though she is not at school for this week.  She reports when she does not take it she has problems with concentration.  Patient denies any suicidality, homicidality or perceptual disturbances.  She denies any side effects to her medications at this time.  She reports she has an appointment scheduled with her primary care provider to repeat her EKG since her previous EKG few weeks ago was abnormal.  Patient denies any other concerns today. Visit Diagnosis:    ICD-10-CM   1. Bipolar  disorder, in full remission, most recent episode depressed ([place])  F31.76   2. GAD (generalized anxiety disorder)  F41.1   3. Attention deficit hyperactivity disorder (ADHD), predominantly inattentive type  F90.0 lisdexamfetamine (VYVANSE) 40 MG capsule    lisdexamfetamine (VYVANSE) 40 MG capsule    Past Psychiatric History: I have reviewed past psychiatric history from my progress note on 06/20/2017  Past Medical History:  Past Medical History:  Diagnosis Date  . Abdominal pain, other specified site   . ADHD   . Affective bipolar disorder ([place]) 10/12/2011  . Anemia   . Anxiety state, unspecified   . Bipolar 1 disorder, depressed ([place]) 05/21/2017  . BIPOLAR AFFECTIVE DISORDER 12/23/2006   Qualifier: Diagnosis of  By: Diona Browner MD, Amy    . Bipolar affective disorder ([place]) 10/12/2011  . Bipolar disorder, unspecified ([place])   . Depressive disorder, not elsewhere classified   . Edema   . Esophageal reflux   . Fibromyalgia   . Headache   . History of kidney stones   . Mild or unspecified pre-eclampsia, unspecified as to episode of care   . Morbid obesity ([place])   . Myalgia and myositis, unspecified   . Other dyspnea and respiratory abnormality   . Preeclampsia    with first pregnancy  . Raynaud's disease   . Urinary tract infection, site not specified     Past Surgical History:  Procedure Laterality Date  . CESAREAN SECTION     X 2  . CHOLECYSTECTOMY  03/2006  . CYSTOSCOPY W/ URETERAL  STENT PLACEMENT Left 05/14/2016   Procedure: CYSTOSCOPY WITH STENT REPLACEMENT;  Surgeon: [name], MD;  Location: [facility];  Service: Urology;  Laterality: Left;  . CYSTOSCOPY WITH STENT PLACEMENT Left 04/25/2016   Procedure: CYSTOSCOPY WITH STENT PLACEMENT;  Surgeon: [name], MD;  Location: [facility];  Service: Urology;  Laterality: Left;  . DILATION AND CURETTAGE OF UTERUS    . IVC FILTER INSERTION  03/26/2016   [facility]  . LAPAROSCOPIC GASTRIC RESTRICTIVE DUODENAL PROCEDURE (DUODENAL  SWITCH)  03/2016  . LITHOTRIPSY    . TONSILLECTOMY AND ADENOIDECTOMY    . URETEROSCOPY WITH HOLMIUM LASER LITHOTRIPSY Left 05/14/2016   Procedure: URETEROSCOPY WITH HOLMIUM LASER LITHOTRIPSY;  Surgeon: [name], MD;  Location: [facility];  Service: Urology;  Laterality: Left;    Family Psychiatric History: I have reviewed family psychiatric history from my progress note on 06/20/2017  Family History:  Family History  Problem Relation Age of Onset  . Depression Father   . Alcohol abuse Father   . Depression Mother   . Hyperlipidemia Mother   . Sleep apnea Mother   . Depression Sister   . Hyperlipidemia Brother   . Depression Brother   . Hyperlipidemia Brother   . Depression Brother   . Alcohol abuse Brother   . Coronary artery disease Maternal Grandmother   . Heart attack Maternal Grandmother   . Diabetes Maternal Grandmother   . Lung cancer Maternal Grandfather   . Emphysema Maternal Grandfather   . Coronary artery disease Maternal Grandfather   . Lupus Other        Aunt  . Fibromyalgia Other        Aunt    Social History: I have reviewed social history from my progress note on 06/20/2017 Social History   Socioeconomic History  . Marital status: Married    Spouse name: brian  . Number of children: 2  . Years of education: Not on file  . Highest education level: Some college, no degree  Occupational History  . Occupation: Home maker  Tobacco Use  . Smoking status: Never Smoker  . Smokeless tobacco: Never Used  Substance and Sexual Activity  . Alcohol use: Yes    Alcohol/week: 2.0 - 3.0 standard drinks    Types: 2 Glasses of wine per week  . Drug use: No  . Sexual activity: Not Currently    Partners: Male    Birth control/protection: None, I.U.D.  Other Topics Concern  . Not on file  Social History Narrative   1 child, 2 step-sons      Regular exercise-no   Diet: no fast food, likes sweets   Social Determinants of Health   Financial Resource Strain:    . Difficulty of Paying Living Expenses: Not on file  Food Insecurity:   . Worried About Programme researcher, broadcasting/film/video in the Last Year: Not on file  . Ran Out of Food in the Last Year: Not on file  Transportation Needs:   . Lack of Transportation (Medical): Not on file  . Lack of Transportation (Non-Medical): Not on file  Physical Activity:   . Days of Exercise per Week: Not on file  . Minutes of Exercise per Session: Not on file  Stress:   . Feeling of Stress : Not on file  Social Connections:   . Frequency of Communication with Friends and Family: Not on file  . Frequency of Social Gatherings with Friends and Family: Not on file  . Attends Religious Services: Not on  file  . Active Member of Clubs or Organizations: Not on file  . Attends Banker Meetings: Not on file  . Marital Status: Not on file    Allergies:  Allergies  Allergen Reactions  . Uncaria Tomentosa (Cats Claw) Other (See Comments)    allgeric to cats in general causes sneezing  . Bee Pollen Itching  . Molds & Smuts Other (See Comments)  . Pollen Extract   . Milk-Related Compounds     Able to tolerate yogurt; mild intolerance to milk/cheese  . Pineapple Rash  . Soy Allergy Diarrhea  . Strawberry Extract Rash    Break out on tongue noted Break out on tongue noted Break out on tongue noted    Metabolic Disorder Labs: Lab Results  Component Value Date   HGBA1C 4.7 (L) 05/22/2017   MPG 88.19 05/22/2017   Lab Results  Component Value Date   PROLACTIN 7.6 11/07/2018   Lab Results  Component Value Date   CHOL 105 05/22/2017   TRIG 41 05/22/2017   HDL 48 05/22/2017   CHOLHDL 2.2 05/22/2017   VLDL 8 05/22/2017   LDLCALC 49 05/22/2017   LDLCALC 99 09/11/2013   Lab Results  Component Value Date   TSH 0.504 05/21/2018   TSH 0.677 05/22/2017    Therapeutic Level Labs: Lab Results  Component Value Date   LITHIUM 0.1 (L) 11/07/2018   LITHIUM 0.3 (L) 11/26/2017   No results found for:  VALPROATE No components found for:  CBMZ  Current Medications: Current Outpatient Medications  Medication Sig Dispense Refill  . ARIPiprazole (ABILIFY) 30 MG tablet Take 1 tablet (30 mg total) by mouth daily. 90 tablet 0  . Biotin 5000 MCG TABS Take 1 tablet by mouth daily.    Marland Kitchen buPROPion (WELLBUTRIN XL) 150 MG 24 hr tablet Take 1 tablet (150 mg total) by mouth daily. 90 tablet 0  . calcium citrate-vitamin D 500-400 MG-UNIT chewable tablet Chew 1 tablet by mouth daily.     Marland Kitchen docusate sodium (COLACE) 100 MG capsule Take 300 mg by mouth daily.    . eszopiclone (LUNESTA) 2 MG TABS tablet Take 1 tablet (2 mg total) by mouth at bedtime. Take immediately before bedtime 30 tablet 1  . ferrous sulfate 325 (65 FE) MG tablet Take 325 mg by mouth daily with breakfast.    . hydrOXYzine (ATARAX/VISTARIL) 25 MG tablet Take 1 tablet (25 mg total) by mouth daily as needed for anxiety. 90 tablet 1  . lamoTRIgine (LAMICTAL) 100 MG tablet Take 1 tablet (100 mg total) by mouth daily. 90 tablet 0  . lamoTRIgine (LAMICTAL) 25 MG tablet Take 2 tablets (50 mg total) by mouth daily. To be added to 100 mg daily 180 tablet 0  . levonorgestrel (MIRENA) 20 MCG/24HR IUD 1 each by Intrauterine route once.    Melene Muller ON 03/24/2019] lisdexamfetamine (VYVANSE) 40 MG capsule Take 1 capsule (40 mg total) by mouth every morning. 30 capsule 0  . [START ON 04/22/2019] lisdexamfetamine (VYVANSE) 40 MG capsule Take 1 capsule (40 mg total) by mouth every morning. 30 capsule 0  . lithium carbonate (LITHOBID) 300 MG CR tablet TAKE 1 TABLET BY MOUTH EVERY DAY WITH SUPPER 90 tablet 0  . montelukast (SINGULAIR) 10 MG tablet Take 10 mg by mouth daily.    . Multiple Vitamin (MULTIVITAMIN) tablet Take 1 tablet by mouth daily.    Marland Kitchen NIFEdipine (PROCARDIA-XL/ADALAT CC) 60 MG 24 hr tablet Take 60 mg by mouth daily.     Marland Kitchen  Norethindrone Acetate-Ethinyl Estrad-FE (LOESTRIN 24 FE) 1-20 MG-MCG(24) tablet Take 1 tablet by mouth daily. 3 Package 4  .  omeprazole (PRILOSEC) 20 MG capsule Take 20 mg by mouth daily.     No current facility-administered medications for this visit.     Musculoskeletal: Strength & Muscle Tone: UTA Gait & Station: normal Patient leans: N/A  Psychiatric Specialty Exam: Review of Systems  Psychiatric/Behavioral: Negative for agitation, behavioral problems, confusion, decreased concentration, dysphoric mood, hallucinations, self-injury, sleep disturbance and suicidal ideas. The patient is not nervous/anxious and is not hyperactive.   All other systems reviewed and are negative.   There were no vitals taken for this visit.There is no height or weight on file to calculate BMI.  General Appearance: Casual  Eye Contact:  Fair  Speech:  Normal Rate  Volume:  Normal  Mood:  Euthymic  Affect:  Congruent  Thought Process:  Goal Directed and Descriptions of Associations: Intact  Orientation:  Full (Time, Place, and Person)  Thought Content: Logical   Suicidal Thoughts:  No  Homicidal Thoughts:  No  Memory:  Immediate;   Fair Recent;   Fair Remote;   Fair  Judgement:  Fair  Insight:  Fair  Psychomotor Activity:  Normal  Concentration:  Concentration: Fair and Attention Span: Fair  Recall:  [employer] of Knowledge: Fair  Language: Fair  Akathisia:  No  Handed:  Right  AIMS (if indicated): UTA  Assets:  Communication Skills Desire for Improvement Housing Social Support  ADL's:  Intact  Cognition: WNL  Sleep:  Fair   Screenings: AIMS     Office Visit from 12/11/2017 in [facility] Office Visit from 11/29/2017 in [facility] Office Visit from 11/20/2017 in [facility] Admission (Discharged) from 05/21/2017 in [facility]  AIMS Total Score  1  7  5   0    AUDIT     Admission (Discharged) from 05/21/2017 in [facility]  Alcohol Use Disorder Identification Test Final Score  (AUDIT)  6       Assessment and Plan: Joyanna is a 39 year old Caucasian female who has a history of bipolar disorder, anxiety disorder, binge eating disorder was evaluated by telemedicine today.  Patient is currently stable with regards to her concentration, mood symptoms.  Plan as noted below.  Plan Bipolar disorder in remission Lamictal 150 mg p.o. daily Lithium 300 mg p.o. nightly-reduced dosage Lithium level dated 11/07/2018-0.1-subtherapeutic Abilify 30 mg p.o. daily Wellbutrin XL 150 mg p.o. daily Continue CBT  Anxiety disorder-stable Hydroxyzine as needed Continue CBT  ADHD inattentive type-stable Vyvanse 40 mg p.o. daily I have reviewed [place] controlled substance database I have provided 2 prescriptions with date specified-last 1 to be filled on or after 04/22/2019. Patient reports she has upcoming appointment with her primary care provider for follow-up on her EKG.  Insomnia-stable Lunesta 2 mg p.o. nightly  Follow-up in clinic in 2 months or sooner if needed.  I have spent atleast 20 minutes non face to face with patient today. More than 50 % of the time was spent for preparing to see the patient ( e.g., review of test, records ), obtaining and to review and separately obtained history , ordering medications and test ,psychoeducation and supportive psychotherapy and care coordination,as well as documenting clinical information in electronic health record. This note was generated in part or whole with voice recognition software. Voice recognition is usually quite accurate but there are transcription errors that can  and very often do occur. I apologize for any typographical errors that were not detected and corrected.       Jomarie Longs, MD 03/20/2019, 12:45 PM

## 2019-05-01 ENCOUNTER — Other Ambulatory Visit: Payer: Self-pay | Admitting: *Deleted

## 2019-05-01 ENCOUNTER — Inpatient Hospital Stay: Attending: Oncology

## 2019-05-01 ENCOUNTER — Other Ambulatory Visit: Payer: Self-pay

## 2019-05-01 DIAGNOSIS — Z79899 Other long term (current) drug therapy: Secondary | ICD-10-CM | POA: Insufficient documentation

## 2019-05-01 DIAGNOSIS — Z793 Long term (current) use of hormonal contraceptives: Secondary | ICD-10-CM | POA: Diagnosis not present

## 2019-05-01 DIAGNOSIS — D508 Other iron deficiency anemias: Secondary | ICD-10-CM | POA: Diagnosis not present

## 2019-05-01 DIAGNOSIS — K219 Gastro-esophageal reflux disease without esophagitis: Secondary | ICD-10-CM | POA: Insufficient documentation

## 2019-05-01 DIAGNOSIS — F909 Attention-deficit hyperactivity disorder, unspecified type: Secondary | ICD-10-CM | POA: Insufficient documentation

## 2019-05-01 DIAGNOSIS — F319 Bipolar disorder, unspecified: Secondary | ICD-10-CM | POA: Diagnosis not present

## 2019-05-01 DIAGNOSIS — Z801 Family history of malignant neoplasm of trachea, bronchus and lung: Secondary | ICD-10-CM | POA: Diagnosis not present

## 2019-05-01 DIAGNOSIS — Z87442 Personal history of urinary calculi: Secondary | ICD-10-CM | POA: Insufficient documentation

## 2019-05-01 DIAGNOSIS — Z8249 Family history of ischemic heart disease and other diseases of the circulatory system: Secondary | ICD-10-CM | POA: Diagnosis not present

## 2019-05-01 DIAGNOSIS — N920 Excessive and frequent menstruation with regular cycle: Secondary | ICD-10-CM | POA: Insufficient documentation

## 2019-05-01 DIAGNOSIS — Z9884 Bariatric surgery status: Secondary | ICD-10-CM | POA: Insufficient documentation

## 2019-05-01 DIAGNOSIS — Z8349 Family history of other endocrine, nutritional and metabolic diseases: Secondary | ICD-10-CM | POA: Diagnosis not present

## 2019-05-01 DIAGNOSIS — Z833 Family history of diabetes mellitus: Secondary | ICD-10-CM | POA: Insufficient documentation

## 2019-05-01 DIAGNOSIS — D509 Iron deficiency anemia, unspecified: Secondary | ICD-10-CM

## 2019-05-01 LAB — IRON AND TIBC
Iron: 64 ug/dL (ref 28–170)
Saturation Ratios: 20 % (ref 10.4–31.8)
TIBC: 314 ug/dL (ref 250–450)
UIBC: 250 ug/dL

## 2019-05-01 LAB — CBC WITH DIFFERENTIAL/PLATELET
Abs Immature Granulocytes: 0.01 10*3/uL (ref 0.00–0.07)
Basophils Absolute: 0 10*3/uL (ref 0.0–0.1)
Basophils Relative: 1 %
Eosinophils Absolute: 0.1 10*3/uL (ref 0.0–0.5)
Eosinophils Relative: 2 %
HCT: 42.9 % (ref 36.0–46.0)
Hemoglobin: 14.4 g/dL (ref 12.0–15.0)
Immature Granulocytes: 0 %
Lymphocytes Relative: 26 %
Lymphs Abs: 1.4 10*3/uL (ref 0.7–4.0)
MCH: 30.6 pg (ref 26.0–34.0)
MCHC: 33.6 g/dL (ref 30.0–36.0)
MCV: 91.1 fL (ref 80.0–100.0)
Monocytes Absolute: 0.5 10*3/uL (ref 0.1–1.0)
Monocytes Relative: 10 %
Neutro Abs: 3.3 10*3/uL (ref 1.7–7.7)
Neutrophils Relative %: 61 %
Platelets: 303 10*3/uL (ref 150–400)
RBC: 4.71 MIL/uL (ref 3.87–5.11)
RDW: 12.4 % (ref 11.5–15.5)
WBC: 5.5 10*3/uL (ref 4.0–10.5)
nRBC: 0 % (ref 0.0–0.2)

## 2019-05-01 LAB — FERRITIN: Ferritin: 49 ng/mL (ref 11–307)

## 2019-05-04 ENCOUNTER — Other Ambulatory Visit: Payer: Self-pay

## 2019-05-04 ENCOUNTER — Inpatient Hospital Stay

## 2019-05-04 ENCOUNTER — Inpatient Hospital Stay (HOSPITAL_BASED_OUTPATIENT_CLINIC_OR_DEPARTMENT_OTHER): Admitting: Oncology

## 2019-05-04 ENCOUNTER — Encounter: Payer: Self-pay | Admitting: Oncology

## 2019-05-04 DIAGNOSIS — D509 Iron deficiency anemia, unspecified: Secondary | ICD-10-CM | POA: Diagnosis not present

## 2019-05-04 NOTE — Progress Notes (Signed)
Patient stated that she had been doing well with no complaints. 

## 2019-05-07 NOTE — Progress Notes (Signed)
I connected with Sierra [name] on 05/07/19 at 11:15 AM EDT by video enabled telemedicine visit and verified that I am speaking with the correct person using two identifiers.   I discussed the limitations, risks, security and privacy concerns of performing an evaluation and management service by telemedicine and the availability of in-person appointments. I also discussed with the patient that there may be a patient responsible charge related to this service. The patient expressed understanding and agreed to proceed.  Other persons participating in the visit and their role in the encounter:  none  Patient's location:  home Provider's location:  work  Risk analyst Complaint: Routine follow-up of iron deficiency anemia  History of present illness: Patient is a 39 year old premenopausal female with a history of gastric bypass duodenal switch surgery back in 2018. She has been referred to [place] for iron deficiency anemia. Patient also reports relatively heavy menstrual cycles. Although her cycles last for about 5 days the first 2 to 3 days are particularly heavy. She has seen GYN for this and has an IUD in place but menstrual cycles continue to be heavy. She currently reports feeling fatigued. Denies any consistent use of NSAIDs. Denies any history of colon cancer. She was given a prescription for iron tablets which could not be fulfilled by her insurance. She has been taking bariatric multivitamins at this time.  Results of blood work from 05/21/2018 were as follows: CBC showed white count of 6.3, H&H of 12.2/37.5 and a platelet count of 294. Ferritin levels were low at 8. Iron study showed an elevated TIBC of 459 and a low iron saturation of 8%. B12 and folate were normal. Celiac disease panel was unremarkable. TSH was normal. We did have a repeat to rounds of urinalysis given the patient was having some menstrual cycles in the morning analysis from 07/01/2018 did not reveal any hematuria. H. pylori  stool antigen was negative.    Interval history: Patient is currently doing well and denies any complaints at this time.  Her menstrual cycles have been lighter after she changed her IUD to Mirena.  Denies any blood loss in her stool or urine.  Review of Systems  Constitutional: Negative for chills, fever, malaise/fatigue and weight loss.  HENT: Negative for congestion, ear discharge and nosebleeds.   Eyes: Negative for blurred vision.  Respiratory: Negative for cough, hemoptysis, sputum production, shortness of breath and wheezing.   Cardiovascular: Negative for chest pain, palpitations, orthopnea and claudication.  Gastrointestinal: Negative for abdominal pain, blood in stool, constipation, diarrhea, heartburn, melena, nausea and vomiting.  Genitourinary: Negative for dysuria, flank pain, frequency, hematuria and urgency.  Musculoskeletal: Negative for back pain, joint pain and myalgias.  Skin: Negative for rash.  Neurological: Negative for dizziness, tingling, focal weakness, seizures, weakness and headaches.  Endo/Heme/Allergies: Does not bruise/bleed easily.  Psychiatric/Behavioral: Negative for depression and suicidal ideas. The patient does not have insomnia.     Allergies  Allergen Reactions  . Uncaria Tomentosa (Cats Claw) Other (See Comments)    allgeric to cats in general causes sneezing  . Bee Pollen Itching  . Molds & Smuts Other (See Comments)  . Pollen Extract   . Milk-Related Compounds     Able to tolerate yogurt; mild intolerance to milk/cheese  . Pineapple Rash  . Soy Allergy Diarrhea  . Strawberry Extract Rash    Break out on tongue noted Break out on tongue noted Break out on tongue noted    Past Medical History:  Diagnosis Date  . Abdominal  pain, other specified site   . ADHD   . 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 Anemia   . Anxiety state, unspecified   . Bipolar 1 disorder, depressed (HCC) 05/21/2017  . BIPOLAR AFFECTIVE DISORDER 12/23/2006    Qualifier: Diagnosis of  By: Ermalene Searing MD, Amy    . Bipolar affective disorder (HCC) 10/12/2011  . Bipolar disorder, unspecified (HCC)   . Depressive disorder, not elsewhere classified   . Edema   . Esophageal reflux   . Fibromyalgia   . Headache   . History of kidney stones   . Mild or unspecified pre-eclampsia, unspecified as to episode of care   . Morbid obesity (HCC)   . Myalgia and myositis, unspecified   . Other dyspnea and respiratory abnormality   . Preeclampsia    with first pregnancy  . Raynaud's disease   . Urinary tract infection, site not specified     Past Surgical History:  Procedure Laterality Date  . CESAREAN SECTION     X 2  . CHOLECYSTECTOMY  03/2006  . CYSTOSCOPY W/ URETERAL STENT PLACEMENT Left 05/14/2016   Procedure: CYSTOSCOPY WITH STENT REPLACEMENT;  Surgeon: [name], MD;  Location: [facility];  Service: Urology;  Laterality: Left;  . CYSTOSCOPY WITH STENT PLACEMENT Left 04/25/2016   Procedure: CYSTOSCOPY WITH STENT PLACEMENT;  Surgeon: [name], MD;  Location: [facility];  Service: Urology;  Laterality: Left;  . DILATION AND CURETTAGE OF UTERUS    . IVC FILTER INSERTION  03/26/2016   [facility]  . LAPAROSCOPIC GASTRIC RESTRICTIVE DUODENAL PROCEDURE (DUODENAL SWITCH)  03/2016  . LITHOTRIPSY    . TONSILLECTOMY AND ADENOIDECTOMY    . URETEROSCOPY WITH HOLMIUM LASER LITHOTRIPSY Left 05/14/2016   Procedure: URETEROSCOPY WITH HOLMIUM LASER LITHOTRIPSY;  Surgeon: [name], MD;  Location: [facility];  Service: Urology;  Laterality: Left;    Social History   Socioeconomic History  . Marital status: Married    Spouse name: brian  . Number of children: 2  . Years of education: Not on file  . Highest education level: Some college, no degree  Occupational History  . Occupation: Home maker  Tobacco Use  . Smoking status: Never Smoker  . Smokeless tobacco: Never Used  Substance and Sexual Activity  . Alcohol use: Yes    Alcohol/week: 2.0 - 3.0  standard drinks    Types: 2 Glasses of wine per week  . Drug use: No  . Sexual activity: Not Currently    Partners: Male    Birth control/protection: None, I.U.D.  Other Topics Concern  . Not on file  Social History Narrative   1 child, 2 step-sons      Regular exercise-no   Diet: no fast food, likes sweets   Social Determinants of Health   Financial Resource Strain:   . Difficulty of Paying Living Expenses:   Food Insecurity:   . Worried About Programme researcher, broadcasting/film/video in the Last Year:   . Barista in the Last Year:   Transportation Needs:   . Freight forwarder (Medical):   Marland Kitchen Lack of Transportation (Non-Medical):   Physical Activity:   . Days of Exercise per Week:   . Minutes of Exercise per Session:   Stress:   . Feeling of Stress :   Social Connections:   . Frequency of Communication with Friends and Family:   . Frequency of Social Gatherings with Friends and Family:   . Attends Religious Services:   . Active  Member of Clubs or Organizations:   . Attends Banker Meetings:   Marland Kitchen Marital Status:   Intimate Partner Violence:   . Fear of Current or Ex-Partner:   . Emotionally Abused:   Marland Kitchen Physically Abused:   . Sexually Abused:     Family History  Problem Relation Age of Onset  . Depression Father   . Alcohol abuse Father   . Depression Mother   . Hyperlipidemia Mother   . Sleep apnea Mother   . Depression Sister   . Hyperlipidemia Brother   . Depression Brother   . Hyperlipidemia Brother   . Depression Brother   . Alcohol abuse Brother   . Coronary artery disease Maternal Grandmother   . Heart attack Maternal Grandmother   . Diabetes Maternal Grandmother   . Lung cancer Maternal Grandfather   . Emphysema Maternal Grandfather   . Coronary artery disease Maternal Grandfather   . Lupus Other        Aunt  . Fibromyalgia Other        Aunt     Current Outpatient Medications:  .  ARIPiprazole (ABILIFY) 30 MG tablet, Take 1 tablet (30 mg  total) by mouth daily., Disp: 90 tablet, Rfl: 0 .  Biotin 5000 MCG TABS, Take 1 tablet by mouth daily., Disp: , Rfl:  .  buPROPion (WELLBUTRIN XL) 150 MG 24 hr tablet, Take 1 tablet (150 mg total) by mouth daily., Disp: 90 tablet, Rfl: 0 .  calcium citrate-vitamin D 500-400 MG-UNIT chewable tablet, Chew 1 tablet by mouth daily. , Disp: , Rfl:  .  docusate sodium (COLACE) 100 MG capsule, Take 300 mg by mouth daily., Disp: , Rfl:  .  eszopiclone (LUNESTA) 2 MG TABS tablet, Take 1 tablet (2 mg total) by mouth at bedtime. Take immediately before bedtime, Disp: 30 tablet, Rfl: 1 .  ferrous sulfate 325 (65 FE) MG tablet, Take 325 mg by mouth daily with breakfast., Disp: , Rfl:  .  hydrOXYzine (ATARAX/VISTARIL) 25 MG tablet, Take 1 tablet (25 mg total) by mouth daily as needed for anxiety., Disp: 90 tablet, Rfl: 1 .  lamoTRIgine (LAMICTAL) 100 MG tablet, Take 1 tablet (100 mg total) by mouth daily., Disp: 90 tablet, Rfl: 0 .  lamoTRIgine (LAMICTAL) 25 MG tablet, Take 2 tablets (50 mg total) by mouth daily. To be added to 100 mg daily, Disp: 180 tablet, Rfl: 0 .  levonorgestrel (MIRENA) 20 MCG/24HR IUD, 1 each by Intrauterine route once., Disp: , Rfl:  .  lisdexamfetamine (VYVANSE) 40 MG capsule, Take 1 capsule (40 mg total) by mouth every morning., Disp: 30 capsule, Rfl: 0 .  lisdexamfetamine (VYVANSE) 40 MG capsule, Take 1 capsule (40 mg total) by mouth every morning., Disp: 30 capsule, Rfl: 0 .  lithium carbonate (LITHOBID) 300 MG CR tablet, TAKE 1 TABLET BY MOUTH EVERY DAY WITH SUPPER, Disp: 90 tablet, Rfl: 0 .  montelukast (SINGULAIR) 10 MG tablet, Take 10 mg by mouth daily., Disp: , Rfl:  .  Multiple Vitamin (MULTIVITAMIN) tablet, Take 1 tablet by mouth daily., Disp: , Rfl:  .  NIFEdipine (PROCARDIA-XL/ADALAT CC) 60 MG 24 hr tablet, Take 60 mg by mouth daily. , Disp: , Rfl:  .  Norethindrone Acetate-Ethinyl Estrad-FE (LOESTRIN 24 FE) 1-20 MG-MCG(24) tablet, Take 1 tablet by mouth daily., Disp: 3  Package, Rfl: 4 .  omeprazole (PRILOSEC) 20 MG capsule, Take 20 mg by mouth daily., Disp: , Rfl:   No results found.  No images are attached to  the encounter.   CMP Latest Ref Rng & Units 11/07/2018  Glucose 65 - 99 mg/dL 92  BUN 6 - 20 mg/dL 10  Creatinine 2.29 - 7.98 mg/dL 9.21  Sodium 194 - 174 mmol/L 139  Potassium 3.5 - 5.2 mmol/L 4.1  Chloride 96 - 106 mmol/L 107(H)  CO2 20 - 29 mmol/L 21  Calcium 8.7 - 10.2 mg/dL 9.6  Total Protein 6.5 - 8.1 g/dL -  Total Bilirubin 0.3 - 1.2 mg/dL -  Alkaline Phos 38 - 081 U/L -  AST 15 - 41 U/L -  ALT 14 - 54 U/L -   CBC Latest Ref Rng & Units 05/01/2019  WBC 4.0 - 10.5 K/uL 5.5  Hemoglobin 12.0 - 15.0 g/dL 44.8  Hematocrit 18.5 - 46.0 % 42.9  Platelets 150 - 400 K/uL 303     Observation/objective: Appears in no acute distress of a video visit today.  Breathing is nonlabored  Assessment and plan: Patient is a 39 year old female with iron deficiency anemia possibly secondary to menorrhagia here for routine follow-up.  Patient's hemoglobin has remained stable between 12-14 over the last 2 years.  Today her hemoglobin is 14.4.  Iron studies are Within normal limits.  She does not require any IV iron at this time.  I will repeat her CBC ferritin and iron studies in 6 months in 1 year and I will see her back in 1 year given the stability of her counts  Follow-up instructions: As above  I discussed the assessment and treatment plan with the patient. The patient was provided an opportunity to ask questions and all were answered. The patient agreed with the plan and demonstrated an understanding of the instructions.   The patient was advised to call back or seek an in-person evaluation if the symptoms worsen or if the condition fails to improve as anticipated.   Visit Diagnosis: 1. Iron deficiency anemia, unspecified iron deficiency anemia type     Dr. Owens Shark, MD, MPH [facility] Tel-  [phone] 05/07/2019 10:07 AM

## 2019-05-14 ENCOUNTER — Telehealth: Admitting: Psychiatry

## 2019-05-18 ENCOUNTER — Telehealth: Payer: Self-pay

## 2019-05-18 DIAGNOSIS — F411 Generalized anxiety disorder: Secondary | ICD-10-CM

## 2019-05-18 MED ORDER — HYDROXYZINE HCL 25 MG PO TABS: 25.0000 mg | ORAL_TABLET | Freq: Every day | ORAL | 0 refills | Status: DC | PRN

## 2019-05-18 NOTE — Telephone Encounter (Signed)
Rx sent 

## 2019-05-18 NOTE — Telephone Encounter (Signed)
Pt called needs refills on hydroxyzine she is out.

## 2019-05-28 ENCOUNTER — Telehealth: Payer: Self-pay

## 2019-05-28 DIAGNOSIS — F900 Attention-deficit hyperactivity disorder, predominantly inattentive type: Secondary | ICD-10-CM

## 2019-05-28 MED ORDER — LISDEXAMFETAMINE DIMESYLATE 40 MG PO CAPS: 40.0000 mg | ORAL_CAPSULE | ORAL | 0 refills | Status: DC

## 2019-05-28 NOTE — Telephone Encounter (Signed)
pt called needs a refill on her vyvanse

## 2019-05-28 NOTE — Telephone Encounter (Signed)
I have sent Vyvanse to pharmacy. 

## 2019-06-03 ENCOUNTER — Encounter: Payer: Self-pay | Admitting: Psychiatry

## 2019-06-03 ENCOUNTER — Other Ambulatory Visit: Payer: Self-pay

## 2019-06-03 ENCOUNTER — Telehealth (INDEPENDENT_AMBULATORY_CARE_PROVIDER_SITE_OTHER): Admitting: Psychiatry

## 2019-06-03 DIAGNOSIS — G4700 Insomnia, unspecified: Secondary | ICD-10-CM | POA: Diagnosis not present

## 2019-06-03 DIAGNOSIS — F411 Generalized anxiety disorder: Secondary | ICD-10-CM | POA: Diagnosis not present

## 2019-06-03 DIAGNOSIS — F900 Attention-deficit hyperactivity disorder, predominantly inattentive type: Secondary | ICD-10-CM

## 2019-06-03 DIAGNOSIS — F3176 Bipolar disorder, in full remission, most recent episode depressed: Secondary | ICD-10-CM

## 2019-06-03 MED ORDER — ARIPIPRAZOLE 30 MG PO TABS: 30.0000 mg | ORAL_TABLET | Freq: Every day | ORAL | 0 refills | Status: DC

## 2019-06-03 MED ORDER — LAMOTRIGINE 100 MG PO TABS: 100.0000 mg | ORAL_TABLET | Freq: Every day | ORAL | 0 refills | Status: DC

## 2019-06-03 MED ORDER — BUPROPION HCL ER (XL) 150 MG PO TB24: 150.0000 mg | ORAL_TABLET | Freq: Every day | ORAL | 0 refills | Status: DC

## 2019-06-03 MED ORDER — LITHIUM CARBONATE ER 300 MG PO TBCR: EXTENDED_RELEASE_TABLET | ORAL | 0 refills | Status: DC

## 2019-06-03 MED ORDER — LISDEXAMFETAMINE DIMESYLATE 40 MG PO CAPS: 40.0000 mg | ORAL_CAPSULE | ORAL | 0 refills | Status: DC

## 2019-06-03 MED ORDER — ZALEPLON 5 MG PO CAPS: 5.0000 mg | ORAL_CAPSULE | Freq: Every evening | ORAL | 1 refills | Status: DC | PRN

## 2019-06-03 NOTE — Progress Notes (Signed)
Provider Location : [facility] Patient Location : Home  Virtual Visit via Video Note  I connected with Sierra [name] on 06/03/19 at 10:40 AM EDT by a video enabled telemedicine application and verified that I am speaking with the correct person using two identifiers.   I discussed the limitations of evaluation and management by telemedicine and the availability of in person appointments. The patient expressed understanding and agreed to proceed.     I discussed the assessment and treatment plan with the patient. The patient was provided an opportunity to ask questions and all were answered. The patient agreed with the plan and demonstrated an understanding of the instructions.   The patient was advised to call back or seek an in-person evaluation if the symptoms worsen or if the condition fails to improve as anticipated.   BH MD OP Progress Note  06/03/2019 5:40 PM Sierra [name]  MRN:  [id number]  Chief Complaint:  Chief Complaint    Follow-up     HPI: Sierra [name] is a 39 year old Caucasian female, married, lives in [place], has a history of bipolar disorder, GAD, binge eating disorder, ADHD was evaluated by telemedicine today.  Patient today reports she is currently doing well with regards to her mood.  She denies any significant depression or mood lability.  Patient reports however that sleep continues to be affected.  She reports she has not been using the Lunesta much.  However it used to help her occasionally when she needed help with sleep.  She however reports since the past few weeks it does not seem to be helpful.  She has one night every week when she cannot sleep at all.  She reports recently when she had such a night she took the [place] and she did not sleep.  Patient reports she would like to try something new for her sleep.  She has difficulty falling asleep.  Patient denies any suicidality, homicidality or perceptual disturbances.  Patient denies any  problems with her attention and focus and reports she is compliant on the Vyvanse which helps.  She denies any other concerns today.    Visit Diagnosis:    ICD-10-CM   1. Bipolar disorder, in full remission, most recent episode depressed (HCC)  F31.76 lithium carbonate (LITHOBID) 300 MG CR tablet    lamoTRIgine (LAMICTAL) 100 MG tablet    buPROPion (WELLBUTRIN XL) 150 MG 24 hr tablet    ARIPiprazole (ABILIFY) 30 MG tablet  2. GAD (generalized anxiety disorder)  F41.1 lithium carbonate (LITHOBID) 300 MG CR tablet    lamoTRIgine (LAMICTAL) 100 MG tablet    ARIPiprazole (ABILIFY) 30 MG tablet  3. Attention deficit hyperactivity disorder (ADHD), predominantly inattentive type  F90.0 lisdexamfetamine (VYVANSE) 40 MG capsule  4. Insomnia, unspecified type  G47.00 zaleplon (SONATA) 5 MG capsule    Past Psychiatric History: I have reviewed past psychiatric history from my progress note on 06/20/2017  Past Medical History:  Past Medical History:  Diagnosis Date  . Abdominal pain, other specified site   . ADHD   . 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 Anemia   . Anxiety state, unspecified   . Bipolar 1 disorder, depressed (HCC) 05/21/2017  . BIPOLAR AFFECTIVE DISORDER 12/23/2006   Qualifier: Diagnosis of  By: Ermalene Searing MD, Amy    . Bipolar affective disorder (HCC) 10/12/2011  . Bipolar disorder, unspecified (HCC)   . Depressive disorder, not elsewhere classified   . Edema   . Esophageal reflux   . Fibromyalgia   .  Headache   . History of kidney stones   . Mild or unspecified pre-eclampsia, unspecified as to episode of care   . Morbid obesity (HCC)   . Myalgia and myositis, unspecified   . Other dyspnea and respiratory abnormality   . Preeclampsia    with first pregnancy  . Raynaud's disease   . Urinary tract infection, site not specified     Past Surgical History:  Procedure Laterality Date  . CESAREAN SECTION     X 2  . CHOLECYSTECTOMY  03/2006  . CYSTOSCOPY W/ URETERAL  STENT PLACEMENT Left 05/14/2016   Procedure: CYSTOSCOPY WITH STENT REPLACEMENT;  Surgeon: Vanna ScotlandAshley Brandon, MD;  Location: [facility];  Service: Urology;  Laterality: Left;  . CYSTOSCOPY WITH STENT PLACEMENT Left 04/25/2016   Procedure: CYSTOSCOPY WITH STENT PLACEMENT;  Surgeon: Vanna ScotlandAshley Brandon, MD;  Location: [facility];  Service: Urology;  Laterality: Left;  . DILATION AND CURETTAGE OF UTERUS    . IVC FILTER INSERTION  03/26/2016   [facility]  . LAPAROSCOPIC GASTRIC RESTRICTIVE DUODENAL PROCEDURE (DUODENAL SWITCH)  03/2016  . LITHOTRIPSY    . TONSILLECTOMY AND ADENOIDECTOMY    . URETEROSCOPY WITH HOLMIUM LASER LITHOTRIPSY Left 05/14/2016   Procedure: URETEROSCOPY WITH HOLMIUM LASER LITHOTRIPSY;  Surgeon: Vanna ScotlandAshley Brandon, MD;  Location: [facility];  Service: Urology;  Laterality: Left;    Family Psychiatric History: Reviewed family psychiatric history from my progress note on 06/20/2017  Family History:  Family History  Problem Relation Age of Onset  . Depression Father   . Alcohol abuse Father   . Depression Mother   . Hyperlipidemia Mother   . Sleep apnea Mother   . Depression Sister   . Hyperlipidemia Brother   . Depression Brother   . Hyperlipidemia Brother   . Depression Brother   . Alcohol abuse Brother   . Coronary artery disease Maternal Grandmother   . Heart attack Maternal Grandmother   . Diabetes Maternal Grandmother   . Lung cancer Maternal Grandfather   . Emphysema Maternal Grandfather   . Coronary artery disease Maternal Grandfather   . Lupus Other        Aunt  . Fibromyalgia Other        Aunt    Social History: Reviewed social history from my progress note on 06/20/2017 Social History   Socioeconomic History  . Marital status: Married    Spouse name: brian  . Number of children: 2  . Years of education: Not on file  . Highest education level: Some college, no degree  Occupational History  . Occupation: Home maker  Tobacco Use  . Smoking status: Never Smoker  .  Smokeless tobacco: Never Used  Substance and Sexual Activity  . Alcohol use: Yes    Alcohol/week: 2.0 - 3.0 standard drinks    Types: 2 Glasses of wine per week  . Drug use: No  . Sexual activity: Not Currently    Partners: Male    Birth control/protection: None, I.U.D.  Other Topics Concern  . Not on file  Social History Narrative   1 child, 2 step-sons      Regular exercise-no   Diet: no fast food, likes sweets   Social Determinants of Health   Financial Resource Strain:   . Difficulty of Paying Living Expenses:   Food Insecurity:   . Worried About Programme researcher, broadcasting/film/videounning Out of Food in the Last Year:   . [employer] Out of Food in the Last Year:   Transportation Needs:   . Lack of  Transportation (Medical):   Marland Kitchen Lack of Transportation (Non-Medical):   Physical Activity:   . Days of Exercise per Week:   . Minutes of Exercise per Session:   Stress:   . Feeling of Stress :   Social Connections:   . Frequency of Communication with Friends and Family:   . Frequency of Social Gatherings with Friends and Family:   . Attends Religious Services:   . Active Member of Clubs or Organizations:   . Attends Banker Meetings:   Marland Kitchen Marital Status:     Allergies:  Allergies  Allergen Reactions  . Uncaria Tomentosa (Cats Claw) Other (See Comments)    allgeric to cats in general causes sneezing  . Bee Pollen Itching  . Molds & Smuts Other (See Comments)  . Pollen Extract   . Milk-Related Compounds     Able to tolerate yogurt; mild intolerance to milk/cheese  . Pineapple Rash  . Soy Allergy Diarrhea  . Strawberry Extract Rash    Break out on tongue noted Break out on tongue noted Break out on tongue noted    Metabolic Disorder Labs: Lab Results  Component Value Date   HGBA1C 4.7 (L) 05/22/2017   MPG 88.19 05/22/2017   Lab Results  Component Value Date   PROLACTIN 7.6 11/07/2018   Lab Results  Component Value Date   CHOL 105 05/22/2017   TRIG 41 05/22/2017   HDL 48 05/22/2017    CHOLHDL 2.2 05/22/2017   VLDL 8 05/22/2017   LDLCALC 49 05/22/2017   LDLCALC 99 09/11/2013   Lab Results  Component Value Date   TSH 0.504 05/21/2018   TSH 0.677 05/22/2017    Therapeutic Level Labs: Lab Results  Component Value Date   LITHIUM 0.1 (L) 11/07/2018   LITHIUM 0.3 (L) 11/26/2017   No results found for: VALPROATE No components found for:  CBMZ  Current Medications: Current Outpatient Medications  Medication Sig Dispense Refill  . ARIPiprazole (ABILIFY) 30 MG tablet Take 1 tablet (30 mg total) by mouth daily. 90 tablet 0  . Biotin 5000 MCG TABS Take 1 tablet by mouth daily.    Marland Kitchen buPROPion (WELLBUTRIN XL) 150 MG 24 hr tablet Take 1 tablet (150 mg total) by mouth daily. 90 tablet 0  . calcium citrate-vitamin D 500-400 MG-UNIT chewable tablet Chew 1 tablet by mouth daily.     Marland Kitchen docusate sodium (COLACE) 100 MG capsule Take 300 mg by mouth daily.    . ferrous sulfate 325 (65 FE) MG tablet Take 325 mg by mouth daily with breakfast.    . hydrOXYzine (ATARAX/VISTARIL) 25 MG tablet Take 1 tablet (25 mg total) by mouth daily as needed for anxiety. 30 tablet 0  . lamoTRIgine (LAMICTAL) 100 MG tablet Take 1 tablet (100 mg total) by mouth daily. 90 tablet 0  . lamoTRIgine (LAMICTAL) 25 MG tablet Take 2 tablets (50 mg total) by mouth daily. To be added to 100 mg daily 180 tablet 0  . levonorgestrel (MIRENA) 20 MCG/24HR IUD 1 each by Intrauterine route once.    . lisdexamfetamine (VYVANSE) 40 MG capsule Take 1 capsule (40 mg total) by mouth every morning. 30 capsule 0  . [START ON 06/25/2019] lisdexamfetamine (VYVANSE) 40 MG capsule Take 1 capsule (40 mg total) by mouth every morning. 30 capsule 0  . lithium carbonate (LITHOBID) 300 MG CR tablet TAKE 1 TABLET BY MOUTH EVERY DAY WITH SUPPER 90 tablet 0  . montelukast (SINGULAIR) 10 MG tablet Take 10 mg by mouth  daily.    . Multiple Vitamin (MULTIVITAMIN) tablet Take 1 tablet by mouth daily.    Marland Kitchen NIFEdipine (PROCARDIA-XL/ADALAT CC)  60 MG 24 hr tablet Take 60 mg by mouth daily.     . Norethindrone Acetate-Ethinyl Estrad-FE (LOESTRIN 24 FE) 1-20 MG-MCG(24) tablet Take 1 tablet by mouth daily. 3 Package 4  . omeprazole (PRILOSEC) 20 MG capsule Take 20 mg by mouth daily.    . zaleplon (SONATA) 5 MG capsule Take 1 capsule (5 mg total) by mouth at bedtime as needed for sleep. 15 capsule 1   No current facility-administered medications for this visit.     Musculoskeletal: Strength & Muscle Tone: UTA Gait & Station: normal Patient leans: N/A  Psychiatric Specialty Exam: Review of Systems  Psychiatric/Behavioral: Negative for agitation, behavioral problems, confusion, decreased concentration, dysphoric mood, hallucinations, self-injury, sleep disturbance and suicidal ideas. The patient is not nervous/anxious and is not hyperactive.   All other systems reviewed and are negative.   There were no vitals taken for this visit.There is no height or weight on file to calculate BMI.  General Appearance: Casual  Eye Contact:  Fair  Speech:  Normal Rate  Volume:  Normal  Mood:  Euthymic  Affect:  Congruent  Thought Process:  Goal Directed and Descriptions of Associations: Intact  Orientation:  Full (Time, Place, and Person)  Thought Content: Logical   Suicidal Thoughts:  No  Homicidal Thoughts:  No  Memory:  Immediate;   Fair Recent;   Fair Remote;   Fair  Judgement:  Fair  Insight:  Fair  Psychomotor Activity:  Normal  Concentration:  Concentration: Fair and Attention Span: Fair  Recall:  [employer] of Knowledge: Fair  Language: Fair  Akathisia:  No  Handed:  Right  AIMS (if indicated): UTA  Assets:  Communication Skills Desire for Improvement Housing Social Support  ADL's:  Intact  Cognition: WNL  Sleep:  Fair   Screenings: AIMS     Office Visit from 12/11/2017 in [facility] Office Visit from 11/29/2017 in [facility] Office Visit from 11/20/2017 in  [facility] Admission (Discharged) from 05/21/2017 in [facility]  AIMS Total Score  1  7  5   0    AUDIT     Admission (Discharged) from 05/21/2017 in [facility]  Alcohol Use Disorder Identification Test Final Score (AUDIT)  6       Assessment and Plan: Sierra [name] is a 39 year old Caucasian female who has a history of bipolar disorder, anxiety disorder, binge eating disorder was evaluated by telemedicine today.  Patient is currently doing well with regards to her mood except for her sleep.  Patient will benefit from medication readjustment for sleep.  Plan as noted below.  Plan Bipolar disorder in remission Lamictal 150 mg p.o. daily Lithium 300 mg p.o. nightly-reduce dosage Lithium level dated 11/07/2018-0.1-subtherapeutic Patient will benefit from lithium level. Abilify 30 mg p.o. daily Wellbutrin XL 150 mg p.o. daily Continue CBT  Anxiety disorder-stable Hydroxyzine as needed Continue CBT  ADHD inattentive type-stable Vyvanse 40 mg p.o. daily I have reviewed [place] controlled substance database I have provided 2 prescriptions with date specified-last 1 to be filled on or after 06/25/2019 EKG pending  Insomnia-unstable Discontinue Lunesta for lack of benefit Start Sonata 5 mg p.o. nightly as needed   Follow-up in clinic in 6-8 weeks or sooner if needed.  I have spent atleast 20 minutes non face to face with  patient today. More than 50 % of the time was spent for preparing to see the patient ( e.g., review of test, records ), obtaining and to review and separately obtained history , ordering medications and test ,psychoeducation and supportive psychotherapy and care coordination,as well as documenting clinical information in electronic health record. This note was generated in part or whole with voice recognition software. Voice recognition is usually quite accurate but there are transcription errors  that can and very often do occur. I apologize for any typographical errors that were not detected and corrected.        Ursula Alert, MD 06/03/2019, 5:40 PM

## 2019-06-09 ENCOUNTER — Other Ambulatory Visit: Payer: Self-pay | Admitting: Child and Adolescent Psychiatry

## 2019-06-09 DIAGNOSIS — F411 Generalized anxiety disorder: Secondary | ICD-10-CM

## 2019-06-09 NOTE — Telephone Encounter (Signed)
Hi Dr. Elna Breslow, forwarding you the med request of you patient.   Thanks

## 2019-07-03 ENCOUNTER — Telehealth: Payer: Self-pay

## 2019-07-03 DIAGNOSIS — G4700 Insomnia, unspecified: Secondary | ICD-10-CM

## 2019-07-03 DIAGNOSIS — F3176 Bipolar disorder, in full remission, most recent episode depressed: Secondary | ICD-10-CM

## 2019-07-03 MED ORDER — ZALEPLON 10 MG PO CAPS: 10.0000 mg | ORAL_CAPSULE | Freq: Every evening | ORAL | 1 refills | Status: DC | PRN

## 2019-07-03 NOTE — Telephone Encounter (Signed)
pt called states that  she needed to refill on sonata.  pt states that she been [place] 2

## 2019-07-03 NOTE — Telephone Encounter (Signed)
I have sent Sonata with dosage increase of 10 mg to pharmacy as requested.

## 2019-07-17 ENCOUNTER — Telehealth: Payer: Self-pay

## 2019-07-17 DIAGNOSIS — F900 Attention-deficit hyperactivity disorder, predominantly inattentive type: Secondary | ICD-10-CM

## 2019-07-17 DIAGNOSIS — F3176 Bipolar disorder, in full remission, most recent episode depressed: Secondary | ICD-10-CM

## 2019-07-17 DIAGNOSIS — F411 Generalized anxiety disorder: Secondary | ICD-10-CM

## 2019-07-17 DIAGNOSIS — G4700 Insomnia, unspecified: Secondary | ICD-10-CM

## 2019-07-17 MED ORDER — BELSOMRA 10 MG PO TABS: 5.0000 mg | ORAL_TABLET | Freq: Every day | ORAL | 1 refills | Status: DC

## 2019-07-17 MED ORDER — LISDEXAMFETAMINE DIMESYLATE 40 MG PO CAPS: 40.0000 mg | ORAL_CAPSULE | ORAL | 0 refills | Status: DC

## 2019-07-17 MED ORDER — GABAPENTIN 100 MG PO CAPS: 100.0000 mg | ORAL_CAPSULE | Freq: Three times a day (TID) | ORAL | 1 refills | Status: DC

## 2019-07-17 MED ORDER — CLONAZEPAM 0.5 MG PO TABS: 0.5000 mg | ORAL_TABLET | ORAL | 0 refills | Status: DC

## 2019-07-17 NOTE — Telephone Encounter (Signed)
pt called states that the medication is not working for her . she states that she is reallly depressed and crying

## 2019-07-17 NOTE — Telephone Encounter (Signed)
Returned call to patient.  She reports she is currently going through a break-up.  She is unable to sleep, has crying spell and feels hopeless.  She also had moments when she felt like she wanted everything to be done and wanted to walk away.  She however lost her children and will not do anything to hurt herself.  She does not believe the Kathaleen Bury is helping anymore for the sleep.  We will discontinue Sonata start Belsomra 10 mg p.o. nightly  Add Klonopin 0.5 mg p.o. as needed for severe panic symptoms  Start gabapentin 100 mg p.o. 3 times daily for her mood symptoms.  She continues to follow-up with therapist and has upcoming appointment on Monday.  Crisis plan discussed with patient, patient advised to go to the nearest emergency department if she has suicidal thoughts or if her symptoms worsen.  She agrees to do so.

## 2019-08-11 ENCOUNTER — Encounter: Payer: Self-pay | Admitting: Psychiatry

## 2019-08-11 ENCOUNTER — Other Ambulatory Visit: Payer: Self-pay

## 2019-08-11 ENCOUNTER — Telehealth (INDEPENDENT_AMBULATORY_CARE_PROVIDER_SITE_OTHER): Admitting: Psychiatry

## 2019-08-11 ENCOUNTER — Telehealth: Payer: Self-pay

## 2019-08-11 DIAGNOSIS — F900 Attention-deficit hyperactivity disorder, predominantly inattentive type: Secondary | ICD-10-CM

## 2019-08-11 DIAGNOSIS — G4700 Insomnia, unspecified: Secondary | ICD-10-CM

## 2019-08-11 DIAGNOSIS — F3176 Bipolar disorder, in full remission, most recent episode depressed: Secondary | ICD-10-CM | POA: Diagnosis not present

## 2019-08-11 DIAGNOSIS — Z79899 Other long term (current) drug therapy: Secondary | ICD-10-CM

## 2019-08-11 DIAGNOSIS — F411 Generalized anxiety disorder: Secondary | ICD-10-CM

## 2019-08-11 MED ORDER — LISDEXAMFETAMINE DIMESYLATE 40 MG PO CAPS: 40.0000 mg | ORAL_CAPSULE | ORAL | 0 refills | Status: DC

## 2019-08-11 NOTE — Progress Notes (Signed)
Provider Location : [facility] Patient Location : Home  Virtual Visit via Video Note  I connected with Sierra [name] on 08/11/19 at 11:00 AM EDT by a video enabled telemedicine application and verified that I am speaking with the correct person using two identifiers.   I discussed the limitations of evaluation and management by telemedicine and the availability of in person appointments. The patient expressed understanding and agreed to proceed.   I discussed the assessment and treatment plan with the patient. The patient was provided an opportunity to ask questions and all were answered. The patient agreed with the plan and demonstrated an understanding of the instructions.   The patient was advised to call back or seek an in-person evaluation if the symptoms worsen or if the condition fails to improve as anticipated.  BH MD OP Progress Note  08/11/2019 9:07 PM Sierra [name]  MRN:  [id number]  Chief Complaint:  Chief Complaint    Follow-up     HPI: Sierra [name] is a 39 year old Caucasian female, married, lives in [place], has a history of bipolar disorder, GAD, binge eating disorder, ADHD was evaluated by telemedicine today.  Patient today reports she is currently feeling better.  She does feel sad on and off however that is currently improving.  She reports her most recent episode of depressive symptoms were triggered by her break-up with her boyfriend.  However they are currently back together.  That has helped the situation a lot.  She reports she could not afford the Belsomra.  She continues to use sonata as needed which helps her to fall asleep.  She however reports she gets only 6 hours of sleep at night and wakes up feeling rested.  She wonders whether she can take something at night if her sleep is interrupted after she falls asleep.  Patient reports that gabapentin is beneficial for her anxiety symptoms.  She denies side effects.  She is compliant on all her  medications and denies side effects.  She continues to follow-up with her therapist on a weekly basis which is helpful.  She denies any suicidality, homicidality or perceptual disturbances.  Patient denies any other concerns today. Visit Diagnosis:    ICD-10-CM   1. Bipolar disorder, in full remission, most recent episode depressed (HCC)  F31.76   2. GAD (generalized anxiety disorder)  F41.1   3. Attention deficit hyperactivity disorder (ADHD), predominantly inattentive type  F90.0 lisdexamfetamine (VYVANSE) 40 MG capsule  4. Insomnia, unspecified type  G47.00   5. High risk medication use  Z79.899 Lithium level    Basic metabolic panel    TSH    Lipid panel    Hemoglobin A1C    Past Psychiatric History: I have reviewed past psychiatric history from my progress note on 06/20/2017  Past Medical History:  Past Medical History:  Diagnosis Date  . Abdominal pain, other specified site   . ADHD   . 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 Anemia   . Anxiety state, unspecified   . Bipolar 1 disorder, depressed (HCC) 05/21/2017  . BIPOLAR AFFECTIVE DISORDER 12/23/2006   Qualifier: Diagnosis of  By: Ermalene Searing MD, Amy    . Bipolar affective disorder (HCC) 10/12/2011  . Bipolar disorder, unspecified (HCC)   . Depressive disorder, not elsewhere classified   . Edema   . Esophageal reflux   . Fibromyalgia   . Headache   . History of kidney stones   . Mild or unspecified pre-eclampsia, unspecified as to episode of  care   . Morbid obesity (HCC)   . Myalgia and myositis, unspecified   . Other dyspnea and respiratory abnormality   . Preeclampsia    with first pregnancy  . Raynaud's disease   . Urinary tract infection, site not specified     Past Surgical History:  Procedure Laterality Date  . CESAREAN SECTION     X 2  . CHOLECYSTECTOMY  03/2006  . CYSTOSCOPY W/ URETERAL STENT PLACEMENT Left 05/14/2016   Procedure: CYSTOSCOPY WITH STENT REPLACEMENT;  Surgeon: [name], MD;   Location: [facility];  Service: Urology;  Laterality: Left;  . CYSTOSCOPY WITH STENT PLACEMENT Left 04/25/2016   Procedure: CYSTOSCOPY WITH STENT PLACEMENT;  Surgeon: [name], MD;  Location: [facility];  Service: Urology;  Laterality: Left;  . DILATION AND CURETTAGE OF UTERUS    . IVC FILTER INSERTION  03/26/2016   [facility]  . LAPAROSCOPIC GASTRIC RESTRICTIVE DUODENAL PROCEDURE (DUODENAL SWITCH)  03/2016  . LITHOTRIPSY    . TONSILLECTOMY AND ADENOIDECTOMY    . URETEROSCOPY WITH HOLMIUM LASER LITHOTRIPSY Left 05/14/2016   Procedure: URETEROSCOPY WITH HOLMIUM LASER LITHOTRIPSY;  Surgeon: [name], MD;  Location: [facility];  Service: Urology;  Laterality: Left;    Family Psychiatric History: I have reviewed family psychiatric history from my progress note on 06/20/2017  Family History:  Family History  Problem Relation Age of Onset  . Depression Father   . Alcohol abuse Father   . Depression Mother   . Hyperlipidemia Mother   . Sleep apnea Mother   . Depression Sister   . Hyperlipidemia Brother   . Depression Brother   . Hyperlipidemia Brother   . Depression Brother   . Alcohol abuse Brother   . Coronary artery disease Maternal Grandmother   . Heart attack Maternal Grandmother   . Diabetes Maternal Grandmother   . Lung cancer Maternal Grandfather   . Emphysema Maternal Grandfather   . Coronary artery disease Maternal Grandfather   . Lupus Other        Aunt  . Fibromyalgia Other        Aunt    Social History: I have reviewed social history from my progress note on 06/20/2017 Social History   Socioeconomic History  . Marital status: Married    Spouse name: brian  . Number of children: 2  . Years of education: Not on file  . Highest education level: Some college, no degree  Occupational History  . Occupation: Home maker  Tobacco Use  . Smoking status: Never Smoker  . Smokeless tobacco: Never Used  Vaping Use  . Vaping Use: Never used  Substance and Sexual  Activity  . Alcohol use: Yes    Alcohol/week: 2.0 - 3.0 standard drinks    Types: 2 Glasses of wine per week  . Drug use: No  . Sexual activity: Not Currently    Partners: Male    Birth control/protection: None, I.U.D.  Other Topics Concern  . Not on file  Social History Narrative   1 child, 2 step-sons      Regular exercise-no   Diet: no fast food, likes sweets   Social Determinants of Health   Financial Resource Strain:   . Difficulty of Paying Living Expenses:   Food Insecurity:   . Worried About Programme researcher, broadcasting/film/video in the Last Year:   . Barista in the Last Year:   Transportation Needs:   . Freight forwarder (Medical):   Marland Kitchen Lack of  Transportation (Non-Medical):   Physical Activity:   . Days of Exercise per Week:   . Minutes of Exercise per Session:   Stress:   . Feeling of Stress :   Social Connections:   . Frequency of Communication with Friends and Family:   . Frequency of Social Gatherings with Friends and Family:   . Attends Religious Services:   . Active Member of Clubs or Organizations:   . Attends Banker Meetings:   Marland Kitchen Marital Status:     Allergies:  Allergies  Allergen Reactions  . Uncaria Tomentosa (Cats Claw) Other (See Comments)    allgeric to cats in general causes sneezing  . Bee Pollen Itching  . Molds & Smuts Other (See Comments)  . Pollen Extract   . Milk-Related Compounds     Able to tolerate yogurt; mild intolerance to milk/cheese  . Pineapple Rash  . Soy Allergy Diarrhea  . Strawberry Extract Rash    Break out on tongue noted Break out on tongue noted Break out on tongue noted    Metabolic Disorder Labs: Lab Results  Component Value Date   HGBA1C 4.7 (L) 05/22/2017   MPG 88.19 05/22/2017   Lab Results  Component Value Date   PROLACTIN 7.6 11/07/2018   Lab Results  Component Value Date   CHOL 105 05/22/2017   TRIG 41 05/22/2017   HDL 48 05/22/2017   CHOLHDL 2.2 05/22/2017   VLDL 8 05/22/2017    LDLCALC 49 05/22/2017   LDLCALC 99 09/11/2013   Lab Results  Component Value Date   TSH 0.504 05/21/2018   TSH 0.677 05/22/2017    Therapeutic Level Labs: Lab Results  Component Value Date   LITHIUM 0.1 (L) 11/07/2018   LITHIUM 0.3 (L) 11/26/2017   No results found for: VALPROATE No components found for:  CBMZ  Current Medications: Current Outpatient Medications  Medication Sig Dispense Refill  . ARIPiprazole (ABILIFY) 30 MG tablet Take 1 tablet (30 mg total) by mouth daily. 90 tablet 0  . Biotin 5000 MCG TABS Take 1 tablet by mouth daily.    Marland Kitchen buPROPion (WELLBUTRIN XL) 150 MG 24 hr tablet Take 1 tablet (150 mg total) by mouth daily. 90 tablet 0  . calcium citrate-vitamin D 500-400 MG-UNIT chewable tablet Chew 1 tablet by mouth daily.     . clonazePAM (KLONOPIN) 0.5 MG tablet Take 1 tablet (0.5 mg total) by mouth as directed. Take once every few days for severe panic attacks only. 10 tablet 0  . docusate sodium (COLACE) 100 MG capsule Take 300 mg by mouth daily.    . ferrous sulfate 325 (65 FE) MG tablet Take 325 mg by mouth daily with breakfast.    . gabapentin (NEURONTIN) 100 MG capsule Take 1 capsule (100 mg total) by mouth 3 (three) times daily. 90 capsule 1  . hydrOXYzine (ATARAX/VISTARIL) 25 MG tablet TAKE 1 TABLET (25 MG TOTAL) BY MOUTH DAILY AS NEEDED FOR ANXIETY. 90 tablet 1  . lamoTRIgine (LAMICTAL) 100 MG tablet Take 1 tablet (100 mg total) by mouth daily. 90 tablet 0  . lamoTRIgine (LAMICTAL) 25 MG tablet Take 2 tablets (50 mg total) by mouth daily. To be added to 100 mg daily 180 tablet 0  . levonorgestrel (MIRENA) 20 MCG/24HR IUD 1 each by Intrauterine route once.    . lisdexamfetamine (VYVANSE) 40 MG capsule Take 1 capsule (40 mg total) by mouth every morning. 30 capsule 0  . [START ON 09/01/2019] lisdexamfetamine (VYVANSE) 40 MG capsule Take  1 capsule (40 mg total) by mouth every morning. 30 capsule 0  . lithium carbonate (LITHOBID) 300 MG CR tablet TAKE 1 TABLET BY  MOUTH EVERY DAY WITH SUPPER 90 tablet 0  . montelukast (SINGULAIR) 10 MG tablet Take 10 mg by mouth daily.    . Multiple Vitamin (MULTIVITAMIN) tablet Take 1 tablet by mouth daily.    Marland Kitchen NIFEdipine (PROCARDIA-XL/ADALAT CC) 60 MG 24 hr tablet Take 60 mg by mouth daily.     . Norethindrone Acetate-Ethinyl Estrad-FE (LOESTRIN 24 FE) 1-20 MG-MCG(24) tablet Take 1 tablet by mouth daily. 3 Package 4  . omeprazole (PRILOSEC) 20 MG capsule Take 20 mg by mouth daily.     No current facility-administered medications for this visit.     Musculoskeletal: Strength & Muscle Tone: UTA Gait & Station: normal Patient leans: N/A  Psychiatric Specialty Exam: Review of Systems  Psychiatric/Behavioral: Positive for dysphoric mood and sleep disturbance.  All other systems reviewed and are negative.   There were no vitals taken for this visit.There is no height or weight on file to calculate BMI.  General Appearance: Casual  Eye Contact:  Fair  Speech:  Normal Rate  Volume:  Normal  Mood:  Depressed improving  Affect:  Congruent  Thought Process:  Goal Directed and Descriptions of Associations: Intact  Orientation:  Full (Time, Place, and Person)  Thought Content: Logical   Suicidal Thoughts:  No  Homicidal Thoughts:  No  Memory:  Immediate;   Fair Recent;   Fair Remote;   Fair  Judgement:  Fair  Insight:  Fair  Psychomotor Activity:  Normal  Concentration:  Concentration: Fair and Attention Span: Fair  Recall:  [employer] of Knowledge: Fair  Language: Fair  Akathisia:  No  Handed:  Right  AIMS (if indicated): UTA  Assets:  Communication Skills Desire for Improvement Housing Social Support  ADL's:  Intact  Cognition: WNL  Sleep:  Restless   Screenings: AIMS     Office Visit from 12/11/2017 in [facility] Office Visit from 11/29/2017 in [facility] Office Visit from 11[facility]20/2017 in [facility] Admission  (Discharged) from 05/21/2017 in [facility]  AIMS Total Score 1 7 5  0    AUDIT     Admission (Discharged) from 05/21/2017 in [facility]ICINE  Alcohol Use Disorder Identification Test Final Score (AUDIT) 6       Assessment and Plan: AUBREY BLACKARD is a 39 year old Caucasian female who has a history of bipolar disorder, anxiety disorder, binge eating disorder was evaluated by telemedicine today.  Patient is currently struggling with relationship stressors.  She however is making progress and will continue to need medication management and psychotherapy sessions.  Plan as noted below.  Plan Bipolar disorder in remission Lamictal 150 mg p.o. daily Lithium 300 mg p.o. nightly-reduced dosage Lithium level dated 11/07/2018-0.1-subtherapeutic. Will order lithium level today. Abilify 30 mg p.o. daily Wellbutrin XL 150 mg p.o. daily If patient continues to remain stable she can be tapered off of some of these medications like Abilify or Lamictal. Continue CBT  Anxiety disorder-stable Continue gabapentin 100 mg p.o. 3 times daily Hydroxyzine as needed Continue CBT  ADHD inattentive type-stable Vyvanse 40 mg p.o. daily I have reviewed [place] controlled substance database I have sent prescription to the pharmacy with date specified for the month of August.   Insomnia-restless Continue Sonata 5 mg p.o. nightly as needed She did not pick up Belsomra due to  cost. Discussed with patient use hydroxyzine 12.5 to 25 mg as needed for interrupted sleep.  High risk medication use-will order lithium level, BMP, TSH, lipid panel, hemoglobin A1c.  Discussed with patient that lab [facility].  She will get it done soon.  Follow-up in clinic in 6 to 8 weeks or sooner if needed.  I have spent atleast 30 minutes non face to face with patient today. More than 50 % of the time was spent for preparing to see the patient ( e.g., review of test, records  ), obtaining and to review and separately obtained history , ordering medications and test ,psychoeducation and supportive psychotherapy and care coordination,as well as documenting clinical information in electronic health record. This note was generated in part or whole with voice recognition software. Voice recognition is usually quite accurate but there are transcription errors that can and very often do occur. I apologize for any typographical errors that were not detected and corrected.     Jomarie LongsSaramma Harutyun Monteverde, MD 08/11/2019, 9:07 PM

## 2019-08-11 NOTE — Telephone Encounter (Signed)
labwork faxed and confirmed to [facility] lab

## 2019-08-13 ENCOUNTER — Other Ambulatory Visit
Admission: RE | Admit: 2019-08-13 | Discharge: 2019-08-13 | Disposition: A | Discharge status: Home / Self Care | Attending: Psychiatry | Admitting: Psychiatry

## 2019-08-13 DIAGNOSIS — Z79899 Other long term (current) drug therapy: Secondary | ICD-10-CM | POA: Diagnosis present

## 2019-08-13 LAB — BASIC METABOLIC PANEL
Anion gap: 8 (ref 5–15)
BUN: 12 mg/dL (ref 6–20)
CO2: 25 mmol/L (ref 22–32)
Calcium: 9.4 mg/dL (ref 8.9–10.3)
Chloride: 108 mmol/L (ref 98–111)
Creatinine, Ser: 1.09 mg/dL — ABNORMAL HIGH (ref 0.44–1.00)
GFR calc Af Amer: >60 mL/min (ref >60)
GFR calc non Af Amer: >60 mL/min (ref >60)
Glucose, Bld: 96 mg/dL (ref 70–99)
Potassium: 4.2 mmol/L (ref 3.5–5.1)
Sodium: 141 mmol/L (ref 135–145)

## 2019-08-13 LAB — LITHIUM LEVEL: Lithium Lvl: 0.18 mmol/L — ABNORMAL LOW (ref 0.60–1.20)

## 2019-09-29 ENCOUNTER — Telehealth: Admitting: Psychiatry

## 2019-10-01 ENCOUNTER — Telehealth: Payer: Self-pay

## 2019-10-01 DIAGNOSIS — F411 Generalized anxiety disorder: Secondary | ICD-10-CM

## 2019-10-01 MED ORDER — GABAPENTIN 100 MG PO CAPS: 100.0000 mg | ORAL_CAPSULE | Freq: Three times a day (TID) | ORAL | 1 refills | Status: DC

## 2019-10-01 NOTE — Telephone Encounter (Signed)
Patient called requesting a refill on her Gabapentin 100mg  to be sent to [facility] [address] in [place]. She's completely out. Follow up scheduled for 10/06/19. Thank you.

## 2019-10-01 NOTE — Telephone Encounter (Signed)
I have sent Gabapentin to pharmacy. ?

## 2019-10-06 ENCOUNTER — Other Ambulatory Visit: Payer: Self-pay

## 2019-10-06 ENCOUNTER — Encounter: Payer: Self-pay | Admitting: Psychiatry

## 2019-10-06 ENCOUNTER — Telehealth (INDEPENDENT_AMBULATORY_CARE_PROVIDER_SITE_OTHER): Admitting: Psychiatry

## 2019-10-06 DIAGNOSIS — F3176 Bipolar disorder, in full remission, most recent episode depressed: Secondary | ICD-10-CM

## 2019-10-06 DIAGNOSIS — F900 Attention-deficit hyperactivity disorder, predominantly inattentive type: Secondary | ICD-10-CM

## 2019-10-06 DIAGNOSIS — F411 Generalized anxiety disorder: Secondary | ICD-10-CM | POA: Diagnosis not present

## 2019-10-06 DIAGNOSIS — Z79899 Other long term (current) drug therapy: Secondary | ICD-10-CM

## 2019-10-06 DIAGNOSIS — F5105 Insomnia due to other mental disorder: Secondary | ICD-10-CM | POA: Diagnosis not present

## 2019-10-06 MED ORDER — LAMOTRIGINE 25 MG PO TABS: 50.0000 mg | ORAL_TABLET | Freq: Every day | ORAL | 0 refills | Status: DC

## 2019-10-06 MED ORDER — LITHIUM CARBONATE ER 300 MG PO TBCR: EXTENDED_RELEASE_TABLET | ORAL | 0 refills | Status: DC

## 2019-10-06 MED ORDER — AMPHETAMINE-DEXTROAMPHET ER 20 MG PO CP24: 20.0000 mg | ORAL_CAPSULE | Freq: Every day | ORAL | 0 refills | Status: DC

## 2019-10-06 MED ORDER — LAMOTRIGINE 100 MG PO TABS: 100.0000 mg | ORAL_TABLET | Freq: Every day | ORAL | 0 refills | Status: DC

## 2019-10-06 MED ORDER — ZALEPLON 10 MG PO CAPS: 10.0000 mg | ORAL_CAPSULE | Freq: Every evening | ORAL | 2 refills | Status: DC | PRN

## 2019-10-06 MED ORDER — BUPROPION HCL ER (XL) 150 MG PO TB24: 150.0000 mg | ORAL_TABLET | Freq: Every day | ORAL | 0 refills | Status: DC

## 2019-10-06 NOTE — Progress Notes (Signed)
Provider Location : [facility] Patient Location : Home  Participants: Patient , Provider  Virtual Visit via Video Note  I connected with Sierra [name] on 10/06/19 at  1:30 PM EDT by a video enabled telemedicine application and verified that I am speaking with the correct person using two identifiers.   I discussed the limitations of evaluation and management by telemedicine and the availability of in person appointments. The patient expressed understanding and agreed to proceed.      I discussed the assessment and treatment plan with the patient. The patient was provided an opportunity to ask questions and all were answered. The patient agreed with the plan and demonstrated an understanding of the instructions.   The patient was advised to call back or seek an in-person evaluation if the symptoms worsen or if the condition fails to improve as anticipated.   BH MD OP Progress Note  10/06/2019 1:52 PM ESTEPHANI [name]  MRN:  [id number]  Chief Complaint:  Chief Complaint    Follow-up     HPI: Sierra [name] is a 39 year old Caucasian female, married, lives in [place], has a history of bipolar disorder, GAD, binge eating disorder, ADHD was evaluated by telemedicine today.  Patient today reports she is currently doing okay with regards to her mood.  She does have some anxiety due to starting school back however she has been able to cope with it.  She reports sleep is good.  She denies suicidality, homicidality or perceptual disturbances.  Patient however reports she is currently struggling with attention and focus problems.  She does not believe the Vyvanse at this dosage is doing anything for her.  She reports it takes her 2 hours or more for it to kick in once she takes it in the morning.  She reports it last only 2 to 3 hours and hence she has been struggling at school.  Patient reports she is interested in changing her Vyvanse now since mood wise she is at a better place  now.  Patient denies any other concerns today.  Visit Diagnosis:    ICD-10-CM   1. Bipolar disorder, in full remission, most recent episode depressed (HCC)  F31.76 lamoTRIgine (LAMICTAL) 100 MG tablet    buPROPion (WELLBUTRIN XL) 150 MG 24 hr tablet    lithium carbonate (LITHOBID) 300 MG CR tablet    zaleplon (SONATA) 10 MG capsule  2. GAD (generalized anxiety disorder)  F41.1 lamoTRIgine (LAMICTAL) 100 MG tablet    lamoTRIgine (LAMICTAL) 25 MG tablet    lithium carbonate (LITHOBID) 300 MG CR tablet  3. Attention deficit hyperactivity disorder (ADHD), predominantly inattentive type  F90.0 amphetamine-dextroamphetamine (ADDERALL XR) 20 MG 24 hr capsule  4. Insomnia due to mental condition  F51.05 lamoTRIgine (LAMICTAL) 25 MG tablet  5. High risk medication use  Z79.899     Past Psychiatric History: I have reviewed past psychiatric history from my progress note on 06/20/2017  Past Medical History:  Past Medical History:  Diagnosis Date  . Abdominal pain, other specified site   . ADHD   . 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 Anemia   . Anxiety state, unspecified   . Bipolar 1 disorder, depressed (HCC) 05/21/2017  . BIPOLAR AFFECTIVE DISORDER 12/23/2006   Qualifier: Diagnosis of  By: Ermalene Searing MD, Amy    . Bipolar affective disorder (HCC) 10/12/2011  . Bipolar disorder, unspecified (HCC)   . Depressive disorder, not elsewhere classified   . Edema   . Esophageal reflux   .  Fibromyalgia   . Headache   . History of kidney stones   . Mild or unspecified pre-eclampsia, unspecified as to episode of care   . Morbid obesity (HCC)   . Myalgia and myositis, unspecified   . Other dyspnea and respiratory abnormality   . Preeclampsia    with first pregnancy  . Raynaud's disease   . Urinary tract infection, site not specified     Past Surgical History:  Procedure Laterality Date  . CESAREAN SECTION     X 2  . CHOLECYSTECTOMY  03/2006  . CYSTOSCOPY W/ URETERAL STENT PLACEMENT Left  05/14/2016   Procedure: CYSTOSCOPY WITH STENT REPLACEMENT;  Surgeon: [name], MD;  Location: [facility];  Service: Urology;  Laterality: Left;  . CYSTOSCOPY WITH STENT PLACEMENT Left 04/25/2016   Procedure: CYSTOSCOPY WITH STENT PLACEMENT;  Surgeon: [name], MD;  Location: [facility];  Service: Urology;  Laterality: Left;  . DILATION AND CURETTAGE OF UTERUS    . IVC FILTER INSERTION  03/26/2016   [facility]  . LAPAROSCOPIC GASTRIC RESTRICTIVE DUODENAL PROCEDURE (DUODENAL SWITCH)  03/2016  . LITHOTRIPSY    . TONSILLECTOMY AND ADENOIDECTOMY    . URETEROSCOPY WITH HOLMIUM LASER LITHOTRIPSY Left 05/14/2016   Procedure: URETEROSCOPY WITH HOLMIUM LASER LITHOTRIPSY;  Surgeon: [name], MD;  Location: [facility];  Service: Urology;  Laterality: Left;    Family Psychiatric History: I have reviewed family psychiatric history from my progress note on 06/20/2017  Family History:  Family History  Problem Relation Age of Onset  . Depression Father   . Alcohol abuse Father   . Depression Mother   . Hyperlipidemia Mother   . Sleep apnea Mother   . Depression Sister   . Hyperlipidemia Brother   . Depression Brother   . Hyperlipidemia Brother   . Depression Brother   . Alcohol abuse Brother   . Coronary artery disease Maternal Grandmother   . Heart attack Maternal Grandmother   . Diabetes Maternal Grandmother   . Lung cancer Maternal Grandfather   . Emphysema Maternal Grandfather   . Coronary artery disease Maternal Grandfather   . Lupus Other        Aunt  . Fibromyalgia Other        Aunt    Social History: I have reviewed social history from my progress note on 06/20/2017 Social History   Socioeconomic History  . Marital status: Married    Spouse name: brian  . Number of children: 2  . Years of education: Not on file  . Highest education level: Some college, no degree  Occupational History  . Occupation: Home maker  Tobacco Use  . Smoking status: Never Smoker  .  Smokeless tobacco: Never Used  Vaping Use  . Vaping Use: Never used  Substance and Sexual Activity  . Alcohol use: Yes    Alcohol/week: 2.0 - 3.0 standard drinks    Types: 2 Glasses of wine per week  . Drug use: No  . Sexual activity: Not Currently    Partners: Male    Birth control/protection: None, I.U.D.  Other Topics Concern  . Not on file  Social History Narrative   1 child, 2 step-sons      Regular exercise-no   Diet: no fast food, likes sweets   Social Determinants of Health   Financial Resource Strain:   . Difficulty of Paying Living Expenses: Not on file  Food Insecurity:   . Worried About Programme researcher, broadcasting/film/video in the Last Year: Not  on file  . Ran Out of Food in the Last Year: Not on file  Transportation Needs:   . Lack of Transportation (Medical): Not on file  . Lack of Transportation (Non-Medical): Not on file  Physical Activity:   . Days of Exercise per Week: Not on file  . Minutes of Exercise per Session: Not on file  Stress:   . Feeling of Stress : Not on file  Social Connections:   . Frequency of Communication with Friends and Family: Not on file  . Frequency of Social Gatherings with Friends and Family: Not on file  . Attends Religious Services: Not on file  . Active Member of Clubs or Organizations: Not on file  . Attends Banker Meetings: Not on file  . Marital Status: Not on file    Allergies:  Allergies  Allergen Reactions  . Uncaria Tomentosa (Cats Claw) Other (See Comments)    allgeric to cats in general causes sneezing  . Bee Pollen Itching  . Molds & Smuts Other (See Comments)  . Pollen Extract   . Milk-Related Compounds     Able to tolerate yogurt; mild intolerance to milk/cheese  . Pineapple Rash  . Soy Allergy Diarrhea  . Strawberry Extract Rash    Break out on tongue noted Break out on tongue noted Break out on tongue noted    Metabolic Disorder Labs: Lab Results  Component Value Date   HGBA1C 4.7 (L) 05/22/2017    MPG 88.19 05/22/2017   Lab Results  Component Value Date   PROLACTIN 7.6 11/07/2018   Lab Results  Component Value Date   CHOL 105 05/22/2017   TRIG 41 05/22/2017   HDL 48 05/22/2017   CHOLHDL 2.2 05/22/2017   VLDL 8 05/22/2017   LDLCALC 49 05/22/2017   LDLCALC 99 09/11/2013   Lab Results  Component Value Date   TSH 0.504 05/21/2018   TSH 0.677 05/22/2017    Therapeutic Level Labs: Lab Results  Component Value Date   LITHIUM 0.18 (L) 08/13/2019   LITHIUM 0.1 (L) 11/07/2018   No results found for: VALPROATE No components found for:  CBMZ  Current Medications: Current Outpatient Medications  Medication Sig Dispense Refill  . amphetamine-dextroamphetamine (ADDERALL XR) 20 MG 24 hr capsule Take 1 capsule (20 mg total) by mouth daily. 8 capsule 0  . ARIPiprazole (ABILIFY) 30 MG tablet Take 1 tablet (30 mg total) by mouth daily. 90 tablet 0  . Biotin 5000 MCG TABS Take 1 tablet by mouth daily.    Marland Kitchen buPROPion (WELLBUTRIN XL) 150 MG 24 hr tablet Take 1 tablet (150 mg total) by mouth daily. 90 tablet 0  . calcium citrate-vitamin D 500-400 MG-UNIT chewable tablet Chew 1 tablet by mouth daily.     . ciclopirox (PENLAC) 8 % solution Apply topically at bedtime.    . clonazePAM (KLONOPIN) 0.5 MG tablet Take 1 tablet (0.5 mg total) by mouth as directed. Take once every few days for severe panic attacks only. 10 tablet 0  . docusate sodium (COLACE) 100 MG capsule Take 300 mg by mouth daily.    . ferrous sulfate 325 (65 FE) MG tablet Take 325 mg by mouth daily with breakfast.    . gabapentin (NEURONTIN) 100 MG capsule Take 1 capsule (100 mg total) by mouth 3 (three) times daily. 90 capsule 1  . hydrochlorothiazide (HYDRODIURIL) 25 MG tablet Take 25 mg by mouth daily as needed.    . hydrOXYzine (ATARAX/VISTARIL) 25 MG tablet TAKE 1 TABLET (  25 MG TOTAL) BY MOUTH DAILY AS NEEDED FOR ANXIETY. 90 tablet 1  . lamoTRIgine (LAMICTAL) 100 MG tablet Take 1 tablet (100 mg total) by mouth daily. 90  tablet 0  . lamoTRIgine (LAMICTAL) 25 MG tablet Take 2 tablets (50 mg total) by mouth daily. To be added to 100 mg daily 180 tablet 0  . levonorgestrel (MIRENA) 20 MCG/24HR IUD 1 each by Intrauterine route once.    . lithium carbonate (LITHOBID) 300 MG CR tablet TAKE 1 TABLET BY MOUTH EVERY DAY WITH SUPPER 90 tablet 0  . montelukast (SINGULAIR) 10 MG tablet Take 10 mg by mouth daily.    . Multiple Vitamin (MULTIVITAMIN) tablet Take 1 tablet by mouth daily.    Marland Kitchen NIFEdipine (PROCARDIA-XL/ADALAT CC) 60 MG 24 hr tablet Take 60 mg by mouth daily.     . Norethindrone Acetate-Ethinyl Estrad-FE (LOESTRIN 24 FE) 1-20 MG-MCG(24) tablet Take 1 tablet by mouth daily. 3 Package 4  . omeprazole (PRILOSEC) 20 MG capsule Take 20 mg by mouth daily.    . zaleplon (SONATA) 10 MG capsule Take 1 capsule (10 mg total) by mouth at bedtime as needed. 30 capsule 2   No current facility-administered medications for this visit.     Musculoskeletal: Strength & Muscle Tone: UTA Gait & Station: normal Patient leans: N/A  Psychiatric Specialty Exam: Review of Systems  Psychiatric/Behavioral: Positive for decreased concentration. Negative for self-injury and suicidal ideas. The patient is nervous/anxious.   All other systems reviewed and are negative.   There were no vitals taken for this visit.There is no height or weight on file to calculate BMI.  General Appearance: Casual  Eye Contact:  Fair  Speech:  Clear and Coherent  Volume:  Normal  Mood:  Anxious  Affect:  Congruent  Thought Process:  Goal Directed and Descriptions of Associations: Intact  Orientation:  Full (Time, Place, and Person)  Thought Content: Logical   Suicidal Thoughts:  No  Homicidal Thoughts:  No  Memory:  Immediate;   Fair Recent;   Fair Remote;   Fair  Judgement:  Fair  Insight:  Fair  Psychomotor Activity:  Normal  Concentration:  Concentration: Fair and Attention Span: Good  Recall:  [employer] of Knowledge: Fair  Language:  Fair  Akathisia:  No  Handed:  Right  AIMS (if indicated): UTA  Assets:  Communication Skills Desire for Improvement Housing Social Support  ADL's:  Intact  Cognition: WNL  Sleep:  Fair   Screenings: AIMS     Office Visit from 12/11/2017 in [facility] Office Visit from 11/29/2017 in [facility] Office Visit from 11/20/2017 in [facility] Admission (Discharged) from 05/21/2017 in [facility]  AIMS Total Score 0    AUDIT     Admission (Discharged) from 05/21/2017 in [facility]  Alcohol Use Disorder Identification Test Final Score (AUDIT) 6       Assessment and Plan: Sierra [name] is a 39 year old Caucasian female who has a history of bipolar disorder, anxiety disorder, binge eating disorder was evaluated by telemedicine today.  Patient is currently struggling with attention and focus problems.  Discussed plan as noted below.  Plan Bipolar disorder in remission Lamictal 150 mg p.o. daily Lithium 300 mg p.o. nightly-reduced dosage Lithium level reviewed and discussed with patient-dated 08/13/2019 - 0.18 Abilify 30 mg p.o. daily Wellbutrin XL 150 mg p.o. daily. If patient continues to remain stable she can be  tapered off of some of her medications like Abilify or Lamictal. Continue CBT as needed  Anxiety disorder-stable Gabapentin 100 mg p.o. 3 times daily Hydroxyzine as needed Continue CBT  ADHD inattentive type-unstable Discontinue Vyvanse for lack of benefit. Start Adderall extended release 20 mg p.o. daily. I have reviewed [place] controlled substance database.  Provided medication education.  She will monitor her symptoms closely and if she notices any side effects she will let writer know.  High risk medication use-reviewed the following labs with patient. BMP-dated 08/13/2019 within normal limits except for creatinine slightly elevated  at 1.09.  Patient will contact her primary care provider for repeat labs as needed. Lithium-0.18 subtherapeutic.  Will not make medication changes since she is on multiple mood stabilizers.  Her mood is also stable.  Follow-up in clinic in 3 to 4 weeks or sooner if needed.  I have spent atleast 20 minutes face to face with patient today. More than 50 % of the time was spent for preparing to see the patient ( e.g., review of test, records ),  ordering medications and test ,psychoeducation and supportive psychotherapy and care coordination,as well as documenting clinical information in electronic health record. This note was generated in part or whole with voice recognition software. Voice recognition is usually quite accurate but there are transcription errors that can and very often do occur. I apologize for any typographical errors that were not detected and corrected.         Jomarie LongsSaramma Jerrid Forgette, MD 10/07/2019, 8:11 AM

## 2019-10-06 NOTE — Patient Instructions (Signed)
Amphetamine; Dextroamphetamine extended-release capsules What is this medicine? AMPHETAMINE; DEXTROAMPHETAMINE (am FET a meen; dex troe am FET a meen) is used to treat attention-deficit hyperactivity disorder (ADHD). Federal law prohibits giving this medicine to any person other than the person for whom it was prescribed. Do not share this medicine with anyone else. This medicine may be used for other purposes; ask your health care provider or pharmacist if you have questions. COMMON BRAND NAME(S): Adderall XR, Mydayis What should I tell my health care provider before I take this medicine? They need to know if you have any of these conditions:  anxiety or panic attacks  circulation problems in fingers and toes  glaucoma  hardening or blockages of the arteries or heart blood vessels  heart disease or a heart defect  high blood pressure  history of a drug or alcohol abuse problem  history of stroke  kidney disease  liver disease  mental illness  seizures  suicidal thoughts, plans, or attempt; a previous suicide attempt by you or a family member  thyroid disease  Tourette's syndrome  an unusual or allergic reaction to dextroamphetamine, other amphetamines, other medicines, foods, dyes, or preservatives  pregnant or trying to get pregnant  breast-feeding How should I use this medicine? Take this medicine by mouth with a glass of water. Follow the directions on the prescription label. This medicine is taken just one time per day, usually in the morning after waking up. Take with or without food. Do not chew or crush this medicine. You may open the capsules and sprinkle the medicine on a spoonful of applesauce. If sprinkled on applesauce, take the dose immediately and do not crush or chew. Do not take your medicine more often than directed. A special MedGuide will be given to you by the pharmacist with each prescription and refill. Be sure to read this information carefully  each time. Talk to your pediatrician regarding the use of this medicine in children. While this drug may be prescribed for children as young as 6 years for selected conditions, precautions do apply. Some extended-release capsules are recommended for use only in children 13 years of age and older. Overdosage: If you think you have taken too much of this medicine contact a poison control center or emergency room at once. NOTE: This medicine is only for you. Do not share this medicine with others. What if I miss a dose? If you miss a dose, take it as soon as you can in the morning, but do not take it later in the day because it can cause trouble sleeping. If it is almost time for your next dose, take only that dose. Do not take double or extra doses. What may interact with this medicine? Do not take this medicine with any of the following medications:  MAOIs like Carbex, Eldepryl, Marplan, Nardil, and Parnate  other stimulant medicines for attention disorders This medicine may also interact with the following medications:  acetazolamide  alcohol  ammonium chloride  antacids  ascorbic acid  atomoxetine  caffeine  certain medicines for blood pressure  certain medicines for depression, anxiety, or psychotic disturbances  certain medicines for seizures like carbamazepine, phenobarbital, phenytoin  certain medicines for stomach problems like cimetidine, ranitidine, famotidine, esomeprazole, omeprazole, lansoprazole, pantoprazole  lithium  medicines for colds and breathing difficulties  medicines for diabetes  medicines or dietary supplements for weight loss or to stay awake  methenamine  narcotic medicines for pain  quinidine  ritonavir  sodium bicarbonate  St.   John's wort This list may not describe all possible interactions. Give your health care provider a list of all the medicines, herbs, non-prescription drugs, or dietary supplements you use. Also tell them if you  smoke, drink alcohol, or use illegal drugs. Some items may interact with your medicine. What should I watch for while using this medicine? Visit your doctor or health care professional for regular checks on your progress. This prescription requires that you follow special procedures with your doctor and pharmacy. You will need to have a new written prescription from your doctor every time you need a refill. This medicine may affect your concentration, or hide signs of tiredness. Until you know how this medicine affects you, do not drive, ride a bicycle, use machinery, or do anything that needs mental alertness. Alcohol should be avoided with some brands of this medicine. Talk to your doctor or health care professional if you have questions. Tell your doctor or health care professional if this medicine loses its effects, or if you feel you need to take more than the prescribed amount. Do not change the dosage without talking to your doctor or health care professional. Decreased appetite is a common side effect when starting this medicine. Eating small, frequent meals or snacks can help. Talk to your doctor if you continue to have poor eating habits. Height and weight growth of a child taking this medicine will be monitored closely. Do not take this medicine close to bedtime. It may prevent you from sleeping. If you are going to need surgery, an MRI, a CT scan, or other procedure, tell your doctor that you are taking this medicine. You may need to stop taking this medicine before the procedure. Tell your doctor or healthcare professional right away if you notice unexplained wounds on your fingers and toes while taking this medicine. You should also tell your healthcare provider if you experience numbness or pain, changes in the skin color, or sensitivity to temperature in your fingers or toes. What side effects may I notice from receiving this medicine? Side effects that you should report to your doctor or  health care professional as soon as possible:  allergic reactions like skin rash, itching or hives, swelling of the face, lips, or tongue  anxious  breathing problems  changes in emotions or moods  changes in vision  chest pain or chest tightness  fast, irregular heartbeat  fingers or toes feel numb, cool, painful  hallucination, loss of contact with reality  high blood pressure  males: prolonged or painful erection  seizures  signs and symptoms of serotonin syndrome like confusion, increased sweating, fever, tremor, stiff muscles, diarrhea  signs and symptoms of a stroke like changes in vision; confusion; trouble speaking or understanding; severe headaches; sudden numbness or weakness of the face, arm or leg; trouble walking; dizziness; loss of balance or coordination  suicidal thoughts or other mood changes  uncontrollable head, mouth, neck, arm, or leg movements Side effects that usually do not require medical attention (report to your doctor or health care professional if they continue or are bothersome):  dry mouth  headache  irritability  loss of appetite  nausea  trouble sleeping  weight loss This list may not describe all possible side effects. Call your doctor for medical advice about side effects. You may report side effects to FDA at 1-800-FDA-1088. Where should I keep my medicine? Keep out of the reach of children. This medicine can be abused. Keep your medicine in a safe place to   protect it from theft. Do not share this medicine with anyone. Selling or giving away this medicine is dangerous and against the law. Store at room temperature between 15 and 30 degrees C (59 and 86 degrees F). Keep container tightly closed. Protect from light. Throw away any unused medicine after the expiration date. NOTE: This sheet is a summary. It may not cover all possible information. If you have questions about this medicine, talk to your doctor, pharmacist, or health  care provider.  2020 Elsevier/Gold Standard (2016-02-26 13:37:27)  

## 2019-10-07 ENCOUNTER — Telehealth: Payer: Self-pay

## 2019-10-07 NOTE — Telephone Encounter (Signed)
faxed and confirmed copy of labwork sent to pcp

## 2019-10-12 ENCOUNTER — Telehealth: Payer: Self-pay

## 2019-10-12 DIAGNOSIS — F900 Attention-deficit hyperactivity disorder, predominantly inattentive type: Secondary | ICD-10-CM

## 2019-10-12 MED ORDER — AMPHETAMINE-DEXTROAMPHET ER 20 MG PO CP24: 20.0000 mg | ORAL_CAPSULE | Freq: Every day | ORAL | 0 refills | Status: DC

## 2019-10-12 NOTE — Telephone Encounter (Signed)
Medication management - Telephone message from patient stating she wanted to let Dr. Elna Breslow know the new Adderall XR 20 mg was "working really well" and would like a full prescription of this sent to her pharmacy. Patient also wanted to question Dr. Elna Breslow if some recent increased muscle aches could be due to the Gabapentin she is now taking as well, stating this started after she began taking it.  Questions if this could be related or if she needs to follow up with her MD who treats her fibromyalgia as this has not flared up in some time.

## 2019-10-12 NOTE — Telephone Encounter (Signed)
I have sent Adderal to pharmacy per request. Patient needs to call her provider who is treating her fibromyalgia . Not sure if this is related to medication. She will have to monitor self and also monitor for serotonin syndrome which can happen when she is on multiple medications. However she could start by checking if this is a flare up of fibromyalgia.

## 2019-10-12 NOTE — Telephone Encounter (Signed)
Medication management - Telephone message left for pt that Dr Elna Breslow had sent in a new Adderall XR order for her to her pharmacy and informed her that she may want to check with her MD that treats her fibromyalgia to question gabapentin medication and possible flare up of illness or if could be related to one of her current medications.  Patient to call back if any questions.

## 2019-10-23 ENCOUNTER — Telehealth: Admitting: Psychiatry

## 2019-10-27 ENCOUNTER — Telehealth: Payer: Self-pay

## 2019-10-27 DIAGNOSIS — F900 Attention-deficit hyperactivity disorder, predominantly inattentive type: Secondary | ICD-10-CM

## 2019-10-27 MED ORDER — METHYLPHENIDATE HCL ER (OSM) 27 MG PO TBCR: 27.0000 mg | EXTENDED_RELEASE_TABLET | ORAL | 0 refills | Status: DC

## 2019-10-27 NOTE — Telephone Encounter (Signed)
pt called left a message that she needs to change the adderaal xr.  she states that around 4-6 she starts getting irriitable

## 2019-10-27 NOTE — Telephone Encounter (Signed)
Returned call to patient.  Discussed stopping Adderall since she reports irritability at the end of the day.  We will start Concerta 27 mg p.o. daily.  She will monitor her symptoms and let writer know.

## 2019-11-03 ENCOUNTER — Other Ambulatory Visit: Payer: Self-pay

## 2019-11-03 ENCOUNTER — Inpatient Hospital Stay: Attending: Oncology

## 2019-11-03 ENCOUNTER — Telehealth: Payer: Self-pay

## 2019-11-03 DIAGNOSIS — D509 Iron deficiency anemia, unspecified: Secondary | ICD-10-CM | POA: Diagnosis not present

## 2019-11-03 DIAGNOSIS — F900 Attention-deficit hyperactivity disorder, predominantly inattentive type: Secondary | ICD-10-CM

## 2019-11-03 LAB — CBC WITH DIFFERENTIAL/PLATELET
Abs Immature Granulocytes: 0.01 10*3/uL (ref 0.00–0.07)
Basophils Absolute: 0 10*3/uL (ref 0.0–0.1)
Basophils Relative: 1 %
Eosinophils Absolute: 0.1 10*3/uL (ref 0.0–0.5)
Eosinophils Relative: 2 %
HCT: 40.7 % (ref 36.0–46.0)
Hemoglobin: 13.8 g/dL (ref 12.0–15.0)
Immature Granulocytes: 0 %
Lymphocytes Relative: 24 %
Lymphs Abs: 1.4 10*3/uL (ref 0.7–4.0)
MCH: 31 pg (ref 26.0–34.0)
MCHC: 33.9 g/dL (ref 30.0–36.0)
MCV: 91.5 fL (ref 80.0–100.0)
Monocytes Absolute: 0.4 10*3/uL (ref 0.1–1.0)
Monocytes Relative: 8 %
Neutro Abs: 3.9 10*3/uL (ref 1.7–7.7)
Neutrophils Relative %: 65 %
Platelets: 266 10*3/uL (ref 150–400)
RBC: 4.45 MIL/uL (ref 3.87–5.11)
RDW: 12.6 % (ref 11.5–15.5)
WBC: 5.9 10*3/uL (ref 4.0–10.5)
nRBC: 0 % (ref 0.0–0.2)

## 2019-11-03 LAB — IRON AND TIBC
Iron: 53 ug/dL (ref 28–170)
Saturation Ratios: 17 % (ref 10.4–31.8)
TIBC: 312 ug/dL (ref 250–450)
UIBC: 259 ug/dL

## 2019-11-03 LAB — FERRITIN: Ferritin: 75 ng/mL (ref 11–307)

## 2019-11-03 MED ORDER — METHYLPHENIDATE HCL ER (OSM) 36 MG PO TBCR: 36.0000 mg | EXTENDED_RELEASE_TABLET | Freq: Every day | ORAL | 0 refills | Status: DC

## 2019-11-03 NOTE — Telephone Encounter (Signed)
pt called left message that she is not telling any diffenece in takin the concerta. can she try something else.

## 2019-11-03 NOTE — Telephone Encounter (Signed)
Returned call to patient. She reports the Concerta 27 mg was not beneficial at all. Will increase to 36 mg. She will reach out if she continues to have problems.

## 2019-11-11 ENCOUNTER — Other Ambulatory Visit: Payer: Self-pay

## 2019-11-11 ENCOUNTER — Encounter: Payer: Self-pay | Admitting: Psychiatry

## 2019-11-11 ENCOUNTER — Telehealth (INDEPENDENT_AMBULATORY_CARE_PROVIDER_SITE_OTHER): Admitting: Psychiatry

## 2019-11-11 DIAGNOSIS — F900 Attention-deficit hyperactivity disorder, predominantly inattentive type: Secondary | ICD-10-CM

## 2019-11-11 DIAGNOSIS — F411 Generalized anxiety disorder: Secondary | ICD-10-CM

## 2019-11-11 DIAGNOSIS — F5105 Insomnia due to other mental disorder: Secondary | ICD-10-CM | POA: Diagnosis not present

## 2019-11-11 DIAGNOSIS — F3176 Bipolar disorder, in full remission, most recent episode depressed: Secondary | ICD-10-CM | POA: Diagnosis not present

## 2019-11-11 MED ORDER — DEXTROAMPHETAMINE SULFATE ER 10 MG PO CP24: 10.0000 mg | ORAL_CAPSULE | Freq: Every day | ORAL | 0 refills | Status: DC

## 2019-11-11 NOTE — Progress Notes (Signed)
Virtual Visit via Telephone Note  I connected with Sierra [name] on 11/11/19 at  3:20 PM EDT by telephone and verified that I am speaking with the correct person using two identifiers.  Location Provider Location : [facility] Patient Location : Home  Participants: Patient , Provider    I discussed the limitations, risks, security and privacy concerns of performing an evaluation and management service by telephone and the availability of in person appointments. I also discussed with the patient that there may be a patient responsible charge related to this service. The patient expressed understanding and agreed to proceed.     I discussed the assessment and treatment plan with the patient. The patient was provided an opportunity to ask questions and all were answered. The patient agreed with the plan and demonstrated an understanding of the instructions.   The patient was advised to call back or seek an in-person evaluation if the symptoms worsen or if the condition fails to improve as anticipated.   BH MD OP Progress Note  11/11/2019 3:39 PM Sierra [name]  MRN:  [id number]  Chief Complaint:  Chief Complaint    Follow-up     HPI: Sierra [name] is a 39 year old Caucasian female, married, lives in [place], has a history of bipolar disorder, GAD, binge eating disorder, ADHD was evaluated by phone today.  Patient today reports she is currently struggling with attention and focus problems.  She reports the Concerta increased dosage does not seem to help at all.  She reports she is having a lot of trouble getting homework done at school.  Patient otherwise denies any mood lability.  She is compliant on her medications.  Denies side effects.  She reports sleep is good.  She reports appetite is fair.  Patient denies any suicidality, homicidality or perceptual disturbances.  Patient denies any other concerns today.  Visit Diagnosis:    ICD-10-CM   1. Bipolar disorder, in  full remission, most recent episode depressed (HCC)  F31.76   2. GAD (generalized anxiety disorder)  F41.1   3. Attention deficit hyperactivity disorder (ADHD), predominantly inattentive type  F90.0 dextroamphetamine (DEXEDRINE) 10 MG 24 hr capsule  4. Insomnia due to mental condition  F51.05     Past Psychiatric History: I have reviewed past psychiatric history from my progress note on 06/20/2017.  Past Medical History:  Past Medical History:  Diagnosis Date  . Abdominal pain, other specified site   . ADHD   . 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 Anemia   . Anxiety state, unspecified   . Bipolar 1 disorder, depressed (HCC) 05/21/2017  . BIPOLAR AFFECTIVE DISORDER 12/23/2006   Qualifier: Diagnosis of  By: Ermalene SearingBedsole MD, Amy    . Bipolar affective disorder (HCC) 10/12/2011  . Bipolar disorder, unspecified (HCC)   . Depressive disorder, not elsewhere classified   . Edema   . Esophageal reflux   . Fibromyalgia   . Headache   . History of kidney stones   . Mild or unspecified pre-eclampsia, unspecified as to episode of care   . Morbid obesity (HCC)   . Myalgia and myositis, unspecified   . Other dyspnea and respiratory abnormality   . Preeclampsia    with first pregnancy  . Raynaud's disease   . Urinary tract infection, site not specified     Past Surgical History:  Procedure Laterality Date  . CESAREAN SECTION     X 2  . CHOLECYSTECTOMY  03/2006  . CYSTOSCOPY W/ URETERAL STENT PLACEMENT  Left 05/14/2016   Procedure: CYSTOSCOPY WITH STENT REPLACEMENT;  Surgeon: [name], MD;  Location: [facility];  Service: Urology;  Laterality: Left;  . CYSTOSCOPY WITH STENT PLACEMENT Left 04/25/2016   Procedure: CYSTOSCOPY WITH STENT PLACEMENT;  Surgeon: [name], MD;  Location: [facility];  Service: Urology;  Laterality: Left;  . DILATION AND CURETTAGE OF UTERUS    . IVC FILTER INSERTION  03/26/2016   [facility]  . LAPAROSCOPIC GASTRIC RESTRICTIVE DUODENAL PROCEDURE (DUODENAL  SWITCH)  03/2016  . LITHOTRIPSY    . TONSILLECTOMY AND ADENOIDECTOMY    . URETEROSCOPY WITH HOLMIUM LASER LITHOTRIPSY Left 05/14/2016   Procedure: URETEROSCOPY WITH HOLMIUM LASER LITHOTRIPSY;  Surgeon: [name], MD;  Location: [facility];  Service: Urology;  Laterality: Left;    Family Psychiatric History: I have reviewed family psychiatric history from my progress note on 06/20/2017.  Family History:  Family History  Problem Relation Age of Onset  . Depression Father   . Alcohol abuse Father   . Depression Mother   . Hyperlipidemia Mother   . Sleep apnea Mother   . Depression Sister   . Hyperlipidemia Brother   . Depression Brother   . Hyperlipidemia Brother   . Depression Brother   . Alcohol abuse Brother   . Coronary artery disease Maternal Grandmother   . Heart attack Maternal Grandmother   . Diabetes Maternal Grandmother   . Lung cancer Maternal Grandfather   . Emphysema Maternal Grandfather   . Coronary artery disease Maternal Grandfather   . Lupus Other        Aunt  . Fibromyalgia Other        Aunt    Social History: Reviewed social history from my progress note on 06/20/2017. Social History   Socioeconomic History  . Marital status: Married    Spouse name: brian  . Number of children: 2  . Years of education: Not on file  . Highest education level: Some college, no degree  Occupational History  . Occupation: Home maker  Tobacco Use  . Smoking status: Never Smoker  . Smokeless tobacco: Never Used  Vaping Use  . Vaping Use: Never used  Substance and Sexual Activity  . Alcohol use: Yes    Alcohol/week: 2.0 - 3.0 standard drinks    Types: 2 Glasses of wine per week  . Drug use: No  . Sexual activity: Not Currently    Partners: Male    Birth control/protection: None, I.U.D.  Other Topics Concern  . Not on file  Social History Narrative   1 child, 2 step-sons      Regular exercise-no   Diet: no fast food, likes sweets   Social Determinants of Health    Financial Resource Strain:   . Difficulty of Paying Living Expenses: Not on file  Food Insecurity:   . Worried About Programme researcher, broadcasting/film/video in the Last Year: Not on file  . Ran Out of Food in the Last Year: Not on file  Transportation Needs:   . Lack of Transportation (Medical): Not on file  . Lack of Transportation (Non-Medical): Not on file  Physical Activity:   . Days of Exercise per Week: Not on file  . Minutes of Exercise per Session: Not on file  Stress:   . Feeling of Stress : Not on file  Social Connections:   . Frequency of Communication with Friends and Family: Not on file  . Frequency of Social Gatherings with Friends and Family: Not on file  .  Attends Religious Services: Not on file  . Active Member of Clubs or Organizations: Not on file  . Attends Banker Meetings: Not on file  . Marital Status: Not on file    Allergies:  Allergies  Allergen Reactions  . Uncaria Tomentosa (Cats Claw) Other (See Comments)    allgeric to cats in general causes sneezing  . Bee Pollen Itching  . Molds & Smuts Other (See Comments)  . Pollen Extract   . Milk-Related Compounds     Able to tolerate yogurt; mild intolerance to milk/cheese  . Pineapple Rash  . Soy Allergy Diarrhea  . Strawberry Extract Rash    Break out on tongue noted Break out on tongue noted Break out on tongue noted    Metabolic Disorder Labs: Lab Results  Component Value Date   HGBA1C 4.7 (L) 05/22/2017   MPG 88.19 05/22/2017   Lab Results  Component Value Date   PROLACTIN 7.6 11/07/2018   Lab Results  Component Value Date   CHOL 105 05/22/2017   TRIG 41 05/22/2017   HDL 48 05/22/2017   CHOLHDL 2.2 05/22/2017   VLDL 8 05/22/2017   LDLCALC 49 05/22/2017   LDLCALC 99 09/11/2013   Lab Results  Component Value Date   TSH 0.504 05/21/2018   TSH 0.677 05/22/2017    Therapeutic Level Labs: Lab Results  Component Value Date   LITHIUM 0.18 (L) 08/13/2019   LITHIUM 0.1 (L) 11/07/2018    No results found for: VALPROATE No components found for:  CBMZ  Current Medications: Current Outpatient Medications  Medication Sig Dispense Refill  . cyclobenzaprine (FLEXERIL) 10 MG tablet Take 10 mg by mouth 2 (two) times daily as needed for muscle spasms.    . Milnacipran (SAVELLA) 50 MG TABS tablet Take 50 mg by mouth 2 (two) times daily. Takes differently    . ARIPiprazole (ABILIFY) 30 MG tablet Take 1 tablet (30 mg total) by mouth daily. 90 tablet 0  . Biotin 5000 MCG TABS Take 1 tablet by mouth daily.    Marland Kitchen buPROPion (WELLBUTRIN XL) 150 MG 24 hr tablet Take 1 tablet (150 mg total) by mouth daily. 90 tablet 0  . calcium citrate-vitamin D 500-400 MG-UNIT chewable tablet Chew 1 tablet by mouth daily.     . ciclopirox (PENLAC) 8 % solution Apply topically at bedtime.    . clonazePAM (KLONOPIN) 0.5 MG tablet Take 1 tablet (0.5 mg total) by mouth as directed. Take once every few days for severe panic attacks only. 10 tablet 0  . dextroamphetamine (DEXEDRINE) 10 MG 24 hr capsule Take 1 capsule (10 mg total) by mouth daily. 8 capsule 0  . docusate sodium (COLACE) 100 MG capsule Take 300 mg by mouth daily.    . ferrous sulfate 325 (65 FE) MG tablet Take 325 mg by mouth daily with breakfast.    . gabapentin (NEURONTIN) 100 MG capsule Take 1 capsule (100 mg total) by mouth 3 (three) times daily. 90 capsule 1  . hydrochlorothiazide (HYDRODIURIL) 25 MG tablet Take 25 mg by mouth daily as needed.    . hydrOXYzine (ATARAX/VISTARIL) 25 MG tablet TAKE 1 TABLET (25 MG TOTAL) BY MOUTH DAILY AS NEEDED FOR ANXIETY. 90 tablet 1  . lamoTRIgine (LAMICTAL) 100 MG tablet Take 1 tablet (100 mg total) by mouth daily. 90 tablet 0  . lamoTRIgine (LAMICTAL) 25 MG tablet Take 2 tablets (50 mg total) by mouth daily. To be added to 100 mg daily 180 tablet 0  . levonorgestrel (  MIRENA) 20 MCG/24HR IUD 1 each by Intrauterine route once.    . lithium carbonate (LITHOBID) 300 MG CR tablet TAKE 1 TABLET BY MOUTH EVERY DAY  WITH SUPPER 90 tablet 0  . montelukast (SINGULAIR) 10 MG tablet Take 10 mg by mouth daily.    . Multiple Vitamin (MULTIVITAMIN) tablet Take 1 tablet by mouth daily.    Marland Kitchen NIFEdipine (PROCARDIA-XL/ADALAT CC) 60 MG 24 hr tablet Take 60 mg by mouth daily.     . Norethindrone Acetate-Ethinyl Estrad-FE (LOESTRIN 24 FE) 1-20 MG-MCG(24) tablet Take 1 tablet by mouth daily. 3 Package 4  . omeprazole (PRILOSEC) 20 MG capsule Take 20 mg by mouth daily.    . predniSONE (DELTASONE) 10 MG tablet Take 10 mg by mouth 2 (two) times daily. (Patient not taking: Reported on 11/11/2019)    . tamsulosin (FLOMAX) 0.4 MG CAPS capsule Take 0.4 mg by mouth daily.    . zaleplon (SONATA) 10 MG capsule Take 1 capsule (10 mg total) by mouth at bedtime as needed. 30 capsule 2   No current facility-administered medications for this visit.     Musculoskeletal: Strength & Muscle Tone: UTA Gait & Station: UTA Patient leans: N/A  Psychiatric Specialty Exam: Review of Systems  Psychiatric/Behavioral: Positive for decreased concentration.  All other systems reviewed and are negative.   There were no vitals taken for this visit.There is no height or weight on file to calculate BMI.  General Appearance: UTA  Eye Contact:  UTA  Speech:  Clear and Coherent  Volume:  Normal  Mood:  Euthymic  Affect:  UTA  Thought Process:  Goal Directed and Descriptions of Associations: Intact  Orientation:  Full (Time, Place, and Person)  Thought Content: Logical   Suicidal Thoughts:  No  Homicidal Thoughts:  No  Memory:  Immediate;   Fair Recent;   Fair Remote;   Fair  Judgement:  Fair  Insight:  Fair  Psychomotor Activity:  UTA  Concentration:  Concentration: Fair and Attention Span: Fair  Recall:  [employer] of Knowledge: Fair  Language: Fair  Akathisia:  No  Handed:  Right  AIMS (if indicated): UTA  Assets:  Communication Skills Desire for Improvement Housing Social Support  ADL's:  Intact  Cognition: WNL  Sleep:   Fair   Screenings: AIMS     Office Visit from 12/11/2017 in [facility] Office Visit from 11/29/2017 in [facility] Office Visit from 11/20/2017 in [facility] Admission (Discharged) from 05/21/2017 in [facility]  AIMS Total Score 1 7 5  0    AUDIT     Admission (Discharged) from 05/21/2017 in [facility]INE  Alcohol Use Disorder Identification Test Final Score (AUDIT) 6       Assessment and Plan: RAHCEL SHUTES is a 39 year old Caucasian female who has a history of bipolar disorder, anxiety disorder, binge eating disorder was evaluated by phone today.  Patient is currently struggling with ADHD symptoms and will continue to need medication readjustment since her Concerta is not beneficial.  Plan as noted below.  Plan Bipolar disorder in remission Lamictal 150 mg p.o. daily Lithium 300 mg p.o. daily at reduced dosage. Lithium level reviewed and discussed with patient-dated 08/13/2019-0.18. Abilify 30 mg p.o. daily Wellbutrin XL 150 mg p.o. daily. If patient continues to remain stable she can be tapered off of Abilify or Lamictal. Continue CBT as needed.  Anxiety disorder-stable Gabapentin 100 mg p.o. 3 times daily Hydroxyzine as  needed Continue CBT  ADHD inattentive type-unstable Discontinue Concerta for lack of benefit. Start Dextrostat 10 mg p.o. daily. I have reviewed [place] controlled substance database.  Follow-up in clinic in 4 weeks or sooner if needed.  I have spent atleast 20 minutes non face to face  with patient today. More than 50 % of the time was spent for preparing to see the patient ( e.g., review of test, records ), ordering medications and test ,psychoeducation and supportive psychotherapy and care coordination,as well as documenting clinical information in electronic health record. This note was generated in part or whole with voice  recognition software. Voice recognition is usually quite accurate but there are transcription errors that can and very often do occur. I apologize for any typographical errors that were not detected and corrected.        Jomarie Longs, MD 11/12/2019, 8:18 AM

## 2019-11-20 ENCOUNTER — Telehealth: Payer: Self-pay

## 2019-11-20 DIAGNOSIS — F900 Attention-deficit hyperactivity disorder, predominantly inattentive type: Secondary | ICD-10-CM

## 2019-11-20 MED ORDER — DEXTROAMPHETAMINE SULFATE ER 10 MG PO CP24: 20.0000 mg | ORAL_CAPSULE | Freq: Every day | ORAL | 0 refills | Status: DC

## 2019-11-20 NOTE — Telephone Encounter (Signed)
Returned call to patient.  She reports the Dexedrine works well however the effect wears off at the end of the day.  She denies side effects. Increase Dexedrine to 20 mg p.o. daily.

## 2019-11-20 NOTE — Telephone Encounter (Signed)
pt called left message that she feels she needs to increase the dextroamphetamine it like it wears off by the afternoon.

## 2019-12-09 ENCOUNTER — Encounter: Payer: Self-pay | Admitting: Psychiatry

## 2019-12-09 ENCOUNTER — Telehealth (INDEPENDENT_AMBULATORY_CARE_PROVIDER_SITE_OTHER): Admitting: Psychiatry

## 2019-12-09 ENCOUNTER — Telehealth: Payer: Self-pay

## 2019-12-09 ENCOUNTER — Other Ambulatory Visit: Payer: Self-pay

## 2019-12-09 DIAGNOSIS — F5105 Insomnia due to other mental disorder: Secondary | ICD-10-CM

## 2019-12-09 DIAGNOSIS — Z79899 Other long term (current) drug therapy: Secondary | ICD-10-CM

## 2019-12-09 DIAGNOSIS — F3176 Bipolar disorder, in full remission, most recent episode depressed: Secondary | ICD-10-CM | POA: Diagnosis not present

## 2019-12-09 DIAGNOSIS — F900 Attention-deficit hyperactivity disorder, predominantly inattentive type: Secondary | ICD-10-CM

## 2019-12-09 DIAGNOSIS — F411 Generalized anxiety disorder: Secondary | ICD-10-CM | POA: Diagnosis not present

## 2019-12-09 MED ORDER — LAMOTRIGINE 100 MG PO TABS: 100.0000 mg | ORAL_TABLET | Freq: Every day | ORAL | 0 refills | Status: DC

## 2019-12-09 MED ORDER — LAMOTRIGINE 25 MG PO TABS: 50.0000 mg | ORAL_TABLET | Freq: Every day | ORAL | 0 refills | Status: DC

## 2019-12-09 MED ORDER — BUPROPION HCL ER (XL) 150 MG PO TB24: 150.0000 mg | ORAL_TABLET | Freq: Every day | ORAL | 0 refills | Status: DC

## 2019-12-09 MED ORDER — GABAPENTIN 100 MG PO CAPS: 100.0000 mg | ORAL_CAPSULE | Freq: Four times a day (QID) | ORAL | 1 refills | Status: DC

## 2019-12-09 MED ORDER — LITHIUM CARBONATE ER 300 MG PO TBCR: EXTENDED_RELEASE_TABLET | ORAL | 0 refills | Status: DC

## 2019-12-09 MED ORDER — DEXTROAMPHETAMINE SULFATE ER 10 MG PO CP24: 20.0000 mg | ORAL_CAPSULE | Freq: Every day | ORAL | 0 refills | Status: DC

## 2019-12-09 MED ORDER — ARIPIPRAZOLE 30 MG PO TABS: 30.0000 mg | ORAL_TABLET | Freq: Every day | ORAL | 0 refills | Status: DC

## 2019-12-09 NOTE — Telephone Encounter (Signed)
faxed and confirmed labwork orders for lithium levels, bun + creatin dx:[phone]

## 2019-12-09 NOTE — Progress Notes (Signed)
Virtual Visit via Telephone Note  I connected with Sierra [name] on 12/09/19 at  3:30 PM EST by telephone and verified that I am speaking with the correct person using two identifiers.  Location Provider Location : [facility] Patient Location : Home  Participants: Patient , Provider    I discussed the limitations, risks, security and privacy concerns of performing an evaluation and management service by telephone and the availability of in person appointments. I also discussed with the patient that there may be a patient responsible charge related to this service. The patient expressed understanding and agreed to proceed.   I discussed the assessment and treatment plan with the patient. The patient was provided an opportunity to ask questions and all were answered. The patient agreed with the plan and demonstrated an understanding of the instructions.   The patient was advised to call back or seek an in-person evaluation if the symptoms worsen or if the condition fails to improve as anticipated.   BH MD OP Progress Note  12/09/2019 6:14 PM Sierra [name]  MRN:  [id number]  Chief Complaint:  Chief Complaint    Follow-up     HPI: Sierra [name] is a 39 year old Caucasian female, married, lives in [place], has a history of bipolar disorder, GAD, binge eating disorder, ADHD was evaluated by phone today.  Patient today reports she is currently doing well with regards to her attention and focus.  She likes the effect of Dexedrine.  It is helpful at this dosage.  She denies side effects.  She reports it was a stressful week for her since her mother-in-law was here to visit.  She reports she also had some problem with her heating and air conditioning.  She also has the stress of being in school and having her exams coming up.  She however is planning to take a break for the next few days to relax and spend Thanksgiving with her family.  She is currently compliant on all her  medications.  Denies side effects.  She reports sleep and appetite as fair.  Patient denies any suicidality, homicidality or perceptual disturbances.  Patient denies any other concerns today.  Visit Diagnosis:    ICD-10-CM   1. Bipolar disorder, in full remission, most recent episode depressed (HCC)  F31.76 lithium carbonate (LITHOBID) 300 MG CR tablet    lamoTRIgine (LAMICTAL) 100 MG tablet    buPROPion (WELLBUTRIN XL) 150 MG 24 hr tablet    ARIPiprazole (ABILIFY) 30 MG tablet  2. GAD (generalized anxiety disorder)  F41.1 lithium carbonate (LITHOBID) 300 MG CR tablet    lamoTRIgine (LAMICTAL) 25 MG tablet    lamoTRIgine (LAMICTAL) 100 MG tablet    gabapentin (NEURONTIN) 100 MG capsule    ARIPiprazole (ABILIFY) 30 MG tablet  3. Attention deficit hyperactivity disorder (ADHD), predominantly inattentive type  F90.0 dextroamphetamine (DEXEDRINE) 10 MG 24 hr capsule    dextroamphetamine (DEXEDRINE SPANSULE) 10 MG 24 hr capsule  4. Insomnia due to mental condition  F51.05 lamoTRIgine (LAMICTAL) 25 MG tablet  5. High risk medication use  Z79.899 Lithium level    BUN+Creat    Past Psychiatric History: I have reviewed past psychiatric history from my progress note on 06/20/2017  Past Medical History:  Past Medical History:  Diagnosis Date  . Abdominal pain, other specified site   . ADHD   . 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 Anemia   . Anxiety state, unspecified   . Bipolar 1 disorder, depressed (HCC) 05/21/2017  .  BIPOLAR AFFECTIVE DISORDER 12/23/2006   Qualifier: Diagnosis of  By: Ermalene SearingBedsole MD, Amy    . Bipolar affective disorder (HCC) 10/12/2011  . Bipolar disorder, unspecified (HCC)   . Depressive disorder, not elsewhere classified   . Edema   . Esophageal reflux   . Fibromyalgia   . Headache   . History of kidney stones   . Mild or unspecified pre-eclampsia, unspecified as to episode of care   . Morbid obesity (HCC)   . Myalgia and myositis, unspecified   . Other  dyspnea and respiratory abnormality   . Preeclampsia    with first pregnancy  . Raynaud's disease   . Urinary tract infection, site not specified     Past Surgical History:  Procedure Laterality Date  . CESAREAN SECTION     X 2  . CHOLECYSTECTOMY  03/2006  . CYSTOSCOPY W/ URETERAL STENT PLACEMENT Left 05/14/2016   Procedure: CYSTOSCOPY WITH STENT REPLACEMENT;  Surgeon: Vanna ScotlandAshley Brandon, MD;  Location: [facility];  Service: Urology;  Laterality: Left;  . CYSTOSCOPY WITH STENT PLACEMENT Left 04/25/2016   Procedure: CYSTOSCOPY WITH STENT PLACEMENT;  Surgeon: Vanna ScotlandAshley Brandon, MD;  Location: [facility];  Service: Urology;  Laterality: Left;  . DILATION AND CURETTAGE OF UTERUS    . IVC FILTER INSERTION  03/26/2016   [facility]  . LAPAROSCOPIC GASTRIC RESTRICTIVE DUODENAL PROCEDURE (DUODENAL SWITCH)  03/2016  . LITHOTRIPSY    . TONSILLECTOMY AND ADENOIDECTOMY    . URETEROSCOPY WITH HOLMIUM LASER LITHOTRIPSY Left 05/14/2016   Procedure: URETEROSCOPY WITH HOLMIUM LASER LITHOTRIPSY;  Surgeon: Vanna ScotlandAshley Brandon, MD;  Location: [facility];  Service: Urology;  Laterality: Left;    Family Psychiatric History: I have reviewed past psychiatric history from my progress note on 06/20/2017  Family History:  Family History  Problem Relation Age of Onset  . Depression Father   . Alcohol abuse Father   . Depression Mother   . Hyperlipidemia Mother   . Sleep apnea Mother   . Depression Sister   . Hyperlipidemia Brother   . Depression Brother   . Hyperlipidemia Brother   . Depression Brother   . Alcohol abuse Brother   . Coronary artery disease Maternal Grandmother   . Heart attack Maternal Grandmother   . Diabetes Maternal Grandmother   . Lung cancer Maternal Grandfather   . Emphysema Maternal Grandfather   . Coronary artery disease Maternal Grandfather   . Lupus Other        Aunt  . Fibromyalgia Other        Aunt    Social History: Reviewed social history from my progress note on 06/20/2017 Social  History   Socioeconomic History  . Marital status: Married    Spouse name: brian  . Number of children: 2  . Years of education: Not on file  . Highest education level: Some college, no degree  Occupational History  . Occupation: Home maker  Tobacco Use  . Smoking status: Never Smoker  . Smokeless tobacco: Never Used  Vaping Use  . Vaping Use: Never used  Substance and Sexual Activity  . Alcohol use: Yes    Alcohol/week: 2.0 - 3.0 standard drinks    Types: 2 Glasses of wine per week  . Drug use: No  . Sexual activity: Not Currently    Partners: Male    Birth control/protection: None, I.U.D.  Other Topics Concern  . Not on file  Social History Narrative   1 child, 2 step-sons      Regular exercise-no  Diet: no fast food, likes sweets   Social Determinants of Health   Financial Resource Strain:   . Difficulty of Paying Living Expenses: Not on file  Food Insecurity:   . Worried About Programme researcher, broadcasting/film/video in the Last Year: Not on file  . Ran Out of Food in the Last Year: Not on file  Transportation Needs:   . Lack of Transportation (Medical): Not on file  . Lack of Transportation (Non-Medical): Not on file  Physical Activity:   . Days of Exercise per Week: Not on file  . Minutes of Exercise per Session: Not on file  Stress:   . Feeling of Stress : Not on file  Social Connections:   . Frequency of Communication with Friends and Family: Not on file  . Frequency of Social Gatherings with Friends and Family: Not on file  . Attends Religious Services: Not on file  . Active Member of Clubs or Organizations: Not on file  . Attends Banker Meetings: Not on file  . Marital Status: Not on file    Allergies:  Allergies  Allergen Reactions  . Uncaria Tomentosa (Cats Claw) Other (See Comments)    allgeric to cats in general causes sneezing  . Bee Pollen Itching  . Molds & Smuts Other (See Comments)  . Pollen Extract   . Milk-Related Compounds     Able to  tolerate yogurt; mild intolerance to milk/cheese  . Pineapple Rash  . Soy Allergy Diarrhea  . Strawberry Extract Rash    Break out on tongue noted Break out on tongue noted Break out on tongue noted    Metabolic Disorder Labs: Lab Results  Component Value Date   HGBA1C 4.7 (L) 05/22/2017   MPG 88.19 05/22/2017   Lab Results  Component Value Date   PROLACTIN 7.6 11/07/2018   Lab Results  Component Value Date   CHOL 105 05/22/2017   TRIG 41 05/22/2017   HDL 48 05/22/2017   CHOLHDL 2.2 05/22/2017   VLDL 8 05/22/2017   LDLCALC 49 05/22/2017   LDLCALC 99 09/11/2013   Lab Results  Component Value Date   TSH 0.504 05/21/2018   TSH 0.677 05/22/2017    Therapeutic Level Labs: Lab Results  Component Value Date   LITHIUM 0.18 (L) 08/13/2019   LITHIUM 0.1 (L) 11/07/2018   No results found for: VALPROATE No components found for:  CBMZ  Current Medications: Current Outpatient Medications  Medication Sig Dispense Refill  . ARIPiprazole (ABILIFY) 30 MG tablet Take 1 tablet (30 mg total) by mouth daily. 90 tablet 0  . Biotin 5000 MCG TABS Take 1 tablet by mouth daily.    Marland Kitchen buPROPion (WELLBUTRIN XL) 150 MG 24 hr tablet Take 1 tablet (150 mg total) by mouth daily. 90 tablet 0  . calcium citrate-vitamin D 500-400 MG-UNIT chewable tablet Chew 1 tablet by mouth daily.     . ciclopirox (PENLAC) 8 % solution Apply topically at bedtime.    . clonazePAM (KLONOPIN) 0.5 MG tablet Take 1 tablet (0.5 mg total) by mouth as directed. Take once every few days for severe panic attacks only. 10 tablet 0  . cyclobenzaprine (FLEXERIL) 10 MG tablet Take 10 mg by mouth 2 (two) times daily as needed for muscle spasms.    Melene Muller ON 01/07/2020] dextroamphetamine (DEXEDRINE SPANSULE) 10 MG 24 hr capsule Take 2 capsules (20 mg total) by mouth daily. 60 capsule 0  . dextroamphetamine (DEXEDRINE) 10 MG 24 hr capsule Take 2 capsules (20  mg total) by mouth daily. 60 capsule 0  . docusate sodium (COLACE)  100 MG capsule Take 300 mg by mouth daily.    . ferrous sulfate 325 (65 FE) MG tablet Take 325 mg by mouth daily with breakfast.    . gabapentin (NEURONTIN) 100 MG capsule Take 1 capsule (100 mg total) by mouth in the morning, at noon, in the evening, and at bedtime. 360 capsule 1  . hydrochlorothiazide (HYDRODIURIL) 25 MG tablet Take 25 mg by mouth daily as needed.    . hydrOXYzine (ATARAX/VISTARIL) 25 MG tablet TAKE 1 TABLET (25 MG TOTAL) BY MOUTH DAILY AS NEEDED FOR ANXIETY. 90 tablet 1  . lamoTRIgine (LAMICTAL) 100 MG tablet Take 1 tablet (100 mg total) by mouth daily. 90 tablet 0  . lamoTRIgine (LAMICTAL) 25 MG tablet Take 2 tablets (50 mg total) by mouth daily. To be added to 100 mg daily 180 tablet 0  . levonorgestrel (MIRENA) 20 MCG/24HR IUD 1 each by Intrauterine route once.    . lithium carbonate (LITHOBID) 300 MG CR tablet TAKE 1 TABLET BY MOUTH EVERY DAY WITH SUPPER 90 tablet 0  . Milnacipran (SAVELLA) 50 MG TABS tablet Take 50 mg by mouth 2 (two) times daily. Takes differently    . montelukast (SINGULAIR) 10 MG tablet Take 10 mg by mouth daily.    . Multiple Vitamin (MULTIVITAMIN) tablet Take 1 tablet by mouth daily.    Marland Kitchen NIFEdipine (PROCARDIA-XL/ADALAT CC) 60 MG 24 hr tablet Take 60 mg by mouth daily.     . Norethindrone Acetate-Ethinyl Estrad-FE (LOESTRIN 24 FE) 1-20 MG-MCG(24) tablet Take 1 tablet by mouth daily. 3 Package 4  . omeprazole (PRILOSEC) 20 MG capsule Take 20 mg by mouth daily.    . predniSONE (DELTASONE) 10 MG tablet Take 10 mg by mouth 2 (two) times daily. (Patient not taking: Reported on 11/11/2019)    . tamsulosin (FLOMAX) 0.4 MG CAPS capsule Take 0.4 mg by mouth daily.    . zaleplon (SONATA) 10 MG capsule Take 1 capsule (10 mg total) by mouth at bedtime as needed. 30 capsule 2   No current facility-administered medications for this visit.     Musculoskeletal: Strength & Muscle Tone: UTA Gait & Station: UTA Patient leans: N/A  Psychiatric Specialty  Exam: Review of Systems  Psychiatric/Behavioral: Negative for agitation, behavioral problems, confusion, decreased concentration, dysphoric mood, hallucinations, self-injury and sleep disturbance. The patient is not nervous/anxious and is not hyperactive.   All other systems reviewed and are negative.   There were no vitals taken for this visit.There is no height or weight on file to calculate BMI.  General Appearance: UTA  Eye Contact:  UTA  Speech:  Clear and Coherent  Volume:  Normal  Mood:  Euthymic  Affect:  UTA  Thought Process:  Goal Directed and Descriptions of Associations: Intact  Orientation:  Full (Time, Place, and Person)  Thought Content: Logical   Suicidal Thoughts:  No  Homicidal Thoughts:  No  Memory:  Immediate;   Fair Recent;   Fair Remote;   Fair  Judgement:  Fair  Insight:  Fair  Psychomotor Activity:  UTA  Concentration:  Concentration: Fair and Attention Span: Fair  Recall:  [employer] of Knowledge: Fair  Language: Fair  Akathisia:  No  Handed:  Right  AIMS (if indicated): UTA  Assets:  Communication Skills Desire for Improvement Housing Social Support  ADL's:  Intact  Cognition: WNL  Sleep:  Fair   Screenings: AIMS  Office Visit from 12/11/2017 in [facility] Office Visit from 11/29/2017 in [facility] Office Visit from 11/20/2017 in [facility] Admission (Discharged) from [facility]/21/2017 in [facility]  AIMS Total Score 1 7 5  0    AUDIT     Admission (Discharged) from 05/21/2017 in [facility]ICINE  Alcohol Use Disorder Identification Test Final Score (AUDIT) 6       Assessment and Plan: ALVERTA CACCAMO is a 39 year old Caucasian female who has a history of bipolar disorder, anxiety disorder, binge eating disorder was evaluated by phone today.  Patient is currently making progress with regards to her ADHD symptoms and is  currently stable with regards to her mood.  Plan as noted below.  Plan Bipolar disorder in remission Lamictal 150 mg p.o. daily Lithium 300 mg p.o. daily at reduced dosage. Lithium level-08/13/2019-0.18. Abilify 30 mg p.o. daily Wellbutrin XL 150 mg p.o. daily Discussed tapering off of some of the mood stabilizer if she continues to remain stable since she is on polypharmacy. Would consider doing this next visit if she continues to be stable.   Anxiety disorder-stable Gabapentin 100 mg 4 times daily. Hydroxyzine as needed Continue CBT as needed  ADHD-stable Continue Dexedrine 20 mg p.o. daily I have provided 2 prescriptions with date specified, last one to be filled on or after 01/07/2020 I have reviewed [place] controlled substance database.  High risk medication use-will order lithium level, repeat BUN/creatinine since her creatinine level was elevated at last visit.  Follow-up in clinic in 6 to 8 weeks or sooner if needed.  I have spent atleast 20 minutes non face to face  with patient today. More than 50 % of the time was spent for preparing to see the patient ( e.g., review of test, records ), obtaining and to review and separately obtained history , ordering medications and test ,psychoeducation and supportive psychotherapy and care coordination,as well as documenting clinical information in electronic health record. This note was generated in part or whole with voice recognition software. Voice recognition is usually quite accurate but there are transcription errors that can and very often do occur. I apologize for any typographical errors that were not detected and corrected.       01/09/2020, MD 12/09/2019, 6:14 PM

## 2020-01-25 ENCOUNTER — Telehealth: Admitting: Psychiatry

## 2020-01-26 ENCOUNTER — Telehealth: Payer: Self-pay | Admitting: *Deleted

## 2020-01-26 DIAGNOSIS — F900 Attention-deficit hyperactivity disorder, predominantly inattentive type: Secondary | ICD-10-CM

## 2020-01-26 NOTE — Telephone Encounter (Signed)
Patient called and stated she has to take Dexedrine 2 qam and 1 in the afternoon.  She is requesting a refill with new sig because she will runout sooner.  Please review.

## 2020-01-27 MED ORDER — DEXTROAMPHETAMINE SULFATE ER 10 MG PO CP24: 30.0000 mg | ORAL_CAPSULE | Freq: Every day | ORAL | 0 refills | Status: DC

## 2020-01-27 NOTE — Telephone Encounter (Signed)
I have sent Dexedrine to pharmacy with dosage increase.  Patient reported that the 20 mg was not helpful.  She would rather go up on the Dexedrine or try Vyvanse.  However per review of previous notes Vyvanse at 40 mg did not help her Adderall.  We will try to increase Dexedrine to 30 mg daily for the next 15 days.  She will call back with concerns.

## 2020-02-03 ENCOUNTER — Other Ambulatory Visit
Admission: RE | Admit: 2020-02-03 | Discharge: 2020-02-03 | Disposition: A | Discharge status: Home / Self Care | Attending: Psychiatry | Admitting: Psychiatry

## 2020-02-03 DIAGNOSIS — Z79899 Other long term (current) drug therapy: Secondary | ICD-10-CM

## 2020-02-03 LAB — LITHIUM LEVEL: Lithium Lvl: 0.1 mmol/L — ABNORMAL LOW (ref 0.60–1.20)

## 2020-02-03 NOTE — Addendum Note (Signed)
Addended by: Sandi Raveling on: 02/03/2020 09:35 AM   Modules accepted: Orders

## 2020-02-11 ENCOUNTER — Other Ambulatory Visit: Payer: Self-pay

## 2020-02-11 ENCOUNTER — Telehealth (INDEPENDENT_AMBULATORY_CARE_PROVIDER_SITE_OTHER): Admitting: Psychiatry

## 2020-02-11 ENCOUNTER — Encounter: Payer: Self-pay | Admitting: Psychiatry

## 2020-02-11 DIAGNOSIS — F3132 Bipolar disorder, current episode depressed, moderate: Secondary | ICD-10-CM

## 2020-02-11 DIAGNOSIS — F5105 Insomnia due to other mental disorder: Secondary | ICD-10-CM

## 2020-02-11 DIAGNOSIS — F411 Generalized anxiety disorder: Secondary | ICD-10-CM | POA: Diagnosis not present

## 2020-02-11 DIAGNOSIS — F900 Attention-deficit hyperactivity disorder, predominantly inattentive type: Secondary | ICD-10-CM

## 2020-02-11 DIAGNOSIS — F5081 Binge eating disorder: Secondary | ICD-10-CM

## 2020-02-11 DIAGNOSIS — Z79899 Other long term (current) drug therapy: Secondary | ICD-10-CM

## 2020-02-11 DIAGNOSIS — Z9189 Other specified personal risk factors, not elsewhere classified: Secondary | ICD-10-CM

## 2020-02-11 MED ORDER — LURASIDONE HCL 40 MG PO TABS: 40.0000 mg | ORAL_TABLET | Freq: Every day | ORAL | 0 refills | Status: DC

## 2020-02-11 MED ORDER — DEXTROAMPHETAMINE SULFATE ER 10 MG PO CP24: 30.0000 mg | ORAL_CAPSULE | Freq: Every day | ORAL | 0 refills | Status: DC

## 2020-02-11 NOTE — Patient Instructions (Signed)
Lurasidone Oral Tablets What is this medicine? LURASIDONE (loo RAS i done) is an antipsychotic. It is used to treat schizophrenia or bipolar disorder, also known as manic-depression. This medicine may be used for other purposes; ask your health care provider or pharmacist if you have questions. COMMON BRAND NAME(S): Latuda What should I tell my health care provider before I take this medicine? They need to know if you have any of these conditions:  dementia  diabetes  difficulty swallowing  have trouble controlling your muscles  heart disease  high cholesterol  history of breast cancer  history of high prolactin levels  history of stroke  kidney disease  liver disease  low blood counts, like low white cell, platelet, or red cell counts  low blood pressure  Parkinson's disease  seizures  suicidal thoughts, plans, or attempt; a previous suicide attempt by you or a family member  an unusual or allergic reaction to lurasidone, other medicines, foods, dyes, or preservatives  pregnant or trying to get pregnant  breast-feeding How should I use this medicine? Take this medicine by mouth with a glass of water. Follow the directions on the prescription label. Take this medicine with food. Take your medicine at regular intervals. Do not take it more often than directed. Do not stop taking except on your doctor's advice. A special MedGuide will be given to you by the pharmacist with each prescription and refill. Be sure to read this information carefully each time. Talk to your pediatrician regarding the use of this medicine in children. While this drug may be prescribed for children as young as 10 years for selected conditions, precautions do apply. Overdosage: If you think you have taken too much of this medicine contact a poison control center or emergency room at once. NOTE: This medicine is only for you. Do not share this medicine with others. What if I miss a dose? If  you miss a dose, take it as soon as you can. If it is almost time for your next dose, take only that dose. Do not take double or extra doses. What may interact with this medicine? Do not take this medicine with any of the following medications:  avasimibe  carbamazepine  certain medicines for fungal infections like ketoconazole, voriconazole  clarithromycin  metoclopramide  mibefradil  phenytoin  rifampin  ritonavir  St. John's wort This medicine may also interact with the following medications:  antihistamines for allergy, cough, and cold  bosentan  certain medicines for anxiety or sleep  certain medicines for depression like amitriptyline, fluoxetine, sertraline  certain medicines for HIV or hepatitis  erythromycin  fluconazole  general anesthetics like halothane, isoflurane, methoxyflurane, propofol  grapefruit juice  levodopa or other medicines for Parkinson's disease  medicines for blood pressure  medicines for seizures  medicines that relax muscles for surgery  modafinil  nafcillin  narcotic medicines for pain  phenothiazines like chlorpromazine, prochlorperazine, thioridazine This list may not describe all possible interactions. Give your health care provider a list of all the medicines, herbs, non-prescription drugs, or dietary supplements you use. Also tell them if you smoke, drink alcohol, or use illegal drugs. Some items may interact with your medicine. What should I watch for while using this medicine? Visit your health care professional for regular checks on your progress. Tell your health care professional if symptoms do not start to get better or if they get worse. Do not stop taking except on your health care professional's advice. You may develop a severe reaction.   Your health care professional will tell you how much medicine to take. Patients and their families should watch out for new or worsening depression or thoughts of suicide. Also  watch out for sudden changes in feelings such as feeling anxious, agitated, panicky, irritable, hostile, aggressive, impulsive, severely restless, overly excited and hyperactive, or not being able to sleep. If this happens, especially at the beginning of treatment or after a change in dose, call your healthcare professional. This medicine may increase blood sugar. Ask your health care provider if changes in diet or medicines are needed if you have diabetes. You may get dizzy or drowsy. Do not drive, use machinery, or do anything that needs mental alertness until you know how this medicine affects you. Do not stand or sit up quickly, especially if you are an older patient. This reduces the risk of dizzy or fainting spells. Alcohol may interfere with the effect of this medicine. Avoid alcoholic drinks. This medicine may cause dry eyes and blurred vision. If you wear contact lenses you may feel some discomfort. Lubricating drops may help. See your eye doctor if the problem does not go away or is severe. This drug can cause problems with controlling your body temperature. It can lower the response of your body to cold temperatures. If possible, stay indoors during cold weather. If you must go outdoors, wear warm clothes. It can also lower the response of your body to heat. Do not overheat. Do not over-exercise. Stay out of the sun when possible. If you must be in the sun, wear cool clothing. Drink plenty of water. If you have trouble controlling your body temperature, call your health care provider right away. What side effects may I notice from receiving this medicine? Side effects that you should report to your doctor or health care professional as soon as possible:  allergic reactions like skin rash, itching or hives, swelling of the face, lips, or tongue  breathing problems  confusion  elevated mood, decreased need for sleep, racing thoughts, impulsive behavior  fever or chills, sore  throat  inability to keep still  males: prolonged or painful erection  problems with balance, talking, walking  seizures  signs and symptoms of high blood sugar such as being more thirsty or hungry or having to urinate more than normal. You may also feel very tired or have blurry vision  signs and symptoms of low blood pressure like dizziness; feeling faint or lightheaded, falls; unusually weak or tired  signs and symptoms of neuroleptic malignant syndrome like confusion; fast or irregular heartbeat; high fever; increased sweating; stiff muscles  sudden numbness or weakness of the face, arm, or leg  suicidal thoughts or other mood changes  trouble swallowing  uncontrollable movements of the arms, face, head, mouth, neck, or upper body Side effects that usually do not require medical attention (report to your doctor or health care professional if they continue or are bothersome):  drowsiness  nausea  runny nose  tiredness  weight gain This list may not describe all possible side effects. Call your doctor for medical advice about side effects. You may report side effects to FDA at 1-800-FDA-1088. Where should I keep my medicine? Keep out of the reach of children. Store at room temperature between 15 and 30 degrees C (59 and 86 degrees F). Throw away any unused medicine after the expiration date. NOTE: This sheet is a summary. It may not cover all possible information. If you have questions about this medicine, talk to your   doctor, pharmacist, or health care provider.  2021 Elsevier/Gold Standard (2018-11-14 12:19:52)  

## 2020-02-11 NOTE — Progress Notes (Signed)
Virtual Visit via Video Note  I connected with Sierra [name] on 02/11/20 at 10:00 AM EST by a video enabled telemedicine application and verified that I am speaking with the correct person using two identifiers.  Location Provider Location : [facility] Patient Location : Home  Participants: Patient , Provider    I discussed the limitations of evaluation and management by telemedicine and the availability of in person appointments. The patient expressed understanding and agreed to proceed.   I discussed the assessment and treatment plan with the patient. The patient was provided an opportunity to ask questions and all were answered. The patient agreed with the plan and demonstrated an understanding of the instructions.   The patient was advised to call back or seek an in-person evaluation if the symptoms worsen or if the condition fails to improve as anticipated.   BH MD OP Progress Note  02/11/2020 11:20 AM Sierra [name]  MRN:  [id number]  Chief Complaint:  Chief Complaint    Follow-up     HPI: Sierra [name] is a 40 year old Caucasian female, married, lives in [place], has a history of bipolar disorder, GAD, binge eating disorder, ADHD was evaluated by telemedicine today.  Patient today reports she has been struggling with a lot of aches and pains recently. She reports she does have a history of fibromyalgia, bursitis as well as hip joint pain. She reports however this time she feels it is something more than that and her pain is being exacerbated possibly by her mood. She wonders whether she is depressed more than usual. She also wonders whether the winter months has any effect on her mood symptoms. She feels sad, struggles with low energy, lack of motivation and excessive sleep. She reports she has not been taking the sleep medication and in spite of that she has been sleeping more. This has been going on since the past 1 week or more. She also reports that since the past 1  week she has been struggling with an ear infection. She is currently on medication for the same. She was also started on tramadol for pain by her primary care provider.  Patient denies any suicidality, homicidality or perceptual disturbances.  She reports she is currently back in school however has been having trouble keeping up with her assignments due to her mood symptoms.  Patient is compliant on medications and denies side effects.  She denies any other concerns today.  Visit Diagnosis:    ICD-10-CM   1. Bipolar 1 disorder, depressed, moderate (HCC)  F31.32 lurasidone (LATUDA) 40 MG TABS tablet    lurasidone (LATUDA) 40 MG TABS tablet  2. GAD (generalized anxiety disorder)  F41.1   3. Attention deficit hyperactivity disorder (ADHD), predominantly inattentive type  F90.0 dextroamphetamine (DEXEDRINE SPANSULE) 10 MG 24 hr capsule  4. Insomnia due to mental condition  F51.05   5. Binge eating disorder  F50.81   6. High risk medication use  Z79.899   7. At risk for prolonged QT interval syndrome  Z91.89 EKG 12-Lead    Past Psychiatric History: I have reviewed past psychiatric history from my progress note on 06/20/2017  Past Medical History:  Past Medical History:  Diagnosis Date  . Abdominal pain, other specified site   . ADHD   . 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 Anemia   . Anxiety state, unspecified   . Bipolar 1 disorder, depressed (HCC) 05/21/2017  . BIPOLAR AFFECTIVE DISORDER 12/23/2006   Qualifier: Diagnosis of  By: Ermalene Searing  MD, Amy    . Bipolar affective disorder (HCC) 10/12/2011  . Bipolar disorder, unspecified (HCC)   . Depressive disorder, not elsewhere classified   . Edema   . Esophageal reflux   . Fibromyalgia   . Headache   . History of kidney stones   . Mild or unspecified pre-eclampsia, unspecified as to episode of care   . Morbid obesity (HCC)   . Myalgia and myositis, unspecified   . Other dyspnea and respiratory abnormality   . Preeclampsia     with first pregnancy  . Raynaud's disease   . Urinary tract infection, site not specified     Past Surgical History:  Procedure Laterality Date  . CESAREAN SECTION     X 2  . CHOLECYSTECTOMY  03/2006  . CYSTOSCOPY W/ URETERAL STENT PLACEMENT Left 05/14/2016   Procedure: CYSTOSCOPY WITH STENT REPLACEMENT;  Surgeon: [name], MD;  Location: [facility];  Service: Urology;  Laterality: Left;  . CYSTOSCOPY WITH STENT PLACEMENT Left 04/25/2016   Procedure: CYSTOSCOPY WITH STENT PLACEMENT;  Surgeon: [name], MD;  Location: [facility];  Service: Urology;  Laterality: Left;  . DILATION AND CURETTAGE OF UTERUS    . IVC FILTER INSERTION  03/26/2016   [facility]  . LAPAROSCOPIC GASTRIC RESTRICTIVE DUODENAL PROCEDURE (DUODENAL SWITCH)  03/2016  . LITHOTRIPSY    . TONSILLECTOMY AND ADENOIDECTOMY    . URETEROSCOPY WITH HOLMIUM LASER LITHOTRIPSY Left 05/14/2016   Procedure: URETEROSCOPY WITH HOLMIUM LASER LITHOTRIPSY;  Surgeon: [name], MD;  Location: [facility];  Service: Urology;  Laterality: Left;    Family Psychiatric History: I have reviewed family psychiatric history from my progress note on 06/20/2017  Family History:  Family History  Problem Relation Age of Onset  . Depression Father   . Alcohol abuse Father   . Depression Mother   . Hyperlipidemia Mother   . Sleep apnea Mother   . Depression Sister   . Hyperlipidemia Brother   . Depression Brother   . Hyperlipidemia Brother   . Depression Brother   . Alcohol abuse Brother   . Coronary artery disease Maternal Grandmother   . Heart attack Maternal Grandmother   . Diabetes Maternal Grandmother   . Lung cancer Maternal Grandfather   . Emphysema Maternal Grandfather   . Coronary artery disease Maternal Grandfather   . Lupus Other        Aunt  . Fibromyalgia Other        Aunt    Social History: Reviewed social history from my progress note on 06/20/2017 Social History   Socioeconomic History  . Marital status:  Married    Spouse name: brian  . Number of children: 2  . Years of education: Not on file  . Highest education level: Some college, no degree  Occupational History  . Occupation: Home maker  Tobacco Use  . Smoking status: Never Smoker  . Smokeless tobacco: Never Used  Vaping Use  . Vaping Use: Never used  Substance and Sexual Activity  . Alcohol use: Yes    Alcohol/week: 2.0 - 3.0 standard drinks    Types: 2 Glasses of wine per week  . Drug use: No  . Sexual activity: Not Currently    Partners: Male    Birth control/protection: None, I.U.D.  Other Topics Concern  . Not on file  Social History Narrative   1 child, 2 step-sons      Regular exercise-no   Diet: no fast food, likes sweets   Social Determinants  of Health   Financial Resource Strain: Not on file  Food Insecurity: Not on file  Transportation Needs: Not on file  Physical Activity: Not on file  Stress: Not on file  Social Connections: Not on file    Allergies:  Allergies  Allergen Reactions  . Uncaria Tomentosa (Cats Claw) Other (See Comments)    allgeric to cats in general causes sneezing  . Bee Pollen Itching  . Molds & Smuts Other (See Comments)  . Pollen Extract   . Milk-Related Compounds     Able to tolerate yogurt; mild intolerance to milk/cheese  . Pineapple Rash  . Soy Allergy Diarrhea  . Strawberry Extract Rash    Break out on tongue noted Break out on tongue noted Break out on tongue noted    Metabolic Disorder Labs: Lab Results  Component Value Date   HGBA1C 4.7 (L) 05/22/2017   MPG 88.19 05/22/2017   Lab Results  Component Value Date   PROLACTIN 7.6 11/07/2018   Lab Results  Component Value Date   CHOL 105 05/22/2017   TRIG 41 05/22/2017   HDL 48 05/22/2017   CHOLHDL 2.2 05/22/2017   VLDL 8 05/22/2017   LDLCALC 49 05/22/2017   LDLCALC 99 09/11/2013   Lab Results  Component Value Date   TSH 0.504 05/21/2018   TSH 0.677 05/22/2017    Therapeutic Level Labs: Lab  Results  Component Value Date   LITHIUM 0.10 (L) 02/03/2020   LITHIUM 0.18 (L) 08/13/2019   No results found for: VALPROATE No components found for:  CBMZ  Current Medications: Current Outpatient Medications  Medication Sig Dispense Refill  . lurasidone (LATUDA) 40 MG TABS tablet Take 1 tablet (40 mg total) by mouth daily with breakfast. 30 tablet 0  . Biotin 5000 MCG TABS Take 1 tablet by mouth daily.    Marland Kitchen buPROPion (WELLBUTRIN XL) 150 MG 24 hr tablet Take 1 tablet (150 mg total) by mouth daily. 90 tablet 0  . calcium citrate-vitamin D 500-400 MG-UNIT chewable tablet Chew 1 tablet by mouth daily.     . ciclopirox (PENLAC) 8 % solution Apply topically at bedtime.    . clonazePAM (KLONOPIN) 0.5 MG tablet Take 1 tablet (0.5 mg total) by mouth as directed. Take once every few days for severe panic attacks only. 10 tablet 0  . cyclobenzaprine (FLEXERIL) 10 MG tablet Take 10 mg by mouth 2 (two) times daily as needed for muscle spasms.    Melene Muller ON 02/18/2020] dextroamphetamine (DEXEDRINE SPANSULE) 10 MG 24 hr capsule Take 3 capsules (30 mg total) by mouth daily. Start taking 20 mg daily AM and 10 mg daily in the afternoon 90 capsule 0  . docusate sodium (COLACE) 100 MG capsule Take 300 mg by mouth daily.    . ferrous sulfate 325 (65 FE) MG tablet Take 325 mg by mouth daily with breakfast.    . gabapentin (NEURONTIN) 100 MG capsule Take 1 capsule (100 mg total) by mouth in the morning, at noon, in the evening, and at bedtime. 360 capsule 1  . hydrochlorothiazide (HYDRODIURIL) 25 MG tablet Take 25 mg by mouth daily as needed.    . hydrOXYzine (ATARAX/VISTARIL) 25 MG tablet TAKE 1 TABLET (25 MG TOTAL) BY MOUTH DAILY AS NEEDED FOR ANXIETY. 90 tablet 1  . lamoTRIgine (LAMICTAL) 100 MG tablet Take 1 tablet (100 mg total) by mouth daily. 90 tablet 0  . lamoTRIgine (LAMICTAL) 25 MG tablet Take 2 tablets (50 mg total) by mouth daily. To be  added to 100 mg daily 180 tablet 0  . levonorgestrel (MIRENA) 20  MCG/24HR IUD 1 each by Intrauterine route once.    . lithium carbonate (LITHOBID) 300 MG CR tablet TAKE 1 TABLET BY MOUTH EVERY DAY WITH SUPPER 90 tablet 0  . lurasidone (LATUDA) 40 MG TABS tablet Take 1 tablet (40 mg total) by mouth daily with breakfast. 10 tablet 0  . Milnacipran (SAVELLA) 50 MG TABS tablet Take 50 mg by mouth 2 (two) times daily. Takes differently    . montelukast (SINGULAIR) 10 MG tablet Take 10 mg by mouth daily.    . Multiple Vitamin (MULTIVITAMIN) tablet Take 1 tablet by mouth daily.    Marland Kitchen. NIFEdipine (PROCARDIA-XL/ADALAT CC) 60 MG 24 hr tablet Take 60 mg by mouth daily.     . Norethindrone Acetate-Ethinyl Estrad-FE (LOESTRIN 24 FE) 1-20 MG-MCG(24) tablet Take 1 tablet by mouth daily. 3 Package 4  . omeprazole (PRILOSEC) 20 MG capsule Take 20 mg by mouth daily.    . predniSONE (DELTASONE) 10 MG tablet Take 10 mg by mouth 2 (two) times daily. (Patient not taking: Reported on 11/11/2019)    . tamsulosin (FLOMAX) 0.4 MG CAPS capsule Take 0.4 mg by mouth daily.    . zaleplon (SONATA) 10 MG capsule Take 1 capsule (10 mg total) by mouth at bedtime as needed. 30 capsule 2   No current facility-administered medications for this visit.     Musculoskeletal: Strength & Muscle Tone: UTA Gait & Station: UTA Patient leans: N/A  Psychiatric Specialty Exam: Review of Systems  Constitutional: Positive for fatigue.  HENT: Positive for ear pain.   Musculoskeletal: Positive for arthralgias and myalgias.  Psychiatric/Behavioral: Positive for dysphoric mood and sleep disturbance.  All other systems reviewed and are negative.   There were no vitals taken for this visit.There is no height or weight on file to calculate BMI.  General Appearance: Casual  Eye Contact:  Fair  Speech:  Clear and Coherent  Volume:  Normal  Mood:  Depressed  Affect:  Congruent  Thought Process:  Goal Directed and Descriptions of Associations: Intact  Orientation:  Full (Time, Place, and Person)  Thought  Content: Rumination   Suicidal Thoughts:  No  Homicidal Thoughts:  No  Memory:  Immediate;   Fair Recent;   Fair Remote;   Fair  Judgement:  Fair  Insight:  Fair  Psychomotor Activity:  Normal  Concentration:  Concentration: Fair and Attention Span: Fair  Recall:  [employer]Fair  Fund of Knowledge: Fair  Language: Fair  Akathisia:  No  Handed:  Right  AIMS (if indicated): UTA  Assets:  Communication Skills Desire for Improvement Housing Social Support  ADL's:  Intact  Cognition: WNL  Sleep:  Excessive   Screenings: AIMS   Flowsheet Row [facility] Office [facility] from [facility]2017 [facility] Office Visit from [facility]/20[facility]2017 [facility] Admission (Discharged) from [facility]/21/2017 [facility]  AIMS Total Score 1 7 5  0    AUDIT   Flowsheet Row [facility]Discharged[facility]  Alcohol Use Disorder Identification Test Final Score (AUDIT) 6    PHQ2-9   Flowsheet Row Video Visit from 02/11/2020 in [facility]  PHQ-2 Total Score 5  PHQ-9 Total Score 16       Assessment and Plan: Sierra [name] is a 40 year old Caucasian female who has a history of bipolar disorder, anxiety disorder,  binge eating disorder was evaluated by telemedicine today. Patient is currently struggling with depressive symptoms, excessive sleepiness, as well as exacerbation of her fibromyalgia and has an ear infection. Patient will benefit from the following plan.  Plan Bipolar disorder current episode depressed-moderate Lamictal 150 mg p.o. daily Lithium 300 mg p.o. daily at reduced dosage-dose reduced due to side effects of tremors. Lithium level reviewed and discussed with patient-dated 02/03/2020-0.10-subtherapeutic Discontinue Abilify for lack of benefit. Start Latuda 40 mg p.o. daily with  breakfast Continue Wellbutrin XL 150 mg p.o. daily. If she has a good response to the [place] will taper off Wellbutrin. Patient is on polypharmacy at this time and advised patient to monitor for drug to drug interaction including serotonin syndrome and other side effects.  Anxiety disorder-stable Gabapentin 100 mg 4 times daily Hydroxyzine as needed Continue CBT as needed  ADHD-some progress Dexedrine 30 mg p.o. daily. We'll provide 1 prescription. Reviewed [place] controlled substance database.  Binge eating disorder-stable We'll monitor closely.  High risk medication use-lithium level reviewed and discussed with patient-dated 02/03/2020-subtherapeutic. However patient is on a low dose Pending TSH-she will fax it from her primary care provider's office. Pending BUN/creatinine-will request from her primary care provider's office.   At risk for prolonged QT syndrome-will order EKG since she is on multiple medications which can have an impact on her QT.  Follow-up in clinic in 2 weeks or sooner if needed.  I have spent atleast 30 minutes face to face by video with patient today. More than 50 % of the time was spent for preparing to see the patient ( e.g., review of test, records ), obtaining and to review and separately obtained history , ordering medications and test ,psychoeducation and supportive psychotherapy and care coordination,as well as documenting clinical information in electronic health record,interpreting and communication of test results This note was generated in part or whole with voice recognition software. Voice recognition is usually quite accurate but there are transcription errors that can and very often do occur. I apologize for any typographical errors that were not detected and corrected.         Jomarie Longs, MD 02/12/2020, 8:27 AM

## 2020-02-22 ENCOUNTER — Ambulatory Visit (INDEPENDENT_AMBULATORY_CARE_PROVIDER_SITE_OTHER): Admitting: Obstetrics & Gynecology

## 2020-02-22 ENCOUNTER — Other Ambulatory Visit: Payer: Self-pay

## 2020-02-22 ENCOUNTER — Encounter: Payer: Self-pay | Admitting: Obstetrics & Gynecology

## 2020-02-22 VITALS — BP 124/82 | Ht 64.0 in | Wt 343.0 lb

## 2020-02-22 DIAGNOSIS — Z01419 Encounter for gynecological examination (general) (routine) without abnormal findings: Secondary | ICD-10-CM | POA: Diagnosis not present

## 2020-02-22 DIAGNOSIS — Z30431 Encounter for routine checking of intrauterine contraceptive device: Secondary | ICD-10-CM | POA: Diagnosis not present

## 2020-02-22 DIAGNOSIS — N870 Mild cervical dysplasia: Secondary | ICD-10-CM

## 2020-02-22 DIAGNOSIS — N921 Excessive and frequent menstruation with irregular cycle: Secondary | ICD-10-CM

## 2020-02-22 DIAGNOSIS — Z6841 Body Mass Index (BMI) 40.0 and over, adult: Secondary | ICD-10-CM

## 2020-02-22 DIAGNOSIS — E66813 Obesity, class 3: Secondary | ICD-10-CM

## 2020-02-22 DIAGNOSIS — Z975 Presence of (intrauterine) contraceptive device: Secondary | ICD-10-CM

## 2020-02-22 MED ORDER — NORETHIN ACE-ETH ESTRAD-FE 1-20 MG-MCG(24) PO TABS: 1.0000 | ORAL_TABLET | Freq: Every day | ORAL | 4 refills | Status: DC

## 2020-02-22 NOTE — Addendum Note (Signed)
Addended by: Richardson Chiquito on: 02/22/2020 11:39 AM   Modules accepted: Orders

## 2020-02-22 NOTE — Progress Notes (Signed)
Sierra [name] September 24, 1980 [id number]   History:    40 y.o. [id number] Divorced.  Boyfriend since 2019.  RP:  Established patient presenting for annual gyn exam   HPI: Well on Mirena IUD x 08/2018.  No BTB with the Progestin pill.  No pelvic pain.  No pain with IC.  Breasts normal.  Urine/BMs normal. Had Bariatric surgery 03/2016.  BMI increased back to 58.88 this year. Walking daily.  Low carb nutrition.  Health Labs with Fam MD.  Past medical history,surgical history, family history and social history were all reviewed and documented in the EPIC chart.  Gynecologic History No LMP recorded. (Menstrual status: IUD).  Obstetric History OB History  Gravida Para Term Preterm AB Living  5 2     2 2   SAB IAB Ectopic Multiple Live Births  2            # Outcome Date GA Lbr Len/2nd Weight Sex Delivery Anes PTL Lv  5 Gravida           4 SAB           3 SAB           2 Para           1 Para              ROS: A ROS was performed and pertinent positives and negatives are included in the history.  GENERAL: No fevers or chills. HEENT: No change in vision, no earache, sore throat or sinus congestion. NECK: No pain or stiffness. CARDIOVASCULAR: No chest pain or pressure. No palpitations. PULMONARY: No shortness of breath, cough or wheeze. GASTROINTESTINAL: No abdominal pain, nausea, vomiting or diarrhea, melena or bright red blood per rectum. GENITOURINARY: No urinary frequency, urgency, hesitancy or dysuria. MUSCULOSKELETAL: No joint or muscle pain, no back pain, no recent trauma. DERMATOLOGIC: No rash, no itching, no lesions. ENDOCRINE: No polyuria, polydipsia, no heat or cold intolerance. No recent change in weight. HEMATOLOGICAL: No anemia or easy bruising or bleeding. NEUROLOGIC: No headache, seizures, numbness, tingling or weakness. PSYCHIATRIC: No depression, no loss of interest in normal activity or change in sleep pattern.     Exam:   BP 124/82 (BP Location: Right Arm, Cuff Size:  Large)   Ht 5\' 4"  (1.626 m)   Wt (!) 343 lb (155.6 kg)   BMI 58.88 kg/m   Body mass index is 58.88 kg/m.  General appearance : Well developed well nourished female. No acute distress HEENT: Eyes: no retinal hemorrhage or exudates,  Neck supple, trachea midline, no carotid bruits, no thyroidmegaly Lungs: Clear to auscultation, no rhonchi or wheezes, or rib retractions  Heart: Regular rate and rhythm, no murmurs or gallops Breast:Examined in sitting and supine position were symmetrical in appearance, no palpable masses or tenderness,  no skin retraction, no nipple inversion, no nipple discharge, no skin discoloration, no axillary or supraclavicular lymphadenopathy Abdomen: no palpable masses or tenderness, no rebound or guarding Extremities: no edema or skin discoloration or tenderness  Pelvic: Vulva: Normal             Vagina: No gross lesions or discharge  Cervix: No gross lesions or discharge.  [facility]UD string felt at the [facility].  Pap/HPV HR done.  Uterus  AV, normal size, shape and consistency, non-tender and mobile  Adnexa  Without masses or tenderness  Anus: Normal   Assessment/Plan:  40 y.o. female for annual exam   1. Encounter for routine gynecological examination with Papanicolaou  smear of cervix Normal gynecologic exam.  Pap/HPV HR done.  Breast exam normal.  Health labs with Fam MD.  2. 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  3. Encounter for routine checking of intrauterine contraceptive device (IUD) Well on Mirena IUD x 08/2018. IUD in good position. On BCPs as well to control BTB.  4. Breakthrough bleeding associated with intrauterine device (IUD) Well on the generic of LeEstrin 24 Fe 1/20.  No CI to continue.  Prescription sent to pharmacy.  5. Class 3 severe obesity due to excess calories without serious comorbidity with body mass index (BMI) of 50.0 to 59.9 in adult [facility]) Low calorie/carb diet.  Aerobic activities 5 times a week with light weight  lifting recommended every 2 days.  Other orders - Norethindrone Acetate-Ethinyl Estrad-FE (LOESTRIN 24 FE) 1-20 MG-MCG(24) tablet; Take 1 tablet by mouth daily.  Genia Del MD, 9:47 AM 02/22/2020

## 2020-02-24 LAB — PAP IG AND HPV HIGH-RISK: HPV DNA High Risk: DETECTED — AB

## 2020-02-28 ENCOUNTER — Other Ambulatory Visit: Payer: Self-pay | Admitting: Obstetrics & Gynecology

## 2020-02-29 NOTE — Telephone Encounter (Signed)
Dr.Lavoie approved Rx on 02/22/20 #84 with 4 refills.

## 2020-03-01 ENCOUNTER — Telehealth (INDEPENDENT_AMBULATORY_CARE_PROVIDER_SITE_OTHER): Admitting: Psychiatry

## 2020-03-01 ENCOUNTER — Other Ambulatory Visit: Payer: Self-pay

## 2020-03-01 ENCOUNTER — Encounter: Payer: Self-pay | Admitting: Psychiatry

## 2020-03-01 DIAGNOSIS — F411 Generalized anxiety disorder: Secondary | ICD-10-CM

## 2020-03-01 DIAGNOSIS — F900 Attention-deficit hyperactivity disorder, predominantly inattentive type: Secondary | ICD-10-CM

## 2020-03-01 DIAGNOSIS — F5105 Insomnia due to other mental disorder: Secondary | ICD-10-CM | POA: Diagnosis not present

## 2020-03-01 DIAGNOSIS — F3132 Bipolar disorder, current episode depressed, moderate: Secondary | ICD-10-CM | POA: Diagnosis not present

## 2020-03-01 DIAGNOSIS — Z79899 Other long term (current) drug therapy: Secondary | ICD-10-CM

## 2020-03-01 DIAGNOSIS — F5081 Binge eating disorder: Secondary | ICD-10-CM

## 2020-03-01 DIAGNOSIS — R251 Tremor, unspecified: Secondary | ICD-10-CM | POA: Insufficient documentation

## 2020-03-01 MED ORDER — LAMOTRIGINE 25 MG PO TABS: 50.0000 mg | ORAL_TABLET | Freq: Every day | ORAL | 0 refills | Status: DC

## 2020-03-01 MED ORDER — LURASIDONE HCL 40 MG PO TABS: 40.0000 mg | ORAL_TABLET | Freq: Every day | ORAL | 0 refills | Status: DC

## 2020-03-01 MED ORDER — BENZTROPINE MESYLATE 0.5 MG PO TABS: 0.5000 mg | ORAL_TABLET | Freq: Two times a day (BID) | ORAL | 0 refills | Status: DC

## 2020-03-01 MED ORDER — LAMOTRIGINE 100 MG PO TABS: 100.0000 mg | ORAL_TABLET | Freq: Every day | ORAL | 0 refills | Status: DC

## 2020-03-01 MED ORDER — ZALEPLON 10 MG PO CAPS: 10.0000 mg | ORAL_CAPSULE | Freq: Every evening | ORAL | 2 refills | Status: DC | PRN

## 2020-03-01 MED ORDER — BUPROPION HCL ER (XL) 150 MG PO TB24: 150.0000 mg | ORAL_TABLET | Freq: Every day | ORAL | 0 refills | Status: DC

## 2020-03-01 MED ORDER — LITHIUM CARBONATE ER 300 MG PO TBCR: EXTENDED_RELEASE_TABLET | ORAL | 0 refills | Status: DC

## 2020-03-01 NOTE — Progress Notes (Signed)
Virtual Visit via Video Note  I connected with Sierra [name] on 03/01/20 at 10:00 AM EST by a video enabled telemedicine application and verified that I am speaking with the correct person using two identifiers.  Location Provider Location : [facility] Patient Location : Home  Participants: Patient , Provider    I discussed the limitations of evaluation and management by telemedicine and the availability of in person appointments. The patient expressed understanding and agreed to proceed.   I discussed the assessment and treatment plan with the patient. The patient was provided an opportunity to ask questions and all were answered. The patient agreed with the plan and demonstrated an understanding of the instructions.   The patient was advised to call back or seek an in-person evaluation if the symptoms worsen or if the condition fails to improve as anticipated.   BH MD OP Progress Note  03/01/2020 1:19 PM KELICIA YOUTZ  MRN:  [id number]  Chief Complaint:  Chief Complaint    Follow-up     HPI: Sierra [name] is a 40 year old Caucasian female, married, lives in [place], has a history of bipolar disorder, GAD, binge eating disorder, ADHD was evaluated by telemedicine today.  Patient today reports she continues to struggle with depressive symptoms. She reports she just started the Jordan a week ago since she had trouble getting it by mail in delivery. She reports she has not noticed any benefit from it yet. She however reports her tremors have worsened  the past few weeks. She reports she had tremors even when she was on Abilify however she feels as though since getting off of the Abilify it is getting worse. She reports she does trouble writing due to her tremors however she is coping okay and it does not have a lot of effect on her daily functioning.  Patient reports sleep is affected. She reports she takes the [place] only as needed. She has been trying to unwind and work on  sleep hygiene techniques however she reports she has difficulty falling asleep and most nights she wakes up and takes a Restaurant manager, fast food and goes back to bed. She reports her sleep is interrupted and she sleeps late and stays in bed late in the morning.  Patient reports she also struggles with pain. She reports she is currently struggling with fibromyalgia pain which mostly affects her back, radiates down to her hip. She reports she was on [place] however she ran out. She is planning to get in touch with her provider today to get back on it. She currently takes Motrin as well as tramadol as needed. That does help to some extent. She reports the pain as well as her depressive symptoms have an impact on her ability to function at school. She feels as though she is struggling with her schoolwork and that does affect her anxiety, makes her more worried.  She also reports she recently got a speeding ticket and that does have an impact on her mood level as well. She is trying to work with her attorney. She reports she was in a hurry that day and does not clearly remember speeding until she was pulled over.  Patient denies any suicidality, homicidality or perceptual disturbances.  Patient denies any other concerns today   Visit Diagnosis:    ICD-10-CM   1. Bipolar 1 disorder, depressed, moderate (HCC)  F31.32 lurasidone (LATUDA) 40 MG TABS tablet  2. GAD (generalized anxiety disorder)  F41.1 zaleplon (SONATA) 10 MG capsule    lithium  carbonate (LITHOBID) 300 MG CR tablet    lamoTRIgine (LAMICTAL) 100 MG tablet    buPROPion (WELLBUTRIN XL) 150 MG 24 hr tablet  3. Attention deficit hyperactivity disorder (ADHD), predominantly inattentive type  F90.0   4. Insomnia due to mental condition  F51.05 lamoTRIgine (LAMICTAL) 25 MG tablet  5. Binge eating disorder  F50.81   6. Tremor  R25.1 benztropine (COGENTIN) 0.5 MG tablet  7. High risk medication use  Z79.899     Past Psychiatric History: I have reviewed past  psychiatric history from my progress note on 06/20/2017  Past Medical History:  Past Medical History:  Diagnosis Date  . Abdominal pain, other specified site   . ADHD   . 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 Anemia   . Anxiety state, unspecified   . Bipolar 1 disorder, depressed (HCC) 05/21/2017  . BIPOLAR AFFECTIVE DISORDER 12/23/2006   Qualifier: Diagnosis of  By: Ermalene Searing MD, Amy    . Bipolar affective disorder (HCC) 10/12/2011  . Bipolar disorder, unspecified (HCC)   . Depressive disorder, not elsewhere classified   . Edema   . Esophageal reflux   . Fibromyalgia   . Headache   . History of kidney stones   . Mild or unspecified pre-eclampsia, unspecified as to episode of care   . Morbid obesity (HCC)   . Myalgia and myositis, unspecified   . Other dyspnea and respiratory abnormality   . Preeclampsia    with first pregnancy  . Raynaud's disease   . Urinary tract infection, site not specified     Past Surgical History:  Procedure Laterality Date  . CESAREAN SECTION     X 2  . CHOLECYSTECTOMY  03/2006  . CYSTOSCOPY W/ URETERAL STENT PLACEMENT Left 05/14/2016   Procedure: CYSTOSCOPY WITH STENT REPLACEMENT;  Surgeon: [name], MD;  Location: [facility] OR[facility];  Service: Urology;  Laterality: Left;  . CYSTOSCOPY WITH STENT PLACEMENT Left 04/25/2016   Procedure: CYSTOSCOPY WITH STENT PLACEMENT;  Surgeon: [name], MD;  Location: [facility];  Service: Urology;  Laterality: Left;  . DILATION AND CURETTAGE OF UTERUS    . IVC FILTER INSERTION  03/26/2016   [facility]  . LAPAROSCOPIC GASTRIC RESTRICTIVE DUODENAL PROCEDURE (DUODENAL SWITCH)  03/2016  . LITHOTRIPSY    . TONSILLECTOMY AND ADENOIDECTOMY    . URETEROSCOPY WITH HOLMIUM LASER LITHOTRIPSY Left 05/14/2016   Procedure: URETEROSCOPY WITH HOLMIUM LASER LITHOTRIPSY;  Surgeon: [name], MD;  Location: [facility] OR[facility];  Service: Urology;  Laterality: Left;    Family Psychiatric History: I have reviewed family psychiatric  history from my progress note on 06/20/2017  Family History:  Family History  Problem Relation Age of Onset  . Depression Father   . Alcohol abuse Father   . Depression Mother   . Hyperlipidemia Mother   . Sleep apnea Mother   . Depression Sister   . Hyperlipidemia Brother   . Depression Brother   . Hyperlipidemia Brother   . Depression Brother   . Alcohol abuse Brother   . Coronary artery disease Maternal Grandmother   . Heart attack Maternal Grandmother   . Diabetes Maternal Grandmother   . Lung cancer Maternal Grandfather   . Emphysema Maternal Grandfather   . Coronary artery disease Maternal Grandfather   . Lupus Other        Aunt  . Fibromyalgia Other        Aunt    Social History: Reviewed social history from my progress note on 06/20/2017 Social History  Socioeconomic History  . Marital status: Married    Spouse name: brian  . Number of children: 2  . Years of education: Not on file  . Highest education level: Some college, no degree  Occupational History  . Occupation: Home maker  Tobacco Use  . Smoking status: Never Smoker  . Smokeless tobacco: Never Used  Vaping Use  . Vaping Use: Never used  Substance and Sexual Activity  . Alcohol use: Yes    Alcohol/week: 0.0 standard drinks    Comment: occassional  . Drug use: No  . Sexual activity: Yes    Partners: Male    Birth control/protection: None, I.U.D.  Other Topics Concern  . Not on file  Social History Narrative   1 child, 2 step-sons      Regular exercise-no   Diet: no fast food, likes sweets   Social Determinants of Health   Financial Resource Strain: Not on file  Food Insecurity: Not on file  Transportation Needs: Not on file  Physical Activity: Not on file  Stress: Not on file  Social Connections: Not on file    Allergies:  Allergies  Allergen Reactions  . Uncaria Tomentosa (Cats Claw) Other (See Comments)    allgeric to cats in general causes sneezing  . Bee Pollen Itching  .  Molds & Smuts Other (See Comments)  . Pollen Extract   . Milk-Related Compounds     Able to tolerate yogurt; mild intolerance to milk/cheese  . Pineapple Rash  . Soy Allergy Diarrhea  . Strawberry Extract Rash    Break out on tongue noted Break out on tongue noted Break out on tongue noted    Metabolic Disorder Labs: Lab Results  Component Value Date   HGBA1C 4.7 (L) 05/22/2017   MPG 88.19 05/22/2017   Lab Results  Component Value Date   PROLACTIN 7.6 11/07/2018   Lab Results  Component Value Date   CHOL 105 05/22/2017   TRIG 41 05/22/2017   HDL 48 05/22/2017   CHOLHDL 2.2 05/22/2017   VLDL 8 05/22/2017   LDLCALC 49 05/22/2017   LDLCALC 99 09/11/2013   Lab Results  Component Value Date   TSH 0.504 05/21/2018   TSH 0.677 05/22/2017    Therapeutic Level Labs: Lab Results  Component Value Date   LITHIUM 0.10 (L) 02/03/2020   LITHIUM 0.18 (L) 08/13/2019   No results found for: VALPROATE No components found for:  CBMZ  Current Medications: Current Outpatient Medications  Medication Sig Dispense Refill  . benztropine (COGENTIN) 0.5 MG tablet Take 1 tablet (0.5 mg total) by mouth 2 (two) times daily. 60 tablet 0  . amoxicillin (AMOXIL) 875 MG tablet SMARTSIG:1 Tablet(s) By Mouth Every 12 Hours    . Biotin 5000 MCG TABS Take 1 tablet by mouth daily.    Marland Kitchen BLISOVI 24 FE 1-20 MG-MCG(24) tablet TAKE 1 TABLET BY MOUTH EVERY DAY 84 tablet 4  . buPROPion (WELLBUTRIN XL) 150 MG 24 hr tablet Take 1 tablet (150 mg total) by mouth daily. 90 tablet 0  . calcium citrate-vitamin D 500-400 MG-UNIT chewable tablet Chew 1 tablet by mouth daily.     . ciclopirox (PENLAC) 8 % solution Apply topically at bedtime. (Patient not taking: Reported on 02/22/2020)    . clonazePAM (KLONOPIN) 0.5 MG tablet Take 1 tablet (0.5 mg total) by mouth as directed. Take once every few days for severe panic attacks only. 10 tablet 0  . cyclobenzaprine (FLEXERIL) 10 MG tablet Take 10 mg by mouth  2 (two)  times daily as needed for muscle spasms.    Marland Kitchen dextroamphetamine (DEXEDRINE SPANSULE) 10 MG 24 hr capsule Take 3 capsules (30 mg total) by mouth daily. Start taking 20 mg daily AM and 10 mg daily in the afternoon 90 capsule 0  . docusate sodium (COLACE) 100 MG capsule Take 300 mg by mouth daily.    . ferrous sulfate 325 (65 FE) MG tablet Take 325 mg by mouth daily with breakfast.    . fluconazole (DIFLUCAN) 150 MG tablet Take 150 mg by mouth daily.    Marland Kitchen gabapentin (NEURONTIN) 100 MG capsule Take 1 capsule (100 mg total) by mouth in the morning, at noon, in the evening, and at bedtime. 360 capsule 1  . hydrochlorothiazide (HYDRODIURIL) 25 MG tablet Take 25 mg by mouth daily as needed.    . hydrOXYzine (ATARAX/VISTARIL) 25 MG tablet TAKE 1 TABLET (25 MG TOTAL) BY MOUTH DAILY AS NEEDED FOR ANXIETY. 90 tablet 1  . lamoTRIgine (LAMICTAL) 100 MG tablet Take 1 tablet (100 mg total) by mouth daily. 90 tablet 0  . lamoTRIgine (LAMICTAL) 25 MG tablet Take 2 tablets (50 mg total) by mouth daily. To be added to 100 mg daily 180 tablet 0  . levonorgestrel (MIRENA) 20 MCG/24HR IUD 1 each by Intrauterine route once.    . lithium carbonate (LITHOBID) 300 MG CR tablet TAKE 1 TABLET BY MOUTH EVERY DAY WITH SUPPER 90 tablet 0  . lurasidone (LATUDA) 40 MG TABS tablet Take 1 tablet (40 mg total) by mouth daily with breakfast. 10 tablet 0  . lurasidone (LATUDA) 40 MG TABS tablet Take 1 tablet (40 mg total) by mouth daily with breakfast. 30 tablet 0  . Milnacipran (SAVELLA) 50 MG TABS tablet Take 50 mg by mouth 2 (two) times daily. Takes differently    . montelukast (SINGULAIR) 10 MG tablet Take 10 mg by mouth daily.    . Multiple Vitamin (MULTIVITAMIN) tablet Take 1 tablet by mouth daily.    Marland Kitchen NIFEdipine (PROCARDIA-XL/ADALAT CC) 60 MG 24 hr tablet Take 60 mg by mouth daily.     Marland Kitchen omeprazole (PRILOSEC) 20 MG capsule Take 20 mg by mouth daily.    . tamsulosin (FLOMAX) 0.4 MG CAPS capsule Take 0.4 mg by mouth daily.  (Patient not taking: Reported on 02/22/2020)    . traMADol-acetaminophen (ULTRACET) 37.5-325 MG tablet Take 1 tablet by mouth daily as needed.    . zaleplon (SONATA) 10 MG capsule Take 1 capsule (10 mg total) by mouth at bedtime as needed. 30 capsule 2   No current facility-administered medications for this visit.     Musculoskeletal: Strength & Muscle Tone: UTA Gait & Station: UTA Patient leans: N/A  Psychiatric Specialty Exam: Review of Systems  Constitutional: Positive for appetite change and fatigue.  Musculoskeletal: Positive for back pain and myalgias.  Neurological: Positive for tremors.  Psychiatric/Behavioral: Positive for decreased concentration, dysphoric mood and sleep disturbance. The patient is nervous/anxious.   All other systems reviewed and are negative.   There were no vitals taken for this visit.There is no height or weight on file to calculate BMI.  General Appearance: Casual  Eye Contact:  Fair  Speech:  Clear and Coherent  Volume:  Normal  Mood:  Anxious and Dysphoric  Affect:  Congruent  Thought Process:  Goal Directed and Descriptions of Associations: Intact  Orientation:  Full (Time, Place, and Person)  Thought Content: Rumination   Suicidal Thoughts:  No  Homicidal Thoughts:  No  Memory:  Immediate;   Fair Recent;   Fair Remote;   Fair  Judgement:  Fair  Insight:  Fair  Psychomotor Activity:  Tremor  Concentration:  Concentration: Fair and Attention Span: Fair  Recall:  FiservFair  Fund of Knowledge: Fair  Language: Fair  Akathisia:  No  Handed:  Right  AIMS: Done  Assets:  Communication Skills Desire for Improvement Housing Intimacy Social Support  ADL's:  Intact  Cognition: WNL  Sleep:  Poor   Screenings: AIMS   Flowsheet Row Video Visit from 03/01/2020 in [facility] Office [facility] Office Visit from 11/29/2017 [facility]  Office Visit from [facility]20/2017 in [facility] Admission (Discharged) from [facility]21[facility]  AIMS Total Score 8 1 7 5  0    AUDIT   Flowsheet Row [facility]  Alcohol Use Disorder Identification Test Final Score (AUDIT) 6    PHQ2-9   Flowsheet Row [facility]  PHQ-2 Total Score 4 5  PHQ-9 Total Score 14 16    Flowsheet Row Video Visit from 03/01/2020 in [facility]  C-SSRS RISK CATEGORY No Risk       Assessment and Plan: [name] Ralph is a 40 year old Caucasian female who has a history of bipolar disorder, anxiety disorder, binge eating disorder was evaluated by telemedicine today. Patient is currently struggling with depressive symptoms, anxiety, fibromyalgia pain. She also possibly with adverse side effects to medications, has tremors. She will benefit from the following plan.  Plan Bipolar disorder depressed-unstable Latuda 40 mg p.o. daily with breakfast-she just started it a week ago. Lamictal 150 mg p.o. daily Lithium 300 mg p.o. daily at reduced dosage-dosage previously reduced due to tremors. Lithium level-02/03/2020-0.10-subtherapeutic. Continue Wellbutrin for now however if she has a good response to the JordanLatuda will taper it off. Patient is currently on polypharmacy and will benefit from being taken off of some of these medications including the Wellbutrin. Patient also advised about drug to drug interaction between medications especially with tramadol, serotonin syndrome. She will go to the emergency department if she has any adverse side effects like that.  Anxiety disorder-unstable Continue gabapentin 100 mg p.o. 4 times daily Hydroxyzine as needed Patient advised to restart CBT-patient provided  psychology today information. She reports she has been noncompliant with therapy.  ADHD-unstable Patient's concentration currently affected by her pain, her anxiety and current situational stressors. Will not make changes with her dosage of Dexedrine today. Continue Dexedrine 30 mg p.o. daily   Binge eating disorder-stable We will monitor closely Patient does report appetite suppression, unknown if this is due to her current medications or her mood symptoms. Patient advised to keep a food log and she will bring it to the next session.   Tremors-unstable-likely due to psychotropic medications AIMS today equals 8 Start Cogentin 0.5 mg p.o. 3 times daily for now. Will consider referral to neurology in the future.  High risk medication use-patient advised to fax labs-TSH, BUN/creatinine report to clinic. Also EKG pending  Follow-up in clinic in 10 days or sooner if needed.  I have spent atleast 30 minutes face to face by video with patient today. More than 50 % of the time was spent for preparing to see the patient ( e.g., review of test, records ), obtaining and to review  and separately obtained history , ordering medications and test ,psychoeducation and supportive psychotherapy and care coordination,as well as documenting clinical information in electronic health record. This note was generated in part or whole with voice recognition software. Voice recognition is usually quite accurate but there are transcription errors that can and very often do occur. I apologize for any typographical errors that were not detected and corrected.       Jomarie Longs, MD 03/01/2020, 1:19 PM

## 2020-03-02 ENCOUNTER — Encounter: Payer: Self-pay | Admitting: Obstetrics & Gynecology

## 2020-03-02 ENCOUNTER — Ambulatory Visit (INDEPENDENT_AMBULATORY_CARE_PROVIDER_SITE_OTHER): Admitting: Obstetrics & Gynecology

## 2020-03-02 VITALS — BP 120/76

## 2020-03-02 DIAGNOSIS — R87810 Cervical high risk human papillomavirus (HPV) DNA test positive: Secondary | ICD-10-CM | POA: Diagnosis not present

## 2020-03-02 DIAGNOSIS — N921 Excessive and frequent menstruation with irregular cycle: Secondary | ICD-10-CM

## 2020-03-02 DIAGNOSIS — R87612 Low grade squamous intraepithelial lesion on cytologic smear of cervix (LGSIL): Secondary | ICD-10-CM | POA: Diagnosis not present

## 2020-03-02 DIAGNOSIS — N870 Mild cervical dysplasia: Secondary | ICD-10-CM | POA: Diagnosis not present

## 2020-03-02 DIAGNOSIS — Z975 Presence of (intrauterine) contraceptive device: Secondary | ICD-10-CM

## 2020-03-02 DIAGNOSIS — Z6841 Body Mass Index (BMI) 40.0 and over, adult: Secondary | ICD-10-CM

## 2020-03-02 NOTE — Progress Notes (Signed)
    Sierra [name] [birth year] [id number]        40 y.o.  [id number]   RP: Persistent abnormal Pap LGSIL 02/2020 with h/o HPV 16 positive for counseling on management  HPI:  Persistent abnormal Pap LGSIL 02/2020 with h/o HPV 16 positive.  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  C/O frequent BTB on Mirena IUD, now improved witn adding the Progestin-only BCP.  No current pelvic pain.  Obesity post bariatric surgery.  Starting back on fitness with regular walking and low carb diet.   OB History  Gravida Para Term Preterm AB Living  5 2     2 2   SAB IAB Ectopic Multiple Live Births  2            # Outcome Date GA Lbr Len/2nd Weight Sex Delivery Anes PTL Lv  5 Gravida           4 SAB           3 SAB           2 Para           1 Para             Past medical history,surgical history, problem list, medications, allergies, family history and social history were all reviewed and documented in the EPIC chart.   Directed ROS with pertinent positives and negatives documented in the history of present illness/assessment and plan.  Exam:  Vitals:   03/02/20 0939  BP: 120/76   General appearance:  Normal  Gynecologic exam: Deferred   Assessment/Plan:  40 y.o. 30   1. 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  Last Colpo 08/2018 showed CIN 1.  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  Decision not to proceed with a Colposcopy at this time.  Will repeat a Pap test in 08/2020.    2. 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  3. Dysplasia of cervix, low grade (CIN 1) Colposcopy 08/2018.  4. Breakthrough bleeding associated with intrauterine device (IUD) BTB on Mirena IUD.  Now improved with added Progestin pill.  5. Class 3 severe obesity due to excess calories without serious comorbidity with body mass index (BMI) of 50.0 to 59.9 in adult [facility]) S/P Bariatric surgery.  [facility] MD, 10:06 AM 03/02/2020

## 2020-03-08 ENCOUNTER — Encounter: Payer: Self-pay | Admitting: Obstetrics & Gynecology

## 2020-03-16 ENCOUNTER — Telehealth: Admitting: Psychiatry

## 2020-03-28 ENCOUNTER — Telehealth: Payer: Self-pay

## 2020-03-28 DIAGNOSIS — F900 Attention-deficit hyperactivity disorder, predominantly inattentive type: Secondary | ICD-10-CM

## 2020-03-28 MED ORDER — DEXTROAMPHETAMINE SULFATE ER 10 MG PO CP24
30.0000 mg | ORAL_CAPSULE | Freq: Every day | ORAL | 0 refills | Status: DC
Start: 2020-03-28 — End: 2020-05-12

## 2020-03-28 NOTE — Telephone Encounter (Signed)
I have sent Dexedrine to pharmacy.

## 2020-03-28 NOTE — Telephone Encounter (Signed)
pt called states she needs a refill on the dextroamphetamine. she out    dextroamphetamine (DEXEDRINE SPANSULE) 10 MG 24 hr capsule Medication Date: 02/11/2020 Department: [facility] Ordering/Authorizing: Jomarie Longs, MD    Order Providers  Prescribing Provider Encounter Provider  Jomarie Longs, MD Jomarie Longs, MD   Outpatient Medication Detail   Disp Refills Start End   dextroamphetamine (DEXEDRINE SPANSULE) 10 MG 24 hr capsule 90 capsule 0 02/18/2020    Sig - Route: Take 3 capsules (30 mg total) by mouth daily. Start taking 20 mg daily AM and 10 mg daily in the afternoon - Oral   Sent to pharmacy as: dextroamphetamine (DEXEDRINE SPANSULE) 10 MG 24 hr capsule   Earliest Fill Date: 02/18/2020   Notes to Pharmacy: To be filled on or after 02/18/2020   E-Prescribing Status: Receipt confirmed by pharmacy (02/11/2020 10:26 AM EST)    Associated Diagnoses  Attention deficit hyperactivity disorder (ADHD), predominantly inattentive type      Pharmacy  [id number] - [place], [place] - [address]

## 2020-03-29 ENCOUNTER — Other Ambulatory Visit: Payer: Self-pay | Admitting: Psychiatry

## 2020-03-29 DIAGNOSIS — R251 Tremor, unspecified: Secondary | ICD-10-CM

## 2020-03-31 ENCOUNTER — Telehealth: Payer: Self-pay

## 2020-03-31 ENCOUNTER — Other Ambulatory Visit: Payer: Self-pay

## 2020-03-31 ENCOUNTER — Encounter: Payer: Self-pay | Admitting: Psychiatry

## 2020-03-31 ENCOUNTER — Telehealth (INDEPENDENT_AMBULATORY_CARE_PROVIDER_SITE_OTHER): Admitting: Psychiatry

## 2020-03-31 DIAGNOSIS — F3132 Bipolar disorder, current episode depressed, moderate: Secondary | ICD-10-CM | POA: Diagnosis not present

## 2020-03-31 DIAGNOSIS — Z9189 Other specified personal risk factors, not elsewhere classified: Secondary | ICD-10-CM

## 2020-03-31 DIAGNOSIS — F5105 Insomnia due to other mental disorder: Secondary | ICD-10-CM

## 2020-03-31 DIAGNOSIS — F411 Generalized anxiety disorder: Secondary | ICD-10-CM

## 2020-03-31 DIAGNOSIS — R251 Tremor, unspecified: Secondary | ICD-10-CM

## 2020-03-31 DIAGNOSIS — F900 Attention-deficit hyperactivity disorder, predominantly inattentive type: Secondary | ICD-10-CM

## 2020-03-31 DIAGNOSIS — F5081 Binge eating disorder: Secondary | ICD-10-CM

## 2020-03-31 MED ORDER — LURASIDONE HCL 60 MG PO TABS: 60.0000 mg | ORAL_TABLET | Freq: Every day | ORAL | 1 refills | Status: DC

## 2020-03-31 MED ORDER — GABAPENTIN 100 MG PO CAPS: 200.0000 mg | ORAL_CAPSULE | Freq: Every day | ORAL | 1 refills | Status: DC

## 2020-03-31 NOTE — Telephone Encounter (Signed)
faxed and confirmed ekg order sent to [facility]

## 2020-03-31 NOTE — Progress Notes (Signed)
Virtual Visit via Video Note  I connected with Sierra [name] on 03/31/20 at  8:30 AM EDT by a video enabled telemedicine application and verified that I am speaking with the correct person using two identifiers.  Location Provider Location : [facility] Patient Location : Home  Participants: Patient , Provider   I discussed the limitations of evaluation and management by telemedicine and the availability of in person appointments. The patient expressed understanding and agreed to proceed.   I discussed the assessment and treatment plan with the patient. The patient was provided an opportunity to ask questions and all were answered. The patient agreed with the plan and demonstrated an understanding of the instructions.   The patient was advised to call back or seek an in-person evaluation if the symptoms worsen or if the condition fails to improve as anticipated.   BH MD OP Progress Note  03/31/2020 9:17 AM SUSSIE MINOR  MRN:  [id number]  Chief Complaint:  Chief Complaint    Follow-up; Depression; ADD     HPI: Sierra [name] is a 40 year old Caucasian female, married, lives in [place], has a history of bipolar disorder, GAD, binge eating disorder, ADHD was evaluated by telemedicine today.  Patient today reports she continues to have depressive symptoms although she does not believe she is getting worse.  She feels her symptoms are kind of stable.  However she does not feel happy and does not know how to describe her current mood.  Patient reports she has been struggling with excessive dry mouth.  This may have started since starting the Cogentin.  She tried stopping it for a few days however did not feel it went away.  She has been back on it.  Her tremors are much better on the Cogentin.  She reports she is having some trouble swallowing at times.  This may have started even before she started the Cogentin.  She reports that there are times when she feels as though she  forgets how to swallow.  She has a history of bariatric surgery .  Patient denies any suicidality or homicidality.  Patient denies any perceptual disturbances.  She reports sleep is okay on the [place].  Patient is compliant on the Dexedrine.  Reports she is able to focus at school.  She got all A's recently.  Patient denies any binging on food and reports appetite is fair.  Patient denies any other concerns today.  Visit Diagnosis:    ICD-10-CM   1. Bipolar 1 disorder, depressed, moderate (HCC)  F31.32 Lurasidone HCl 60 MG TABS  2. GAD (generalized anxiety disorder)  F41.1 gabapentin (NEURONTIN) 100 MG capsule  3. Attention deficit hyperactivity disorder (ADHD), predominantly inattentive type  F90.0   4. Insomnia due to mental condition  F51.05   5. Binge eating disorder  F50.81   6. Tremor  R25.1   7. At risk for prolonged QT interval syndrome  Z91.89 EKG 12-Lead    Past Psychiatric History: I have reviewed past psychiatric history from my progress note on 06/20/2017  Past Medical History:  Past Medical History:  Diagnosis Date  . Abdominal pain, other specified site   . ADHD   . 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 Anemia   . Anxiety state, unspecified   . Bipolar 1 disorder, depressed (HCC) 05/21/2017  . BIPOLAR AFFECTIVE DISORDER 12/23/2006   Qualifier: Diagnosis of  By: Ermalene Searing MD, Amy    . Bipolar affective disorder (HCC) 10/12/2011  . Bipolar disorder, unspecified (HCC)   .  Depressive disorder, not elsewhere classified   . Edema   . Esophageal reflux   . Fibromyalgia   . Headache   . History of kidney stones   . Mild or unspecified pre-eclampsia, unspecified as to episode of care   . Morbid obesity (HCC)   . Myalgia and myositis, unspecified   . Other dyspnea and respiratory abnormality   . Preeclampsia    with first pregnancy  . Raynaud's disease   . Urinary tract infection, site not specified     Past Surgical History:  Procedure Laterality Date  .  CESAREAN SECTION     X 2  . CHOLECYSTECTOMY  03/2006  . CYSTOSCOPY W/ URETERAL STENT PLACEMENT Left 05/14/2016   Procedure: CYSTOSCOPY WITH STENT REPLACEMENT;  Surgeon: [name], MD;  Location: [facility];  Service: Urology;  Laterality: Left;  . CYSTOSCOPY WITH STENT PLACEMENT Left 04/25/2016   Procedure: CYSTOSCOPY WITH STENT PLACEMENT;  Surgeon: [name], MD;  Location: [facility];  Service: Urology;  Laterality: Left;  . DILATION AND CURETTAGE OF UTERUS    . IVC FILTER INSERTION  03/26/2016   [facility]  . LAPAROSCOPIC GASTRIC RESTRICTIVE DUODENAL PROCEDURE (DUODENAL SWITCH)  03/2016  . LITHOTRIPSY    . TONSILLECTOMY AND ADENOIDECTOMY    . URETEROSCOPY WITH HOLMIUM LASER LITHOTRIPSY Left 05/14/2016   Procedure: URETEROSCOPY WITH HOLMIUM LASER LITHOTRIPSY;  Surgeon: [name], MD;  Location: [facility];  Service: Urology;  Laterality: Left;    Family Psychiatric History: I have reviewed family psychiatric history from my progress note on 06/20/2017  Family History:  Family History  Problem Relation Age of Onset  . Depression Father   . Alcohol abuse Father   . Depression Mother   . Hyperlipidemia Mother   . Sleep apnea Mother   . Depression Sister   . Hyperlipidemia Brother   . Depression Brother   . Hyperlipidemia Brother   . Depression Brother   . Alcohol abuse Brother   . Coronary artery disease Maternal Grandmother   . Heart attack Maternal Grandmother   . Diabetes Maternal Grandmother   . Lung cancer Maternal Grandfather   . Emphysema Maternal Grandfather   . Coronary artery disease Maternal Grandfather   . Lupus Other        Aunt  . Fibromyalgia Other        Aunt    Social History: I have reviewed social history from my progress note on 06/20/2017 Social History   Socioeconomic History  . Marital status: Married    Spouse name: brian  . Number of children: 2  . Years of education: Not on file  . Highest education level: Some college, no degree   Occupational History  . Occupation: Home maker  Tobacco Use  . Smoking status: Never Smoker  . Smokeless tobacco: Never Used  Vaping Use  . Vaping Use: Never used  Substance and Sexual Activity  . Alcohol use: Yes    Alcohol/week: 0.0 standard drinks    Comment: occassional  . Drug use: No  . Sexual activity: Yes    Partners: Male    Birth control/protection: None, I.U.D.  Other Topics Concern  . Not on file  Social History Narrative   1 child, 2 step-sons      Regular exercise-no   Diet: no fast food, likes sweets   Social Determinants of Health   Financial Resource Strain: Not on file  Food Insecurity: Not on file  Transportation Needs: Not on file  Physical Activity: Not  on file  Stress: Not on file  Social Connections: Not on file    Allergies:  Allergies  Allergen Reactions  . Uncaria Tomentosa (Cats Claw) Other (See Comments)    allgeric to cats in general causes sneezing  . Bee Pollen Itching  . Molds & Smuts Other (See Comments)  . Pollen Extract   . Milk-Related Compounds     Able to tolerate yogurt; mild intolerance to milk/cheese  . Pineapple Rash  . Soy Allergy Diarrhea  . Strawberry Extract Rash    Break out on tongue noted Break out on tongue noted Break out on tongue noted    Metabolic Disorder Labs: Lab Results  Component Value Date   HGBA1C 4.7 (L) 05/22/2017   MPG 88.19 05/22/2017   Lab Results  Component Value Date   PROLACTIN 7.6 11/07/2018   Lab Results  Component Value Date   CHOL 105 05/22/2017   TRIG 41 05/22/2017   HDL 48 05/22/2017   CHOLHDL 2.2 05/22/2017   VLDL 8 05/22/2017   LDLCALC 49 05/22/2017   LDLCALC 99 09/11/2013   Lab Results  Component Value Date   TSH 0.504 05/21/2018   TSH 0.677 05/22/2017    Therapeutic Level Labs: Lab Results  Component Value Date   LITHIUM 0.10 (L) 02/03/2020   LITHIUM 0.18 (L) 08/13/2019   No results found for: VALPROATE No components found for:  CBMZ  Current  Medications: Current Outpatient Medications  Medication Sig Dispense Refill  . Lurasidone HCl 60 MG TABS Take 1 tablet (60 mg total) by mouth daily with breakfast. 30 tablet 1  . amoxicillin (AMOXIL) 875 MG tablet SMARTSIG:1 Tablet(s) By Mouth Every 12 Hours    . Biotin 5000 MCG TABS Take 1 tablet by mouth daily.    Marland Kitchen BLISOVI 24 FE 1-20 MG-MCG(24) tablet TAKE 1 TABLET BY MOUTH EVERY DAY 84 tablet 4  . buPROPion (WELLBUTRIN XL) 150 MG 24 hr tablet Take 1 tablet (150 mg total) by mouth daily. 90 tablet 0  . calcium citrate-vitamin D 500-400 MG-UNIT chewable tablet Chew 1 tablet by mouth daily.     . ciclopirox (PENLAC) 8 % solution Apply topically at bedtime.    . clonazePAM (KLONOPIN) 0.5 MG tablet Take 1 tablet (0.5 mg total) by mouth as directed. Take once every few days for severe panic attacks only. 10 tablet 0  . cyclobenzaprine (FLEXERIL) 10 MG tablet Take 10 mg by mouth 2 (two) times daily as needed for muscle spasms.    Marland Kitchen dextroamphetamine (DEXEDRINE SPANSULE) 10 MG 24 hr capsule Take 3 capsules (30 mg total) by mouth daily. Start taking 20 mg daily AM and 10 mg daily in the afternoon 90 capsule 0  . docusate sodium (COLACE) 100 MG capsule Take 300 mg by mouth daily.    . ferrous sulfate 325 (65 FE) MG tablet Take 325 mg by mouth daily with breakfast.    . fluconazole (DIFLUCAN) 150 MG tablet Take 150 mg by mouth daily.    Marland Kitchen gabapentin (NEURONTIN) 100 MG capsule Take 2 capsules (200 mg total) by mouth at bedtime. 360 capsule 1  . hydrochlorothiazide (HYDRODIURIL) 25 MG tablet Take 25 mg by mouth daily as needed.    . hydrOXYzine (ATARAX/VISTARIL) 25 MG tablet TAKE 1 TABLET (25 MG TOTAL) BY MOUTH DAILY AS NEEDED FOR ANXIETY. 90 tablet 1  . lamoTRIgine (LAMICTAL) 100 MG tablet Take 1 tablet (100 mg total) by mouth daily. 90 tablet 0  . lamoTRIgine (LAMICTAL) 25 MG tablet Take  2 tablets (50 mg total) by mouth daily. To be added to 100 mg daily 180 tablet 0  . levonorgestrel (MIRENA) 20  MCG/24HR IUD 1 each by Intrauterine route once.    . lithium carbonate (LITHOBID) 300 MG CR tablet TAKE 1 TABLET BY MOUTH EVERY DAY WITH SUPPER 90 tablet 0  . Milnacipran (SAVELLA) 50 MG TABS tablet Take 50 mg by mouth 2 (two) times daily. Takes differently    . montelukast (SINGULAIR) 10 MG tablet Take 10 mg by mouth daily.    . Multiple Vitamin (MULTIVITAMIN) tablet Take 1 tablet by mouth daily.    Marland Kitchen NIFEdipine (PROCARDIA-XL/ADALAT CC) 60 MG 24 hr tablet Take 60 mg by mouth daily.     Marland Kitchen omeprazole (PRILOSEC) 20 MG capsule Take 20 mg by mouth daily.    . traMADol-acetaminophen (ULTRACET) 37.5-325 MG tablet Take 1 tablet by mouth daily as needed.    . zaleplon (SONATA) 10 MG capsule Take 1 capsule (10 mg total) by mouth at bedtime as needed. 30 capsule 2   No current facility-administered medications for this visit.     Musculoskeletal: Strength & Muscle Tone: UTA Gait & Station: UTA Patient leans: N/A  Psychiatric Specialty Exam: Review of Systems  Neurological: Positive for tremors.  Psychiatric/Behavioral: Positive for dysphoric mood.  All other systems reviewed and are negative.   There were no vitals taken for this visit.There is no height or weight on file to calculate BMI.  General Appearance: Casual  Eye Contact:  Fair  Speech:  Clear and Coherent  Volume:  Normal  Mood:  Does not feel happy , does not know how to describe  Affect:  Congruent  Thought Process:  Goal Directed and Descriptions of Associations: Intact  Orientation:  Full (Time, Place, and Person)  Thought Content: Logical   Suicidal Thoughts:  No  Homicidal Thoughts:  No  Memory:  Immediate;   Fair Recent;   Fair Remote;   Fair  Judgement:  Fair  Insight:  Fair  Psychomotor Activity:  Normal  Concentration:  Concentration: Fair and Attention Span: Fair  Recall:  [employer] of Knowledge: Fair  Language: Fair  Akathisia:  No  Handed:  Right  AIMS (if indicated): UTA  Assets:  Communication  Skills Desire for Improvement Housing Social Support  ADL's:  Intact  Cognition: WNL  Sleep:  Fair   Screenings: AIMS   Flowsheet Row Video Visit from 03/01/2020 in [facility] Office Visit from 12/11/2017 in [facility] Office Visit from 11/29/2017 in [facility] Office Visit from 11/20/2017 in [facility] Admission (Discharged) from 05/21/2017 in [facility]  AIMS Total Score 8 1 7 5  0    AUDIT   Flowsheet Row Admission (Discharged) from 05/21/2017 in [facility]  Alcohol Use Disorder Identification Test Final Score (AUDIT) 6    PHQ2-9   Flowsheet Row Video Visit from 03/01/2020 in [facility] Video Visit from 02/11/2020 in [facility]  PHQ-2 Total Score 4 5  PHQ-9 Total Score 14 16    Flowsheet Row Video Visit from 03/01/2020 in [facility]  C-SSRS RISK CATEGORY No Risk       Assessment and Plan: YANIQUE MULVIHILL is a 40 year old Caucasian female who has a history of bipolar disorder, anxiety disorder, binge eating disorder was evaluated by telemedicine today.  Patient is currently making some progress on the 30 however does report  dry mouth as well as GI symptoms.  She will benefit from the following plan.  Plan Bipolar disorder depressed-unstable Increase Latuda to 60 mg p.o. daily with breakfast Lamictal 150 mg p.o. daily Lithium 300 mg p.o. daily at reduced dosage-dosage previously reduced due to tremors Lithium level 02/03/2020-0.10-subtherapeutic Wellbutrin as prescribed.  Will consider tapering her off of Wellbutrin in the future.  Anxiety disorder-improving Gabapentin 200 mg at bedtime.  She is on a lower dosage than what was prescribed. Hydroxyzine as needed Patient advised to restart CBT.  ADHD-improving Dexedrine 30 mg  p.o. daily  Binge eating disorder-stable Will monitor closely  Tremors-improving Will discontinue Cogentin due to side effects of dry mouth. We will consider referral to neurology in the future.  High risk medication use-I have reviewed the following labs received from her primary care provider dated 02/04/2020-CMP-within normal limits, lipid panel-within normal limits, TSH-1.608-within normal limits, T4 total-elevated 12.39, free T4-elevated 1.22, T3 uptake-elevated 13.7 Vitamin D-31.3-low borderline.,  CBC with differential-within normal limits, vitamin B12-elevated at 1506, folate-within normal limits at 19.5.  At risk for prolonged QT syndrome-pending EKG.  Patient was supposed to get it at her primary care provider office.  Will order EKG today and [facility].  She agrees to get it done.  Patient advised to follow-up with primary care provider for her recent problems swallowing.  Follow-up in clinic in 2 to 3 weeks or sooner if needed. I have spent atleast 30 minutes with patient today which includes the time spent for preparing to see the patient ( e.g., review of test, records ), obtaining and to review and separately obtained history , ordering medications and test ,psychoeducation and supportive psychotherapy and care coordination,as well as documenting clinical information in electronic health record,interpreting and communication of test results  This note was generated in part or whole with voice recognition software. Voice recognition is usually quite accurate but there are transcription errors that can and very often do occur. I apologize for any typographical errors that were not detected and corrected.      Jomarie LongsSaramma Ferman Basilio, MD 04/01/2020, 8:17 AM

## 2020-04-04 ENCOUNTER — Other Ambulatory Visit: Payer: Self-pay

## 2020-04-04 ENCOUNTER — Ambulatory Visit
Admission: RE | Admit: 2020-04-04 | Discharge: 2020-04-04 | Disposition: A | Discharge status: Home / Self Care | Source: Ambulatory Visit | Attending: Family Medicine | Admitting: Family Medicine

## 2020-04-04 DIAGNOSIS — Z5181 Encounter for therapeutic drug level monitoring: Secondary | ICD-10-CM | POA: Diagnosis not present

## 2020-04-22 ENCOUNTER — Telehealth (INDEPENDENT_AMBULATORY_CARE_PROVIDER_SITE_OTHER): Admitting: Psychiatry

## 2020-04-22 ENCOUNTER — Other Ambulatory Visit: Payer: Self-pay

## 2020-04-22 ENCOUNTER — Encounter: Payer: Self-pay | Admitting: Psychiatry

## 2020-04-22 DIAGNOSIS — F5081 Binge eating disorder: Secondary | ICD-10-CM

## 2020-04-22 DIAGNOSIS — F900 Attention-deficit hyperactivity disorder, predominantly inattentive type: Secondary | ICD-10-CM | POA: Diagnosis not present

## 2020-04-22 DIAGNOSIS — Z9189 Other specified personal risk factors, not elsewhere classified: Secondary | ICD-10-CM

## 2020-04-22 DIAGNOSIS — F5105 Insomnia due to other mental disorder: Secondary | ICD-10-CM | POA: Diagnosis not present

## 2020-04-22 DIAGNOSIS — F3175 Bipolar disorder, in partial remission, most recent episode depressed: Secondary | ICD-10-CM | POA: Diagnosis not present

## 2020-04-22 DIAGNOSIS — F411 Generalized anxiety disorder: Secondary | ICD-10-CM | POA: Diagnosis not present

## 2020-04-22 MED ORDER — GABAPENTIN 100 MG PO CAPS: 400.0000 mg | ORAL_CAPSULE | Freq: Every day | ORAL | 1 refills | Status: DC

## 2020-04-22 MED ORDER — BUPROPION HCL ER (XL) 150 MG PO TB24: 150.0000 mg | ORAL_TABLET | ORAL | 0 refills | Status: DC

## 2020-04-22 NOTE — Progress Notes (Signed)
Virtual Visit via Video Note  I connected with Sierra [name] on 04/22/20 at  9:00 AM EDT by a video enabled telemedicine application and verified that I am speaking with the correct person using two identifiers.  Location Provider Location : [facility] Patient Location : Home  Participants: Patient , Provider    I discussed the limitations of evaluation and management by telemedicine and the availability of in person appointments. The patient expressed understanding and agreed to proceed.    I discussed the assessment and treatment plan with the patient. The patient was provided an opportunity to ask questions and all were answered. The patient agreed with the plan and demonstrated an understanding of the instructions.   The patient was advised to call back or seek an in-person evaluation if the symptoms worsen or if the condition fails to improve as anticipated.  BH MD OP Progress Note  04/22/2020 12:13 PM Sierra [name]  MRN:  [id number]  Chief Complaint:  Chief Complaint    Follow-up; Anxiety; Depression; ADHD     HPI: Sierra [name] is a 40 year old Caucasian female, married, lives in [place], has a history of bipolar disorder, GAD, binge eating disorder, ADHD was evaluated by telemedicine today.  Patient today reports she is not depressed, denies any manic or hypomanic symptoms like racing thoughts, euphoria, high energy, mood swings.  She however reports she has been struggling with sleep since the past 1 week.  She has difficulty falling asleep and her sleep is interrupted throughout the night.  She does have back pain which has been going on for the past several years.  She does not know if that is causing the sleep problems or not.  She however reports back pain has been exacerbated likely from lack of sleep also.  She does have Flexeril, Motrin available.  She also was prescribed tramadol which she does not use much.   She continues to have problems with swallowing,  forgets to swallow sometimes per her report.  She is currently following up with her primary care provider.  She also has uterine bleeding and may need a hysterectomy in the future.  She however reports that does not worry her much and she has good social support system.  Patient reports she is compliant on her medications.  Takes the Dexedrine once in the morning and most of the time does not take the afternoon dose since the morning dose is enough for her.  She has does not believe the Dexedrine is causing her sleep problems.  Patient denies any suicidality, homicidality or perceptual disturbances.  Patient denies any other concerns today.    Visit Diagnosis:    ICD-10-CM   1. Bipolar disorder, in partial remission, most recent episode depressed (HCC)  F31.75   2. GAD (generalized anxiety disorder)  F41.1 buPROPion (WELLBUTRIN XL) 150 MG 24 hr tablet    gabapentin (NEURONTIN) 100 MG capsule  3. Attention deficit hyperactivity disorder (ADHD), predominantly inattentive type  F90.0   4. Insomnia due to mental condition  F51.05   5. Binge eating disorder  F50.81   6. At risk for prolonged QT interval syndrome  Z91.89     Past Psychiatric History: I have reviewed past psychiatric history from progress note on 06/20/2017  Past Medical History:  Past Medical History:  Diagnosis Date  . Abdominal pain, other specified site   . ADHD   . 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 Anemia   . Anxiety state, unspecified   .  Bipolar 1 disorder, depressed (HCC) 05/21/2017  . BIPOLAR AFFECTIVE DISORDER 12/23/2006   Qualifier: Diagnosis of  By: Ermalene Searing MD, Amy    . Bipolar affective disorder (HCC) 10/12/2011  . Bipolar disorder, unspecified (HCC)   . Depressive disorder, not elsewhere classified   . Edema   . Esophageal reflux   . Fibromyalgia   . Headache   . History of kidney stones   . Mild or unspecified pre-eclampsia, unspecified as to episode of care   . Morbid obesity (HCC)   .  Myalgia and myositis, unspecified   . Other dyspnea and respiratory abnormality   . Preeclampsia    with first pregnancy  . Raynaud's disease   . Urinary tract infection, site not specified     Past Surgical History:  Procedure Laterality Date  . CESAREAN SECTION     X 2  . CHOLECYSTECTOMY  03/2006  . CYSTOSCOPY W/ URETERAL STENT PLACEMENT Left 05/14/2016   Procedure: CYSTOSCOPY WITH STENT REPLACEMENT;  Surgeon: [name], MD;  Location: [facility];  Service: Urology;  Laterality: Left;  . CYSTOSCOPY WITH STENT PLACEMENT Left 04/25/2016   Procedure: CYSTOSCOPY WITH STENT PLACEMENT;  Surgeon: [name], MD;  Location: [facility];  Service: Urology;  Laterality: Left;  . DILATION AND CURETTAGE OF UTERUS    . IVC FILTER INSERTION  03/26/2016   [facility]  . LAPAROSCOPIC GASTRIC RESTRICTIVE DUODENAL PROCEDURE (DUODENAL SWITCH)  03/2016  . LITHOTRIPSY    . TONSILLECTOMY AND ADENOIDECTOMY    . URETEROSCOPY WITH HOLMIUM LASER LITHOTRIPSY Left 05/14/2016   Procedure: URETEROSCOPY WITH HOLMIUM LASER LITHOTRIPSY;  Surgeon: [name], MD;  Location: [facility];  Service: Urology;  Laterality: Left;    Family Psychiatric History: I have reviewed family psychiatric history from my progress note on 06/20/2017  Family History:  Family History  Problem Relation Age of Onset  . Depression Father   . Alcohol abuse Father   . Depression Mother   . Hyperlipidemia Mother   . Sleep apnea Mother   . Depression Sister   . Hyperlipidemia Brother   . Depression Brother   . Hyperlipidemia Brother   . Depression Brother   . Alcohol abuse Brother   . Coronary artery disease Maternal Grandmother   . Heart attack Maternal Grandmother   . Diabetes Maternal Grandmother   . Lung cancer Maternal Grandfather   . Emphysema Maternal Grandfather   . Coronary artery disease Maternal Grandfather   . Lupus Other        Aunt  . Fibromyalgia Other        Aunt    Social History: I have reviewed social  history from my progress note on 06/20/2017 Social History   Socioeconomic History  . Marital status: Married    Spouse name: brian  . Number of children: 2  . Years of education: Not on file  . Highest education level: Some college, no degree  Occupational History  . Occupation: Home maker  Tobacco Use  . Smoking status: Never Smoker  . Smokeless tobacco: Never Used  Vaping Use  . Vaping Use: Never used  Substance and Sexual Activity  . Alcohol use: Yes    Alcohol/week: 0.0 standard drinks    Comment: occassional  . Drug use: No  . Sexual activity: Yes    Partners: Male    Birth control/protection: None, I.U.D.  Other Topics Concern  . Not on file  Social History Narrative   1 child, 2 step-sons  Regular exercise-no   Diet: no fast food, likes sweets   Social Determinants of Health   Financial Resource Strain: Not on file  Food Insecurity: Not on file  Transportation Needs: Not on file  Physical Activity: Not on file  Stress: Not on file  Social Connections: Not on file    Allergies:  Allergies  Allergen Reactions  . Uncaria Tomentosa (Cats Claw) Other (See Comments)    allgeric to cats in general causes sneezing  . Bee Pollen Itching  . Molds & Smuts Other (See Comments)  . Pollen Extract   . Milk-Related Compounds     Able to tolerate yogurt; mild intolerance to milk/cheese  . Pineapple Rash  . Soy Allergy Diarrhea  . Strawberry Extract Rash    Break out on tongue noted Break out on tongue noted Break out on tongue noted    Metabolic Disorder Labs: Lab Results  Component Value Date   HGBA1C 4.7 (L) 05/22/2017   MPG 88.19 05/22/2017   Lab Results  Component Value Date   PROLACTIN 7.6 11/07/2018   Lab Results  Component Value Date   CHOL 105 05/22/2017   TRIG 41 05/22/2017   HDL 48 05/22/2017   CHOLHDL 2.2 05/22/2017   VLDL 8 05/22/2017   LDLCALC 49 05/22/2017   LDLCALC 99 09/11/2013   Lab Results  Component Value Date   TSH 0.504  05/21/2018   TSH 0.677 05/22/2017    Therapeutic Level Labs: Lab Results  Component Value Date   LITHIUM 0.10 (L) 02/03/2020   LITHIUM 0.18 (L) 08/13/2019   No results found for: VALPROATE No components found for:  CBMZ  Current Medications: Current Outpatient Medications  Medication Sig Dispense Refill  . amoxicillin (AMOXIL) 875 MG tablet SMARTSIG:1 Tablet(s) By Mouth Every 12 Hours    . Biotin 5000 MCG TABS Take 1 tablet by mouth daily.    Marland Kitchen BLISOVI 24 FE 1-20 MG-MCG(24) tablet TAKE 1 TABLET BY MOUTH EVERY DAY 84 tablet 4  . buPROPion (WELLBUTRIN XL) 150 MG 24 hr tablet Take 1 tablet (150 mg total) by mouth as directed. Take every other day for 10 days and stop 90 tablet 0  . calcium citrate-vitamin D 500-400 MG-UNIT chewable tablet Chew 1 tablet by mouth daily.     . ciclopirox (PENLAC) 8 % solution Apply topically at bedtime.    . clonazePAM (KLONOPIN) 0.5 MG tablet Take 1 tablet (0.5 mg total) by mouth as directed. Take once every few days for severe panic attacks only. 10 tablet 0  . cyclobenzaprine (FLEXERIL) 10 MG tablet Take 10 mg by mouth 2 (two) times daily as needed for muscle spasms.    Marland Kitchen dextroamphetamine (DEXEDRINE SPANSULE) 10 MG 24 hr capsule Take 3 capsules (30 mg total) by mouth daily. Start taking 20 mg daily AM and 10 mg daily in the afternoon 90 capsule 0  . docusate sodium (COLACE) 100 MG capsule Take 300 mg by mouth daily.    . ferrous sulfate 325 (65 FE) MG tablet Take 325 mg by mouth daily with breakfast.    . fluconazole (DIFLUCAN) 150 MG tablet Take 150 mg by mouth daily.    Marland Kitchen gabapentin (NEURONTIN) 100 MG capsule Take 4 capsules (400 mg total) by mouth at bedtime. 360 capsule 1  . hydrochlorothiazide (HYDRODIURIL) 25 MG tablet Take 25 mg by mouth daily as needed.    . hydrOXYzine (ATARAX/VISTARIL) 25 MG tablet TAKE 1 TABLET (25 MG TOTAL) BY MOUTH DAILY AS NEEDED FOR ANXIETY. 90  tablet 1  . lamoTRIgine (LAMICTAL) 100 MG tablet Take 1 tablet (100 mg total)  by mouth daily. 90 tablet 0  . lamoTRIgine (LAMICTAL) 25 MG tablet Take 2 tablets (50 mg total) by mouth daily. To be added to 100 mg daily 180 tablet 0  . levonorgestrel (MIRENA) 20 MCG/24HR IUD 1 each by Intrauterine route once.    . lithium carbonate (LITHOBID) 300 MG CR tablet TAKE 1 TABLET BY MOUTH EVERY DAY WITH SUPPER 90 tablet 0  . Lurasidone HCl 60 MG TABS Take 1 tablet (60 mg total) by mouth daily with breakfast. 30 tablet 1  . Milnacipran (SAVELLA) 50 MG TABS tablet Take 50 mg by mouth 2 (two) times daily. Takes differently    . montelukast (SINGULAIR) 10 MG tablet Take 10 mg by mouth daily.    . Multiple Vitamin (MULTIVITAMIN) tablet Take 1 tablet by mouth daily.    Marland Kitchen NIFEdipine (PROCARDIA-XL/ADALAT CC) 60 MG 24 hr tablet Take 60 mg by mouth daily.     Marland Kitchen omeprazole (PRILOSEC) 20 MG capsule Take 20 mg by mouth daily.    . traMADol-acetaminophen (ULTRACET) 37.5-325 MG tablet Take 1 tablet by mouth daily as needed.    . zaleplon (SONATA) 10 MG capsule Take 1 capsule (10 mg total) by mouth at bedtime as needed. 30 capsule 2   No current facility-administered medications for this visit.     Musculoskeletal: Strength & Muscle Tone: UTA Gait & Station: UTA Patient leans: N/A  Psychiatric Specialty Exam: Review of Systems  HENT:       Swallowing problems  Musculoskeletal: Positive for back pain.  Psychiatric/Behavioral: Positive for sleep disturbance. The patient is nervous/anxious.   All other systems reviewed and are negative.   There were no vitals taken for this visit.There is no height or weight on file to calculate BMI.  General Appearance: Casual  Eye Contact:  Fair  Speech:  Clear and Coherent  Volume:  Normal  Mood:  Anxious  Affect:  Appropriate  Thought Process:  Goal Directed and Descriptions of Associations: Intact  Orientation:  Full (Time, Place, and Person)  Thought Content: Logical   Suicidal Thoughts:  No  Homicidal Thoughts:  No  Memory:  Immediate;    Fair Recent;   Fair Remote;   Fair  Judgement:  Fair  Insight:  Fair  Psychomotor Activity:  Normal  Concentration:  Concentration: Fair and Attention Span: Fair  Recall:  [employer] of Knowledge: Fair  Language: Fair  Akathisia:  No  Handed:  Right  AIMS (if indicated): UTA  Assets:  Communication Skills Desire for Improvement Housing Social Support Talents/Skills  ADL's:  Intact  Cognition: WNL  Sleep:  Poor   Screenings: AIMS   Flowsheet Row Video Visit from 03/01/2020 in [facility] Office Visit from 12/11/2017 in [facility] Office Visit from 11/29/2017 in [facility] Office Visit from 11/20/2017 in [facility] Admission (Discharged) from 05/21/2017 in [facility]  AIMS Total Score 0    AUDIT   Flowsheet Row Admission (Discharged) from 05/21/2017 in [facility]ICINE  Alcohol Use Disorder Identification Test Final Score (AUDIT) 6    PHQ2-9   Flowsheet Row Video Visit from 03/01/2020 in [facility] Video Visit from 02/11/2020 in [facility]  PHQ-2 Total Score 4 5  PHQ-9 Total Score 14 16    Flowsheet Row Video Visit from 03/01/2020 in  [facility]  C-SSRS RISK CATEGORY No Risk       Assessment and Plan: Sierra [name] is a 40 year old Caucasian female who has a history of bipolar disorder, anxiety disorder, binge eating disorder was evaluated by telemedicine today.  Patient is currently struggling with sleep, continues to have swallowing problems as well as possibly needs a hysterectomy.  Plan as noted below.  Plan Bipolar disorder depressed-improving Latuda 60 mg p.o. daily with breakfast Lamictal 150 mg p.o. daily Lithium 300 mg p.o. daily at reduced dosage.  Dose reduced due to tremors. Lithium  level-02/03/2020-0.10-subtherapeutic Taper off Wellbutrin-patient advised take Wellbutrin XL 150 mg p.o. daily every other day for 10 days and stop taking it Patient with sleep problems likely multifactorial. Increase gabapentin to 400 mg p.o. nightly Continue Sonata 10 mg p.o. nightly as needed. Patient will also benefit from sufficient pain management since pain could also be contributing to sleep problems. Discussed with patient to start exercise routine on a regular basis.  Anxiety disorder-improving Gabapentin as prescribed Hydroxyzine as needed Patient was advised to start CBT last visit-patient reports she is currently looking for a therapist however has not been successful in finding one yet.  She agrees to establish care with a therapist as soon as possible.  ADHD-improving Dexedrine 30 mg p.o. daily  Binge eating disorder-stable We will monitor closely  At risk for prolonged QT syndrome-EKG reviewed and discussed dated April 04, 2020-sinus tachycardia.  Septal infarct age undetermined versus lead placement.  Patient advised to continue to follow-up with primary care provider for her EKG abnormalities.  Follow-up in clinic in 3 to 4 weeks or sooner if needed.  This note was generated in part or whole with voice recognition software. Voice recognition is usually quite accurate but there are transcription errors that can and very often do occur. I apologize for any typographical errors that were not detected and corrected.        Jomarie Longs, MD 04/22/2020, 12:13 PM

## 2020-05-03 ENCOUNTER — Inpatient Hospital Stay

## 2020-05-05 ENCOUNTER — Inpatient Hospital Stay: Admitting: Oncology

## 2020-05-12 ENCOUNTER — Encounter: Payer: Self-pay | Admitting: Psychiatry

## 2020-05-12 ENCOUNTER — Other Ambulatory Visit: Payer: Self-pay

## 2020-05-12 ENCOUNTER — Telehealth (INDEPENDENT_AMBULATORY_CARE_PROVIDER_SITE_OTHER): Admitting: Psychiatry

## 2020-05-12 DIAGNOSIS — F900 Attention-deficit hyperactivity disorder, predominantly inattentive type: Secondary | ICD-10-CM | POA: Diagnosis not present

## 2020-05-12 DIAGNOSIS — F5081 Binge eating disorder: Secondary | ICD-10-CM

## 2020-05-12 DIAGNOSIS — F3131 Bipolar disorder, current episode depressed, mild: Secondary | ICD-10-CM

## 2020-05-12 DIAGNOSIS — F50819 Binge eating disorder, unspecified: Secondary | ICD-10-CM

## 2020-05-12 DIAGNOSIS — F411 Generalized anxiety disorder: Secondary | ICD-10-CM

## 2020-05-12 DIAGNOSIS — F5105 Insomnia due to other mental disorder: Secondary | ICD-10-CM

## 2020-05-12 MED ORDER — DEXTROAMPHETAMINE SULFATE ER 10 MG PO CP24: 30.0000 mg | ORAL_CAPSULE | Freq: Every day | ORAL | 0 refills | Status: DC

## 2020-05-12 MED ORDER — LURASIDONE HCL 80 MG PO TABS: 80.0000 mg | ORAL_TABLET | Freq: Every day | ORAL | 1 refills | Status: DC

## 2020-05-12 MED ORDER — HYDROXYZINE HCL 25 MG PO TABS: 25.0000 mg | ORAL_TABLET | Freq: Every day | ORAL | 1 refills | Status: DC | PRN

## 2020-05-12 MED ORDER — LURASIDONE HCL 80 MG PO TABS: 80.0000 mg | ORAL_TABLET | Freq: Every day | ORAL | 0 refills | Status: DC

## 2020-05-12 NOTE — Progress Notes (Signed)
Virtual Visit via Video Note  I connected with Sierra [name] on 05/12/20 at  1:30 PM EDT by a video enabled telemedicine application and verified that I am speaking with the correct person using two identifiers.  Location Provider Location : [facility] Patient Location : Home  Participants: Patient , Provider     I discussed the limitations of evaluation and management by telemedicine and the availability of in person appointments. The patient expressed understanding and agreed to proceed.   I discussed the assessment and treatment plan with the patient. The patient was provided an opportunity to ask questions and all were answered. The patient agreed with the plan and demonstrated an understanding of the instructions.   The patient was advised to call back or seek an in-person evaluation if the symptoms worsen or if the condition fails to improve as anticipated.   BH MD OP Progress Note  05/12/2020 2:05 PM Sierra [name]  MRN:  [id number]  Chief Complaint:  Chief Complaint    Follow-up; ADHD; Depression     HPI: Sierra [name] is a 40 year old Caucasian female, married, lives in [place], has a history of bipolar disorder, GAD, binge eating disorder, ADHD was evaluated by telemedicine today.  Patient today reports since the past 3 weeks she has noticed her irritability to be getting worse.  She reports she has never felt this way before.  She has stopped driving since she is worried about road rage.  She reports she has road range anyway and she did not want to take a risk.  She reports she does not know what could be contributing to this.  She reports her pain is more manageable.  She reports sleep is better.  She had stopped taking the afternoon dose of Dexedrine since her sleep was not good and once her sleep got better she restarted taking it.  And that has worked out well.  Patient reports she came off of the Wellbutrin.  Unknown if this is contributing to it.  She  denies any suicidality, homicidality or perceptual disturbances.  Patient is compliant on medications.   Patient reports she does have upcoming exams coming up on Monday.  She however believes her irritability is not related to that since she is not very worried about her exams per her report.  Patient denies any other concerns today.  Visit Diagnosis:    ICD-10-CM   1. Bipolar 1 disorder, depressed, mild (HCC)  F31.31 lurasidone (LATUDA) 80 MG TABS tablet    lurasidone (LATUDA) 80 MG TABS tablet  2. GAD (generalized anxiety disorder)  F41.1 hydrOXYzine (ATARAX/VISTARIL) 25 MG tablet  3. Attention deficit hyperactivity disorder (ADHD), predominantly inattentive type  F90.0 dextroamphetamine (DEXEDRINE SPANSULE) 10 MG 24 hr capsule  4. Insomnia due to mental condition  F51.05   5. Binge eating disorder  F50.81     Past Psychiatric History: I have reviewed past psychiatric history from progress note on 06/20/2017  Past Medical History:  Past Medical History:  Diagnosis Date  . Abdominal pain, other specified site   . ADHD   . 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 Anemia   . Anxiety state, unspecified   . Bipolar 1 disorder, depressed (HCC) 05/21/2017  . BIPOLAR AFFECTIVE DISORDER 12/23/2006   Qualifier: Diagnosis of  By: Ermalene Searing MD, Amy    . Bipolar affective disorder (HCC) 10/12/2011  . Bipolar disorder, unspecified (HCC)   . Depressive disorder, not elsewhere classified   . Edema   . Esophageal reflux   .  Fibromyalgia   . Headache   . History of kidney stones   . Mild or unspecified pre-eclampsia, unspecified as to episode of care   . Morbid obesity (HCC)   . Myalgia and myositis, unspecified   . Other dyspnea and respiratory abnormality   . Preeclampsia    with first pregnancy  . Raynaud's disease   . Urinary tract infection, site not specified     Past Surgical History:  Procedure Laterality Date  . CESAREAN SECTION     X 2  . CHOLECYSTECTOMY  03/2006  .  CYSTOSCOPY W/ URETERAL STENT PLACEMENT Left 05/14/2016   Procedure: CYSTOSCOPY WITH STENT REPLACEMENT;  Surgeon: Vanna ScotlandAshley Brandon, MD;  Location: [facility];  Service: Urology;  Laterality: Left;  . CYSTOSCOPY WITH STENT PLACEMENT Left 04/25/2016   Procedure: CYSTOSCOPY WITH STENT PLACEMENT;  Surgeon: Vanna ScotlandAshley Brandon, MD;  Location: [facility];  Service: Urology;  Laterality: Left;  . DILATION AND CURETTAGE OF UTERUS    . IVC FILTER INSERTION  03/26/2016   [facility]  . LAPAROSCOPIC GASTRIC RESTRICTIVE DUODENAL PROCEDURE (DUODENAL SWITCH)  03/2016  . LITHOTRIPSY    . TONSILLECTOMY AND ADENOIDECTOMY    . URETEROSCOPY WITH HOLMIUM LASER LITHOTRIPSY Left 05/14/2016   Procedure: URETEROSCOPY WITH HOLMIUM LASER LITHOTRIPSY;  Surgeon: Vanna ScotlandAshley Brandon, MD;  Location: [facility];  Service: Urology;  Laterality: Left;    Family Psychiatric History: I have reviewed family psychiatric history from my progress note on 06/20/2017  Family History:  Family History  Problem Relation Age of Onset  . Depression Father   . Alcohol abuse Father   . Depression Mother   . Hyperlipidemia Mother   . Sleep apnea Mother   . Depression Sister   . Hyperlipidemia Brother   . Depression Brother   . Hyperlipidemia Brother   . Depression Brother   . Alcohol abuse Brother   . Coronary artery disease Maternal Grandmother   . Heart attack Maternal Grandmother   . Diabetes Maternal Grandmother   . Lung cancer Maternal Grandfather   . Emphysema Maternal Grandfather   . Coronary artery disease Maternal Grandfather   . Lupus Other        Aunt  . Fibromyalgia Other        Aunt    Social History: I have reviewed social history from my progress note on 06/20/2017 Social History   Socioeconomic History  . Marital status: Married    Spouse name: brian  . Number of children: 2  . Years of education: Not on file  . Highest education level: Some college, no degree  Occupational History  . Occupation: Home maker  Tobacco Use   . Smoking status: Never Smoker  . Smokeless tobacco: Never Used  Vaping Use  . Vaping Use: Never used  Substance and Sexual Activity  . Alcohol use: Yes    Alcohol/week: 0.0 standard drinks    Comment: occassional  . Drug use: No  . Sexual activity: Yes    Partners: Male    Birth control/protection: None, I.U.D.  Other Topics Concern  . Not on file  Social History Narrative   1 child, 2 step-sons      Regular exercise-no   Diet: no fast food, likes sweets   Social Determinants of Health   Financial Resource Strain: Not on file  Food Insecurity: Not on file  Transportation Needs: Not on file  Physical Activity: Not on file  Stress: Not on file  Social Connections: Not on file    Allergies:  Allergies  Allergen Reactions  . Uncaria Tomentosa (Cats Claw) Other (See Comments)    allgeric to cats in general causes sneezing  . Bee Pollen Itching  . Molds & Smuts Other (See Comments)  . Pollen Extract   . Milk-Related Compounds     Able to tolerate yogurt; mild intolerance to milk/cheese  . Pineapple Rash  . Soy Allergy Diarrhea  . Strawberry Extract Rash    Break out on tongue noted Break out on tongue noted Break out on tongue noted    Metabolic Disorder Labs: Lab Results  Component Value Date   HGBA1C 4.7 (L) 05/22/2017   MPG 88.19 05/22/2017   Lab Results  Component Value Date   PROLACTIN 7.6 11/07/2018   Lab Results  Component Value Date   CHOL 105 05/22/2017   TRIG 41 05/22/2017   HDL 48 05/22/2017   CHOLHDL 2.2 05/22/2017   VLDL 8 05/22/2017   LDLCALC 49 05/22/2017   LDLCALC 99 09/11/2013   Lab Results  Component Value Date   TSH 0.504 05/21/2018   TSH 0.677 05/22/2017    Therapeutic Level Labs: Lab Results  Component Value Date   LITHIUM 0.10 (L) 02/03/2020   LITHIUM 0.18 (L) 08/13/2019   No results found for: VALPROATE No components found for:  CBMZ  Current Medications: Current Outpatient Medications  Medication Sig Dispense  Refill  . lurasidone (LATUDA) 80 MG TABS tablet Take 1 tablet (80 mg total) by mouth daily with supper. 30 tablet 1  . lurasidone (LATUDA) 80 MG TABS tablet Take 1 tablet (80 mg total) by mouth daily with supper. 15 tablet 0  . amoxicillin (AMOXIL) 875 MG tablet SMARTSIG:1 Tablet(s) By Mouth Every 12 Hours    . Biotin 5000 MCG TABS Take 1 tablet by mouth daily.    Marland Kitchen BLISOVI 24 FE 1-20 MG-MCG(24) tablet TAKE 1 TABLET BY MOUTH EVERY DAY 84 tablet 4  . calcium citrate-vitamin D 500-400 MG-UNIT chewable tablet Chew 1 tablet by mouth daily.     . ciclopirox (PENLAC) 8 % solution Apply topically at bedtime.    . clonazePAM (KLONOPIN) 0.5 MG tablet Take 1 tablet (0.5 mg total) by mouth as directed. Take once every few days for severe panic attacks only. 10 tablet 0  . cyclobenzaprine (FLEXERIL) 10 MG tablet Take 10 mg by mouth 2 (two) times daily as needed for muscle spasms.    Marland Kitchen dextroamphetamine (DEXEDRINE SPANSULE) 10 MG 24 hr capsule Take 3 capsules (30 mg total) by mouth daily. Start taking 20 mg daily AM and 10 mg daily in the afternoon 90 capsule 0  . docusate sodium (COLACE) 100 MG capsule Take 300 mg by mouth daily.    . ferrous sulfate 325 (65 FE) MG tablet Take 325 mg by mouth daily with breakfast.    . fluconazole (DIFLUCAN) 150 MG tablet Take 150 mg by mouth daily.    Marland Kitchen gabapentin (NEURONTIN) 100 MG capsule Take 4 capsules (400 mg total) by mouth at bedtime. 360 capsule 1  . hydrochlorothiazide (HYDRODIURIL) 25 MG tablet Take 25 mg by mouth daily as needed.    . hydrOXYzine (ATARAX/VISTARIL) 25 MG tablet Take 1 tablet (25 mg total) by mouth daily as needed for anxiety. 90 tablet 1  . lamoTRIgine (LAMICTAL) 100 MG tablet Take 1 tablet (100 mg total) by mouth daily. 90 tablet 0  . lamoTRIgine (LAMICTAL) 25 MG tablet Take 2 tablets (50 mg total) by mouth daily. To be added to 100 mg daily 180 tablet  0  . levonorgestrel (MIRENA) 20 MCG/24HR IUD 1 each by Intrauterine route once.    . lithium  carbonate (LITHOBID) 300 MG CR tablet TAKE 1 TABLET BY MOUTH EVERY DAY WITH SUPPER 90 tablet 0  . Milnacipran (SAVELLA) 50 MG TABS tablet Take 50 mg by mouth 2 (two) times daily. Takes differently    . montelukast (SINGULAIR) 10 MG tablet Take 10 mg by mouth daily.    . Multiple Vitamin (MULTIVITAMIN) tablet Take 1 tablet by mouth daily.    Marland Kitchen NIFEdipine (PROCARDIA-XL/ADALAT CC) 60 MG 24 hr tablet Take 60 mg by mouth daily.     Marland Kitchen omeprazole (PRILOSEC) 20 MG capsule Take 20 mg by mouth daily.    . traMADol-acetaminophen (ULTRACET) 37.5-325 MG tablet Take 1 tablet by mouth daily as needed.    . zaleplon (SONATA) 10 MG capsule Take 1 capsule (10 mg total) by mouth at bedtime as needed. 30 capsule 2   No current facility-administered medications for this visit.     Musculoskeletal: Strength & Muscle Tone: UTA Gait & Station: UTA Patient leans: N/A  Psychiatric Specialty Exam: Review of Systems  Psychiatric/Behavioral:       Irritable  All other systems reviewed and are negative.   There were no vitals taken for this visit.There is no height or weight on file to calculate BMI.  General Appearance: Casual  Eye Contact:  Fair  Speech:  Clear and Coherent  Volume:  Normal  Mood:  Irritable  Affect:  Appropriate  Thought Process:  Goal Directed and Descriptions of Associations: Intact  Orientation:  Full (Time, Place, and Person)  Thought Content: Logical   Suicidal Thoughts:  No  Homicidal Thoughts:  No  Memory:  Immediate;   Fair Recent;   Fair Remote;   Fair  Judgement:  Fair  Insight:  Fair  Psychomotor Activity:  Normal  Concentration:  Concentration: Fair and Attention Span: Fair  Recall:  [employer] of Knowledge: Fair  Language: Fair  Akathisia:  No  Handed:  Right  AIMS (if indicated): UTA  Assets:  Communication Skills Desire for Improvement Housing Social Support  ADL's:  Intact  Cognition: WNL  Sleep:  Improving   Screenings: AIMS   Flowsheet Row Video  Visit from 03/01/2020 in [facility] Office Visit from 12/11/2017 in [facility] Office Visit from 11/29/2017 in [facility] Office Visit from 11/20/2017 in [facility] Admission (Discharged) from 05/21/2017 in [facility]  AIMS Total Score 8 1 7 5  0    AUDIT   Flowsheet Row Admission (Discharged) from 05/21/2017 in [facility] INPATIENT BE[facility]IORAL MEDICINE  Alcohol Use Disorder Identification Test Final Score (AUDIT) 6    PHQ2-9   Flowsheet Row Video Visit from 03/01/2020 in [facility] Video Visit from 02/11/2020 in [facility]  PHQ-2 Total Score 4 5  PHQ-9 Total Score 14 16    Flowsheet Row Video Visit from 03/01/2020 in [facility]  C-SSRS RISK CATEGORY No Risk       Assessment and Plan: Sierra [name] is a 40 year old Caucasian female who has a history of bipolar disorder, anxiety disorder, binge eating disorder was evaluated by telemedicine today.  Patient is currently struggling with irritability, denies any pain, and reports sleep is improved.  Discussed plan as noted below.  Plan Bipolar disorder depressed-unstable Increase Latuda to 80 mg p.o. daily with supper Continue Lamictal 150 mg p.o. daily Lithium  300 mg p.o. daily at reduced dosage-dose reduced due to tremors Lithium level-02/03/2020- 0.10-subtherapeutic Will order lithium level when she comes back in May. Discontinue Wellbutrin-tapered off. Continue gabapentin 400 mg p.o. nightly Sonata 10 mg manage nightly as needed  Anxiety disorder- improving Gabapentin 400 mg p.o. nightly Hydroxyzine 25 mg p.o. daily as needed for severe anxiety attacks.  Patient advised to limit use. Patient has established care with a therapist and has her first appointment coming up.  ADHD- stable Dexedrine 30 mg p.o.  daily  Binge eating disorder-stable We will monitor closely  Discussed referral for IOP if she does not make progress.  Follow-up in clinic in 2 weeks or sooner if needed.  This note was generated in part or whole with voice recognition software. Voice recognition is usually quite accurate but there are transcription errors that can and very often do occur. I apologize for any typographical errors that were not detected and corrected.       Jomarie Longs, MD 05/13/2020, 8:50 AM

## 2020-05-25 ENCOUNTER — Telehealth: Payer: Self-pay

## 2020-05-25 ENCOUNTER — Telehealth (INDEPENDENT_AMBULATORY_CARE_PROVIDER_SITE_OTHER): Admitting: Psychiatry

## 2020-05-25 ENCOUNTER — Other Ambulatory Visit: Payer: Self-pay

## 2020-05-25 ENCOUNTER — Encounter: Payer: Self-pay | Admitting: Psychiatry

## 2020-05-25 DIAGNOSIS — F5105 Insomnia due to other mental disorder: Secondary | ICD-10-CM | POA: Diagnosis not present

## 2020-05-25 DIAGNOSIS — Z79899 Other long term (current) drug therapy: Secondary | ICD-10-CM

## 2020-05-25 DIAGNOSIS — F3175 Bipolar disorder, in partial remission, most recent episode depressed: Secondary | ICD-10-CM

## 2020-05-25 DIAGNOSIS — F5081 Binge eating disorder: Secondary | ICD-10-CM

## 2020-05-25 DIAGNOSIS — F411 Generalized anxiety disorder: Secondary | ICD-10-CM

## 2020-05-25 DIAGNOSIS — F900 Attention-deficit hyperactivity disorder, predominantly inattentive type: Secondary | ICD-10-CM

## 2020-05-25 MED ORDER — DEXTROAMPHETAMINE SULFATE ER 10 MG PO CP24: 30.0000 mg | ORAL_CAPSULE | Freq: Every day | ORAL | 0 refills | Status: DC

## 2020-05-25 MED ORDER — ZALEPLON 10 MG PO CAPS: 10.0000 mg | ORAL_CAPSULE | Freq: Every evening | ORAL | 2 refills | Status: DC | PRN

## 2020-05-25 NOTE — Telephone Encounter (Signed)
faxed and confirmed labvwork orders for a thyroid profile, bun=creat, urine drug scrren, lithium  dx: [id number]

## 2020-05-25 NOTE — Progress Notes (Signed)
Virtual Visit via Video Note  I connected with Sierra [name] on 05/25/20 at  3:30 PM EDT by a video enabled telemedicine application and verified that I am speaking with the correct person using two identifiers.  Location Provider Location : [facility] Patient Location : Home  Participants: Patient , Provider    I discussed the limitations of evaluation and management by telemedicine and the availability of in person appointments. The patient expressed understanding and agreed to proceed.    I discussed the assessment and treatment plan with the patient. The patient was provided an opportunity to ask questions and all were answered. The patient agreed with the plan and demonstrated an understanding of the instructions.   The patient was advised to call back or seek an in-person evaluation if the symptoms worsen or if the condition fails to improve as anticipated.   BH MD OP Progress Note  05/25/2020 4:13 PM QUINN BARTLING  MRN:  [id number]  Chief Complaint:  Chief Complaint    Follow-up; Anxiety; Depression     HPI: Sierra [name] is a 40 year old Caucasian female, married, lives in [place], has a history of bipolar disorder, GAD, binge eating disorder, ADHD was evaluated by telemedicine today.  Patient today reports since the past few days she has been feeling better.  She reports the Jordan higher dosage has been helpful.  She reports that she has not had any significant crying spells or sadness the past few days.  She completed her exams and did well.  She reports since past Tuesday she has not had school.  She however sprained her back and was in pain for a few days.  She is currently recovering.  That does have an impact on her daily activities.  She continues to feel unmotivated to do anything like picking up a hobby or reading a book.  She reports she is scheduled to see her therapist soon and is willing to work with her.  She reports sleep is good.  She denies  suicidality, homicidality or perceptual disturbances.  Patient is compliant on her medications and denies side effects.  Patient denies any other concerns today.  Visit Diagnosis:    ICD-10-CM   1. Bipolar disorder, in partial remission, most recent episode depressed (HCC)  F31.75   2. GAD (generalized anxiety disorder)  F41.1 zaleplon (SONATA) 10 MG capsule  3. Attention deficit hyperactivity disorder (ADHD), predominantly inattentive type  F90.0 dextroamphetamine (DEXEDRINE SPANSULE) 10 MG 24 hr capsule  4. Insomnia due to mental condition  F51.05   5. Binge eating disorder  F50.81   6. High risk medication use  Z79.899 Lithium level    Thyroid Profile    Urine drugs of abuse scrn w alc, routine (Ref Lab)    BUN+Creat    Past Psychiatric History: I have reviewed past psychiatric history from progress note on 06/20/2017  Past Medical History:  Past Medical History:  Diagnosis Date  . Abdominal pain, other specified site   . ADHD   . 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 Anemia   . Anxiety state, unspecified   . Bipolar 1 disorder, depressed (HCC) 05/21/2017  . BIPOLAR AFFECTIVE DISORDER 12/23/2006   Qualifier: Diagnosis of  By: Ermalene Searing MD, Amy    . Bipolar affective disorder (HCC) 10/12/2011  . Bipolar disorder, unspecified (HCC)   . Depressive disorder, not elsewhere classified   . Edema   . Esophageal reflux   . Fibromyalgia   . Headache   . History  of kidney stones   . Mild or unspecified pre-eclampsia, unspecified as to episode of care   . Morbid obesity (HCC)   . Myalgia and myositis, unspecified   . Other dyspnea and respiratory abnormality   . Preeclampsia    with first pregnancy  . Raynaud's disease   . Urinary tract infection, site not specified     Past Surgical History:  Procedure Laterality Date  . CESAREAN SECTION     X 2  . CHOLECYSTECTOMY  03/2006  . CYSTOSCOPY W/ URETERAL STENT PLACEMENT Left 05/14/2016   Procedure: CYSTOSCOPY WITH STENT  REPLACEMENT;  Surgeon: Vanna ScotlandAshley Brandon, MD;  Location: [facility];  Service: Urology;  Laterality: Left;  . CYSTOSCOPY WITH STENT PLACEMENT Left 04/25/2016   Procedure: CYSTOSCOPY WITH STENT PLACEMENT;  Surgeon: Vanna ScotlandAshley Brandon, MD;  Location: [facility];  Service: Urology;  Laterality: Left;  . DILATION AND CURETTAGE OF UTERUS    . IVC FILTER INSERTION  03/26/2016   [facility]  . LAPAROSCOPIC GASTRIC RESTRICTIVE DUODENAL PROCEDURE (DUODENAL SWITCH)  03/2016  . LITHOTRIPSY    . TONSILLECTOMY AND ADENOIDECTOMY    . URETEROSCOPY WITH HOLMIUM LASER LITHOTRIPSY Left 05/14/2016   Procedure: URETEROSCOPY WITH HOLMIUM LASER LITHOTRIPSY;  Surgeon: Vanna ScotlandAshley Brandon, MD;  Location: [facility];  Service: Urology;  Laterality: Left;    Family Psychiatric History: I have reviewed family psychiatric history from progress note from 06/20/2017  Family History:  Family History  Problem Relation Age of Onset  . Depression Father   . Alcohol abuse Father   . Depression Mother   . Hyperlipidemia Mother   . Sleep apnea Mother   . Depression Sister   . Hyperlipidemia Brother   . Depression Brother   . Hyperlipidemia Brother   . Depression Brother   . Alcohol abuse Brother   . Coronary artery disease Maternal Grandmother   . Heart attack Maternal Grandmother   . Diabetes Maternal Grandmother   . Lung cancer Maternal Grandfather   . Emphysema Maternal Grandfather   . Coronary artery disease Maternal Grandfather   . Lupus Other        Aunt  . Fibromyalgia Other        Aunt    Social History: I have reviewed social history from progress note on 06/20/2017 Social History   Socioeconomic History  . Marital status: Married    Spouse name: brian  . Number of children: 2  . Years of education: Not on file  . Highest education level: Some college, no degree  Occupational History  . Occupation: Home maker  Tobacco Use  . Smoking status: Never Smoker  . Smokeless tobacco: Never Used  Vaping Use  . Vaping  Use: Never used  Substance and Sexual Activity  . Alcohol use: Yes    Alcohol/week: 0.0 standard drinks    Comment: occassional  . Drug use: No  . Sexual activity: Yes    Partners: Male    Birth control/protection: None, I.U.D.  Other Topics Concern  . Not on file  Social History Narrative   1 child, 2 step-sons      Regular exercise-no   Diet: no fast food, likes sweets   Social Determinants of Health   Financial Resource Strain: Not on file  Food Insecurity: Not on file  Transportation Needs: Not on file  Physical Activity: Not on file  Stress: Not on file  Social Connections: Not on file    Allergies:  Allergies  Allergen Reactions  . Uncaria Tomentosa (Cats Claw)  Other (See Comments)    allgeric to cats in general causes sneezing  . Bee Pollen Itching  . Molds & Smuts Other (See Comments)  . Pollen Extract   . Milk-Related Compounds     Able to tolerate yogurt; mild intolerance to milk/cheese  . Pineapple Rash  . Soy Allergy Diarrhea  . Strawberry Extract Rash    Break out on tongue noted Break out on tongue noted Break out on tongue noted    Metabolic Disorder Labs: Lab Results  Component Value Date   HGBA1C 4.7 (L) 05/22/2017   MPG 88.19 05/22/2017   Lab Results  Component Value Date   PROLACTIN 7.6 11/07/2018   Lab Results  Component Value Date   CHOL 105 05/22/2017   TRIG 41 05/22/2017   HDL 48 05/22/2017   CHOLHDL 2.2 05/22/2017   VLDL 8 05/22/2017   LDLCALC 49 05/22/2017   LDLCALC 99 09/11/2013   Lab Results  Component Value Date   TSH 0.504 05/21/2018   TSH 0.677 05/22/2017    Therapeutic Level Labs: Lab Results  Component Value Date   LITHIUM 0.10 (L) 02/03/2020   LITHIUM 0.18 (L) 08/13/2019   No results found for: VALPROATE No components found for:  CBMZ  Current Medications: Current Outpatient Medications  Medication Sig Dispense Refill  . amoxicillin (AMOXIL) 875 MG tablet SMARTSIG:1 Tablet(s) By Mouth Every 12 Hours     . Biotin 5000 MCG TABS Take 1 tablet by mouth daily.    Marland Kitchen BLISOVI 24 FE 1-20 MG-MCG(24) tablet TAKE 1 TABLET BY MOUTH EVERY DAY 84 tablet 4  . calcium citrate-vitamin D 500-400 MG-UNIT chewable tablet Chew 1 tablet by mouth daily.     . ciclopirox (PENLAC) 8 % solution Apply topically at bedtime.    . clonazePAM (KLONOPIN) 0.5 MG tablet Take 1 tablet (0.5 mg total) by mouth as directed. Take once every few days for severe panic attacks only. 10 tablet 0  . cyclobenzaprine (FLEXERIL) 10 MG tablet Take 10 mg by mouth 2 (two) times daily as needed for muscle spasms.    Melene Muller ON 06/09/2020] dextroamphetamine (DEXEDRINE SPANSULE) 10 MG 24 hr capsule Take 3 capsules (30 mg total) by mouth daily. Start taking 20 mg daily AM and 10 mg daily in the afternoon 90 capsule 0  . docusate sodium (COLACE) 100 MG capsule Take 300 mg by mouth daily.    . ferrous sulfate 325 (65 FE) MG tablet Take 325 mg by mouth daily with breakfast.    . fluconazole (DIFLUCAN) 150 MG tablet Take 150 mg by mouth daily.    Marland Kitchen gabapentin (NEURONTIN) 100 MG capsule Take 4 capsules (400 mg total) by mouth at bedtime. 360 capsule 1  . hydrochlorothiazide (HYDRODIURIL) 25 MG tablet Take 25 mg by mouth daily as needed.    . hydrOXYzine (ATARAX/VISTARIL) 25 MG tablet Take 1 tablet (25 mg total) by mouth daily as needed for anxiety. 90 tablet 1  . lamoTRIgine (LAMICTAL) 100 MG tablet Take 1 tablet (100 mg total) by mouth daily. 90 tablet 0  . lamoTRIgine (LAMICTAL) 25 MG tablet Take 2 tablets (50 mg total) by mouth daily. To be added to 100 mg daily 180 tablet 0  . levonorgestrel (MIRENA) 20 MCG/24HR IUD 1 each by Intrauterine route once.    . lithium carbonate (LITHOBID) 300 MG CR tablet TAKE 1 TABLET BY MOUTH EVERY DAY WITH SUPPER 90 tablet 0  . lurasidone (LATUDA) 80 MG TABS tablet Take 1 tablet (80 mg total)  by mouth daily with supper. 30 tablet 1  . lurasidone (LATUDA) 80 MG TABS tablet Take 1 tablet (80 mg total) by mouth daily with  supper. 15 tablet 0  . methylPREDNISolone (MEDROL DOSEPAK) 4 MG TBPK tablet SMARTSIG:1 Dose Pack By Mouth    . Milnacipran (SAVELLA) 50 MG TABS tablet Take 50 mg by mouth 2 (two) times daily. Takes differently    . montelukast (SINGULAIR) 10 MG tablet Take 10 mg by mouth daily.    . Multiple Vitamin (MULTIVITAMIN) tablet Take 1 tablet by mouth daily.    Marland Kitchen NIFEdipine (PROCARDIA-XL/ADALAT CC) 60 MG 24 hr tablet Take 60 mg by mouth daily.     Marland Kitchen omeprazole (PRILOSEC) 20 MG capsule Take 20 mg by mouth daily.    . traMADol-acetaminophen (ULTRACET) 37.5-325 MG tablet Take 1 tablet by mouth daily as needed.    Melene Muller ON 06/16/2020] zaleplon (SONATA) 10 MG capsule Take 1 capsule (10 mg total) by mouth at bedtime as needed. 30 capsule 2   No current facility-administered medications for this visit.     Musculoskeletal: Strength & Muscle Tone: UTA Gait & Station: UTA Patient leans: N/A  Psychiatric Specialty Exam: Review of Systems  Musculoskeletal: Positive for back pain.  Psychiatric/Behavioral: Positive for dysphoric mood (improving).  All other systems reviewed and are negative.   There were no vitals taken for this visit.There is no height or weight on file to calculate BMI.  General Appearance: Casual  Eye Contact:  Fair  Speech:  Clear and Coherent  Volume:  Normal  Mood:  Dysphoric improving  Affect:  Congruent  Thought Process:  Goal Directed and Descriptions of Associations: Intact  Orientation:  Full (Time, Place, and Person)  Thought Content: Logical   Suicidal Thoughts:  No  Homicidal Thoughts:  No  Memory:  Immediate;   Fair Recent;   Fair Remote;   Fair  Judgement:  Fair  Insight:  Fair  Psychomotor Activity:  Normal  Concentration:  Concentration: Fair and Attention Span: Fair  Recall:  [employer] of Knowledge: Fair  Language: Fair  Akathisia:  No  Handed:  Right  AIMS (if indicated): UTA  Assets:  Communication Skills Desire for Improvement Social Support   ADL's:  Intact  Cognition: WNL  Sleep:  Fair   Screenings: AIMS   Flowsheet Row Video Visit from 03/01/2020 in [facility] Office Visit from 12/11/2017 in [facility] Office Visit from 11/29/2017 in [facility] Office Visit from 11/20/2017 in [facility] Admission (Discharged) from 05/21/2017 in [facility]PATIENT BEH[facility] MEDICINE  AIMS Total Score 0    AUDIT   Flowsheet Row Admission (Discharged) from 05/21/2017 in [facility] INPATIENT BEHAVIORAL MEDICINE  Alcohol Use Disorder Identification Test Final Score (AUDIT) 6    PHQ2-9   Flowsheet Row Video Visit from 05/25/2020 in [facility] Video Visit from 03/01/2020 in [facility] Video Visit from 02/11/2020 in [facility]  PHQ-2 Total Score PHQ-9 Total Score Flowsheet Row Video Visit from 03/01/2020 in [facility]  C-SSRS RISK CATEGORY No Risk       Assessment and Plan: ADISA VIGEANT is a 40 year old Caucasian female who has a history of bipolar disorder, anxiety disorder, binge eating disorder was evaluated by telemedicine today.  Patient is currently making progress however currently reports she continues to have lack of  motivation and is in pain.  She will benefit from the following plan.  Plan Bipolar disorder in partial remission Latuda 80 mg p.o. daily Continue Lamictal 150 mg p.o. daily Lithium 300 mg p.o. daily at reduced dosage-dose reduced due to tremors. Lithium level on 02/03/2020-0.10-subtherapeutic We will order lithium levels today. Continue gabapentin 400 mg p.o. nightly Sonata 10 mg p.o. nightly as needed  Anxiety disorder-improving Gabapentin 400 mg p.o. nightly Hydroxyzine 25 mg p.o. daily as needed for severe anxiety attacks Patient has upcoming appointment with  therapist-Ms. Tiffany.  Patient to work with her therapist on her current motivation problem as well as anxiety.  ADHD-stable Dexedrine 30 mg p.o. daily  Binge eating disorder-stable CBT as needed  High risk medication use-we will order lithium level, TSH, urine drug screen, BUN plus creatinine.  She will go to [facility] lab.  Follow-up in clinic in 6 weeks or sooner if needed in office.   This note was generated in part or whole with voice recognition software. Voice recognition is usually quite accurate but there are transcription errors that can and very often do occur. I apologize for any typographical errors that were not detected and corrected.      Jomarie Longs, MD 05/26/2020, 6:26 PM

## 2020-06-01 ENCOUNTER — Telehealth: Payer: Self-pay

## 2020-06-01 ENCOUNTER — Other Ambulatory Visit
Admission: RE | Admit: 2020-06-01 | Discharge: 2020-06-01 | Disposition: A | Discharge status: Home / Self Care | Attending: Psychiatry | Admitting: Psychiatry

## 2020-06-01 DIAGNOSIS — Z79899 Other long term (current) drug therapy: Secondary | ICD-10-CM | POA: Diagnosis not present

## 2020-06-01 LAB — LITHIUM LEVEL: Lithium Lvl: 0.12 mmol/L — ABNORMAL LOW (ref 0.60–1.20)

## 2020-06-01 NOTE — Addendum Note (Signed)
Addended by: Conni Slipper on: 06/01/2020 11:06 AM   Modules accepted: Orders

## 2020-06-01 NOTE — Telephone Encounter (Signed)
Patient called stating that at last visit she discussed with you having a hysterectomy at end of year.  She called to ask if she could have colpo/biopsies under anesthesia outpatient versus doing hysterectomy.  She said she has so much anxiety she does not think she could do it in the office.

## 2020-06-07 NOTE — Telephone Encounter (Signed)
Left detailed message on patient voicemail to call and schedule OV to discuss.

## 2020-06-14 ENCOUNTER — Telehealth: Payer: Self-pay

## 2020-06-14 NOTE — Telephone Encounter (Signed)
pt called states she went to have labwork done and she stated that the lab only had order for the lithium. she told them that she had other labwork and a urnine test that needed to be done so they drew the extra test and a urnine .  the lady told her that she found the order. But she states when she goes to her my chart that the lithium levels is the only thing that comes up.

## 2020-06-14 NOTE — Telephone Encounter (Signed)
i called the [facility] lab and they states that the only order that they have is the lithium level and that the other tub and the urnine was disposed of because they didint receive a order.

## 2020-06-14 NOTE — Telephone Encounter (Signed)
If you check chart review , click on labs - 05/25/2020 - Urine , TSH and Bun creatinine was ordered . Not sure why no one can view it since Its still there .

## 2020-06-16 NOTE — Telephone Encounter (Signed)
Appt scheduled

## 2020-06-27 ENCOUNTER — Other Ambulatory Visit
Admission: RE | Admit: 2020-06-27 | Discharge: 2020-06-27 | Disposition: A | Discharge status: Home / Self Care | Attending: Psychiatry | Admitting: Psychiatry

## 2020-06-27 DIAGNOSIS — Z79899 Other long term (current) drug therapy: Secondary | ICD-10-CM | POA: Diagnosis not present

## 2020-06-27 LAB — LITHIUM LEVEL: Lithium Lvl: 0.17 mmol/L — ABNORMAL LOW (ref 0.60–1.20)

## 2020-06-27 LAB — CREATININE, SERUM
Creatinine, Ser: 1.02 mg/dL — ABNORMAL HIGH (ref 0.44–1.00)
GFR, Estimated: >60 mL/min (ref >60)

## 2020-06-27 LAB — BUN: BUN: 10 mg/dL (ref 6–20)

## 2020-06-28 LAB — THYROID PANEL
Free Thyroxine Index: 1.6 (ref 1.2–4.9)
T3 Uptake Ratio: 20 % — ABNORMAL LOW (ref 24–39)
T4, Total: 7.8 ug/dL (ref 4.5–12.0)

## 2020-06-29 ENCOUNTER — Telehealth: Payer: Self-pay | Admitting: Psychiatry

## 2020-06-29 NOTE — Telephone Encounter (Signed)
Returned call to patient about her thyroid panel abnormality.  She reports she is on biotin which could have contributed to the same.  She does have upcoming labs coming up beginning of July.  Advised patient to talk to her primary care provider regarding abnormal labs.

## 2020-07-06 ENCOUNTER — Ambulatory Visit: Admitting: Psychiatry

## 2020-07-06 LAB — URINE DRUGS OF ABUSE SCREEN W ALC, ROUTINE (REF LAB)
Barbiturate, Ur: NEGATIVE ng/mL
Benzodiazepine Quant, Ur: NEGATIVE ng/mL
Cannabinoid Quant, Ur: NEGATIVE ng/mL
Cocaine (Metab.): NEGATIVE ng/mL
Ethanol U, Quan: NEGATIVE %
Methadone Screen, Urine: NEGATIVE ng/mL
Opiate Quant, Ur: NEGATIVE ng/mL
Phencyclidine, Ur: NEGATIVE ng/mL
Propoxyphene, Urine: NEGATIVE ng/mL

## 2020-07-06 LAB — AMPHETAMINE CONF, UR
Amphetamine GC/MS Conf: >3000 ng/mL
Amphetamine, Ur: POSITIVE — AB
Amphetamines: POSITIVE — AB
Methamphetamine, Ur: NEGATIVE

## 2020-07-08 ENCOUNTER — Encounter: Payer: Self-pay | Admitting: Obstetrics & Gynecology

## 2020-07-08 ENCOUNTER — Ambulatory Visit (INDEPENDENT_AMBULATORY_CARE_PROVIDER_SITE_OTHER): Admitting: Obstetrics & Gynecology

## 2020-07-08 ENCOUNTER — Other Ambulatory Visit: Payer: Self-pay

## 2020-07-08 ENCOUNTER — Telehealth: Payer: Self-pay | Admitting: Psychiatry

## 2020-07-08 VITALS — BP 130/80

## 2020-07-08 DIAGNOSIS — R87612 Low grade squamous intraepithelial lesion on cytologic smear of cervix (LGSIL): Secondary | ICD-10-CM

## 2020-07-08 DIAGNOSIS — R87810 Cervical high risk human papillomavirus (HPV) DNA test positive: Secondary | ICD-10-CM

## 2020-07-08 DIAGNOSIS — N871 Moderate cervical dysplasia: Secondary | ICD-10-CM | POA: Diagnosis not present

## 2020-07-08 NOTE — Telephone Encounter (Signed)
Reviewed labs

## 2020-07-08 NOTE — Progress Notes (Signed)
    Sierra [name] 11/15/1980 [id number]        40 y.o.  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  A lot of anxiety associated with Colposcopy procedures.  Desires proceeding under general anesthesia for a Colpo/LEEP procedure.   OB History  Gravida Para Term Preterm AB Living  5 2     2 2   SAB IAB Ectopic Multiple Live Births  2            # Outcome Date GA Lbr Len/2nd Weight Sex Delivery Anes PTL Lv  5 Gravida           4 SAB           3 SAB           2 Para           1 Para             Past medical history,surgical history, problem list, medications, allergies, family history and social history were all reviewed and documented in the EPIC chart.   Directed ROS with pertinent positives and negatives documented in the history of present illness/assessment and plan.  Exam:  Vitals:   07/08/20 1436  BP: 130/80   General appearance:  Normal  Gynecologic exam: Deferred   Assessment/Plan:  40 y.o. 38   1. LGSIL on Pap smear of cervix Last Pap test 02/2020 LGSIL/HPV HR.  H/O HPV 16 pos.  Severe anxiety associated with Colposcopy procedure.  Will do Colpo/LEEP under general anesthesia at [facility]. Procedure/risks/benefits reviewed with patient who voiced understanding and agreement with plan.  2. 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  3. Dysplasia of cervix, high grade CIN 1 and 2  H/O CIN 1-2.  [facility] MD, 3:03 PM 07/08/2020

## 2020-07-11 ENCOUNTER — Encounter: Payer: Self-pay | Admitting: *Deleted

## 2020-07-11 ENCOUNTER — Telehealth: Payer: Self-pay | Admitting: *Deleted

## 2020-07-11 NOTE — Telephone Encounter (Signed)
Spoke with patient.  Height updated in chart.  BMI 58.88 Will need to be scheduled at [facility] OR.  Patient declines 08/02/20.  Request to proceed with scheduling on 08/30/20.  Advised patient I will return call once date/time confirmed. Patient agreeable.   Surgery request sent.   Routing to [facility].

## 2020-07-11 NOTE — Telephone Encounter (Signed)
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

## 2020-07-11 NOTE — Telephone Encounter (Signed)
Left message to call Nettie Cromwell, RN at [facility], [phone].  

## 2020-07-12 ENCOUNTER — Encounter: Payer: Self-pay | Admitting: Obstetrics & Gynecology

## 2020-07-14 ENCOUNTER — Other Ambulatory Visit: Payer: Self-pay | Admitting: Psychiatry

## 2020-07-14 DIAGNOSIS — F3131 Bipolar disorder, current episode depressed, mild: Secondary | ICD-10-CM

## 2020-07-22 ENCOUNTER — Other Ambulatory Visit: Payer: Self-pay | Admitting: Psychiatry

## 2020-07-22 ENCOUNTER — Telehealth: Payer: Self-pay

## 2020-07-22 DIAGNOSIS — F900 Attention-deficit hyperactivity disorder, predominantly inattentive type: Secondary | ICD-10-CM

## 2020-07-22 MED ORDER — DEXTROAMPHETAMINE SULFATE ER 10 MG PO CP24: 30.0000 mg | ORAL_CAPSULE | Freq: Every day | ORAL | 0 refills | Status: DC

## 2020-07-22 NOTE — Telephone Encounter (Signed)
pt call left message that she needs a refill on the dextroamphetamin  Dr. Elna Breslow patient.

## 2020-07-22 NOTE — Telephone Encounter (Signed)
Ordered.   I have utilized the [place] Controlled Substances Reporting System (PMP AWARxE) to confirm adherence regarding the patient's medication. My review reveals appropriate prescription fills.

## 2020-07-26 ENCOUNTER — Telehealth: Payer: Self-pay

## 2020-07-26 NOTE — Telephone Encounter (Signed)
pt called states that rx for the dextroamphetamine was sent to the wrong pharamcy [facility] [place].

## 2020-07-28 ENCOUNTER — Other Ambulatory Visit: Payer: Self-pay | Admitting: Psychiatry

## 2020-07-28 ENCOUNTER — Telehealth: Payer: Self-pay

## 2020-07-28 DIAGNOSIS — F900 Attention-deficit hyperactivity disorder, predominantly inattentive type: Secondary | ICD-10-CM

## 2020-07-28 MED ORDER — DEXTROAMPHETAMINE SULFATE ER 10 MG PO CP24: 30.0000 mg | ORAL_CAPSULE | Freq: Every day | ORAL | 0 refills | Status: DC

## 2020-07-28 NOTE — Telephone Encounter (Signed)
Ordered

## 2020-07-28 NOTE — Telephone Encounter (Signed)
pt called states that the pharmacy did not get her refill on the dextroamphetamine she states that she is completely out and needs it sent to [facility]

## 2020-08-04 ENCOUNTER — Other Ambulatory Visit: Payer: Self-pay

## 2020-08-04 ENCOUNTER — Encounter: Payer: Self-pay | Admitting: Psychiatry

## 2020-08-04 ENCOUNTER — Telehealth (INDEPENDENT_AMBULATORY_CARE_PROVIDER_SITE_OTHER): Admitting: Psychiatry

## 2020-08-04 VITALS — BP 137/84 | HR 109 | Temp 97.9°F | Ht 63.19 in | Wt 342.4 lb

## 2020-08-04 DIAGNOSIS — F411 Generalized anxiety disorder: Secondary | ICD-10-CM | POA: Diagnosis not present

## 2020-08-04 DIAGNOSIS — F900 Attention-deficit hyperactivity disorder, predominantly inattentive type: Secondary | ICD-10-CM | POA: Diagnosis not present

## 2020-08-04 DIAGNOSIS — G4701 Insomnia due to medical condition: Secondary | ICD-10-CM

## 2020-08-04 DIAGNOSIS — F3132 Bipolar disorder, current episode depressed, moderate: Secondary | ICD-10-CM

## 2020-08-04 DIAGNOSIS — F5105 Insomnia due to other mental disorder: Secondary | ICD-10-CM

## 2020-08-04 DIAGNOSIS — R7989 Other specified abnormal findings of blood chemistry: Secondary | ICD-10-CM

## 2020-08-04 DIAGNOSIS — Z79899 Other long term (current) drug therapy: Secondary | ICD-10-CM

## 2020-08-04 DIAGNOSIS — F5081 Binge eating disorder: Secondary | ICD-10-CM

## 2020-08-04 MED ORDER — LITHIUM CARBONATE ER 300 MG PO TBCR: EXTENDED_RELEASE_TABLET | ORAL | 0 refills | Status: DC

## 2020-08-04 MED ORDER — DEXTROAMPHETAMINE SULFATE ER 10 MG PO CP24: 20.0000 mg | ORAL_CAPSULE | Freq: Every day | ORAL | 0 refills | Status: DC

## 2020-08-04 MED ORDER — LAMOTRIGINE 25 MG PO TABS: 25.0000 mg | ORAL_TABLET | Freq: Every day | ORAL | 0 refills | Status: DC

## 2020-08-04 MED ORDER — LAMOTRIGINE 150 MG PO TABS: 150.0000 mg | ORAL_TABLET | Freq: Every day | ORAL | 0 refills | Status: DC

## 2020-08-04 NOTE — Patient Instructions (Signed)
  [facility].  Phone:  [phone]  Address:  [address]  [place], [place] [address]  Hours:  Open 24/7, No appointment required.

## 2020-08-04 NOTE — Progress Notes (Signed)
BH MD OP Progress Note  08/04/2020 5:16 PM Sierra [name]  MRN:  [id number]  Chief Complaint:  Chief Complaint   Follow-up    HPI: Sierra [name] is a 40 year old Caucasian female, married, lives in [place], has a history of bipolar disorder, GAD, binge eating disorder, ADHD, obesity, status post bariatric surgery, was evaluated in office today.  Patient today presents to the office reporting that she has been having worsening mood symptoms since the past 2 to 3 weeks.  Patient reports she started feeling bad, low mood, sadness, irritability few weeks ago.  However reports her mood symptoms started worsening a week ago.  She does report multiple psychosocial stressors.  She reports her husband was diagnosed with medical problems, currently trying to rule out seizure disorder.  He cannot drive and hence she has to drive him to all his appointments, she has to also drive the children back and forth and herself to all her appointments.  This has been very stressful for her since she has no social support to help with all this.  She has has been very irritable, has been having sleep problems.  She denies any suicidality or suicidal plan.  She however reports she is okay if she does not wake up tomorrow morning.  She has been following up with her therapist Ms. Tiffany [facility] and reports that has been helpful.  She sees a therapist on a weekly basis.  Patient reports she also feels like her weight could also be contributing to a lot of her problems.  She is obese, status post bariatric surgery.  Patient reports she keeps gaining weight in spite of not eating much.  She denies any binging on food.  Patient wonders whether her medications are contributing to the weight gain.  Patient reports she has been urinating a lot, wakes up several times at night.  This has been going on since the past few weeks.  She has extremely dry mouth and wonders whether her medications are causing it.   She reports her primary care provider initially gave her hydrochlorothiazide which she did not like.  She was later on placed on Lasix.  She stopped taking that also.  In spite of that she has been waking up several times to urinate and this only happens at night.  This does have an impact on her sleep.  Otherwise the Sonata was beneficial.  Patient with recent elevated creatinine level-dated 06/27/2020-reports she was told it is likely due to dehydration.    Visit Diagnosis:    ICD-10-CM   1. Bipolar 1 disorder, depressed, moderate (HCC)  F31.32 lamoTRIgine (LAMICTAL) 150 MG tablet    2. GAD (generalized anxiety disorder)  F41.1 lithium carbonate (LITHOBID) 300 MG CR tablet    3. Attention deficit hyperactivity disorder (ADHD), predominantly inattentive type  F90.0 dextroamphetamine (DEXEDRINE SPANSULE) 10 MG 24 hr capsule    4. Insomnia due to medical condition  G47.01 lamoTRIgine (LAMICTAL) 25 MG tablet   frequent urination    5. Binge eating disorder  F50.81     6. High risk medication use  Z79.899 BUN+Creat    7. Elevated serum creatinine  R79.89       Past Psychiatric History: Reviewed past psychiatric history from progress note on 06/20/2017.  Past Medical History:  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   Anemia    Anxiety  state, unspecified    Bipolar 1 disorder, depressed (HCC) 05/21/2017   BIPOLAR AFFECTIVE DISORDER 12/23/2006   Qualifier: Diagnosis of  By: Ermalene Searing MD, Amy     Bipolar affective disorder (HCC) 10/12/2011   Bipolar disorder, unspecified (HCC)    Depressive disorder, not elsewhere classified    Edema    Esophageal reflux    Fibromyalgia    Headache    History of kidney stones    Mild or unspecified pre-eclampsia, unspecified as to episode of care    Morbid obesity (HCC)    Myalgia and myositis, unspecified    Other dyspnea and respiratory abnormality     Preeclampsia    with first pregnancy   Raynaud's disease    Urinary tract infection, site not specified     Past Surgical History:  Procedure Laterality Date   CESAREAN SECTION     X 2   CHOLECYSTECTOMY  03/2006   CYSTOSCOPY W/ URETERAL STENT PLACEMENT Left 05/14/2016   Procedure: CYSTOSCOPY WITH STENT REPLACEMENT;  Surgeon: [name], MD;  Location: [facility];  Service: Urology;  Laterality: Left;   CYSTOSCOPY WITH STENT PLACEMENT Left 04/25/2016   Procedure: CYSTOSCOPY WITH STENT PLACEMENT;  Surgeon: [name], MD;  Location: [facility];  Service: Urology;  Laterality: Left;   DILATION AND CURETTAGE OF UTERUS     IVC FILTER INSERTION  03/26/2016   [facility]   LAPAROSCOPIC GASTRIC RESTRICTIVE DUODENAL PROCEDURE (DUODENAL SWITCH)  03/2016   LITHOTRIPSY     TONSILLECTOMY AND ADENOIDECTOMY     URETEROSCOPY WITH HOLMIUM LASER LITHOTRIPSY Left 05/14/2016   Procedure: URETEROSCOPY WITH HOLMIUM LASER LITHOTRIPSY;  Surgeon: [name], MD;  Location: [facility];  Service: Urology;  Laterality: Left;    Family Psychiatric History: Reviewed past psychiatric history from progress note on 06/20/2017  Family History:  Family History  Problem Relation Age of Onset   Depression Father    Alcohol abuse Father    Depression Mother    Hyperlipidemia Mother    Sleep apnea Mother    Depression Sister    Hyperlipidemia Brother    Depression Brother    Hyperlipidemia Brother    Depression Brother    Alcohol abuse Brother    Coronary artery disease Maternal Grandmother    Heart attack Maternal Grandmother    Diabetes Maternal Grandmother    Lung cancer Maternal Grandfather    Emphysema Maternal Grandfather    Coronary artery disease Maternal Grandfather    Lupus Other        Aunt   Fibromyalgia Other        Aunt    Social History: Reviewed social history from progress note on 06/20/2017 Social History   Socioeconomic History   Marital status: Married    Spouse name: brian    Number of children: 2   Years of education: Not on file   Highest education level: Some college, no degree  Occupational History   Occupation: Arts development officer  Tobacco Use   Smoking status: Never   Smokeless tobacco: Never  Vaping Use   Vaping Use: Never used  Substance and Sexual Activity   Alcohol use: Yes    Alcohol/week: 0.0 standard drinks    Comment: occassional   Drug use: No   Sexual activity: Yes    Partners: Male    Birth control/protection: None, I.U.D.  Other Topics Concern   Not on file  Social History Narrative   1 child, 2 step-sons      Regular exercise-no  Diet: no fast food, likes sweets   Social Determinants of Health   Financial Resource Strain: Not on file  Food Insecurity: Not on file  Transportation Needs: Not on file  Physical Activity: Not on file  Stress: Not on file  Social Connections: Not on file    Allergies:  Allergies  Allergen Reactions   Uncaria Tomentosa (Cats Claw) Other (See Comments)    allgeric to cats in general causes sneezing   Bee Pollen Itching   Molds & Smuts Other (See Comments)   Pollen Extract    Milk-Related Compounds     Able to tolerate yogurt; mild intolerance to milk/cheese   Pineapple Rash   Soy Allergy Diarrhea   Strawberry Extract Rash    Break out on tongue noted Break out on tongue noted Break out on tongue noted    Metabolic Disorder Labs: Lab Results  Component Value Date   HGBA1C 4.7 (L) 05/22/2017   MPG 88.19 05/22/2017   Lab Results  Component Value Date   PROLACTIN 7.6 11/07/2018   Lab Results  Component Value Date   CHOL 105 05/22/2017   TRIG 41 05/22/2017   HDL 48 05/22/2017   CHOLHDL 2.2 05/22/2017   VLDL 8 05/22/2017   LDLCALC 49 05/22/2017   LDLCALC 99 09/11/2013   Lab Results  Component Value Date   TSH 0.504 05/21/2018   TSH 0.677 05/22/2017    Therapeutic Level Labs: Lab Results  Component Value Date   LITHIUM 0.17 (L) 06/27/2020   LITHIUM 0.12 (L) 06/01/2020   No  results found for: VALPROATE No components found for:  CBMZ  Current Medications: Current Outpatient Medications  Medication Sig Dispense Refill   Biotin 5000 MCG TABS Take 1 tablet by mouth daily.     BLISOVI 24 FE 1-20 MG-MCG(24) tablet TAKE 1 TABLET BY MOUTH EVERY DAY 84 tablet 4   calcium citrate-vitamin D 500-400 MG-UNIT chewable tablet Chew 1 tablet by mouth daily.      clonazePAM (KLONOPIN) 0.5 MG tablet Take 1 tablet (0.5 mg total) by mouth as directed. Take once every few days for severe panic attacks only. 10 tablet 0   cyclobenzaprine (FLEXERIL) 10 MG tablet Take 10 mg by mouth 2 (two) times daily as needed for muscle spasms.     ferrous sulfate 325 (65 FE) MG tablet Take 325 mg by mouth daily with breakfast.     gabapentin (NEURONTIN) 100 MG capsule Take 4 capsules (400 mg total) by mouth at bedtime. 360 capsule 1   hydrOXYzine (ATARAX/VISTARIL) 25 MG tablet Take 1 tablet (25 mg total) by mouth daily as needed for anxiety. 90 tablet 1   lamoTRIgine (LAMICTAL) 150 MG tablet Take 1 tablet (150 mg total) by mouth daily. Start taking with 25 mg daily 90 tablet 0   LATUDA 80 MG TABS tablet TAKE ONE TABLET BY MOUTH EVERY DAY WITH SUPPER ** DOSE INCREASE 30 tablet 1   levonorgestrel (MIRENA) 20 MCG/24HR IUD 1 each by Intrauterine route once.     Milnacipran (SAVELLA) 50 MG TABS tablet Take 50 mg by mouth 2 (two) times daily. Takes differently     montelukast (SINGULAIR) 10 MG tablet Take 10 mg by mouth daily.     Multiple Vitamin (MULTIVITAMIN) tablet Take 1 tablet by mouth daily.     NIFEdipine (PROCARDIA-XL/ADALAT CC) 60 MG 24 hr tablet Take 60 mg by mouth daily.      omeprazole (PRILOSEC) 20 MG capsule Take 20 mg by mouth daily.  zaleplon (SONATA) 10 MG capsule Take 1 capsule (10 mg total) by mouth at bedtime as needed. 30 capsule 2   dextroamphetamine (DEXEDRINE SPANSULE) 10 MG 24 hr capsule Take 2 capsules (20 mg total) by mouth daily. 90 capsule 0   docusate sodium (COLACE) 100  MG capsule Take 300 mg by mouth daily. (Patient not taking: Reported on 08/04/2020)     furosemide (LASIX) 40 MG tablet Take 40 mg by mouth daily. (Patient not taking: Reported on 08/04/2020)     hydrochlorothiazide (HYDRODIURIL) 25 MG tablet Take 25 mg by mouth daily as needed. (Patient not taking: Reported on 08/04/2020)     lamoTRIgine (LAMICTAL) 25 MG tablet Take 1 tablet (25 mg total) by mouth daily. To be added to 150 mg daily 90 tablet 0   lithium carbonate (LITHOBID) 300 MG CR tablet Take half tablet daily for a week and stop 90 tablet 0   traMADol-acetaminophen (ULTRACET) 37.5-325 MG tablet Take 1 tablet by mouth daily as needed. (Patient not taking: Reported on 08/04/2020)     No current facility-administered medications for this visit.     Musculoskeletal: Strength & Muscle Tone:  UTA Gait & Station: normal Patient leans: N/A  Psychiatric Specialty Exam: Review of Systems  Psychiatric/Behavioral:  Positive for decreased concentration, dysphoric mood and sleep disturbance. The patient is nervous/anxious.   All other systems reviewed and are negative.  Blood pressure 137/84, pulse (!) 109, temperature 97.9 F (36.6 C), temperature source Temporal, height 5' 3.19" (1.605 m), weight (!) 342 lb 6.4 oz (155.3 kg).Body mass index is 60.29 kg/m.  General Appearance: Casual  Eye Contact:  Fair  Speech:  Clear and Coherent  Volume:  Normal  Mood:  Anxious and Depressed  Affect:  Congruent  Thought Process:  Goal Directed and Descriptions of Associations: Intact  Orientation:  Full (Time, Place, and Person)  Thought Content: Logical   Suicidal Thoughts:  No  Homicidal Thoughts:  No  Memory:  Immediate;   Fair Recent;   Fair Remote;   Fair  Judgement:  Fair  Insight:  Fair  Psychomotor Activity:  Normal  Concentration:  Concentration: Fair and Attention Span: Fair  Recall:  [employer] of Knowledge: Fair  Language: Fair  Akathisia:  No  Handed:  Right  AIMS (if indicated):  done  Assets:  Communication Skills Desire for Improvement Housing Talents/Skills Transportation Vocational/Educational  ADL's:  Intact  Cognition: WNL  Sleep:  Poor   Screenings: AIMS    Flowsheet Row Video Visit from 08/04/2020 in [facility] Video Visit from 03/01/2020 in [facility] Office Visit from 12/11/2017 in [facility] Office Visit from 11/29/2017 in [facility] Office Visit from 11/20/2017 in [facility]  AIMS Total Score 0 8 1 7 5       AUDIT    Flowsheet Row Admission (Discharged) from 05/21/2017 in [facility]  Alcohol Use Disorder Identification Test Final Score (AUDIT) 6      PHQ2-9    Flowsheet Row Video Visit from 08/04/2020 in [facility] Video Visit from 05/25/2020 in [facility] Video Visit from 03/01/2020 in [facility] Video Visit from 02/11/2020 in [facility]  PHQ-2 Total Score 6 3 4 5   PHQ-9 Total Score 20 5 14 16       Flowsheet Row Video Visit from 08/04/2020 in [facility] Video Visit from 03/01/2020 in [facility]  C-SSRS RISK CATEGORY Error: Q3, 4, or 5 should not be populated when Q2 is No No Risk        Assessment and Plan: Sierra [name] is a 40 year old Caucasian female, has a history of bipolar disorder, anxiety disorder, binge eating disorder was evaluated in office today.  Patient is currently struggling with depressive symptoms, sleep problems, has recent elevated creatinine level, increased urination at night.  She will benefit from the following plan. The patient demonstrates the following risk factors for suicide: Chronic risk factors for suicide include: psychiatric disorder of bipolar disorder, previous  self-harm yes - has a history of suicide attempt 3 years ago, and completed suicide in a family member. Acute risk factors for suicide include: family or marital conflict and spouse's medical problems . Protective factors for this patient include: positive therapeutic relationship, responsibility to others (children, family), and hope for the future. Considering these factors, the overall suicide risk at this point appears to be low. Patient is appropriate for outpatient follow up.   Plan Bipolar disorder type I depressed-unstable Continue Latuda 80 mg p.o. daily Increase Lamictal to 175 mg p.o. daily Taper of lithium, start taking lithium 150 mg p.o. daily for the next 1 week and stop taking it Gabapentin 400 mg p.o. nightly Sonata 10 mg p.o. nightly as needed  GAD-unstable Gabapentin 400 mg p.o. nightly Hydroxyzine 25 mg p.o. daily as needed for severe anxiety attacks Patient also has Klonopin 0.5 mg as needed available. Patient to continue to follow-up with her therapist-Ms. Tiffany[facility].  ADHD-unstable Reduced Dexedrine to 20 mg p.o. daily Dose reduced due to her current irritability, sleep problems.  Her concentration problems likely affected by her current mood, worsening sleep problems.  Binge eating disorder-stable Will monitor closely  Elevated creatinine level-we will repeat BUN/creatinine.  Advised patient to go to [facility] and get it done.  I have ordered labs and provided her with a lab slip.  I have contacted [facility]-at [phone] message with her therapist Ms. Tiffany's voicemail.  We will coordinate care.  Provided patient information for [facility] urgent care.  Crisis plan discussed with patient.  Follow-up in clinic in 1 week in office.  This note was generated in part or whole with voice recognition software. Voice recognition is usually quite accurate but there are transcription errors that can and very often do  occur. I apologize for any typographical errors that were not detected and corrected.      Jomarie Longs, MD 08/05/2020, 9:58 AM

## 2020-08-05 ENCOUNTER — Other Ambulatory Visit
Admission: RE | Admit: 2020-08-05 | Discharge: 2020-08-05 | Disposition: A | Discharge status: Home / Self Care | Attending: Psychiatry | Admitting: Psychiatry

## 2020-08-05 DIAGNOSIS — F900 Attention-deficit hyperactivity disorder, predominantly inattentive type: Secondary | ICD-10-CM | POA: Diagnosis not present

## 2020-08-05 DIAGNOSIS — F3132 Bipolar disorder, current episode depressed, moderate: Secondary | ICD-10-CM | POA: Insufficient documentation

## 2020-08-05 DIAGNOSIS — Z79899 Other long term (current) drug therapy: Secondary | ICD-10-CM | POA: Insufficient documentation

## 2020-08-05 DIAGNOSIS — Z5181 Encounter for therapeutic drug level monitoring: Secondary | ICD-10-CM | POA: Insufficient documentation

## 2020-08-05 DIAGNOSIS — F411 Generalized anxiety disorder: Secondary | ICD-10-CM | POA: Insufficient documentation

## 2020-08-05 LAB — CREATININE, SERUM
Creatinine, Ser: 0.91 mg/dL (ref 0.44–1.00)
GFR, Estimated: >60 mL/min (ref >60)

## 2020-08-05 LAB — BUN: BUN: 12 mg/dL (ref 6–20)

## 2020-08-08 NOTE — Telephone Encounter (Signed)
Spoke with patient regarding surgery benefits. Patient acknowledges understanding of information presented. Patient is aware that benefits presented are professional benefits only. Patient is aware that once surgery is scheduled, the hospital will call with separate benefits. See account note.  Routing to Jill Hamm, RN, for surgery scheduling. 

## 2020-08-08 NOTE — Telephone Encounter (Signed)
Left message to call Daleon Willinger, RN at [facility], [phone].  

## 2020-08-09 NOTE — Telephone Encounter (Signed)
Call placed to patient to review pre-op instructions.   Spoke with patient. Patient request to cancel Colpo/LEEP scheduled for 08/30/20. Patient states she does not have a driver and does not know when she will have a driver. Declines to reschedule at this time. Declines reviewing option of Colpo/LEEP in office. Patient does not want to review future dates at this time, she will return call when she wants to reschedule. Advised patient I will cancel surgery and notify Dr. Seymour Bars. I will return call if any additional recommendations. Patient agreeable.    Call placed to central scheduling, spoke with Selena Batten, surgery cancelled.   Routing to Dr. Seymour Bars.   Cc: [facility]

## 2020-08-09 NOTE — Telephone Encounter (Signed)
Genia Del, MD  You 1 hour ago (9:56 AM)     The last Pap was 02/2020 LGSIL.  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    Given patient's anxiety/Canceled [facility] Colpo/LEEP, would be time to repeat a Pap test.  Please offer to schedule a repeat Pap in 08/2020.  Just want to recommend that to keep her safe health wise.  Dr Seymour Bars     Left message to call Noreene Larsson, RN at [place], [phone].

## 2020-08-10 ENCOUNTER — Encounter: Payer: Self-pay | Admitting: Psychiatry

## 2020-08-10 ENCOUNTER — Other Ambulatory Visit: Payer: Self-pay

## 2020-08-10 ENCOUNTER — Ambulatory Visit (INDEPENDENT_AMBULATORY_CARE_PROVIDER_SITE_OTHER): Admitting: Psychiatry

## 2020-08-10 VITALS — BP 141/96 | HR 96 | Temp 97.2°F | Wt 342.8 lb

## 2020-08-10 DIAGNOSIS — R030 Elevated blood-pressure reading, without diagnosis of hypertension: Secondary | ICD-10-CM

## 2020-08-10 DIAGNOSIS — F5081 Binge eating disorder: Secondary | ICD-10-CM

## 2020-08-10 DIAGNOSIS — F900 Attention-deficit hyperactivity disorder, predominantly inattentive type: Secondary | ICD-10-CM | POA: Diagnosis not present

## 2020-08-10 DIAGNOSIS — F411 Generalized anxiety disorder: Secondary | ICD-10-CM | POA: Diagnosis not present

## 2020-08-10 DIAGNOSIS — Z79899 Other long term (current) drug therapy: Secondary | ICD-10-CM

## 2020-08-10 DIAGNOSIS — F50819 Binge eating disorder, unspecified: Secondary | ICD-10-CM

## 2020-08-10 DIAGNOSIS — G4701 Insomnia due to medical condition: Secondary | ICD-10-CM

## 2020-08-10 DIAGNOSIS — F3132 Bipolar disorder, current episode depressed, moderate: Secondary | ICD-10-CM | POA: Diagnosis not present

## 2020-08-10 MED ORDER — ATOMOXETINE HCL 18 MG PO CAPS: 18.0000 mg | ORAL_CAPSULE | Freq: Every day | ORAL | 0 refills | Status: DC

## 2020-08-10 NOTE — Telephone Encounter (Signed)
Spoke with patient. Advised per Dr. Seymour Bars.  OV scheduled for repeat pap on 8/12 at 1330. Patient verbalizes understanding and is agreeable.   Encounter closed.

## 2020-08-10 NOTE — Progress Notes (Signed)
BH MD OP Progress Note  08/10/2020 1:18 PM Sierra GrippeChristina D Buzzelli  MRN:  [id number]  Chief Complaint:  Chief Complaint   Follow-up    HPI: Sierra [name] is a 40 year old Caucasian female, married, [place], has a history of bipolar disorder, GAD, binge eating disorder, ADHD, obesity, history of bariatric surgery was evaluated in office today.  Patient today reports she is making progress compared to how she did few days ago.  She reports her sadness and irritability as improving.  She was able to sleep 7 hours last night.  She reports her sleep has improved and she continues to take [employer].  She reports she stopped taking the Dexedrine for a few days since she felt it was not helpful.  She is also tolerating being off of the lithium.  Since stopping the lithium her dry mouth as well as frequent urination at night have improved.  Patient denies any suicidality, however does report death wish on and off.  Denies any suicidal plan.  Patient denies any homicidality or perceptual disturbances.  She reports she is constantly worried about her psychosocial stressors, husband's health problems.  His mom is supportive.  She reports she will start school again on August 15 and that also worries her.  Patient reports she is interested in starting a nonstimulant medication for ADHD if possible.  Discussed Strattera and she is agreeable.  Patient had her creatinine and BUN level done recently, reviewed and discussed the same with her.  Patient denies any other concerns today.  Visit Diagnosis:    ICD-10-CM   1. Bipolar 1 disorder, depressed, moderate (HCC)  F31.32     2. GAD (generalized anxiety disorder)  F41.1     3. Attention deficit hyperactivity disorder (ADHD), predominantly inattentive type  F90.0 atomoxetine (STRATTERA) 18 MG capsule    4. Insomnia due to medical condition  G47.01     5. Binge eating disorder  F50.81     6. High risk medication use  Z79.899     7.  Elevated blood pressure reading  R03.0       Past Psychiatric History: Reviewed past psychiatric history from progress note on 06/20/2017.  Past Medical History:  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   Anemia    Anxiety state, unspecified    Bipolar 1 disorder, depressed (HCC) 05/21/2017   BIPOLAR AFFECTIVE DISORDER 12/23/2006   Qualifier: Diagnosis of  By: Ermalene SearingBedsole MD, Amy     Bipolar affective disorder (HCC) 10/12/2011   Bipolar disorder, unspecified (HCC)    Depressive disorder, not elsewhere classified    Edema    Esophageal reflux    Fibromyalgia    Headache    History of kidney stones    Mild or unspecified pre-eclampsia, unspecified as to episode of care    Morbid obesity (HCC)    Myalgia and myositis, unspecified    Other dyspnea and respiratory abnormality    Preeclampsia    with first pregnancy   Raynaud's disease    Urinary tract infection, site not specified     Past Surgical History:  Procedure Laterality Date   CESAREAN SECTION     X 2   CHOLECYSTECTOMY  03/2006   CYSTOSCOPY W/ URETERAL STENT PLACEMENT Left 05/14/2016   Procedure: CYSTOSCOPY WITH STENT REPLACEMENT;  Surgeon: Vanna ScotlandAshley Brandon, MD;  Location: [facility];  Service: Urology;  Laterality: Left;  CYSTOSCOPY WITH STENT PLACEMENT Left 04/25/2016   Procedure: CYSTOSCOPY WITH STENT PLACEMENT;  Surgeon: [name], MD;  Location: [facility];  Service: Urology;  Laterality: Left;   DILATION AND CURETTAGE OF UTERUS     IVC FILTER INSERTION  03/26/2016   [facility]   LAPAROSCOPIC GASTRIC RESTRICTIVE DUODENAL PROCEDURE (DUODENAL SWITCH)  03/2016   LITHOTRIPSY     TONSILLECTOMY AND ADENOIDECTOMY     URETEROSCOPY WITH HOLMIUM LASER LITHOTRIPSY Left 05/14/2016   Procedure: URETEROSCOPY WITH HOLMIUM LASER LITHOTRIPSY;  Surgeon: [name], MD;  Location: [facility];  Service: Urology;  Laterality: Left;    Family  Psychiatric History: Reviewed family psychiatric history from progress note on 06/20/2017.  Family History:  Family History  Problem Relation Age of Onset   Depression Father    Alcohol abuse Father    Depression Mother    Hyperlipidemia Mother    Sleep apnea Mother    Depression Sister    Hyperlipidemia Brother    Depression Brother    Hyperlipidemia Brother    Depression Brother    Alcohol abuse Brother    Coronary artery disease Maternal Grandmother    Heart attack Maternal Grandmother    Diabetes Maternal Grandmother    Lung cancer Maternal Grandfather    Emphysema Maternal Grandfather    Coronary artery disease Maternal Grandfather    Lupus Other        Aunt   Fibromyalgia Other        Aunt    Social History: Reviewed social history from progress note on 06/20/2017. Social History   Socioeconomic History   Marital status: Married    Spouse name: brian   Number of children: 2   Years of education: Not on file   Highest education level: Some college, no degree  Occupational History   Occupation: Arts development officer  Tobacco Use   Smoking status: Never   Smokeless tobacco: Never  Vaping Use   Vaping Use: Never used  Substance and Sexual Activity   Alcohol use: Yes    Alcohol/week: 0.0 standard drinks    Comment: occassional   Drug use: No   Sexual activity: Yes    Partners: Male    Birth control/protection: None, I.U.D.  Other Topics Concern   Not on file  Social History Narrative   1 child, 2 step-sons      Regular exercise-no   Diet: no fast food, likes sweets   Social Determinants of Health   Financial Resource Strain: Not on file  Food Insecurity: Not on file  Transportation Needs: Not on file  Physical Activity: Not on file  Stress: Not on file  Social Connections: Not on file    Allergies:  Allergies  Allergen Reactions   Uncaria Tomentosa (Cats Claw) Other (See Comments)    allgeric to cats in general causes sneezing   Bee Pollen Itching   Molds  & Smuts Other (See Comments)   Pollen Extract    Milk-Related Compounds     Able to tolerate yogurt; mild intolerance to milk/cheese   Pineapple Rash   Soy Allergy Diarrhea   Strawberry Extract Rash    Break out on tongue noted Break out on tongue noted Break out on tongue noted    Metabolic Disorder Labs: Lab Results  Component Value Date   HGBA1C 4.7 (L) 05/22/2017   MPG 88.19 05/22/2017   Lab Results  Component Value Date   PROLACTIN 7.6 11/07/2018   Lab Results  Component Value Date  CHOL 105 05/22/2017   TRIG 41 05/22/2017   HDL 48 05/22/2017   CHOLHDL 2.2 05/22/2017   VLDL 8 05/22/2017   LDLCALC 49 05/22/2017   LDLCALC 99 09/11/2013   Lab Results  Component Value Date   TSH 0.504 05/21/2018   TSH 0.677 05/22/2017    Therapeutic Level Labs: Lab Results  Component Value Date   LITHIUM 0.17 (L) 06/27/2020   LITHIUM 0.12 (L) 06/01/2020   No results found for: VALPROATE No components found for:  CBMZ  Current Medications: Current Outpatient Medications  Medication Sig Dispense Refill   atomoxetine (STRATTERA) 18 MG capsule Take 1 capsule (18 mg total) by mouth daily. 15 capsule 0   Biotin 5000 MCG TABS Take 1 tablet by mouth daily.     BLISOVI 24 FE 1-20 MG-MCG(24) tablet TAKE 1 TABLET BY MOUTH EVERY DAY 84 tablet 4   calcium citrate-vitamin D 500-400 MG-UNIT chewable tablet Chew 1 tablet by mouth daily.      clonazePAM (KLONOPIN) 0.5 MG tablet Take 1 tablet (0.5 mg total) by mouth as directed. Take once every few days for severe panic attacks only. 10 tablet 0   cyclobenzaprine (FLEXERIL) 10 MG tablet Take 10 mg by mouth 2 (two) times daily as needed for muscle spasms.     ferrous sulfate 325 (65 FE) MG tablet Take 325 mg by mouth daily with breakfast.     gabapentin (NEURONTIN) 100 MG capsule Take 4 capsules (400 mg total) by mouth at bedtime. 360 capsule 1   hydrOXYzine (ATARAX/VISTARIL) 25 MG tablet Take 1 tablet (25 mg total) by mouth daily as needed  for anxiety. 90 tablet 1   lamoTRIgine (LAMICTAL) 150 MG tablet Take 1 tablet (150 mg total) by mouth daily. Start taking with 25 mg daily 90 tablet 0   lamoTRIgine (LAMICTAL) 25 MG tablet Take 1 tablet (25 mg total) by mouth daily. To be added to 150 mg daily 90 tablet 0   LATUDA 80 MG TABS tablet TAKE ONE TABLET BY MOUTH EVERY DAY WITH SUPPER ** DOSE INCREASE 30 tablet 1   levonorgestrel (MIRENA) 20 MCG/24HR IUD 1 each by Intrauterine route once.     Milnacipran (SAVELLA) 50 MG TABS tablet Take 50 mg by mouth 2 (two) times daily. Takes differently     montelukast (SINGULAIR) 10 MG tablet Take 10 mg by mouth daily.     Multiple Vitamin (MULTIVITAMIN) tablet Take 1 tablet by mouth daily.     NIFEdipine (PROCARDIA-XL/ADALAT CC) 60 MG 24 hr tablet Take 60 mg by mouth daily.      omeprazole (PRILOSEC) 20 MG capsule Take 20 mg by mouth daily.     zaleplon (SONATA) 10 MG capsule Take 1 capsule (10 mg total) by mouth at bedtime as needed. 30 capsule 2   docusate sodium (COLACE) 100 MG capsule Take 300 mg by mouth daily. (Patient not taking: No sig reported)     No current facility-administered medications for this visit.     Musculoskeletal: Strength & Muscle Tone:  WNL Gait & Station: normal Patient leans: N/A  Psychiatric Specialty Exam: Review of Systems  Psychiatric/Behavioral:  Positive for decreased concentration and dysphoric mood. The patient is nervous/anxious.   All other systems reviewed and are negative.  Blood pressure (!) 141/96, pulse 96, temperature (!) 97.2 F (36.2 C), temperature source Temporal, weight (!) 342 lb 12.8 oz (155.5 kg).Body mass index is 60.36 kg/m.  General Appearance: Fairly Groomed  Eye Contact:  Fair  Speech:  Clear  and Coherent  Volume:  Normal  Mood:  Anxious and Depressed  Affect:  Congruent  Thought Process:  Goal Directed and Descriptions of Associations: Intact  Orientation:  Full (Time, Place, and Person)  Thought Content: Logical   Suicidal  Thoughts:  No  Homicidal Thoughts:  No  Memory:  Immediate;   Fair Recent;   Fair Remote;   Fair  Judgement:  Fair  Insight:  Fair  Psychomotor Activity:  Tremor  Concentration:  Concentration: Fair and Attention Span: Fair  Recall:  [employer] of Knowledge: Fair  Language: Fair  Akathisia:  No  Handed:  Right  AIMS (if indicated): done  Assets:  Communication Skills Desire for Improvement Housing Intimacy Social Support Talents/Skills Transportation Vocational/Educational  ADL's:  Intact  Cognition: WNL  Sleep:   Improving   Screenings: AIMS    Flowsheet Row Video Visit from 08/04/2020 in [facility] Video Visit from 03/01/2020 in [facility] Office Visit from 12/11/2017 in [facility] Office Visit from 11/29/2017 in [facility] Office Visit from 11/20/2017 in [facility]  AIMS Total Score 0 AUDIT    Flowsheet Row Admission (Discharged) from 05/21/2017 in [facility]  Alcohol Use Disorder Identification Test Final Score (AUDIT) 6      PHQ2-9    Flowsheet Row Office Visit from 08/10/2020 in [facility] Video Visit from 08/04/2020 in [facility] Video Visit from 05/25/2020 in [facility] Video Visit from 03/01/2020 in [facility] Video Visit from 02/11/2020 in [facility]  PHQ-2 Total Score PHQ-9 Total Score Flowsheet Row Office Visit from 08/10/2020 in [facility] Video Visit from 08/04/2020 in [facility] Video Visit from 03/01/2020 in [facility]  C-SSRS RISK CATEGORY Error: Q3, 4, or 5 should not be populated when Q2 is No Error: Q3, 4, or 5  should not be populated when Q2 is No No Risk        Assessment and Plan: Sierra [name] is a 40 year old Caucasian female, has a history of bipolar disorder, anxiety disorder, binge eating disorder was evaluated in office today.  Patient with recent depressive symptoms, sleep problems, elevated creatinine level, currently making progress.  She is currently improving on the current medication regimen although she continues to struggle with concentration.  Discussed plan as noted below.  Plan Bipolar disorder type I depressed-improving Latuda 80 mg p.o. daily with meals Lamictal 175 mg p.o. daily Gabapentin 400 mg p.o. nightly Sonata 10 mg p.o. nightly as needed  GAD-improving Gabapentin as prescribed Hydroxyzine 25 mg p.o. daily as needed for severe anxiety attacks Klonopin 0.5 mg as needed for severe anxiety attacks only, she rarely uses it. Continue CBT with a therapist Ms.Tiffany - at [facility].  ADHD-unstable Discontinue Dexedrine for lack of benefit. Start Strattera 18 mg p.o. daily  Binge eating disorder-stable We will monitor closely  High risk medication use-reviewed and discussed labs-creatinine level, BUN-dated 08/05/2020-within normal limits.  Elevated blood pressure reading-discussed with patient to monitor her blood pressure, keep a log.  Discussed the effect of Strattera on her blood pressure.  Patient to follow up with primary care provider.  We will coordinate care with her therapist.  Follow-up in clinic in 2 weeks  in office.  This note was generated in part or whole with voice recognition software. Voice recognition is usually quite accurate but there are transcription errors that can and very often do occur. I apologize for any typographical errors that were not detected and corrected.      Jomarie Longs, MD 08/12/2020, 2:03 PM

## 2020-08-10 NOTE — Patient Instructions (Signed)
Atomoxetine Capsules What is this medication? ATOMOXETINE (AT oh mox e teen) treats attention-deficit hyperactivity disorder (ADHD). It works by improving focus and reducing impulsive behavior. It belongsto a group of medications called SNRIs. This medicine may be used for other purposes; ask your health care provider orpharmacist if you have questions. COMMON BRAND NAME(S): Strattera What should I tell my care team before I take this medication? They need to know if you have any of these conditions: Glaucoma High or low blood pressure History of stroke Irregular heartbeat or other cardiac disease Liver disease Mania or bipolar disorder Pheochromocytoma Suicidal thoughts An unusual or allergic reaction to atomoxetine, other medications, foods, dyes, or preservatives Pregnant or trying to get pregnant Breast-feeding How should I use this medication? Take this medication by mouth with a glass of water. Follow the directions on the prescription label. You can take it with or without food. If it upsets your stomach, take it with food. If you have difficulty sleeping, and you take more than 1 dose per day, take your last dose before 6 PM. Take your medication at regular intervals. Do not take it more often than directed. Do not stop takingexcept on your care team's advice. A special MedGuide will be given to you by the pharmacist with eachprescription and refill. Be sure to read this information carefully each time. Talk to your care team regarding the use of this medication in children. While this medication may be prescribed for children as young as 6 years for selectedconditions, precautions do apply. Overdosage: If you think you have taken too much of this medicine contact apoison control center or emergency room at once. NOTE: This medicine is only for you. Do not share this medicine with others. What if I miss a dose? If you miss a dose, take it as soon as you can. If it is almost time for  yournext dose, take only that dose. Do not take double or extra doses. What may interact with this medication? Do not take this medication with any of the following: Cisapride Dronedarone MAOIs like Carbex, Eldepryl, Marplan, Nardil, and Parnate Pimozide Reboxetine Thioridazine This medication may also interact with the following: Certain medications for blood pressure, heart disease, irregular heart beat Certain medications for depression, anxiety, or psychotic disturbances Certain medications for lung disease like albuterol Cold or allergy medications Dofetilide Fluoxetine Medications that increase blood pressure like dopamine, dobutamine, or ephedrine Other medications that prolong the QT interval (cause an abnormal heart rhythm) Paroxetine Quinidine Stimulant medications for attention disorders, weight loss, or to stay awake Ziprasidone This list may not describe all possible interactions. Give your health care provider a list of all the medicines, herbs, non-prescription drugs, or dietary supplements you use. Also tell them if you smoke, drink alcohol, or use illegaldrugs. Some items may interact with your medicine. What should I watch for while using this medication? It may take a week or more for this medication to take effect. This is why it is very important to continue taking the medication and not miss any doses. If you have been taking this medication regularly for some time, do not suddenlystop taking it. Ask your care team for advice. Rarely, this medication may increase thoughts of suicide or suicide attempts in children and teenagers. Call your child's health care professional right away if your child or teenager has new or increased thoughts of suicide or has changes in mood or behavior like becoming irritable or anxious. Regularlymonitor your child for these behavioral changes. For  males, contact you care team right away if you have an erection that lasts longer than 4  hours or if it becomes painful. This may be a sign of seriousproblem and must be treated right away to prevent permanent damage. You may get drowsy or dizzy. Do not drive, use machinery, or do anything that needs mental alertness until you know how this medication affects you. Do not stand or sit up quickly, especially if you are an older patient. This reduces the risk of dizzy or fainting spells. Alcohol can make you more drowsy anddizzy. Avoid alcoholic drinks. Do not treat yourself for coughs, colds or allergies without asking your careteam for advice. Some ingredients can increase possible side effects. Your mouth may get dry. Chewing sugarless gum or sucking hard candy, anddrinking plenty of water will help. What side effects may I notice from receiving this medication? Side effects that you should report to your care team as soon as possible: Allergic reactions-skin rash, itching, hives, swelling of the face, lips, tongue, or throat Heart rhythm changes-fast or irregular heartbeat, dizziness, feeling faint or lightheaded, chest pain, trouble breathing Increase in blood pressure Liver injury- right upper belly pain, loss of appetite, nausea, light-colored stool, dark yellow or brown urine, yellowing skin or eyes, unusual weakness or fatigue Mood and behavior changes-anxiety, nervousness, confusion, hallucinations, irritability, hostility, thoughts of suicide or self-harm, worsening mood, feelings of depression Painful or prolonged erection Stroke in adults-sudden numbness or weakness of the face, arm, or leg, trouble speaking, confusion, trouble walking, loss of balance or coordination, dizziness, severe headache, change in vision Trouble passing urine Side effects that usually do not require medical attention (report to your careteam if they continue or are bothersome): Change in sex drive or performance Constipation Dizziness Dry mouth Loss of appetite Nausea Stomach pain Vomiting This  list may not describe all possible side effects. Call your doctor for medical advice about side effects. You may report side effects to FDA at1-800-FDA-1088. Where should I keep my medication? Keep out of the reach of children and pets. Store at room temperature between 15 and 30 degrees C (59 and 86 degrees F).Throw away any unused medication after the expiration date. NOTE: This sheet is a summary. It may not cover all possible information. If you have questions about this medicine, talk to your doctor, pharmacist, orhealth care provider.  2022 Elsevier/Gold Standard (2020-01-20 15:27:29)  

## 2020-08-18 ENCOUNTER — Other Ambulatory Visit: Payer: Self-pay | Admitting: Psychiatry

## 2020-08-18 DIAGNOSIS — F900 Attention-deficit hyperactivity disorder, predominantly inattentive type: Secondary | ICD-10-CM

## 2020-08-19 ENCOUNTER — Ambulatory Visit: Admitting: Obstetrics & Gynecology

## 2020-08-23 ENCOUNTER — Encounter: Payer: Self-pay | Admitting: Psychiatry

## 2020-08-23 ENCOUNTER — Other Ambulatory Visit: Payer: Self-pay

## 2020-08-23 ENCOUNTER — Ambulatory Visit (INDEPENDENT_AMBULATORY_CARE_PROVIDER_SITE_OTHER): Admitting: Psychiatry

## 2020-08-23 VITALS — BP 125/79 | HR 114 | Temp 97.7°F | Wt 339.8 lb

## 2020-08-23 DIAGNOSIS — F411 Generalized anxiety disorder: Secondary | ICD-10-CM

## 2020-08-23 DIAGNOSIS — F3132 Bipolar disorder, current episode depressed, moderate: Secondary | ICD-10-CM | POA: Diagnosis not present

## 2020-08-23 DIAGNOSIS — F900 Attention-deficit hyperactivity disorder, predominantly inattentive type: Secondary | ICD-10-CM

## 2020-08-23 DIAGNOSIS — R000 Tachycardia, unspecified: Secondary | ICD-10-CM

## 2020-08-23 DIAGNOSIS — G4701 Insomnia due to medical condition: Secondary | ICD-10-CM

## 2020-08-23 DIAGNOSIS — F5081 Binge eating disorder: Secondary | ICD-10-CM

## 2020-08-23 MED ORDER — ATOMOXETINE HCL 25 MG PO CAPS: 25.0000 mg | ORAL_CAPSULE | Freq: Every day | ORAL | 1 refills | Status: DC

## 2020-08-23 MED ORDER — LATUDA 60 MG PO TABS: 60.0000 mg | ORAL_TABLET | Freq: Every day | ORAL | 0 refills | Status: DC

## 2020-08-23 NOTE — Progress Notes (Signed)
BH MD OP Progress Note  08/23/2020 1:28 PM Sierra [name]  MRN:  [id number]  Chief Complaint:  Chief Complaint   Follow-up; Anxiety; Depression    HPI: Sierra [name] is a 40 year old Caucasian female, married, lives in [place], has a history of bipolar disorder, GAD, binge eating disorder, ADHD, obesity, history of bariatric surgery was evaluated in office today.  Patient today reports she feels much better since her last visit with regards to her mood.  She does not feel as irritable and denies significant sadness.  She reports she has been motivated to do more things.  She also reports sleep has improved.  Patient continues to take her medications as prescribed.  Patient denies side effects.  She denies any suicidality, homicidality or perceptual disturbances.  Patient reports she however does continue to struggle with her attention and focus and reports Strattera does not help much at this dose.  She however denies side effects.  Patient reports her husband is currently away, went to spend time with his mother.  That has helped a lot and she reports her stress level has decreased since then.  She however reports she is going to start school soon for the fall semester at [employer].  That does worry her since she does not know how she will cope with school as well as everything else that she has to take care of at home now that her husband is sick with seizure disorder and she is doing a lot more around the house as well as taking everybody for their appointments and other things.  She does have tachycardia, however reports she has had it for quite a long time.  She does not believe this was evaluated by a cardiologist in the past.  She however agrees to be referred for the same.  Visit Diagnosis:    ICD-10-CM   1. Bipolar 1 disorder, depressed, moderate (HCC)  F31.32 Lurasidone HCl (LATUDA) 60 MG TABS    2. GAD (generalized anxiety disorder)  F41.1     3. Attention deficit  hyperactivity disorder (ADHD), predominantly inattentive type  F90.0 atomoxetine (STRATTERA) 25 MG capsule    4. Insomnia due to medical condition  G47.01    Mood , pain, urination    5. Binge eating disorder  F50.81     6. Tachycardia  R00.0       Past Psychiatric History: Reviewed past psychiatric history from progress note on 06/20/2017  Past Medical History:  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   Anemia    Anxiety state, unspecified    Bipolar 1 disorder, depressed (HCC) 05/21/2017   BIPOLAR AFFECTIVE DISORDER 12/23/2006   Qualifier: Diagnosis of  By: Ermalene Searing MD, Sierra [name]     Bipolar affective disorder (HCC) 10/12/2011   Bipolar disorder, unspecified (HCC)    Depressive disorder, not elsewhere classified    Edema    Esophageal reflux    Fibromyalgia    Headache    History of kidney stones    Mild or unspecified pre-eclampsia, unspecified as to episode of care    Morbid obesity (HCC)    Myalgia and myositis, unspecified    Other dyspnea and respiratory abnormality    Preeclampsia    with first pregnancy   Raynaud's disease    Urinary tract infection, site not specified     Past Surgical History:  Procedure Laterality Date  CESAREAN SECTION     X 2   CHOLECYSTECTOMY  03/2006   CYSTOSCOPY W/ URETERAL STENT PLACEMENT Left 05/14/2016   Procedure: CYSTOSCOPY WITH STENT REPLACEMENT;  Surgeon: Sierra ScotlandAshley Brandon, MD;  Location: [facility];  Service: Urology;  Laterality: Left;   CYSTOSCOPY WITH STENT PLACEMENT Left 04/25/2016   Procedure: CYSTOSCOPY WITH STENT PLACEMENT;  Surgeon: Sierra ScotlandAshley Brandon, MD;  Location: [facility];  Service: Urology;  Laterality: Left;   DILATION AND CURETTAGE OF UTERUS     IVC FILTER INSERTION  03/26/2016   [facility]   LAPAROSCOPIC GASTRIC RESTRICTIVE DUODENAL PROCEDURE (DUODENAL SWITCH)  03/2016   LITHOTRIPSY     TONSILLECTOMY AND ADENOIDECTOMY     URETEROSCOPY  WITH HOLMIUM LASER LITHOTRIPSY Left 05/14/2016   Procedure: URETEROSCOPY WITH HOLMIUM LASER LITHOTRIPSY;  Surgeon: Sierra ScotlandAshley Brandon, MD;  Location: [facility];  Service: Urology;  Laterality: Left;    Family Psychiatric History: Reviewed family psychiatric history from progress note on 06/20/2017  Family History:  Family History  Problem Relation Age of Onset   Depression Father    Alcohol abuse Father    Depression Mother    Hyperlipidemia Mother    Sleep apnea Mother    Depression Sister    Hyperlipidemia Brother    Depression Brother    Hyperlipidemia Brother    Depression Brother    Alcohol abuse Brother    Coronary artery disease Maternal Grandmother    Heart attack Maternal Grandmother    Diabetes Maternal Grandmother    Lung cancer Maternal Grandfather    Emphysema Maternal Grandfather    Coronary artery disease Maternal Grandfather    Lupus Other        Aunt   Fibromyalgia Other        Aunt    Social History: Reviewed social history from progress note on 06/20/2017 Social History   Socioeconomic History   Marital status: Married    Spouse name: Sierra [name]   Number of children: 2   Years of education: Not on file   Highest education level: Some college, no degree  Occupational History   Occupation: Arts development officerHome maker  Tobacco Use   Smoking status: Never   Smokeless tobacco: Never  Vaping Use   Vaping Use: Never used  Substance and Sexual Activity   Alcohol use: Yes    Alcohol/week: 0.0 standard drinks    Comment: occassional   Drug use: No   Sexual activity: Yes    Partners: Male    Birth control/protection: None, I.U.D.  Other Topics Concern   Not on file  Social History Narrative   1 child, 2 step-sons      Regular exercise-no   Diet: no fast food, likes sweets   Social Determinants of Health   Financial Resource Strain: Not on file  Food Insecurity: Not on file  Transportation Needs: Not on file  Physical Activity: Not on file  Stress: Not on file  Social  Connections: Not on file    Allergies:  Allergies  Allergen Reactions   Uncaria Tomentosa (Cats Claw) Other (See Comments)    allgeric to cats in general causes sneezing   Bee Pollen Itching   Molds & Smuts Other (See Comments)   Pollen Extract    Milk-Related Compounds     Able to tolerate yogurt; mild intolerance to milk/cheese   Pineapple Rash   Soy Allergy Diarrhea   Strawberry Extract Rash    Break out on tongue noted Break out on tongue noted Break out on tongue noted  Metabolic Disorder Labs: Lab Results  Component Value Date   HGBA1C 4.7 (L) 05/22/2017   MPG 88.19 05/22/2017   Lab Results  Component Value Date   PROLACTIN 7.6 11/07/2018   Lab Results  Component Value Date   CHOL 105 05/22/2017   TRIG 41 05/22/2017   HDL 48 05/22/2017   CHOLHDL 2.2 05/22/2017   VLDL 8 05/22/2017   LDLCALC 49 05/22/2017   LDLCALC 99 09/11/2013   Lab Results  Component Value Date   TSH 0.504 05/21/2018   TSH 0.677 05/22/2017    Therapeutic Level Labs: Lab Results  Component Value Date   LITHIUM 0.17 (L) 06/27/2020   LITHIUM 0.12 (L) 06/01/2020   No results found for: VALPROATE No components found for:  CBMZ  Current Medications: Current Outpatient Medications  Medication Sig Dispense Refill   atomoxetine (STRATTERA) 25 MG capsule Take 1 capsule (25 mg total) by mouth daily. 30 capsule 1   Biotin 5000 MCG TABS Take 1 tablet by mouth daily.     BLISOVI 24 FE 1-20 MG-MCG(24) tablet TAKE 1 TABLET BY MOUTH EVERY DAY 84 tablet 4   calcium citrate-vitamin D 500-400 MG-UNIT chewable tablet Chew 1 tablet by mouth daily.      cetirizine (ZYRTEC) 10 MG tablet Take 10 mg by mouth daily.     clonazePAM (KLONOPIN) 0.5 MG tablet Take 1 tablet (0.5 mg total) by mouth as directed. Take once every few days for severe panic attacks only. 10 tablet 0   cyclobenzaprine (FLEXERIL) 10 MG tablet Take 10 mg by mouth 2 (two) times daily as needed for muscle spasms.     docusate sodium  (COLACE) 100 MG capsule Take 300 mg by mouth daily.     ferrous sulfate 325 (65 FE) MG tablet Take 325 mg by mouth daily with breakfast.     gabapentin (NEURONTIN) 100 MG capsule Take 4 capsules (400 mg total) by mouth at bedtime. 360 capsule 1   hydrOXYzine (ATARAX/VISTARIL) 25 MG tablet Take 1 tablet (25 mg total) by mouth daily as needed for anxiety. 90 tablet 1   lamoTRIgine (LAMICTAL) 150 MG tablet Take 1 tablet (150 mg total) by mouth daily. Start taking with 25 mg daily 90 tablet 0   lamoTRIgine (LAMICTAL) 25 MG tablet Take 1 tablet (25 mg total) by mouth daily. To be added to 150 mg daily 90 tablet 0   levonorgestrel (MIRENA) 20 MCG/24HR IUD 1 each by Intrauterine route once.     Lurasidone HCl (LATUDA) 60 MG TABS Take 1 tablet (60 mg total) by mouth daily with supper. Has supplies 90 tablet 0   Milnacipran (SAVELLA) 50 MG TABS tablet Take 50 mg by mouth 2 (two) times daily. Takes differently     montelukast (SINGULAIR) 10 MG tablet Take 10 mg by mouth daily.     Multiple Vitamin (MULTIVITAMIN) tablet Take 1 tablet by mouth daily.     NIFEdipine (PROCARDIA-XL/ADALAT CC) 60 MG 24 hr tablet Take 60 mg by mouth daily.      omeprazole (PRILOSEC) 20 MG capsule Take 20 mg by mouth daily.     zaleplon (SONATA) 10 MG capsule Take 1 capsule (10 mg total) by mouth at bedtime as needed. 30 capsule 2   No current facility-administered medications for this visit.     Musculoskeletal: Strength & Muscle Tone: within normal limits Gait & Station: normal Patient leans: N/A  Psychiatric Specialty Exam: Review of Systems  Psychiatric/Behavioral:  The patient is nervous/anxious.   All other  systems reviewed and are negative.  Blood pressure 125/79, pulse (!) 114, temperature 97.7 F (36.5 C), temperature source Temporal, weight (!) 339 lb 12.8 oz (154.1 kg).Body mass index is 59.83 kg/m.  General Appearance: Casual  Eye Contact:  Fair  Speech:  Clear and Coherent  Volume:  Normal  Mood:   Anxious  Affect:  Congruent  Thought Process:  Goal Directed and Descriptions of Associations: Intact  Orientation:  Full (Time, Place, and Person)  Thought Content: Logical   Suicidal Thoughts:  No  Homicidal Thoughts:  No  Memory:  Immediate;   Fair Recent;   Fair Remote;   Fair  Judgement:  Fair  Insight:  Fair  Psychomotor Activity:  Normal  Concentration:  Concentration: Fair and Attention Span: Fair  Recall:  [employer] of Knowledge: Fair  Language: Fair  Akathisia:  No  Handed:  Right  AIMS (if indicated): done  Assets:  Communication Skills Desire for Improvement Housing Talents/Skills Transportation Vocational/Educational  ADL's:  Intact  Cognition: WNL  Sleep:   improving   Screenings: AIMS    Flowsheet Row Video Visit from 08/04/2020 in [facility] Video Visit from 03/01/2020 in [facility] Office Visit from 12/11/2017 in [facility] Office Visit from 11/29/2017 in [facility] Office Visit from 11/20/2017 in [facility]  AIMS Total Score 0 AUDIT    Flowsheet Row Admission (Discharged) from 05/21/2017 in [facility]  Alcohol Use Disorder Identification Test Final Score (AUDIT) 6      PHQ2-9    Flowsheet Row Office Visit from 08/23/2020 in [facility] Office Visit from 08/10/2020 in [facility] Video Visit from 08/04/2020 in [facility] Video Visit from 05/25/2020 in [facility] Video Visit from 03/01/2020 in [facility]  PHQ-2 Total Score PHQ-9 Total Score Flowsheet Row Office Visit from 08/23/2020 in [facility] Office Visit from 08/10/2020 in [facility] Video  Visit from 08/04/2020 in [facility]  C-SSRS RISK CATEGORY Error: Q3, 4, or 5 should not be populated when Q2 is No Error: Q3, 4, or 5 should not be populated when Q2 is No Error: Q3, 4, or 5 should not be populated when Q2 is No        Assessment and Plan: MONIGUE SPRAGGINS is a 40 year old Caucasian female who has a history of bipolar disorder, anxiety disorder, binge eating disorder was evaluated in office today.  Patient reports mood symptoms is improving however does report continued attention and focus problems.  She also has tachycardia and will need cardiology referral.  Plan as noted below.  Plan Bipolar disorder type I depressed-in partial remission We will reduce Latuda to 60 mg p.o. daily with meals Lamictal 175 mg p.o. daily Gabapentin 400 mg p.o. nightly Sonata 10 mg p.o. nightly as needed  GAD-improving Gabapentin as prescribed Hydroxyzine 25 mg p.o. daily as needed for severe anxiety attacks Klonopin 0.5 mg as needed for severe anxiety attacks only, she rarely uses it.  ADHD-unstable Increase Strattera to 25 mg p.o. daily Discussed side effects including the risk of tachycardia on medications like Strattera.  Binge eating disorder-stable We will monitor closely  Tachycardia-we will refer to cardiology.  Continue psychotherapy sessions.  With [facility]  Follow-up in clinic in 4 weeks in office.  This note was generated in part or whole with voice recognition software. Voice recognition is usually quite accurate but there are transcription errors that can and very often do occur. I apologize for any typographical errors that were not detected and corrected.      Jomarie Longs, MD 08/24/2020, 1:34 PM

## 2020-08-26 ENCOUNTER — Ambulatory Visit: Payer: Self-pay | Admitting: Obstetrics & Gynecology

## 2020-08-30 ENCOUNTER — Ambulatory Visit: Admit: 2020-08-30 | Admitting: Obstetrics & Gynecology

## 2020-08-30 SURGERY — LEEP (LOOP ELECTROSURGICAL EXCISION PROCEDURE)
Anesthesia: Choice

## 2020-09-05 ENCOUNTER — Telehealth: Payer: Self-pay

## 2020-09-05 NOTE — Telephone Encounter (Signed)
Please let her know that medication dosage can only be increased gradually , this dose needs more time. Also please ask her to follow up with cardiology regarding her tachycardia before we can make more medication changes . This was discussed in session .

## 2020-09-05 NOTE — Telephone Encounter (Signed)
pt called states that the strattera 25mg  she can not get her school work done, she states she reads the same line over and over and she cann't understand it.  she states she tired also. she states she needs something

## 2020-09-07 NOTE — Telephone Encounter (Signed)
pt had called again and stated that she needed something else she states that she can not sleep she tired she can't do her school work.

## 2020-09-07 NOTE — Telephone Encounter (Signed)
called pt and she stated that she doesn't have an appt with cardiology until the end of september.    she was told that the front desk will be calling her to set up a appt for tomorrow with dr. Elna Breslow.

## 2020-09-08 ENCOUNTER — Telehealth (INDEPENDENT_AMBULATORY_CARE_PROVIDER_SITE_OTHER): Admitting: Psychiatry

## 2020-09-08 ENCOUNTER — Encounter: Payer: Self-pay | Admitting: Psychiatry

## 2020-09-08 ENCOUNTER — Other Ambulatory Visit: Payer: Self-pay

## 2020-09-08 DIAGNOSIS — F411 Generalized anxiety disorder: Secondary | ICD-10-CM | POA: Diagnosis not present

## 2020-09-08 DIAGNOSIS — F3132 Bipolar disorder, current episode depressed, moderate: Secondary | ICD-10-CM | POA: Diagnosis not present

## 2020-09-08 DIAGNOSIS — G4701 Insomnia due to medical condition: Secondary | ICD-10-CM | POA: Diagnosis not present

## 2020-09-08 DIAGNOSIS — F900 Attention-deficit hyperactivity disorder, predominantly inattentive type: Secondary | ICD-10-CM | POA: Diagnosis not present

## 2020-09-08 DIAGNOSIS — F5081 Binge eating disorder: Secondary | ICD-10-CM

## 2020-09-08 MED ORDER — ATOMOXETINE HCL 40 MG PO CAPS: 40.0000 mg | ORAL_CAPSULE | Freq: Every day | ORAL | 1 refills | Status: DC

## 2020-09-08 MED ORDER — GABAPENTIN 100 MG PO CAPS: 400.0000 mg | ORAL_CAPSULE | Freq: Every day | ORAL | 1 refills | Status: DC

## 2020-09-08 MED ORDER — GABAPENTIN 100 MG PO CAPS: 400.0000 mg | ORAL_CAPSULE | Freq: Every day | ORAL | 0 refills | Status: DC

## 2020-09-08 MED ORDER — ZALEPLON 10 MG PO CAPS: 10.0000 mg | ORAL_CAPSULE | Freq: Every evening | ORAL | 2 refills | Status: DC | PRN

## 2020-09-08 MED ORDER — LATUDA 60 MG PO TABS: 60.0000 mg | ORAL_TABLET | Freq: Every day | ORAL | 0 refills | Status: DC

## 2020-09-08 NOTE — Progress Notes (Signed)
Virtual Visit via Video Note  I connected with Sierra [name] on 09/08/20 at  3:20 PM EDT by a video enabled telemedicine application and verified that I am speaking with the correct person using two identifiers.  Location Provider Location : [facility] Patient Location : Home  Participants: Patient , Provider   I discussed the limitations of evaluation and management by telemedicine and the availability of in person appointments. The patient expressed understanding and agreed to proceed.   I discussed the assessment and treatment plan with the patient. The patient was provided an opportunity to ask questions and all were answered. The patient agreed with the plan and demonstrated an understanding of the instructions.   The patient was advised to call back or seek an in-person evaluation if the symptoms worsen or if the condition fails to improve as anticipated.                                                         BH MD OP Progress Note  09/08/2020 5:31 PM Sierra [name]  MRN:  [id number]  Chief Complaint:  Chief Complaint   Follow-up; Anxiety; Depression    HPI: Sierra [name] is a 40 year old Caucasian female, married, lives in [place], has a history of bipolar disorder, GAD, binge eating disorder, ADHD, obesity, history of bariatric surgery was evaluated by telemedicine today.  Patient today reports she has been struggling with her concentration since the past few days.  She is currently on Strattera 25 mg which was recently increased.  She reports she understands that the Strattera takes time to give any benefit however she is currently struggling with her schoolwork.  Classes started for the fall semester on August 15.  Patient reports the last few days she had a lot of trouble keeping up with her assignments and projects.  She reports she has trouble focusing and completing tasks.  Patient reports situation at home has calmed right down.  Patient continues to be  compliant on her medications otherwise and reports mood symptoms as okay.  However her focus and attention problems does make her anxious.  She also reports sleep problems the past few nights.  It is likely because she has been taking a lower dosage of gabapentin since she was going to run out.  Patient denies any suicidality, homicidality or perceptual disturbances.  Patient denies any other concerns today.  Visit Diagnosis:    ICD-10-CM   1. Bipolar 1 disorder, depressed, moderate (HCC)  F31.32 gabapentin (NEURONTIN) 100 MG capsule    Lurasidone HCl (LATUDA) 60 MG TABS    2. GAD (generalized anxiety disorder)  F41.1 gabapentin (NEURONTIN) 100 MG capsule    gabapentin (NEURONTIN) 100 MG capsule    zaleplon (SONATA) 10 MG capsule    3. Attention deficit hyperactivity disorder (ADHD), predominantly inattentive type  F90.0 atomoxetine (STRATTERA) 40 MG capsule    4. Insomnia due to medical condition  G47.01 gabapentin (NEURONTIN) 100 MG capsule   mood, pain    5. Binge eating disorder  F50.81       Past Psychiatric History: Reviewed past psychiatric history from progress note on 06/20/2017  Past Medical History:  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  Anemia    Anxiety state, unspecified    Bipolar 1 disorder, depressed (HCC) 05/21/2017   BIPOLAR AFFECTIVE DISORDER 12/23/2006   Qualifier: Diagnosis of  By: Ermalene Searing MD, Amy     Bipolar affective disorder (HCC) 10/12/2011   Bipolar disorder, unspecified (HCC)    Depressive disorder, not elsewhere classified    Edema    Esophageal reflux    Fibromyalgia    Headache    History of kidney stones    Mild or unspecified pre-eclampsia, unspecified as to episode of care    Morbid obesity (HCC)    Myalgia and myositis, unspecified    Other dyspnea and respiratory abnormality    Preeclampsia    with first pregnancy   Raynaud's disease     Urinary tract infection, site not specified     Past Surgical History:  Procedure Laterality Date   CESAREAN SECTION     X 2   CHOLECYSTECTOMY  03/2006   CYSTOSCOPY W/ URETERAL STENT PLACEMENT Left 05/14/2016   Procedure: CYSTOSCOPY WITH STENT REPLACEMENT;  Surgeon: [name], MD;  Location: [facility];  Service: Urology;  Laterality: Left;   CYSTOSCOPY WITH STENT PLACEMENT Left 04/25/2016   Procedure: CYSTOSCOPY WITH STENT PLACEMENT;  Surgeon: [name], MD;  Location: [facility];  Service: Urology;  Laterality: Left;   DILATION AND CURETTAGE OF UTERUS     IVC FILTER INSERTION  03/26/2016   [facility]   LAPAROSCOPIC GASTRIC RESTRICTIVE DUODENAL PROCEDURE (DUODENAL SWITCH)  03/2016   LITHOTRIPSY     TONSILLECTOMY AND ADENOIDECTOMY     URETEROSCOPY WITH HOLMIUM LASER LITHOTRIPSY Left 05/14/2016   Procedure: URETEROSCOPY WITH HOLMIUM LASER LITHOTRIPSY;  Surgeon: [name], MD;  Location: [facility];  Service: Urology;  Laterality: Left;    Family Psychiatric History: Reviewed family psychiatric history from progress note on 06/20/2017  Family History:  Family History  Problem Relation Age of Onset   Depression Father    Alcohol abuse Father    Depression Mother    Hyperlipidemia Mother    Sleep apnea Mother    Depression Sister    Hyperlipidemia Brother    Depression Brother    Hyperlipidemia Brother    Depression Brother    Alcohol abuse Brother    Coronary artery disease Maternal Grandmother    Heart attack Maternal Grandmother    Diabetes Maternal Grandmother    Lung cancer Maternal Grandfather    Emphysema Maternal Grandfather    Coronary artery disease Maternal Grandfather    Lupus Other        Aunt   Fibromyalgia Other        Aunt    Social History: Reviewed social history from progress note on 06/20/2017 Social History   Socioeconomic History   Marital status: Married    Spouse name: brian   Number of children: 2   Years of education: Not on file    Highest education level: Some college, no degree  Occupational History   Occupation: Arts development officer  Tobacco Use   Smoking status: Never   Smokeless tobacco: Never  Vaping Use   Vaping Use: Never used  Substance and Sexual Activity   Alcohol use: Yes    Alcohol/week: 0.0 standard drinks    Comment: occassional   Drug use: No   Sexual activity: Yes    Partners: Male    Birth control/protection: None, I.U.D.  Other Topics Concern   Not on file  Social History Narrative   1 child, 2 step-sons  Regular exercise-no   Diet: no fast food, likes sweets   Social Determinants of Health   Financial Resource Strain: Not on file  Food Insecurity: Not on file  Transportation Needs: Not on file  Physical Activity: Not on file  Stress: Not on file  Social Connections: Not on file    Allergies:  Allergies  Allergen Reactions   Uncaria Tomentosa (Cats Claw) Other (See Comments)    allgeric to cats in general causes sneezing   Bee Pollen Itching   Molds & Smuts Other (See Comments)   Pollen Extract    Milk-Related Compounds     Able to tolerate yogurt; mild intolerance to milk/cheese   Pineapple Rash   Soy Allergy Diarrhea   Strawberry Extract Rash    Break out on tongue noted Break out on tongue noted Break out on tongue noted    Metabolic Disorder Labs: Lab Results  Component Value Date   HGBA1C 4.7 (L) 05/22/2017   MPG 88.19 05/22/2017   Lab Results  Component Value Date   PROLACTIN 7.6 11/07/2018   Lab Results  Component Value Date   CHOL 105 05/22/2017   TRIG 41 05/22/2017   HDL 48 05/22/2017   CHOLHDL 2.2 05/22/2017   VLDL 8 05/22/2017   LDLCALC 49 05/22/2017   LDLCALC 99 09/11/2013   Lab Results  Component Value Date   TSH 0.504 05/21/2018   TSH 0.677 05/22/2017    Therapeutic Level Labs: Lab Results  Component Value Date   LITHIUM 0.17 (L) 06/27/2020   LITHIUM 0.12 (L) 06/01/2020   No results found for: VALPROATE No components found for:   CBMZ  Current Medications: Current Outpatient Medications  Medication Sig Dispense Refill   atomoxetine (STRATTERA) 40 MG capsule Take 1 capsule (40 mg total) by mouth daily. 30 capsule 1   gabapentin (NEURONTIN) 100 MG capsule Take 4 capsules (400 mg total) by mouth at bedtime. 60 capsule 0   Biotin 5000 MCG TABS Take 1 tablet by mouth daily.     BLISOVI 24 FE 1-20 MG-MCG(24) tablet TAKE 1 TABLET BY MOUTH EVERY DAY 84 tablet 4   calcium citrate-vitamin D 500-400 MG-UNIT chewable tablet Chew 1 tablet by mouth daily.      cetirizine (ZYRTEC) 10 MG tablet Take 10 mg by mouth daily.     clonazePAM (KLONOPIN) 0.5 MG tablet Take 1 tablet (0.5 mg total) by mouth as directed. Take once every few days for severe panic attacks only. 10 tablet 0   cyclobenzaprine (FLEXERIL) 10 MG tablet Take 10 mg by mouth 2 (two) times daily as needed for muscle spasms.     docusate sodium (COLACE) 100 MG capsule Take 300 mg by mouth daily.     ferrous sulfate 325 (65 FE) MG tablet Take 325 mg by mouth daily with breakfast.     gabapentin (NEURONTIN) 100 MG capsule Take 4 capsules (400 mg total) by mouth at bedtime. 360 capsule 1   hydrOXYzine (ATARAX/VISTARIL) 25 MG tablet Take 1 tablet (25 mg total) by mouth daily as needed for anxiety. 90 tablet 1   lamoTRIgine (LAMICTAL) 150 MG tablet Take 1 tablet (150 mg total) by mouth daily. Start taking with 25 mg daily 90 tablet 0   lamoTRIgine (LAMICTAL) 25 MG tablet Take 1 tablet (25 mg total) by mouth daily. To be added to 150 mg daily 90 tablet 0   levonorgestrel (MIRENA) 20 MCG/24HR IUD 1 each by Intrauterine route once.     Lurasidone HCl (LATUDA) 60  MG TABS Take 1 tablet (60 mg total) by mouth daily with supper. 90 tablet 0   Milnacipran (SAVELLA) 50 MG TABS tablet Take 50 mg by mouth 2 (two) times daily. Takes differently     montelukast (SINGULAIR) 10 MG tablet Take 10 mg by mouth daily.     Multiple Vitamin (MULTIVITAMIN) tablet Take 1 tablet by mouth daily.      NIFEdipine (PROCARDIA-XL/ADALAT CC) 60 MG 24 hr tablet Take 60 mg by mouth daily.      omeprazole (PRILOSEC) 20 MG capsule Take 20 mg by mouth daily.     zaleplon (SONATA) 10 MG capsule Take 1 capsule (10 mg total) by mouth at bedtime as needed. 30 capsule 2   No current facility-administered medications for this visit.     Musculoskeletal: Strength & Muscle Tone:  UTA Gait & Station:  Seated Patient leans: N/A  Psychiatric Specialty Exam: Review of Systems  Psychiatric/Behavioral:  Positive for decreased concentration and sleep disturbance. The patient is nervous/anxious.   All other systems reviewed and are negative.  There were no vitals taken for this visit.There is no height or weight on file to calculate BMI.  General Appearance: Casual  Eye Contact:  Good  Speech:  Clear and Coherent  Volume:  Normal  Mood:  Anxious  Affect:  Congruent  Thought Process:  Goal Directed and Descriptions of Associations: Intact  Orientation:  Full (Time, Place, and Person)  Thought Content: Logical   Suicidal Thoughts:  No  Homicidal Thoughts:  No  Memory:  Immediate;   Fair Recent;   Fair Remote;   Fair  Judgement:  Fair  Insight:  Fair  Psychomotor Activity:  Normal  Concentration:  Concentration: Fair and Attention Span: Fair  Recall:  [employer]Fair  Fund of Knowledge: Fair  Language: Fair  Akathisia:  No  Handed:  Right  AIMS (if indicated): done  Assets:  Communication Skills Desire for Improvement Housing Talents/Skills Transportation Vocational/Educational  ADL's:  Intact  Cognition: WNL  Sleep:  Poor   Screenings: AIMS    Flowsheet Row Video Visit from 08[facility]04/2020 [facility] Video Visit from [facility]01[facility]2020 [facility] Office [facility]/11[facility] from [facility]29[facility]2017 [facility] Office Visit from [facility]20/2017 [facility]  AIMS Total Score 0 8 1 7 5       AUDIT    Flowsheet Row [facility]Dis[facility]  Alcohol Use Disorder Identification Test Final Score (AUDIT) 6      PHQ2-9    Flowsheet Row [facility]2020 [facility] Office Visit from 08/10/2020 in [facility] Video Visit from 08/04/2020 [facility] Video Visit from 05/25/2020 in [facility] Video Visit from 03/01/2020 [facility]  PHQ-2 Total Score 2 5 6 3 4   PHQ-9 Total Score 6 17 20 5 14       Flowsheet Row [facility] Office Visit from [facility]2020 [facility] Video Visit from 08[facility]04/2020 [facility]  C-SSRS RISK CATEGORY Error: Q3, 4, or 5 should not be populated when Q2 is No Error: Q3, 4, or 5 should not be populated when Q2 is No Error: Q3, 4, or 5 should not be populated when Q2 is No        Assessment and  Plan: SHALISHA CLAUSING is a 40 year old Caucasian female who has a history of bipolar disorder, anxiety disorder, binge eating disorder was evaluated by telemedicine today.  Patient reports current concentration problem, currently on a low dose of Strattera.  Patient with history of tachycardia was referred for cardiology, still pending.  Discussed plan as noted below.  Plan Bipolar disorder type I depressed-in partial remission Latuda 60 mg p.o. daily with meals Lamictal 175 mg p.o. daily Gabapentin 400 mg p.o. nightly, encouraged compliance.  Sleep problems likely due to not being on the right dosage of gabapentin. Sonata 10 mg p.o. nightly as needed  GAD-improving Gabapentin as prescribed, encouraged compliance. Hydroxyzine 25 mg p.o. daily as needed for severe anxiety  attacks Klonopin 0.5 mg as needed for severe anxiety attacks only.  She rarely uses it.  ADHD-unstable Increase Strattera to 40 mg p.o. daily Patient does have upcoming cardiology visit-will coordinate care.  Patient was referred to cardiology last visit.  Binge eating disorder-stable We will monitor closely  Tachycardia-refer to cardiology.  Continue psychotherapy sessions with her therapist-Ms. Tiffany at [facility].  Follow-up in clinic in 2 weeks or sooner if needed.  This note was generated in part or whole with voice recognition software. Voice recognition is usually quite accurate but there are transcription errors that can and very often do occur. I apologize for any typographical errors that were not detected and corrected.     Jomarie Longs, MD 09/08/2020, 5:31 PM

## 2020-09-22 ENCOUNTER — Encounter: Payer: Self-pay | Admitting: Psychiatry

## 2020-09-23 ENCOUNTER — Encounter: Payer: Self-pay | Admitting: Psychiatry

## 2020-09-23 ENCOUNTER — Other Ambulatory Visit: Payer: Self-pay

## 2020-09-23 ENCOUNTER — Ambulatory Visit (INDEPENDENT_AMBULATORY_CARE_PROVIDER_SITE_OTHER): Admitting: Psychiatry

## 2020-09-23 VITALS — BP 123/78 | HR 98 | Wt 338.2 lb

## 2020-09-23 DIAGNOSIS — G4701 Insomnia due to medical condition: Secondary | ICD-10-CM

## 2020-09-23 DIAGNOSIS — F900 Attention-deficit hyperactivity disorder, predominantly inattentive type: Secondary | ICD-10-CM

## 2020-09-23 DIAGNOSIS — F3132 Bipolar disorder, current episode depressed, moderate: Secondary | ICD-10-CM | POA: Diagnosis not present

## 2020-09-23 DIAGNOSIS — F411 Generalized anxiety disorder: Secondary | ICD-10-CM

## 2020-09-23 DIAGNOSIS — F5081 Binge eating disorder: Secondary | ICD-10-CM

## 2020-09-23 MED ORDER — CLONAZEPAM 0.5 MG PO TABS: 0.5000 mg | ORAL_TABLET | ORAL | 1 refills | Status: DC

## 2020-09-23 NOTE — Progress Notes (Signed)
BH MD/PA/NP OP Progress Note  09/23/2020 11:08 AM Sierra [name]  MRN:  [id number]  Chief Complaint:  Chief Complaint   Follow-up; Depression; ADD; Anxiety    HPI: Sierra [name] is a 40 year old Caucasian female, married, lives in [place], has a history of bipolar disorder, GAD, binge eating disorder, ADHD, obesity, history of bariatric surgery was evaluated in office today.  Patient reports he is currently tolerating the Strattera well.  She is currently taking 40 mg.  She reports recently she had to sit down to do assignments, read 90 pages of information before she took her test.  She reports she had to take the tests several times before she could do it right.  She eventually completed it however her grades were low.  Patient reports throughout the time that she was working on her assignments she became very anxious, was restless, stayed up the whole night to complete her assignment.  It affected her sleep.  The next day she could not concentrate because she could not get any sleep.  She is worried about her school, completing her assignments and projects.  That does have an impact on her mood.  Patient reports appetite is fair.  Patient otherwise reports she is currently not having any suicidal thoughts .  Patient has upcoming appointment with cardiology coming up in December  Patient would like writer to complete accommodation request with [facility].  Some time was spent completing the form, reviewing information with patient during the session.    Visit Diagnosis:    ICD-10-CM   1. Bipolar 1 disorder, depressed, moderate (HCC)  F31.32     2. GAD (generalized anxiety disorder)  F41.1 clonazePAM (KLONOPIN) 0.5 MG tablet    3. Attention deficit hyperactivity disorder (ADHD), predominantly inattentive type  F90.0     4. Insomnia due to medical condition  G47.01    mood ,pain    5. Binge eating disorder  F50.81       Past Psychiatric History: I have reviewed past  psychiatric history from progress note on 06/20/2017  Past Medical History:  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   Anemia    Anxiety state, unspecified    Bipolar 1 disorder, depressed (HCC) 05/21/2017   BIPOLAR AFFECTIVE DISORDER 12/23/2006   Qualifier: Diagnosis of  By: Ermalene Searing MD, Amy     Bipolar affective disorder (HCC) 10/12/2011   Bipolar disorder, unspecified (HCC)    Depressive disorder, not elsewhere classified    Edema    Esophageal reflux    Fibromyalgia    Headache    History of kidney stones    Mild or unspecified pre-eclampsia, unspecified as to episode of care    Morbid obesity (HCC)    Myalgia and myositis, unspecified    Other dyspnea and respiratory abnormality    Preeclampsia    with first pregnancy   Raynaud's disease    Urinary tract infection, site not specified     Past Surgical History:  Procedure Laterality Date   CESAREAN SECTION     X 2   CHOLECYSTECTOMY  03/2006   CYSTOSCOPY W/ URETERAL STENT PLACEMENT Left 05/14/2016   Procedure: CYSTOSCOPY WITH STENT REPLACEMENT;  Surgeon: [name], MD;  Location: [facility];  Service: Urology;  Laterality: Left;   CYSTOSCOPY WITH STENT PLACEMENT Left 04/25/2016   Procedure: CYSTOSCOPY WITH STENT PLACEMENT;  Surgeon: [name], MD;  Location: [facility];  Service: Urology;  Laterality: Left;   DILATION AND CURETTAGE OF UTERUS     IVC FILTER INSERTION  03/26/2016   [facility]   LAPAROSCOPIC GASTRIC RESTRICTIVE DUODENAL PROCEDURE (DUODENAL SWITCH)  03/2016   LITHOTRIPSY     TONSILLECTOMY AND ADENOIDECTOMY     URETEROSCOPY WITH HOLMIUM LASER LITHOTRIPSY Left 05/14/2016   Procedure: URETEROSCOPY WITH HOLMIUM LASER LITHOTRIPSY;  Surgeon: [name], MD;  Location: [facility];  Service: Urology;  Laterality: Left;    Family Psychiatric History: Reviewed family psychiatric history from progress note on  06/20/2017  Family History:  Family History  Problem Relation Age of Onset   Depression Father    Alcohol abuse Father    Depression Mother    Hyperlipidemia Mother    Sleep apnea Mother    Depression Sister    Hyperlipidemia Brother    Depression Brother    Hyperlipidemia Brother    Depression Brother    Alcohol abuse Brother    Coronary artery disease Maternal Grandmother    Heart attack Maternal Grandmother    Diabetes Maternal Grandmother    Lung cancer Maternal Grandfather    Emphysema Maternal Grandfather    Coronary artery disease Maternal Grandfather    Lupus Other        Aunt   Fibromyalgia Other        Aunt    Social History: Reviewed social history from progress note on 06/20/2017 Social History   Socioeconomic History   Marital status: Married    Spouse name: brian   Number of children: 2   Years of education: Not on file   Highest education level: Some college, no degree  Occupational History   Occupation: Arts development officer  Tobacco Use   Smoking status: Never   Smokeless tobacco: Never  Vaping Use   Vaping Use: Never used  Substance and Sexual Activity   Alcohol use: Yes    Alcohol/week: 0.0 standard drinks    Comment: occassional   Drug use: No   Sexual activity: Yes    Partners: Male    Birth control/protection: None, I.U.D.  Other Topics Concern   Not on file  Social History Narrative   1 child, 2 step-sons      Regular exercise-no   Diet: no fast food, likes sweets   Social Determinants of Health   Financial Resource Strain: Not on file  Food Insecurity: Not on file  Transportation Needs: Not on file  Physical Activity: Not on file  Stress: Not on file  Social Connections: Not on file    Allergies:  Allergies  Allergen Reactions   Uncaria Tomentosa (Cats Claw) Other (See Comments)    allgeric to cats in general causes sneezing   Bee Pollen Itching   Molds & Smuts Other (See Comments)   Pollen Extract    Milk-Related Compounds      Able to tolerate yogurt; mild intolerance to milk/cheese   Pineapple Rash   Soy Allergy Diarrhea   Strawberry Extract Rash    Break out on tongue noted Break out on tongue noted Break out on tongue noted    Metabolic Disorder Labs: Lab Results  Component Value Date   HGBA1C 4.7 (L) 05/22/2017   MPG 88.19 05/22/2017   Lab Results  Component Value Date   PROLACTIN 7.6 11/07/2018   Lab Results  Component Value Date   CHOL 105 05/22/2017   TRIG 41 05/22/2017   HDL 48 05/22/2017   CHOLHDL 2.2 05/22/2017  VLDL 8 05/22/2017   LDLCALC 49 05/22/2017   LDLCALC 99 09/11/2013   Lab Results  Component Value Date   TSH 0.504 05/21/2018   TSH 0.677 05/22/2017    Therapeutic Level Labs: Lab Results  Component Value Date   LITHIUM 0.17 (L) 06/27/2020   LITHIUM 0.12 (L) 06/01/2020   No results found for: VALPROATE No components found for:  CBMZ  Current Medications: Current Outpatient Medications  Medication Sig Dispense Refill   atomoxetine (STRATTERA) 40 MG capsule Take 1 capsule (40 mg total) by mouth daily. 30 capsule 1   Biotin 5000 MCG TABS Take 1 tablet by mouth daily.     BLISOVI 24 FE 1-20 MG-MCG(24) tablet TAKE 1 TABLET BY MOUTH EVERY DAY 84 tablet 4   calcium citrate-vitamin D 500-400 MG-UNIT chewable tablet Chew 1 tablet by mouth daily.      cetirizine (ZYRTEC) 10 MG tablet Take 10 mg by mouth daily.     clonazePAM (KLONOPIN) 0.5 MG tablet Take 1 tablet (0.5 mg total) by mouth as directed. Take once every few days for severe panic attacks only. 10 tablet 1   cyclobenzaprine (FLEXERIL) 10 MG tablet Take 10 mg by mouth 2 (two) times daily as needed for muscle spasms.     docusate sodium (COLACE) 100 MG capsule Take 300 mg by mouth daily.     ferrous sulfate 325 (65 FE) MG tablet Take 325 mg by mouth daily with breakfast.     gabapentin (NEURONTIN) 100 MG capsule Take 4 capsules (400 mg total) by mouth at bedtime. 360 capsule 1   gabapentin (NEURONTIN) 100 MG capsule  Take 4 capsules (400 mg total) by mouth at bedtime. 60 capsule 0   hydrOXYzine (ATARAX/VISTARIL) 25 MG tablet Take 1 tablet (25 mg total) by mouth daily as needed for anxiety. 90 tablet 1   lamoTRIgine (LAMICTAL) 150 MG tablet Take 1 tablet (150 mg total) by mouth daily. Start taking with 25 mg daily 90 tablet 0   lamoTRIgine (LAMICTAL) 25 MG tablet Take 1 tablet (25 mg total) by mouth daily. To be added to 150 mg daily 90 tablet 0   levonorgestrel (MIRENA) 20 MCG/24HR IUD 1 each by Intrauterine route once.     Lurasidone HCl (LATUDA) 60 MG TABS Take 1 tablet (60 mg total) by mouth daily with supper. 90 tablet 0   Milnacipran (SAVELLA) 50 MG TABS tablet Take 50 mg by mouth 2 (two) times daily. Takes differently     montelukast (SINGULAIR) 10 MG tablet Take 10 mg by mouth daily.     Multiple Vitamin (MULTIVITAMIN) tablet Take 1 tablet by mouth daily.     NIFEdipine (PROCARDIA-XL/ADALAT CC) 60 MG 24 hr tablet Take 60 mg by mouth daily.      omeprazole (PRILOSEC) 20 MG capsule Take 20 mg by mouth daily.     zaleplon (SONATA) 10 MG capsule Take 1 capsule (10 mg total) by mouth at bedtime as needed. 30 capsule 2   No current facility-administered medications for this visit.     Musculoskeletal: Strength & Muscle Tone: within normal limits Gait & Station: normal Patient leans: N/A  Psychiatric Specialty Exam: Review of Systems  Psychiatric/Behavioral:  Positive for decreased concentration and sleep disturbance. The patient is nervous/anxious.   All other systems reviewed and are negative.  Blood pressure 123/78, pulse 98, weight (!) 338 lb 3.2 oz (153.4 kg).Body mass index is 59.55 kg/m.  General Appearance: Casual  Eye Contact:  Good  Speech:  Clear and  Coherent  Volume:  Normal  Mood:  Anxious  Affect:  Congruent  Thought Process:  Goal Directed and Descriptions of Associations: Intact  Orientation:  Full (Time, Place, and Person)  Thought Content: Logical   Suicidal Thoughts:  No   Homicidal Thoughts:  No  Memory:  Immediate;   Fair Recent;   Fair Remote;   Fair  Judgement:  Fair  Insight:  Fair  Psychomotor Activity:  Normal  Concentration:  Concentration: Good and Attention Span: Good  Recall:  Fair  Fund of Knowledge: Fair  Language: Fair  Akathisia:  No  Handed:  Right  AIMS (if indicated): done  Assets:  Communication Skills Desire for Improvement Housing Social Support Talents/Skills Transportation Vocational/Educational  ADL's:  Intact  Cognition: WNL  Sleep:  Poor   Screenings: AIMS    Flowsheet Row Video Visit from 08/04/2020 in [facility] Video Visit from 03/01/2020 in [facility] Office Visit from 12/11/2017 in [facility] Office Visit from 11/29/2017 in [facility] Office Visit from 11/20/2017 in [facility]  AIMS Total Score 0 8 1 7 5       AUDIT    Flowsheet Row Admission (Discharged) from 05/21/2017 in [facility]  Alcohol Use Disorder Identification Test Final Score (AUDIT) 6      PHQ2-9    Flowsheet Row Office Visit from 08/23/2020 in [facility] Office Visit from 08/10/2020 in [facility] Video Visit from 08/04/2020 in [facility] Video Visit from 05/25/2020 in [facility] Video Visit from 03/01/2020 in [facility]  PHQ-2 Total Score 2 5 6 3 4   PHQ-9 Total Score 6 17 20 5 14       Flowsheet Row Office Visit from 08/23/2020 in [facility] Office Visit from 08/10/2020 in [facility] Video Visit from 08/04/2020 in [facility]  C-SSRS RISK CATEGORY Error: Q3, 4, or 5 should not be populated when Q2 is No Error: Q3, 4, or 5 should not be populated when  Q2 is No Error: Q3, 4, or 5 should not be populated when Q2 is No        Assessment and Plan: MIN TUNNELL is a 40 year old Caucasian female who has a history of bipolar disorder, anxiety disorder, binge eating disorder was evaluated in office today.  Patient continues to struggle with concentration problems currently tolerating the Strattera.  She will benefit from the following plan.  Plan Bipolar disorder type I depressed-in partial remission Latuda 60 mg p.o. daily with meals Lamictal 175 mg p.o. daily Gabapentin 400 mg p.o. nightly, encouraged compliance. Sonata 10 mg p.o. nightly as needed  GAD-improving Gabapentin as prescribed, encourage compliance Hydroxyzine 25 mg p.o. daily as needed for severe anxiety attacks Klonopin 0.5 mg as needed for severe anxiety attacks only-provided a new prescription.  Patient advised to limit use. Continue CBT with Ms. Tiffany  ADHD-unstable Strattera 40 mg p.o. daily-recently increased. Pending cardiology visit for tachycardia-we will coordinate care.  Binge eating disorder-stable We will monitor closely  Some time was spent completing Accommodation form for [facility] requesting more time during exams as well as assignments as needed.  A letter was also printed out.  This was discussed at length with patient.  Patient provided formal written consent to provide this information to [facility]  This will be faxed to them today.  Follow-up in clinic in person in 3  to 4 weeks.  This note was generated in part or whole with voice recognition software. Voice recognition is usually quite accurate but there are transcription errors that can and very often do occur. I apologize for any typographical errors that were not detected and corrected.     Jomarie Longs, MD 09/23/2020, 11:08 AM

## 2020-09-28 ENCOUNTER — Ambulatory Visit (INDEPENDENT_AMBULATORY_CARE_PROVIDER_SITE_OTHER): Admitting: Obstetrics & Gynecology

## 2020-09-28 ENCOUNTER — Other Ambulatory Visit (HOSPITAL_COMMUNITY)
Admission: RE | Admit: 2020-09-28 | Discharge: 2020-09-28 | Disposition: A | Discharge status: Home / Self Care | Source: Ambulatory Visit | Attending: Obstetrics & Gynecology | Admitting: Obstetrics & Gynecology

## 2020-09-28 ENCOUNTER — Other Ambulatory Visit: Payer: Self-pay

## 2020-09-28 ENCOUNTER — Encounter: Payer: Self-pay | Admitting: Obstetrics & Gynecology

## 2020-09-28 VITALS — BP 122/64 | HR 66 | Resp 16

## 2020-09-28 DIAGNOSIS — R87810 Cervical high risk human papillomavirus (HPV) DNA test positive: Secondary | ICD-10-CM | POA: Insufficient documentation

## 2020-09-28 DIAGNOSIS — B372 Candidiasis of skin and nail: Secondary | ICD-10-CM | POA: Diagnosis not present

## 2020-09-28 DIAGNOSIS — N870 Mild cervical dysplasia: Secondary | ICD-10-CM

## 2020-09-28 DIAGNOSIS — R87612 Low grade squamous intraepithelial lesion on cytologic smear of cervix (LGSIL): Secondary | ICD-10-CM | POA: Insufficient documentation

## 2020-09-28 MED ORDER — NYSTATIN-TRIAMCINOLONE 100000-0.1 UNIT/GM-% EX OINT: 1.0000 "application " | TOPICAL_OINTMENT | Freq: Every day | CUTANEOUS | 3 refills | Status: DC

## 2020-09-28 NOTE — Progress Notes (Signed)
    Sierra [name] [birth year] [id number]        40 y.o.  [id number]   RP: LGSIL/HPV HR pos 02/2020 for repeat Pap test  HPI: H/O CIN 1 in 2020.  Last Pap 02/2020 showed LGSIl with HR HPV Pos.  H/O HPV 16 pos.  Recommended a Colposcopy, but patient declined.  Accepted to repeat a Pap test today.  C/O skin rash with itchiness under the abdominal panniculus.   OB History  Gravida Para Term Preterm AB Living  5 2     3 2   SAB IAB Ectopic Multiple Live Births  2 1          # Outcome Date GA Lbr Len/2nd Weight Sex Delivery Anes PTL Lv  5 IAB           4 SAB           3 SAB           2 Para           1 Para             Past medical history,surgical history, problem list, medications, allergies, family history and social history were all reviewed and documented in the EPIC chart.   Directed ROS with pertinent positives and negatives documented in the history of present illness/assessment and plan.  Exam:  Vitals:   09/28/20 1129  BP: 122/64  Pulse: 66  Resp: 16   General appearance:  Normal  Abdomen: Rash under the abdominal panniculus.  Gynecologic exam: Vulva normal.  Speculum:  Cervix/Vagina normal.  Pap reflex done.   Assessment/Plan:  40 y.o. 30   1. LGSIL on Pap smear of cervix Last Pap LGSIL with HR HPV pos and h/o HPV 16 pos.  Recommended a Colposcopy which patient declined.  Pap test reflex done today. - Cytology - PAP( [place])  2. Cervical high risk HPV (human papillomavirus) test positive HR HPV pos 02/2020.  Prior h/o HPV 16 pos. - Cytology - PAP( [place])  3. Dysplasia of cervix, low grade (CIN 1)  CIN 1 on Colpo in 2020. - Cytology - PAP( [place])  4. Yeast infection of the skin Skin rash with itchiness under the abdominal panniculus.  Probably yeast infection of the skin.  Will treat with Mycolog ointment daily as needed.  Prescription sent to pharmacy.  Other orders - nystatin-triamcinolone ointment (MYCOLOG); Apply 1 application  topically daily.   2021 MD, 11:54 AM 09/28/2020

## 2020-10-01 ENCOUNTER — Other Ambulatory Visit: Payer: Self-pay | Admitting: Psychiatry

## 2020-10-01 DIAGNOSIS — F900 Attention-deficit hyperactivity disorder, predominantly inattentive type: Secondary | ICD-10-CM

## 2020-10-03 LAB — CYTOLOGY - PAP: Diagnosis: NEGATIVE

## 2020-10-04 ENCOUNTER — Ambulatory Visit: Payer: Self-pay | Admitting: Obstetrics & Gynecology

## 2020-10-10 ENCOUNTER — Other Ambulatory Visit: Payer: Self-pay

## 2020-10-10 ENCOUNTER — Ambulatory Visit (INDEPENDENT_AMBULATORY_CARE_PROVIDER_SITE_OTHER): Admitting: Cardiology

## 2020-10-10 ENCOUNTER — Encounter: Payer: Self-pay | Admitting: Cardiology

## 2020-10-10 VITALS — BP 110/72 | HR 78 | Ht 64.0 in | Wt 339.0 lb

## 2020-10-10 DIAGNOSIS — R0609 Other forms of dyspnea: Secondary | ICD-10-CM

## 2020-10-10 DIAGNOSIS — R000 Tachycardia, unspecified: Secondary | ICD-10-CM

## 2020-10-10 DIAGNOSIS — R0600 Dyspnea, unspecified: Secondary | ICD-10-CM

## 2020-10-10 NOTE — Patient Instructions (Signed)
Medication Instructions:  Your physician recommends that you continue on your current medications as directed. Please refer to the Current Medication list given to you today.  *If you need a refill on your cardiac medications before your next appointment, please call your pharmacy*   Lab Work: None ordered If you have labs (blood work) drawn today and your tests are completely normal, you will receive your results only by: . MyChart Message (if you have MyChart) OR . A paper copy in the mail If you have any lab test that is abnormal or we need to change your treatment, we will call you to review the results.   Testing/Procedures:  Your physician has requested that you have an echocardiogram. Echocardiography is a painless test that uses sound waves to create images of your heart. It provides your doctor with information about the size and shape of your heart and how well your heart's chambers and valves are working. This procedure takes approximately one hour. There are no restrictions for this procedure.    Follow-Up: At [facility], you and your health needs are our priority.  As part of our continuing mission to provide you with exceptional heart care, we have created designated Provider Care Teams.  These Care Teams include your primary Cardiologist (physician) and Advanced Practice Providers (APPs -  Physician Assistants and Nurse Practitioners) who all work together to provide you with the care you need, when you need it.  We recommend signing up for the patient portal called "MyChart".  Sign up information is provided on this After Visit Summary.  MyChart is used to connect with patients for Virtual Visits (Telemedicine).  Patients are able to view lab/test results, encounter notes, upcoming appointments, etc.  Non-urgent messages can be sent to your provider as well.   To learn more about what you can do with MyChart, go to [email or web address].    Your next appointment:    Follow up after Echo   The format for your next appointment:   In Person  Provider:   [name] Agbor-Etang, MD   Other Instructions   

## 2020-10-10 NOTE — Progress Notes (Signed)
Cardiology Office Note:    Date:  10/10/2020   ID:  [name], DOB 1980/02/14, MRN [id number]  PCP:  Armando Gang, FNP   [facility] Providers Cardiologist:  None     Referring MD: Jomarie Longs, MD   Chief Complaint  Patient presents with   New Patient (Initial Visit)    Referred for Tachycardia. Meds reviewed verbally with patient.    Sierra [name] is a 40 y.o. female who is being seen today for the evaluation of tachycardia at the request of Jomarie Longs, MD.   History of Present Illness:    Sierra [name] is a 40 y.o. female with a hx of ADHD, anxiety, Raynaud's disease who presents due to elevated heart rates.  Patient has a history of ADHD, previously was on methylphenidate, Vyvanse.  Elevated heart rates were noted by her psychiatrist and she was taken off of these medicines.  She was advised to follow-up with cardiology, and make sure her heart function is okay prior to resumption.  She denies chest pain, shortness of breath with activities of daily living.  States being short of breath with overexertion.  Denies smoking, denies any history of heart disease.  She takes nifedipine due to Raynaud's disease.  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   Anemia    Anxiety state, unspecified    Bipolar 1 disorder, depressed (HCC) 05/21/2017   BIPOLAR AFFECTIVE DISORDER 12/23/2006   Qualifier: Diagnosis of  By: Ermalene Searing MD, Amy     Bipolar affective disorder (HCC) 10/12/2011   Bipolar disorder, unspecified (HCC)    Depressive disorder, not elsewhere classified    Edema    Esophageal reflux    Fibromyalgia    Headache    History of kidney stones    Mild or unspecified pre-eclampsia, unspecified as to episode of care    Morbid obesity (HCC)    Myalgia and myositis, unspecified    Other dyspnea and respiratory abnormality    Preeclampsia    with first pregnancy    Raynaud's disease    Urinary tract infection, site not specified     Past Surgical History:  Procedure Laterality Date   CESAREAN SECTION     X 2   CHOLECYSTECTOMY  03/2006   CYSTOSCOPY W/ URETERAL STENT PLACEMENT Left 05/14/2016   Procedure: CYSTOSCOPY WITH STENT REPLACEMENT;  Surgeon: [name], MD;  Location: [facility];  Service: Urology;  Laterality: Left;   CYSTOSCOPY WITH STENT PLACEMENT Left 04/25/2016   Procedure: CYSTOSCOPY WITH STENT PLACEMENT;  Surgeon: [name], MD;  Location: [facility];  Service: Urology;  Laterality: Left;   DILATION AND CURETTAGE OF UTERUS     INTRAUTERINE DEVICE (IUD) INSERTION     paragard removed & mirena inserted   IVC FILTER INSERTION  03/26/2016   [facility]   LAPAROSCOPIC GASTRIC RESTRICTIVE DUODENAL PROCEDURE (DUODENAL SWITCH)  03/2016   LITHOTRIPSY     TONSILLECTOMY AND ADENOIDECTOMY     URETEROSCOPY WITH HOLMIUM LASER LITHOTRIPSY Left 05/14/2016   Procedure: URETEROSCOPY WITH HOLMIUM LASER LITHOTRIPSY;  Surgeon: [name], MD;  Location: [facility];  Service: Urology;  Laterality: Left;    Current Medications: Current Meds  Medication Sig   atomoxetine (STRATTERA) 40 MG capsule TAKE 1 CAPSULE (40 MG TOTAL) BY MOUTH DAILY.   Biotin 5000 MCG TABS Take 1 tablet by mouth daily.   BLISOVI 24 FE  1-20 MG-MCG(24) tablet TAKE 1 TABLET BY MOUTH EVERY DAY   calcium citrate-vitamin D 500-400 MG-UNIT chewable tablet Chew 1 tablet by mouth daily.    cetirizine (ZYRTEC) 10 MG tablet Take 10 mg by mouth daily.   clonazePAM (KLONOPIN) 0.5 MG tablet Take 1 tablet (0.5 mg total) by mouth as directed. Take once every few days for severe panic attacks only.   cyclobenzaprine (FLEXERIL) 10 MG tablet Take 10 mg by mouth 2 (two) times daily as needed for muscle spasms.   docusate sodium (COLACE) 100 MG capsule Take 300 mg by mouth daily.   ferrous sulfate 325 (65 FE) MG tablet Take 325 mg by mouth daily with breakfast.   gabapentin (NEURONTIN)  100 MG capsule Take 4 capsules (400 mg total) by mouth at bedtime.   hydrOXYzine (ATARAX/VISTARIL) 25 MG tablet Take 1 tablet (25 mg total) by mouth daily as needed for anxiety.   lamoTRIgine (LAMICTAL) 150 MG tablet Take 1 tablet (150 mg total) by mouth daily. Start taking with 25 mg daily   lamoTRIgine (LAMICTAL) 25 MG tablet Take 1 tablet (25 mg total) by mouth daily. To be added to 150 mg daily   levonorgestrel (MIRENA) 20 MCG/24HR IUD 1 each by Intrauterine route once.   Lurasidone HCl (LATUDA) 60 MG TABS Take 1 tablet (60 mg total) by mouth daily with supper.   Milnacipran (SAVELLA) 50 MG TABS tablet Take 50 mg by mouth 2 (two) times daily. Takes differently   montelukast (SINGULAIR) 10 MG tablet Take 10 mg by mouth daily.   Multiple Vitamin (MULTIVITAMIN) tablet Take 1 tablet by mouth daily.   NIFEdipine (PROCARDIA-XL/ADALAT CC) 60 MG 24 hr tablet Take 60 mg by mouth daily.    nystatin-triamcinolone ointment (MYCOLOG) Apply 1 application topically daily.   omeprazole (PRILOSEC) 20 MG capsule Take 20 mg by mouth daily.   zaleplon (SONATA) 10 MG capsule Take 1 capsule (10 mg total) by mouth at bedtime as needed.     Allergies:   Uncaria tomentosa (cats claw), Bee pollen, Molds & smuts, Pollen extract, Milk-related compounds, Pineapple, Soy allergy, and Strawberry extract   Social History   Socioeconomic History   Marital status: Married    Spouse name: Luiza Carranco   Number of children: 2   Years of education: Not on file   Highest education level: Some college, no degree  Occupational History   Occupation: Arts development officer  Tobacco Use   Smoking status: Never   Smokeless tobacco: Never  Vaping Use   Vaping Use: Never used  Substance and Sexual Activity   Alcohol use: Yes    Alcohol/week: 0.0 standard drinks    Comment: occassional   Drug use: No   Sexual activity: Yes    Partners: Male    Birth control/protection: I.U.D., OCP    Comment: mirena inserted 09-01-18  Other Topics Concern    Not on file  Social History Narrative   1 child, 2 step-sons      Regular exercise-no   Diet: no fast food, likes sweets   Social Determinants of Health   Financial Resource Strain: Not on file  Food Insecurity: Not on file  Transportation Needs: Not on file  Physical Activity: Not on file  Stress: Not on file  Social Connections: Not on file     Family History: The patient's family history includes Alcohol abuse in her brother and father; Coronary artery disease in her maternal grandfather and maternal grandmother; Depression in her brother, brother, father, mother, and sister;  Diabetes in her maternal grandmother; Emphysema in her maternal grandfather; Fibromyalgia in an other family member; Heart attack in her maternal grandmother; Hyperlipidemia in her brother, brother, and mother; Lung cancer in her maternal grandfather; Lupus in an other family member; Sleep apnea in her mother.  ROS:   Please see the history of present illness.     All other systems reviewed and are negative.  EKGs/Labs/Other Studies Reviewed:    The following studies were reviewed today:   EKG:  EKG is  ordered today.  The ekg ordered today demonstrates normal sinus rhythm, possible old septal infarct  Recent Labs: 11/03/2019: Hemoglobin 13.8; Platelets 266 08/05/2020: BUN 12; Creatinine, Ser 0.91  Recent Lipid Panel    Component Value Date/Time   CHOL 105 05/22/2017 0643   TRIG 41 05/22/2017 0643   HDL 48 05/22/2017 0643   CHOLHDL 2.2 05/22/2017 0643   VLDL 8 05/22/2017 0643   LDLCALC 49 05/22/2017 0643     Risk Assessment/Calculations:          Physical Exam:    VS:  BP 110/72 (BP Location: Left Wrist, Patient Position: Sitting, Cuff Size: Normal)   Pulse 78   Ht 5\' 4"  (1.626 m)   Wt (!) 339 lb (153.8 kg)   SpO2 97%   BMI 58.19 kg/m     Wt Readings from Last 3 Encounters:  10/10/20 (!) 339 lb (153.8 kg)  09/23/20 (!) 338 lb 3.2 oz (153.4 kg)  08/23/20 (!) 339 lb 12.8 oz  (154.1 kg)     GEN:  Well nourished, well developed in no acute distress HEENT: Normal NECK: No JVD; No carotid bruits LYMPHATICS: No lymphadenopathy CARDIAC: RRR, no murmurs, rubs, gallops RESPIRATORY:  Clear to auscultation without rales, wheezing or rhonchi  ABDOMEN: Soft, non-tender, non-distended MUSCULOSKELETAL:  No edema; No deformity  SKIN: Warm and dry NEUROLOGIC:  Alert and oriented x 3 PSYCHIATRIC:  Normal affect   ASSESSMENT:    1. DOE (dyspnea on exertion)   2. Tachycardia   3. Morbid obesity (HCC)    PLAN:    In order of problems listed above:  Dyspnea on exertion, likely from deconditioning/morbid obesity.  Get echocardiogram to evaluate any gross structural abnormalities. Tachycardia secondary to medication/stimulants side effects.  Heart rate normal today.  Will review echo as above.  If no gross structural abnormalities are noted on echo, okay from a cardiac perspective for her psych team to give stimulants.  If heart rates are noted to be elevated, beta-blocker/Toprol-XL can be considered. Morbid obesity, weight loss recommended.  Follow-up after echocardiogram.     Medication Adjustments/Labs and Tests Ordered: Current medicines are reviewed at length with the patient today.  Concerns regarding medicines are outlined above.  Orders Placed This Encounter  Procedures   EKG 12-Lead   ECHOCARDIOGRAM COMPLETE   No orders of the defined types were placed in this encounter.   Patient Instructions  Medication Instructions:  Your physician recommends that you continue on your current medications as directed. Please refer to the Current Medication list given to you today.  *If you need a refill on your cardiac medications before your next appointment, please call your pharmacy*   Lab Work: None ordered If you have labs (blood work) drawn today and your tests are completely normal, you will receive your results only by: MyChart Message (if you have  MyChart) OR A paper copy in the mail If you have any lab test that is abnormal or we need to change your  treatment, we will call you to review the results.   Testing/Procedures:   Your physician has requested that you have an echocardiogram. Echocardiography is a painless test that uses sound waves to create images of your heart. It provides your doctor with information about the size and shape of your heart and how well your heart's chambers and valves are working. This procedure takes approximately one hour. There are no restrictions for this procedure.    Follow-Up: At [facility], you and your health needs are our priority.  As part of our continuing mission to provide you with exceptional heart care, we have created designated Provider Care Teams.  These Care Teams include your primary Cardiologist (physician) and Advanced Practice Providers (APPs -  Physician Assistants and Nurse Practitioners) who all work together to provide you with the care you need, when you need it.  We recommend signing up for the patient portal called "MyChart".  Sign up information is provided on this After Visit Summary.  MyChart is used to connect with patients for Virtual Visits (Telemedicine).  Patients are able to view lab/test results, encounter notes, upcoming appointments, etc.  Non-urgent messages can be sent to your provider as well.   To learn more about what you can do with MyChart, go to [email or web address].    Your next appointment:   Follow up after Echo   The format for your next appointment:   In Person  Provider:   Debbe Odea, MD   Other Instructions    Signed, Debbe Odea, MD  10/10/2020 12:41 PM     [facility]

## 2020-10-14 ENCOUNTER — Ambulatory Visit: Admitting: Psychiatry

## 2020-11-10 NOTE — Progress Notes (Signed)
Cardiology Office Note:    Date:  11/11/2020   ID:  Sierra [name], DOB 1980/04/29, MRN [id number]  PCP:  Armando Gang, FNP  [facility] Cardiologist:  Dr[facility] Electrophysiologist:  None   Referring MD: Armando Gang, FNP   Chief Complaint: same day echo  History of Present Illness:    Sierra [name] is a 40 y.o. female with a hx of ADHD, anxiety, Raynaud's disease who is being seen for tachycardia follow-up and echo.   Noted to have elevated heart rates by psychiatris on methylphenidate, Vyvanse.   Today, patient had the echo. Unofficial read by Dr. Azucena Cecil showed low normal EF 50% with normal diastolic function. Patient has some palpitations when she is exerting herself, but not at rest. EKG shows HR 96bpm. Pulse ox shows it went down to the 80s during the visit. No chest pain or SOB. She is only on Strattera for ADHD. She was taken off stimulants due to higher heart rates and placed on Strattera, feels this is not helping her concentration with school work.   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   Anemia    Anxiety state, unspecified    Bipolar 1 disorder, depressed (HCC) 05/21/2017   BIPOLAR AFFECTIVE DISORDER 12/23/2006   Qualifier: Diagnosis of  By: Ermalene Searing MD, Amy     Bipolar affective disorder (HCC) 10/12/2011   Bipolar disorder, unspecified (HCC)    Depressive disorder, not elsewhere classified    Edema    Esophageal reflux    Fibromyalgia    Headache    History of kidney stones    Mild or unspecified pre-eclampsia, unspecified as to episode of care    Morbid obesity (HCC)    Myalgia and myositis, unspecified    Other dyspnea and respiratory abnormality    Preeclampsia    with first pregnancy   Raynaud's disease    Urinary tract infection, site not specified     Past Surgical History:  Procedure Laterality Date   CESAREAN  SECTION     X 2   CHOLECYSTECTOMY  03/2006   CYSTOSCOPY W/ URETERAL STENT PLACEMENT Left 05/14/2016   Procedure: CYSTOSCOPY WITH STENT REPLACEMENT;  Surgeon: [name], MD;  Location: [facility];  Service: Urology;  Laterality: Left;   CYSTOSCOPY WITH STENT PLACEMENT Left 04/25/2016   Procedure: CYSTOSCOPY WITH STENT PLACEMENT;  Surgeon: [name], MD;  Location: [facility];  Service: Urology;  Laterality: Left;   DILATION AND CURETTAGE OF UTERUS     INTRAUTERINE DEVICE (IUD) INSERTION     paragard removed & mirena inserted   IVC FILTER INSERTION  03/26/2016   [facility]   LAPAROSCOPIC GASTRIC RESTRICTIVE DUODENAL PROCEDURE (DUODENAL SWITCH)  03/2016   LITHOTRIPSY     TONSILLECTOMY AND ADENOIDECTOMY     URETEROSCOPY WITH HOLMIUM LASER LITHOTRIPSY Left 05/14/2016   Procedure: URETEROSCOPY WITH HOLMIUM LASER LITHOTRIPSY;  Surgeon: [name], MD;  Location: [facility];  Service: Urology;  Laterality: Left;    Current Medications: Current Meds  Medication Sig   atomoxetine (STRATTERA) 40 MG capsule TAKE 1 CAPSULE (40 MG TOTAL) BY MOUTH DAILY.   Biotin 5000 MCG TABS Take 1 tablet by mouth daily.   BLISOVI 24 FE 1-20 MG-MCG(24) tablet TAKE 1 TABLET BY MOUTH EVERY DAY   calcium citrate-vitamin D 500-400 MG-UNIT chewable tablet Chew 1 tablet by mouth daily.  cetirizine (ZYRTEC) 10 MG tablet Take 10 mg by mouth daily.   clonazePAM (KLONOPIN) 0.5 MG tablet Take 1 tablet (0.5 mg total) by mouth as directed. Take once every few days for severe panic attacks only.   cyclobenzaprine (FLEXERIL) 10 MG tablet Take 10 mg by mouth 2 (two) times daily as needed for muscle spasms.   docusate sodium (COLACE) 100 MG capsule Take 300 mg by mouth daily.   ferrous sulfate 325 (65 FE) MG tablet Take 325 mg by mouth daily with breakfast.   gabapentin (NEURONTIN) 100 MG capsule Take 4 capsules (400 mg total) by mouth at bedtime.   hydrOXYzine (ATARAX/VISTARIL) 25 MG tablet Take 1 tablet (25 mg total)  by mouth daily as needed for anxiety.   lamoTRIgine (LAMICTAL) 150 MG tablet Take 1 tablet (150 mg total) by mouth daily. Start taking with 25 mg daily   lamoTRIgine (LAMICTAL) 25 MG tablet Take 1 tablet (25 mg total) by mouth daily. To be added to 150 mg daily   levonorgestrel (MIRENA) 20 MCG/24HR IUD 1 each by Intrauterine route once.   Lurasidone HCl (LATUDA) 60 MG TABS Take 1 tablet (60 mg total) by mouth daily with supper.   montelukast (SINGULAIR) 10 MG tablet Take 10 mg by mouth daily.   Multiple Vitamin (MULTIVITAMIN) tablet Take 1 tablet by mouth daily.   NIFEdipine (PROCARDIA-XL/ADALAT CC) 60 MG 24 hr tablet Take 60 mg by mouth daily.    nystatin-triamcinolone ointment (MYCOLOG) Apply 1 application topically daily.   omeprazole (PRILOSEC) 20 MG capsule Take 20 mg by mouth daily.   zaleplon (SONATA) 10 MG capsule Take 1 capsule (10 mg total) by mouth at bedtime as needed.     Allergies:   Uncaria tomentosa (cats claw), Bee pollen, Molds & smuts, Pollen extract, Milk-related compounds, Pineapple, Soy allergy, and Strawberry extract   Social History   Socioeconomic History   Marital status: Married    Spouse name: brian   Number of children: 2   Years of education: Not on file   Highest education level: Some college, no degree  Occupational History   Occupation: Arts development officer  Tobacco Use   Smoking status: Never   Smokeless tobacco: Never  Vaping Use   Vaping Use: Never used  Substance and Sexual Activity   Alcohol use: Yes    Alcohol/week: 0.0 standard drinks    Comment: occassional   Drug use: No   Sexual activity: Yes    Partners: Male    Birth control/protection: I.U.D., OCP    Comment: mirena inserted 09-01-18  Other Topics Concern   Not on file  Social History Narrative   1 child, 2 step-sons      Regular exercise-no   Diet: no fast food, likes sweets   Social Determinants of Health   Financial Resource Strain: Not on file  Food Insecurity: Not on file   Transportation Needs: Not on file  Physical Activity: Not on file  Stress: Not on file  Social Connections: Not on file     Family History: The patient's family history includes Alcohol abuse in her brother and father; Coronary artery disease in her maternal grandfather and maternal grandmother; Depression in her brother, brother, father, mother, and sister; Diabetes in her maternal grandmother; Emphysema in her maternal grandfather; Fibromyalgia in an other family member; Heart attack in her maternal grandmother; Hyperlipidemia in her brother, brother, and mother; Lung cancer in her maternal grandfather; Lupus in an other family member; Sleep apnea in her mother.  ROS:   Please see the history of present illness.     All other systems reviewed and are negative.  EKGs/Labs/Other Studies Reviewed:    The following studies were reviewed today:   Echo unofficial read: EF50%, normal DD  EKG:  EKG is  ordered today.  The ekg ordered today demonstrates NSR, 96bpm, nonspecific T wave changes  Recent Labs: 08/05/2020: BUN 12; Creatinine, Ser 0.91  Recent Lipid Panel    Component Value Date/Time   CHOL 105 05/22/2017 0643   TRIG 41 05/22/2017 0643   HDL 48 05/22/2017 0643   CHOLHDL 2.2 05/22/2017 0643   VLDL 8 05/22/2017 0643   LDLCALC 49 05/22/2017 0643     Physical Exam:    VS:  BP 118/80 (BP Location: Left Arm, Patient Position: Sitting, Cuff Size: Large)   Pulse 96   Ht 5\' 4"  (1.626 m)   Wt (!) 335 lb 6 oz (152.1 kg)   SpO2 99%   BMI 57.57 kg/m     Wt Readings from Last 3 Encounters:  11/11/20 (!) 335 lb 6 oz (152.1 kg)  10/10/20 (!) 339 lb (153.8 kg)  09/23/20 (!) 338 lb 3.2 oz (153.4 kg)     GEN:  Well nourished, well developed in no acute distress HEENT: Normal NECK: No JVD; No carotid bruits LYMPHATICS: No lymphadenopathy CARDIAC: RRR, no murmurs, rubs, gallops RESPIRATORY:  Clear to auscultation without rales, wheezing or rhonchi  ABDOMEN: Soft, non-tender,  non-distended MUSCULOSKELETAL:  No edema; No deformity  SKIN: Warm and dry NEUROLOGIC:  Alert and oriented x 3 PSYCHIATRIC:  Normal affect   ASSESSMENT:    1. Tachycardia   2. DOE (dyspnea on exertion)    PLAN:    In order of problems listed above:  Tachycardia Heart rates in the 80-90s, at the last visit noted to be in the 70s. EKG today showed heart rate 96bpm, which is normal. Patient reports heart rates elevated on exertion, which is a normal response. Echo showed low normal EF 50%, normal diasotlic dysfunction. She denies chest pain, SOB, palpitations. From a cardiac perspective no further work-up required. If heart rates are noted to be persistently elevated greater than 100 can consider BB therapy.   DOE/Morbid obesity Echo results as above. Recommend weight loss.   Disposition: Follow up  PRN  with Md/APP   Signed, Annslee Tercero 11/23/20, PA-C  11/11/2020 12:16 PM    [facility]

## 2020-11-11 ENCOUNTER — Encounter: Payer: Self-pay | Admitting: Medical

## 2020-11-11 ENCOUNTER — Ambulatory Visit (INDEPENDENT_AMBULATORY_CARE_PROVIDER_SITE_OTHER): Admitting: Medical

## 2020-11-11 ENCOUNTER — Ambulatory Visit (INDEPENDENT_AMBULATORY_CARE_PROVIDER_SITE_OTHER)

## 2020-11-11 ENCOUNTER — Other Ambulatory Visit: Payer: Self-pay

## 2020-11-11 VITALS — BP 118/80 | HR 96 | Ht 64.0 in | Wt 335.4 lb

## 2020-11-11 DIAGNOSIS — R0609 Other forms of dyspnea: Secondary | ICD-10-CM | POA: Diagnosis not present

## 2020-11-11 DIAGNOSIS — R000 Tachycardia, unspecified: Secondary | ICD-10-CM

## 2020-11-11 LAB — ECHOCARDIOGRAM COMPLETE
AR max vel: 3.31 cm2
AV Area VTI: 2.94 cm2
AV Area mean vel: 2.9 cm2
AV Mean grad: 6 mmHg
AV Peak grad: 9.2 mmHg
Ao pk vel: 1.52 m/s
Area-P 1/2: 4.29 cm2
S' Lateral: 4.1 cm

## 2020-11-11 MED ORDER — PERFLUTREN LIPID MICROSPHERE
1.0000 mL | INTRAVENOUS | Status: AC | PRN
  Administered 2020-11-11: 2 mL via INTRAVENOUS

## 2020-11-11 NOTE — Patient Instructions (Signed)
Medication Instructions:  Your physician recommends that you continue on your current medications as directed. Please refer to the Current Medication list given to you today.  *If you need a refill on your cardiac medications before your next appointment, please call your pharmacy*   Lab Work: None If you have labs (blood work) drawn today and your tests are completely normal, you will receive your results only by: MyChart Message (if you have MyChart) OR A paper copy in the mail If you have any lab test that is abnormal or we need to change your treatment, we will call you to review the results.   Testing/Procedures: None   Follow-Up: At [facility], you and your health needs are our priority.  As part of our continuing mission to provide you with exceptional heart care, we have created designated Provider Care Teams.  These Care Teams include your primary Cardiologist (physician) and Advanced Practice Providers (APPs -  Physician Assistants and Nurse Practitioners) who all work together to provide you with the care you need, when you need it.  Your next appointment:   Follow up as needed

## 2020-11-14 ENCOUNTER — Ambulatory Visit (INDEPENDENT_AMBULATORY_CARE_PROVIDER_SITE_OTHER): Admitting: Psychiatry

## 2020-11-14 ENCOUNTER — Encounter: Payer: Self-pay | Admitting: Psychiatry

## 2020-11-14 ENCOUNTER — Other Ambulatory Visit: Payer: Self-pay

## 2020-11-14 VITALS — BP 133/75 | HR 96 | Ht 64.0 in | Wt 332.0 lb

## 2020-11-14 DIAGNOSIS — F900 Attention-deficit hyperactivity disorder, predominantly inattentive type: Secondary | ICD-10-CM | POA: Diagnosis not present

## 2020-11-14 DIAGNOSIS — F3176 Bipolar disorder, in full remission, most recent episode depressed: Secondary | ICD-10-CM

## 2020-11-14 DIAGNOSIS — G4701 Insomnia due to medical condition: Secondary | ICD-10-CM

## 2020-11-14 DIAGNOSIS — F411 Generalized anxiety disorder: Secondary | ICD-10-CM

## 2020-11-14 DIAGNOSIS — F5081 Binge eating disorder: Secondary | ICD-10-CM

## 2020-11-14 MED ORDER — LAMOTRIGINE 150 MG PO TABS: 150.0000 mg | ORAL_TABLET | Freq: Every day | ORAL | 0 refills | Status: DC

## 2020-11-14 MED ORDER — DEXMETHYLPHENIDATE HCL ER 10 MG PO CP24: 10.0000 mg | ORAL_CAPSULE | Freq: Every day | ORAL | 0 refills | Status: DC

## 2020-11-14 MED ORDER — LAMOTRIGINE 25 MG PO TABS: 25.0000 mg | ORAL_TABLET | Freq: Every day | ORAL | 0 refills | Status: DC

## 2020-11-14 NOTE — Progress Notes (Signed)
BH MD OP Progress Note  11/14/2020 9:02 AM Sierra KEEFAUVER  MRN:  [id number]  Chief Complaint:  Chief Complaint   Follow-up; Anxiety; ADD    HPI: Sierra [name] is a 40 year old Caucasian female, married, lives in [place], has a history of bipolar disorder, GAD, binge eating disorder, ADHD, obesity, history of bariatric surgery was evaluated in office today.  Patient today reports she continues to struggle with low motivation, concentration problems, low energy level.  She reports she has been struggling at school with her grades and assignments.  She hence is interested in going back on a stimulant.  She does not believe the Strattera is beneficial.  Patient had cardiology consultation, reviewed notes on 10/10/2020, 11/11/2020-Dr.Agbor Sandie Ano , Ms. Furth -heart rate in the 80s to 90s, at the last visit noted to be in the 70s.  EKG showed heart rate 96 bpm which is normal.  Patient with heart rate increase on exertion which is a normal response.  Echo showed low normal EF 50%, normal diastolic function.  From a cardiac perspective no further work-up required.  Heart rate is noted to be persistently elevated greater than 100, could consider beta-blocker therapy.  Recommended weight loss.  Patient overall reports mood symptoms are stable.  She does feel sad about the fact that she is having trouble at school with concentration otherwise denies any significant depressive symptoms.  Reports sleep as good.  Appetite is lower however she has been eating enough.  She is interested in weight loss and may have lost a few pounds.  Patient denies any suicidality, homicidality or perceptual disturbances.  Patient is compliant on medications, denies side effects.  Patient denies any other concerns today  Visit Diagnosis:    ICD-10-CM   1. Bipolar disorder, in full remission, most recent episode depressed (HCC)  F31.76 lamoTRIgine (LAMICTAL) 150 MG tablet    2. GAD (generalized anxiety  disorder)  F41.1     3. Attention deficit hyperactivity disorder (ADHD), predominantly inattentive type  F90.0 dexmethylphenidate (FOCALIN XR) 10 MG 24 hr capsule    4. Insomnia due to medical condition  G47.01 lamoTRIgine (LAMICTAL) 25 MG tablet   frequent urination    5. Binge eating disorder  F50.81       Past Psychiatric History: Reviewed past psychiatric history from progress note on 06/20/2017.  Past Medical History:  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   Anemia    Anxiety state, unspecified    Bipolar 1 disorder, depressed (HCC) 05/21/2017   BIPOLAR AFFECTIVE DISORDER 12/23/2006   Qualifier: Diagnosis of  By: Ermalene Searing MD, Amy     Bipolar affective disorder (HCC) 10/12/2011   Bipolar disorder, unspecified (HCC)    Depressive disorder, not elsewhere classified    Edema    Esophageal reflux    Fibromyalgia    Headache    History of kidney stones    Mild or unspecified pre-eclampsia, unspecified as to episode of care    Morbid obesity (HCC)    Myalgia and myositis, unspecified    Other dyspnea and respiratory abnormality    Preeclampsia    with first pregnancy   Raynaud's disease    Urinary tract infection, site not specified     Past Surgical History:  Procedure Laterality Date   CESAREAN SECTION     X 2   CHOLECYSTECTOMY  03/2006   CYSTOSCOPY W/ URETERAL STENT  PLACEMENT Left 05/14/2016   Procedure: CYSTOSCOPY WITH STENT REPLACEMENT;  Surgeon: [name], MD;  Location: [facility];  Service: Urology;  Laterality: Left;   CYSTOSCOPY WITH STENT PLACEMENT Left 04/25/2016   Procedure: CYSTOSCOPY WITH STENT PLACEMENT;  Surgeon: [name], MD;  Location: [facility];  Service: Urology;  Laterality: Left;   DILATION AND CURETTAGE OF UTERUS     INTRAUTERINE DEVICE (IUD) INSERTION     paragard removed & mirena inserted   IVC FILTER INSERTION  03/26/2016   [facility]    LAPAROSCOPIC GASTRIC RESTRICTIVE DUODENAL PROCEDURE (DUODENAL SWITCH)  03/2016   LITHOTRIPSY     TONSILLECTOMY AND ADENOIDECTOMY     URETEROSCOPY WITH HOLMIUM LASER LITHOTRIPSY Left 05/14/2016   Procedure: URETEROSCOPY WITH HOLMIUM LASER LITHOTRIPSY;  Surgeon: [name], MD;  Location: [facility];  Service: Urology;  Laterality: Left;    Family Psychiatric History: Reviewed family psychiatric history from progress note on 06/20/2017  Family History:  Family History  Problem Relation Age of Onset   Depression Father    Alcohol abuse Father    Depression Mother    Hyperlipidemia Mother    Sleep apnea Mother    Depression Sister    Hyperlipidemia Brother    Depression Brother    Hyperlipidemia Brother    Depression Brother    Alcohol abuse Brother    Coronary artery disease Maternal Grandmother    Heart attack Maternal Grandmother    Diabetes Maternal Grandmother    Lung cancer Maternal Grandfather    Emphysema Maternal Grandfather    Coronary artery disease Maternal Grandfather    Lupus Other        Aunt   Fibromyalgia Other        Aunt    Social History: Reviewed social history from progress note on 06/20/2017 Social History   Socioeconomic History   Marital status: Married    Spouse name: brian   Number of children: 2   Years of education: Not on file   Highest education level: Some college, no degree  Occupational History   Occupation: Arts development officer  Tobacco Use   Smoking status: Never   Smokeless tobacco: Never  Vaping Use   Vaping Use: Never used  Substance and Sexual Activity   Alcohol use: Yes    Alcohol/week: 0.0 standard drinks    Comment: occassional   Drug use: No   Sexual activity: Yes    Partners: Male    Birth control/protection: I.U.D., OCP    Comment: mirena inserted 09-01-18  Other Topics Concern   Not on file  Social History Narrative   1 child, 2 step-sons      Regular exercise-no   Diet: no fast food, likes sweets   Social Determinants  of Health   Financial Resource Strain: Not on file  Food Insecurity: Not on file  Transportation Needs: Not on file  Physical Activity: Not on file  Stress: Not on file  Social Connections: Not on file    Allergies:  Allergies  Allergen Reactions   Uncaria Tomentosa (Cats Claw) Other (See Comments)    allgeric to cats in general causes sneezing   Bee Pollen Itching   Molds & Smuts Other (See Comments)   Pollen Extract    Milk-Related Compounds     Able to tolerate yogurt; mild intolerance to milk/cheese   Pineapple Rash   Soy Allergy Diarrhea   Strawberry Extract Rash    Break out on tongue noted Break out on tongue noted Break out  on tongue noted    Metabolic Disorder Labs: Lab Results  Component Value Date   HGBA1C 4.7 (L) 05/22/2017   MPG 88.19 05/22/2017   Lab Results  Component Value Date   PROLACTIN 7.6 11/07/2018   Lab Results  Component Value Date   CHOL 105 05/22/2017   TRIG 41 05/22/2017   HDL 48 05/22/2017   CHOLHDL 2.2 05/22/2017   VLDL 8 05/22/2017   LDLCALC 49 05/22/2017   LDLCALC 99 09/11/2013   Lab Results  Component Value Date   TSH 0.504 05/21/2018   TSH 0.677 05/22/2017    Therapeutic Level Labs: Lab Results  Component Value Date   LITHIUM 0.17 (L) 06/27/2020   LITHIUM 0.12 (L) 06/01/2020   No results found for: VALPROATE No components found for:  CBMZ  Current Medications: Current Outpatient Medications  Medication Sig Dispense Refill   Biotin 5000 MCG TABS Take 1 tablet by mouth daily.     BLISOVI 24 FE 1-20 MG-MCG(24) tablet TAKE 1 TABLET BY MOUTH EVERY DAY 84 tablet 4   calcium citrate-vitamin D 500-400 MG-UNIT chewable tablet Chew 1 tablet by mouth daily.      cetirizine (ZYRTEC) 10 MG tablet Take 10 mg by mouth daily.     clonazePAM (KLONOPIN) 0.5 MG tablet Take 1 tablet (0.5 mg total) by mouth as directed. Take once every few days for severe panic attacks only. 10 tablet 1   cyclobenzaprine (FLEXERIL) 10 MG tablet Take  10 mg by mouth 2 (two) times daily as needed for muscle spasms.     dexmethylphenidate (FOCALIN XR) 10 MG 24 hr capsule Take 1 capsule (10 mg total) by mouth daily. 30 capsule 0   docusate sodium (COLACE) 100 MG capsule Take 300 mg by mouth daily.     ferrous sulfate 325 (65 FE) MG tablet Take 325 mg by mouth daily with breakfast.     gabapentin (NEURONTIN) 100 MG capsule Take 4 capsules (400 mg total) by mouth at bedtime. 360 capsule 1   hydrOXYzine (ATARAX/VISTARIL) 25 MG tablet Take 1 tablet (25 mg total) by mouth daily as needed for anxiety. 90 tablet 1   levonorgestrel (MIRENA) 20 MCG/24HR IUD 1 each by Intrauterine route once.     Lurasidone HCl (LATUDA) 60 MG TABS Take 1 tablet (60 mg total) by mouth daily with supper. 90 tablet 0   montelukast (SINGULAIR) 10 MG tablet Take 10 mg by mouth daily.     Multiple Vitamin (MULTIVITAMIN) tablet Take 1 tablet by mouth daily.     NIFEdipine (PROCARDIA-XL/ADALAT CC) 60 MG 24 hr tablet Take 60 mg by mouth daily.      nystatin-triamcinolone ointment (MYCOLOG) Apply 1 application topically daily. 30 g 3   omeprazole (PRILOSEC) 20 MG capsule Take 20 mg by mouth daily.     zaleplon (SONATA) 10 MG capsule Take 1 capsule (10 mg total) by mouth at bedtime as needed. 30 capsule 2   lamoTRIgine (LAMICTAL) 150 MG tablet Take 1 tablet (150 mg total) by mouth daily. Start taking with 25 mg daily 90 tablet 0   lamoTRIgine (LAMICTAL) 25 MG tablet Take 1 tablet (25 mg total) by mouth daily. To be added to 150 mg daily 90 tablet 0   No current facility-administered medications for this visit.     Musculoskeletal: Strength & Muscle Tone: within normal limits Gait & Station: normal Patient leans: N/A  Psychiatric Specialty Exam: Review of Systems  Constitutional:  Positive for fatigue.  Psychiatric/Behavioral:  Positive for decreased  concentration.        Feels sad about her concentration problems and school work  All other systems reviewed and are  negative.  Blood pressure 133/75, pulse 96, height 5\' 4"  (1.626 m), weight (!) 332 lb (150.6 kg), SpO2 98 %.Body mass index is 56.99 kg/m.  General Appearance: Casual  Eye Contact:  Fair  Speech:  Clear and Coherent  Volume:  Normal  Mood:  Depressed  Affect:  Appropriate  Thought Process:  Goal Directed and Descriptions of Associations: Intact  Orientation:  Full (Time, Place, and Person)  Thought Content: Logical   Suicidal Thoughts:  No  Homicidal Thoughts:  No  Memory:  Immediate;   Fair Recent;   Fair Remote;   Fair  Judgement:  Fair  Insight:  Fair  Psychomotor Activity:  Normal  Concentration:  Concentration: Fair and Attention Span: Fair  Recall:  of Knowledge: Fair  Language: Fair  Akathisia:  No  Handed:  Right  AIMS (if indicated): done  Assets:  Communication Skills Desire for Improvement Housing Social Support Transportation Vocational/Educational  ADL's:  Intact  Cognition: WNL  Sleep:  Fair   Screenings: AIMS    Flowsheet Row Video Visit from 08/04/2020 in [facility] Video Visit from 03/01/2020 in [facility] Office Visit from 12/11/2017 in [facility] Office Visit from 11/29/2017 in [facility] Office Visit from 11[facility]20/2017 in [facility]  AIMS Total Score 0 8 1 7 5       AUDIT    Flowsheet Row Admission (Discharged) from 05/21/2017 in [facility]  Alcohol Use Disorder Identification Test Final Score (AUDIT) 6      GAD-7    Flowsheet Row Office Visit from 11/14/2020 in [facility]  Total GAD-7 Score 3      PHQ2-9    Flowsheet Row Office Visit from 11/14/2020 in [facility] Office Visit from 08/23/2020 in [facility] Office Visit from 08/10/2020 in [facility]  Video Visit from 08/04/2020 in [facility] Video Visit from 05/25/2020 in [facility]  PHQ-2 Total Score 1 2 5 6 3   PHQ-9 Total Score 7 6 17 20 5       Flowsheet Row Office Visit from 08/23/2020 in [facility] Office Visit from 08/10/2020 in [facility] Video Visit from 08/04/2020 in [facility]  C-SSRS RISK CATEGORY Error: Q3, 4, or 5 should not be populated when Q2 is No Error: Q3, 4, or 5 should not be populated when Q2 is No Error: Q3, 4, or 5 should not be populated when Q2 is No        Assessment and Plan: Sierra [name] is a 40 year old Caucasian female who has a history of bipolar disorder, anxiety disorder, binge eating disorder was evaluated in office today.  Patient with worsening concentration, attention, lack of response to Strattera, is interested in going back on a stimulant.  Discussed plan as noted below.  Plan Bipolar disorder type I depressed in remission Latuda 60 mg p.o. daily with meals Lamotrigine 175 mg p.o. daily Gabapentin 400 mg p.o. nightly. Sonata 10 mg p.o. nightly as needed  GAD-stable Gabapentin as prescribed. Hydroxyzine 25 mg p.o. daily as needed for severe anxiety attacks Klonopin 0.5 mg as needed for severe anxiety attacks only, patient limiting use Continue CBT with Ms. Tiffany  ADHD-unstable Discontinue Strattera for  lack of benefit Start Focalin XR 10 mg p.o. daily. Provided education, discussed side effect. Reviewed [place] PMP AWARE.  Binge eating disorder-stable We will monitor closely  I have reviewed notes per cardiology-dated 11/11/2020, 10/10/2020 as noted above.  Follow-up in clinic in 1 week or sooner in person.  This note was generated in part or whole with voice recognition software. Voice recognition is usually quite accurate but there are transcription errors that can and very often do occur.  I apologize for any typographical errors that were not detected and corrected.      Jomarie Longs, MD 11/14/2020, 9:02 AM

## 2020-11-23 ENCOUNTER — Ambulatory Visit (INDEPENDENT_AMBULATORY_CARE_PROVIDER_SITE_OTHER): Admitting: Psychiatry

## 2020-11-23 ENCOUNTER — Encounter: Payer: Self-pay | Admitting: Psychiatry

## 2020-11-23 ENCOUNTER — Other Ambulatory Visit: Payer: Self-pay

## 2020-11-23 VITALS — BP 138/91 | HR 82 | Temp 97.7°F | Wt 336.6 lb

## 2020-11-23 DIAGNOSIS — F900 Attention-deficit hyperactivity disorder, predominantly inattentive type: Secondary | ICD-10-CM

## 2020-11-23 DIAGNOSIS — F3132 Bipolar disorder, current episode depressed, moderate: Secondary | ICD-10-CM | POA: Diagnosis not present

## 2020-11-23 DIAGNOSIS — G4701 Insomnia due to medical condition: Secondary | ICD-10-CM

## 2020-11-23 DIAGNOSIS — F411 Generalized anxiety disorder: Secondary | ICD-10-CM | POA: Diagnosis not present

## 2020-11-23 DIAGNOSIS — F5081 Binge eating disorder: Secondary | ICD-10-CM

## 2020-11-23 MED ORDER — CARIPRAZINE HCL 1.5 MG PO CAPS: 1.5000 mg | ORAL_CAPSULE | Freq: Every day | ORAL | 0 refills | Status: DC

## 2020-11-23 NOTE — Progress Notes (Signed)
BH MD OP Progress Note  11/23/2020 1:02 PM Sierra [name]  MRN:  [id number]  Chief Complaint:  Chief Complaint   Follow-up; Depression; ADD    HPI: Sierra [name] is a 40 year old Caucasian female, married, lives in [place], has a history of bipolar disorder, GAD, binge eating disorder, ADHD, obesity, history of bariatric surgery was evaluated in office today.  Patient today returns reporting worsening depressive symptoms.  She struggles with low motivation, anhedonia, fatigue, concentration problems, sadness on a regular basis.  She reports she is tired of feeling this way and is interested in medication change.  She does not believe the Latuda is beneficial.  She may have felt better initially when she was on the Abilify however it stopped working afterwards.  Patient is currently not doing well in school.  She reports she is not happy with her grades.  Patient reports the Focalin does help to some extent with her attention and focus however it does not last and the effect wears off in the afternoon.  She also reports the dosage is not enough ,she may need a dosage increase.  She currently denies side effects.  Patient with multiple psychosocial stressors including being in school, relationship struggles with her spouse.  Patient denies any suicidality, homicidality or perceptual disturbances.  Patient denies any other concerns today.  Visit Diagnosis:    ICD-10-CM   1. Bipolar 1 disorder, depressed, moderate (HCC)  F31.32 cariprazine (VRAYLAR) 1.5 MG capsule    2. GAD (generalized anxiety disorder)  F41.1     3. Attention deficit hyperactivity disorder (ADHD), predominantly inattentive type  F90.0     4. Insomnia due to medical condition  G47.01    frequent urination    5. Binge eating disorder  F50.81       Past Psychiatric History: Reviewed past psychiatric history from progress note on 06/20/2017  Past Medical History:  Past Medical History:  Diagnosis Date    Abdominal pain, other specified site    ADHD    Affective bipolar disorder (HCC) 10/12/2011   Anemia    Anxiety state, unspecified    Bipolar 1 disorder, depressed (HCC) 05/21/2017   BIPOLAR AFFECTIVE DISORDER 12/23/2006   Qualifier: Diagnosis of  By: Ermalene Searing MD, Amy     Bipolar affective disorder (HCC) 10/12/2011   Bipolar disorder, unspecified (HCC)    Depressive disorder, not elsewhere classified    Edema    Esophageal reflux    Fibromyalgia    Headache    History of kidney stones    Mild or unspecified pre-eclampsia, unspecified as to episode of care    Morbid obesity (HCC)    Myalgia and myositis, unspecified    Other dyspnea and respiratory abnormality    Preeclampsia    with first pregnancy   Raynaud's disease    Urinary tract infection, site not specified     Past Surgical History:  Procedure Laterality Date   CESAREAN SECTION     X 2   CHOLECYSTECTOMY  03/2006   CYSTOSCOPY W/ URETERAL STENT PLACEMENT Left 05/14/2016   Procedure: CYSTOSCOPY WITH STENT REPLACEMENT;  Surgeon: [name], MD;  Location: [facility];  Service: Urology;  Laterality: Left;   CYSTOSCOPY WITH STENT PLACEMENT Left 04/25/2016   Procedure: CYSTOSCOPY WITH STENT PLACEMENT;  Surgeon: [name], MD;  Location: [facility];  Service: Urology;  Laterality: Left;   DILATION AND CURETTAGE OF UTERUS     INTRAUTERINE DEVICE (IUD) INSERTION     paragard removed & mirena  inserted   IVC FILTER INSERTION  03/26/2016   [facility]   LAPAROSCOPIC GASTRIC RESTRICTIVE DUODENAL PROCEDURE (DUODENAL SWITCH)  03/2016   LITHOTRIPSY     TONSILLECTOMY AND ADENOIDECTOMY     URETEROSCOPY WITH HOLMIUM LASER LITHOTRIPSY Left 05/14/2016   Procedure: URETEROSCOPY WITH HOLMIUM LASER LITHOTRIPSY;  Surgeon: [name], MD;  Location: [facility];  Service: Urology;  Laterality: Left;    Family Psychiatric History: Reviewed family psychiatric history from progress note on 06/20/2017  Family History:  Family History   Problem Relation Age of Onset   Depression Father    Alcohol abuse Father    Depression Mother    Hyperlipidemia Mother    Sleep apnea Mother    Depression Sister    Hyperlipidemia Brother    Depression Brother    Hyperlipidemia Brother    Depression Brother    Alcohol abuse Brother    Coronary artery disease Maternal Grandmother    Heart attack Maternal Grandmother    Diabetes Maternal Grandmother    Lung cancer Maternal Grandfather    Emphysema Maternal Grandfather    Coronary artery disease Maternal Grandfather    Lupus Other        Aunt   Fibromyalgia Other        Aunt    Social History: Reviewed social history from progress note on 06/20/2017 Social History   Socioeconomic History   Marital status: Married    Spouse name: brian   Number of children: 2   Years of education: Not on file   Highest education level: Some college, no degree  Occupational History   Occupation: Arts development officer  Tobacco Use   Smoking status: Never   Smokeless tobacco: Never  Vaping Use   Vaping Use: Never used  Substance and Sexual Activity   Alcohol use: Yes    Alcohol/week: 0.0 standard drinks    Comment: occassional   Drug use: No   Sexual activity: Yes    Partners: Male    Birth control/protection: I.U.D., OCP    Comment: mirena inserted 09-01-18  Other Topics Concern   Not on file  Social History Narrative   1 child, 2 step-sons      Regular exercise-no   Diet: no fast food, likes sweets   Social Determinants of Health   Financial Resource Strain: Not on file  Food Insecurity: Not on file  Transportation Needs: Not on file  Physical Activity: Not on file  Stress: Not on file  Social Connections: Not on file    Allergies:  Allergies  Allergen Reactions   Uncaria Tomentosa (Cats Claw) Other (See Comments)    allgeric to cats in general causes sneezing   Bee Pollen Itching   Molds & Smuts Other (See Comments)   Pollen Extract    Milk-Related Compounds     Able to  tolerate yogurt; mild intolerance to milk/cheese   Pineapple Rash   Soy Allergy Diarrhea   Strawberry Extract Rash    Break out on tongue noted Break out on tongue noted Break out on tongue noted    Metabolic Disorder Labs: Lab Results  Component Value Date   HGBA1C 4.7 (L) 05/22/2017   MPG 88.19 05/22/2017   Lab Results  Component Value Date   PROLACTIN 7.6 11/07/2018   Lab Results  Component Value Date   CHOL 105 05/22/2017   TRIG 41 05/22/2017   HDL 48 05/22/2017   CHOLHDL 2.2 05/22/2017   VLDL 8 05/22/2017   LDLCALC 49 05/22/2017  LDLCALC 99 09/11/2013   Lab Results  Component Value Date   TSH 0.504 05/21/2018   TSH 0.677 05/22/2017    Therapeutic Level Labs: Lab Results  Component Value Date   LITHIUM 0.17 (L) 06/27/2020   LITHIUM 0.12 (L) 06/01/2020   No results found for: VALPROATE No components found for:  CBMZ  Current Medications: Current Outpatient Medications  Medication Sig Dispense Refill   Biotin 5000 MCG TABS Take 1 tablet by mouth daily.     BLISOVI 24 FE 1-20 MG-MCG(24) tablet TAKE 1 TABLET BY MOUTH EVERY DAY 84 tablet 4   calcium citrate-vitamin D 500-400 MG-UNIT chewable tablet Chew 1 tablet by mouth daily.      cariprazine (VRAYLAR) 1.5 MG capsule Take 1 capsule (1.5 mg total) by mouth daily. 30 capsule 0   cetirizine (ZYRTEC) 10 MG tablet Take 10 mg by mouth daily.     clonazePAM (KLONOPIN) 0.5 MG tablet Take 1 tablet (0.5 mg total) by mouth as directed. Take once every few days for severe panic attacks only. 10 tablet 1   cyclobenzaprine (FLEXERIL) 10 MG tablet Take 10 mg by mouth 2 (two) times daily as needed for muscle spasms.     dexmethylphenidate (FOCALIN XR) 10 MG 24 hr capsule Take 1 capsule (10 mg total) by mouth daily. 30 capsule 0   docusate sodium (COLACE) 100 MG capsule Take 300 mg by mouth daily.     ferrous sulfate 325 (65 FE) MG tablet Take 325 mg by mouth daily with breakfast.     gabapentin (NEURONTIN) 100 MG capsule  Take 4 capsules (400 mg total) by mouth at bedtime. 360 capsule 1   hydrOXYzine (ATARAX/VISTARIL) 25 MG tablet Take 1 tablet (25 mg total) by mouth daily as needed for anxiety. 90 tablet 1   lamoTRIgine (LAMICTAL) 150 MG tablet Take 1 tablet (150 mg total) by mouth daily. Start taking with 25 mg daily 90 tablet 0   lamoTRIgine (LAMICTAL) 25 MG tablet Take 1 tablet (25 mg total) by mouth daily. To be added to 150 mg daily 90 tablet 0   levonorgestrel (MIRENA) 20 MCG/24HR IUD 1 each by Intrauterine route once.     montelukast (SINGULAIR) 10 MG tablet Take 10 mg by mouth daily.     Multiple Vitamin (MULTIVITAMIN) tablet Take 1 tablet by mouth daily.     NIFEdipine (PROCARDIA-XL/ADALAT CC) 60 MG 24 hr tablet Take 60 mg by mouth daily.      nystatin-triamcinolone ointment (MYCOLOG) Apply 1 application topically daily. 30 g 3   omeprazole (PRILOSEC) 20 MG capsule Take 20 mg by mouth daily.     zaleplon (SONATA) 10 MG capsule Take 1 capsule (10 mg total) by mouth at bedtime as needed. 30 capsule 2   No current facility-administered medications for this visit.     Musculoskeletal: Strength & Muscle Tone: within normal limits Gait & Station: normal Patient leans: N/A  Psychiatric Specialty Exam: Review of Systems  Psychiatric/Behavioral:  Positive for decreased concentration and dysphoric mood.   All other systems reviewed and are negative.  Blood pressure (!) 138/91, pulse 82, temperature 97.7 F (36.5 C), temperature source Temporal, weight (!) 336 lb 9.6 oz (152.7 kg).Body mass index is 57.78 kg/m.  General Appearance: Casual  Eye Contact:  Fair  Speech:  Normal Rate  Volume:  Normal  Mood:  Depressed  Affect:  Congruent  Thought Process:  Goal Directed and Descriptions of Associations: Intact  Orientation:  Full (Time, Place, and Person)  Thought  Content: Logical   Suicidal Thoughts:  No  Homicidal Thoughts:  No  Memory:  Immediate;   Fair Recent;   Fair Remote;   Fair   Judgement:  Fair  Insight:  Fair  Psychomotor Activity:  Normal  Concentration:  Concentration: Fair and Attention Span: Fair  Recall:  [employer] of Knowledge: Fair  Language: Fair  Akathisia:  No  Handed:  Right  AIMS (if indicated): done  Assets:  Communication Skills Desire for Improvement Housing Social Support Talents/Skills Transportation Vocational/Educational  ADL's:  Intact  Cognition: WNL  Sleep:  Fair   Screenings: AIMS    Flowsheet Row Video Visit from 08/04/2020 in [facility] Video Visit from 03/01/2020 in [facility] Office Visit from 12/11/2017 in [facility] Office Visit from 11/29/2017 in [facility] Office Visit from 11/20/2017 in [facility]  AIMS Total Score 0 8 1 7 5       AUDIT    Flowsheet Row Admission (Discharged) from 05/21/2017 in [facility]  Alcohol Use Disorder Identification Test Final Score (AUDIT) 6      GAD-7    Flowsheet Row Office Visit from 11/14/2020 in [facility]  Total GAD-7 Score 3      PHQ2-9    Flowsheet Row Office Visit from 11/23/2020 in [facility] Office Visit from 11/14/2020 in [facility] Office Visit from 08/23/2020 in [facility] Office Visit from 08/10/2020 in [facility] Video Visit from 08/04/2020 in [facility]  PHQ-2 Total Score 3 1 2 5 6   PHQ-9 Total Score 7 7 6 17 20       Flowsheet Row Office Visit from 11/23/2020 in [facility] Office Visit from 08/23/2020 in [facility] Office Visit from 08/10/2020 in [facility]  C-SSRS RISK CATEGORY No Risk Error: Q3, 4, or 5 should not be populated when Q2 is  No Error: Q3, 4, or 5 should not be populated when Q2 is No        Assessment and Plan: KAVERI PERRAS is a 40 year old Caucasian female who has a history of bipolar disorder, anxiety disorder, binge eating disorder was evaluated in office today.  Patient with depression symptoms, attention and focus problems with some benefit from the [facility], will continue to benefit from medication readjustment.  Plan Bipolar disorder type I depressed-unstable Discontinue Latuda. Start Vraylar 1.5 mg p.o. daily Lamotrigine 175 mg p.o. daily Gabapentin 400 mg p.o. nightly Sonata 10 mg p.o. nightly as needed  GAD-stable Gabapentin as prescribed Hydroxyzine 25 mg p.o. daily as needed for severe anxiety attacks Klonopin 0.5 mg as needed for severe anxiety attacks only. Continue CBT with Ms. Tiffany  ADHD-unstable Focalin XR 10 mg p.o. daily We will consider increasing the dosage of Focalin XR as well as adding Focalin immediate release in the afternoon as needed. Reviewed [place] PMP aware  Binge eating disorder-stable We will monitor closely Continue CBT  I have sent a referral for transcranial magnetic stimulation to [facility], since patient is interested.  Crisis plan discussed with patient.  Patient to go to [facility].  Follow-up in clinic in 1 week or sooner in person.  This note was generated in part or whole with voice recognition software. Voice recognition is usually quite accurate but there are transcription errors that can and very often do occur. I apologize  for any typographical errors that were not detected and corrected.        Jomarie Longs, MD 11/24/2020, 8:09 AM

## 2020-11-23 NOTE — Patient Instructions (Signed)
Cariprazine Capsules ?What is this medication? ?CARIPRAZINE (car i PRA zeen) treats schizophrenia and bipolar disorder. It works by balancing the levels of dopamine and serotonin in your brain, substances that help regulate mood, behaviors, and thoughts. It belongs to a group of medications called antipsychotics. Antipsychotic medications can be used to treat several kinds of mental health conditions. ?This medicine may be used for other purposes; ask your health care provider or pharmacist if you have questions. ?COMMON BRAND NAME(S): VRAYLAR ?What should I tell my care team before I take this medication? ?They need to know if you have any of these conditions: ?Dementia ?Diabetes ?Difficulty swallowing ?Have trouble controlling your muscles ?Heart disease ?High cholesterol ?History of breast cancer ?History of stroke ?Kidney disease ?Liver disease ?Low blood counts, like low white cell, platelet, or red cell counts ?Low blood pressure ?Parkinson's disease ?Seizures ?Suicidal thoughts, plans or attempt; a previous suicide attempt by you or a family member ?An unusual or allergic reaction to cariprazine, other medications, foods, dyes, or preservatives ?Pregnant or trying to get pregnant ?Breast-feeding ?How should I use this medication? ?Take this medication by mouth with a glass of water. Follow the directions on the prescription label. You may take it with or without food. Take your medication at regular intervals. Do not take it more often than directed. Do not stop taking except on your care team's advice. ?A special MedGuide will be given to you by the pharmacist with each prescription and refill. Be sure to read this information carefully each time. ?Talk to your care team about the use of this medication in children. Special care may be needed. ?Overdosage: If you think you have taken too much of this medicine contact a poison control center or emergency room at once. ?NOTE: This medicine is only for you. Do  not share this medicine with others. ?What if I miss a dose? ?If you miss a dose, take it as soon as you can. If it is almost time for your next dose, take only that dose. Do not take double or extra doses. ?What may interact with this medication? ?Do not take this medication with any of the following: ?Metoclopramide ?This medication may also interact with the following: ?Antihistamines for allergy, cough, and cold ?Carbamazepine ?Certain medications for anxiety or sleep ?Certain medications for depression like amitriptyline, fluoxetine, sertraline ?Certain medications for fungal infections like itraconazole, ketoconazole ?General anesthetics like halothane, isoflurane, methoxyflurane, propofol ?Levodopa or other medications for Parkinson's disease ?Medications for blood pressure ?Medications for seizures ?Medications that relax muscles for surgery ?Narcotic medications for pain ?Phenothiazines like chlorpromazine, prochlorperazine, thioridazine ?Rifampin ?This list may not describe all possible interactions. Give your health care provider a list of all the medicines, herbs, non-prescription drugs, or dietary supplements you use. Also tell them if you smoke, drink alcohol, or use illegal drugs. Some items may interact with your medicine. ?What should I watch for while using this medication? ?Visit your care team for regular checks on your progress. Tell your care team if symptoms do not start to get better or if they get worse. Do not stop taking except on your care team's advice. You may develop a severe reaction. Your care team will tell you how much medication to take. ?Patients and their families should watch out for new or worsening depression or thoughts of suicide. Also watch out for sudden changes in feelings such as feeling anxious, agitated, panicky, irritable, hostile, aggressive, impulsive, severely restless, overly excited and hyperactive, or not being able to   sleep. If this happens, especially at the  beginning of treatment or after a change in dose, call your care team. ?You may get dizzy or drowsy. Do not drive, use machinery, or do anything that needs mental alertness until you know how this medication affects you. Do not stand or sit up quickly, especially if you are an older patient. This reduces the risk of dizzy or fainting spells. Alcohol may interfere with the effect of this medication. Avoid alcoholic drinks. ?This medication may cause dry eyes and blurred vision. If you wear contact lenses you may feel some discomfort. Lubricating drops may help. See your eye doctor if the problem does not go away or is severe. ?This medication may increase blood sugar. Ask your care team if changes in diet or medications are needed if you have diabetes. ?This medication can cause problems with controlling your body temperature. It can lower the response of your body to cold temperatures. If possible, stay indoors during cold weather. If you must go outdoors, wear warm clothes. It can also lower the response of your body to heat. Do not overheat. Do not over-exercise. Stay out of the sun when possible. If you must be in the sun, wear cool clothing. Drink plenty of water. If you have trouble controlling your body temperature, call your care team right away. ?Women should inform their care team if they wish to become pregnant or think they might be pregnant. The effects of this medication on an unborn child are not known. A registry is available to monitor pregnancy outcomes in pregnant women exposed to this medication or similar medications. Talk to your care team or pharmacist for more information. ?What side effects may I notice from receiving this medication? ?Side effects that you should report to your care team as soon as possible: ?Allergic reactions--skin rash, itching, hives, swelling of the face, lips, tongue, or throat ?High blood sugar (hyperglycemia)--increased thirst or amount of urine, unusual weakness or  fatigue, blurry vision ?High fever, stiff muscles, increased sweating, fast or irregular heartbeat, and confusion, which may be signs of neuroleptic malignant syndrome ?Infection--fever, chills, cough, or sore throat ?Low blood pressure--dizziness, feeling faint or lightheaded, blurry vision ?Pain or trouble swallowing ?Seizures ?Stroke--sudden numbness or weakness of the face, arm, or leg, trouble speaking, confusion, trouble walking, loss of balance or coordination, dizziness, severe headache, change in vision ?Thoughts of suicide or self-harm, worsening mood, feelings of depression ?Uncontrolled and repetitive body movements, muscle stiffness or spasms, tremors or shaking, loss of balance or coordination, restlessness, shuffling walk, which may be signs of extrapyramidal symptoms (EPS) ?Side effects that usually do not require medical attention (report to your care team if they continue or are bothersome): ?Constipation ?Drowsiness ?Nausea ?Upset stomach ?Vomiting ?This list may not describe all possible side effects. Call your doctor for medical advice about side effects. You may report side effects to FDA at 1-800-FDA-1088. ?Where should I keep my medication? ?Keep out of the reach of children. ?Store at room temperature between 15 and 30 degrees C (59 and 86 degrees F). Protect from light. Throw away any unused medication after the expiration date. ?NOTE: This sheet is a summary. It may not cover all possible information. If you have questions about this medicine, talk to your doctor, pharmacist, or health care provider. ?? 2022 Elsevier/Gold Standard (2020-09-21 00:00:00) ? ?

## 2020-11-30 ENCOUNTER — Ambulatory Visit (INDEPENDENT_AMBULATORY_CARE_PROVIDER_SITE_OTHER): Admitting: Psychiatry

## 2020-11-30 ENCOUNTER — Other Ambulatory Visit: Payer: Self-pay

## 2020-11-30 ENCOUNTER — Encounter: Payer: Self-pay | Admitting: Psychiatry

## 2020-11-30 VITALS — BP 134/86 | HR 99 | Temp 98.3°F | Wt 338.4 lb

## 2020-11-30 DIAGNOSIS — F900 Attention-deficit hyperactivity disorder, predominantly inattentive type: Secondary | ICD-10-CM | POA: Diagnosis not present

## 2020-11-30 DIAGNOSIS — F5081 Binge eating disorder: Secondary | ICD-10-CM

## 2020-11-30 DIAGNOSIS — G4701 Insomnia due to medical condition: Secondary | ICD-10-CM | POA: Diagnosis not present

## 2020-11-30 DIAGNOSIS — F411 Generalized anxiety disorder: Secondary | ICD-10-CM

## 2020-11-30 DIAGNOSIS — F3132 Bipolar disorder, current episode depressed, moderate: Secondary | ICD-10-CM

## 2020-11-30 MED ORDER — CARIPRAZINE HCL 1.5 MG PO CAPS: 1.5000 mg | ORAL_CAPSULE | Freq: Every day | ORAL | 0 refills | Status: DC

## 2020-11-30 NOTE — Progress Notes (Signed)
BH MD OP Progress Note  11/30/2020 1:55 PM Sierra [name]  MRN:  [id number]  Chief Complaint:  Chief Complaint   Follow-up; Depression; ADD    HPI: Sierra [name] is a 40 year old Caucasian female, married, lives in [place], has a history of bipolar disorder, GAD, ADHD, insomnia, binge eating disorder, history of bariatric surgery was evaluated in office today.  Patient today reports since being on the Vraylar she has noticed improvement with regards to her mood.  She feels more energetic and motivated and denies irritability.  Her sadness has improved.  She reports she is currently taking a break from school and plans to go back to school in the fall.  She reports she was failing and did not want to try anymore.  She reports that has been a relief since that also has taken a lot of stress off her shoulders.  Her husband has been cleared by neurology and he has started driving again.  That helps her emotionally.  Patient reports sleep is overall okay.  Patient reports appetite is fair.  Denies any binging.  Patient denies any suicidality, homicidality or perceptual disturbances.  Patient is compliant on the Focalin, denies side effects.  Reports attention and focus as  good.  Patient denies any other concerns today.  Visit Diagnosis:    ICD-10-CM   1. Bipolar 1 disorder, depressed, moderate (HCC)  F31.32 cariprazine (VRAYLAR) 1.5 MG capsule    2. GAD (generalized anxiety disorder)  F41.1     3. Attention deficit hyperactivity disorder (ADHD), predominantly inattentive type  F90.0     4. Insomnia due to medical condition  G47.01    frequent urination, mood    5. Binge eating disorder  F50.81       Past Psychiatric History: Reviewed past psychiatric history from progress note on 06/20/2017  Past Medical History:  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   Anemia    Anxiety  state, unspecified    Bipolar 1 disorder, depressed (HCC) 05/21/2017   BIPOLAR AFFECTIVE DISORDER 12/23/2006   Qualifier: Diagnosis of  By: Ermalene Searing MD, Amy     Bipolar affective disorder (HCC) 10/12/2011   Bipolar disorder, unspecified (HCC)    Depressive disorder, not elsewhere classified    Edema    Esophageal reflux    Fibromyalgia    Headache    History of kidney stones    Mild or unspecified pre-eclampsia, unspecified as to episode of care    Morbid obesity (HCC)    Myalgia and myositis, unspecified    Other dyspnea and respiratory abnormality    Preeclampsia    with first pregnancy   Raynaud's disease    Urinary tract infection, site not specified     Past Surgical History:  Procedure Laterality Date   CESAREAN SECTION     X 2   CHOLECYSTECTOMY  03/2006   CYSTOSCOPY W/ URETERAL STENT PLACEMENT Left 05/14/2016   Procedure: CYSTOSCOPY WITH STENT REPLACEMENT;  Surgeon: [name], MD;  Location: [facility];  Service: Urology;  Laterality: Left;   CYSTOSCOPY WITH STENT PLACEMENT Left 04/25/2016   Procedure: CYSTOSCOPY WITH STENT PLACEMENT;  Surgeon: [name], MD;  Location: [facility];  Service: Urology;  Laterality: Left;   DILATION AND CURETTAGE OF UTERUS     INTRAUTERINE DEVICE (IUD) INSERTION     paragard removed & mirena inserted   IVC FILTER INSERTION  03/26/2016   [facility]   LAPAROSCOPIC GASTRIC RESTRICTIVE DUODENAL PROCEDURE (DUODENAL SWITCH)  03/2016   LITHOTRIPSY     TONSILLECTOMY AND ADENOIDECTOMY     URETEROSCOPY WITH HOLMIUM LASER LITHOTRIPSY Left 05/14/2016   Procedure: URETEROSCOPY WITH HOLMIUM LASER LITHOTRIPSY;  Surgeon: [name], MD;  Location: [facility];  Service: Urology;  Laterality: Left;    Family Psychiatric History: Reviewed family psychiatric history from progress note on 06/20/2017  Family History:  Family History  Problem Relation Age of Onset   Depression Mother    Hyperlipidemia Mother    Sleep apnea Mother    Depression  Father    Alcohol abuse Father    Depression Sister    Hyperlipidemia Brother    Depression Brother    Hyperlipidemia Brother    Depression Brother    Alcohol abuse Brother    Lung cancer Maternal Grandfather    Emphysema Maternal Grandfather    Coronary artery disease Maternal Grandfather    Coronary artery disease Maternal Grandmother    Heart attack Maternal Grandmother    Diabetes Maternal Grandmother    Lupus Other        Aunt   Fibromyalgia Other        Aunt   Depression Son    Anxiety disorder Son    ADD / ADHD Son    ADD / ADHD Son     Social History: Reviewed social history from progress note on 06/20/2017 Social History   Socioeconomic History   Marital status: Married    Spouse name: brian   Number of children: 2   Years of education: Not on file   Highest education level: Some college, no degree  Occupational History   Occupation: Arts development officer  Tobacco Use   Smoking status: Never   Smokeless tobacco: Never  Vaping Use   Vaping Use: Never used  Substance and Sexual Activity   Alcohol use: Yes    Alcohol/week: 0.0 standard drinks    Comment: occassional   Drug use: No   Sexual activity: Yes    Partners: Male    Birth control/protection: I.U.D., OCP    Comment: mirena inserted 09-01-18  Other Topics Concern   Not on file  Social History Narrative   Regular exercise-noDiet: no fast food, likes sweets   Social Determinants of Health   Financial Resource Strain: Not on file  Food Insecurity: Not on file  Transportation Needs: Not on file  Physical Activity: Not on file  Stress: Not on file  Social Connections: Not on file    Allergies:  Allergies  Allergen Reactions   Uncaria Tomentosa (Cats Claw) Other (See Comments)    allgeric to cats in general causes sneezing   Bee Pollen Itching   Molds & Smuts Other (See Comments)   Pollen Extract    Milk-Related Compounds     Able to tolerate yogurt; mild intolerance to milk/cheese   Pineapple Rash    Soy Allergy Diarrhea   Strawberry Extract Rash    Break out on tongue noted Break out on tongue noted Break out on tongue noted    Metabolic Disorder Labs: Lab Results  Component Value Date   HGBA1C 4.7 (L) 05/22/2017   MPG 88.19 05/22/2017   Lab Results  Component Value Date   PROLACTIN 7.6 11/07/2018   Lab Results  Component Value Date   CHOL 105 05/22/2017   TRIG 41 05/22/2017   HDL 48 05/22/2017   CHOLHDL 2.2 05/22/2017   VLDL 8 05/22/2017  LDLCALC 49 05/22/2017   LDLCALC 99 09/11/2013   Lab Results  Component Value Date   TSH 0.504 05/21/2018   TSH 0.677 05/22/2017    Therapeutic Level Labs: Lab Results  Component Value Date   LITHIUM 0.17 (L) 06/27/2020   LITHIUM 0.12 (L) 06/01/2020   No results found for: VALPROATE No components found for:  CBMZ  Current Medications: Current Outpatient Medications  Medication Sig Dispense Refill   Biotin 5000 MCG TABS Take 1 tablet by mouth daily.     BLISOVI 24 FE 1-20 MG-MCG(24) tablet TAKE 1 TABLET BY MOUTH EVERY DAY 84 tablet 4   calcium citrate-vitamin D 500-400 MG-UNIT chewable tablet Chew 1 tablet by mouth daily.      cetirizine (ZYRTEC) 10 MG tablet Take 10 mg by mouth daily.     clonazePAM (KLONOPIN) 0.5 MG tablet Take 1 tablet (0.5 mg total) by mouth as directed. Take once every few days for severe panic attacks only. 10 tablet 1   cyclobenzaprine (FLEXERIL) 10 MG tablet Take 10 mg by mouth 2 (two) times daily as needed for muscle spasms.     dexmethylphenidate (FOCALIN XR) 10 MG 24 hr capsule Take 1 capsule (10 mg total) by mouth daily. 30 capsule 0   docusate sodium (COLACE) 100 MG capsule Take 300 mg by mouth daily.     ferrous sulfate 325 (65 FE) MG tablet Take 325 mg by mouth daily with breakfast.     gabapentin (NEURONTIN) 100 MG capsule Take 4 capsules (400 mg total) by mouth at bedtime. 360 capsule 1   hydrOXYzine (ATARAX/VISTARIL) 25 MG tablet Take 1 tablet (25 mg total) by mouth daily as needed for  anxiety. 90 tablet 1   lamoTRIgine (LAMICTAL) 150 MG tablet Take 1 tablet (150 mg total) by mouth daily. Start taking with 25 mg daily 90 tablet 0   lamoTRIgine (LAMICTAL) 25 MG tablet Take 1 tablet (25 mg total) by mouth daily. To be added to 150 mg daily 90 tablet 0   levonorgestrel (MIRENA) 20 MCG/24HR IUD 1 each by Intrauterine route once.     montelukast (SINGULAIR) 10 MG tablet Take 10 mg by mouth daily.     Multiple Vitamin (MULTIVITAMIN) tablet Take 1 tablet by mouth daily.     NIFEdipine (PROCARDIA-XL/ADALAT CC) 60 MG 24 hr tablet Take 60 mg by mouth daily.      nystatin-triamcinolone ointment (MYCOLOG) Apply 1 application topically daily. 30 g 3   omeprazole (PRILOSEC) 20 MG capsule Take 20 mg by mouth daily.     zaleplon (SONATA) 10 MG capsule Take 1 capsule (10 mg total) by mouth at bedtime as needed. 30 capsule 2   cariprazine (VRAYLAR) 1.5 MG capsule Take 1 capsule (1.5 mg total) by mouth daily. 90 capsule 0   No current facility-administered medications for this visit.     Musculoskeletal: Strength & Muscle Tone: within normal limits Gait & Station: normal Patient leans: N/A  Psychiatric Specialty Exam: Review of Systems  Psychiatric/Behavioral:  Positive for dysphoric mood.   All other systems reviewed and are negative.  Blood pressure 134/86, pulse 99, temperature 98.3 F (36.8 C), temperature source Temporal, weight (!) 338 lb 6.4 oz (153.5 kg).Body mass index is 58.09 kg/m.  General Appearance: Casual  Eye Contact:  Fair  Speech:  Clear and Coherent  Volume:  Normal  Mood:  Depressed improving  Affect:  Congruent  Thought Process:  Goal Directed and Descriptions of Associations: Intact  Orientation:  Full (Time, Place, and  Person)  Thought Content: Logical   Suicidal Thoughts:  No  Homicidal Thoughts:  No  Memory:  Immediate;   Fair Recent;   Fair Remote;   Fair  Judgement:  Fair  Insight:  Fair  Psychomotor Activity:  Normal  Concentration:   Concentration: Fair and Attention Span: Fair  Recall:  [employer] of Knowledge: Fair  Language: Fair  Akathisia:  No  Handed:  Right  AIMS (if indicated): done, 0  Assets:  Communication Skills Desire for Improvement Housing Social Support  ADL's:  Intact  Cognition: WNL  Sleep:  Fair   Screenings: AIMS    Flowsheet Row Video Visit from 08/04/2020 in [facility] Video Visit from 03/01/2020 in [facility] Office Visit from 12/11/2017 in [facility] Office Visit from 11/29/2017 in [facility] Office Visit from 11/20/2017 in [facility]  AIMS Total Score 0 8 1 7 5       AUDIT    Flowsheet Row Admission (Discharged) from 05/21/2017 in [facility]  Alcohol Use Disorder Identification Test Final Score (AUDIT) 6      GAD-7    Flowsheet Row Office Visit from 11/14/2020 in [facility]  Total GAD-7 Score 3      PHQ2-9    Flowsheet Row Office Visit from 11/30/2020 in [facility] Office Visit from 11/23/2020 in [facility] Office Visit from 11/14/2020 in [facility] Office Visit from 08/23/2020 in [facility] Office Visit from 08/10/2020 in [facility]  PHQ-2 Total Score 1 3 1 2 5   PHQ-9 Total Score 1 7 7 6 17       Flowsheet Row Office Visit from 11/30/2020 in [facility] Office Visit from 11/23/2020 in [facility] Office Visit from 08/23/2020 in [facility]  C-SSRS RISK CATEGORY No Risk No Risk Error: Q3, 4, or 5 should not be populated when Q2 is No        Assessment and Plan: Sierra [name] is a 40 year old Caucasian female who has a history of bipolar disorder,  anxiety disorder, binge eating disorder was evaluated in office today.  Patient is currently improving on the current medication regimen.  Plan as noted below.  Plan Bipolar disorder type I depressed-improving Vraylar 1.5 mg p.o. daily Lamotrigine 175 mg p.o. daily Gabapentin 400 mg p.o. nightly Sonata 10 mg p.o. nightly as needed  GAD-stable Gabapentin as prescribed Hydroxyzine 25 mg p.o. daily as needed for severe anxiety attacks Klonopin 0.5 mg as needed for severe anxiety attacks only Continue CBT with Ms. Tiffany  Binge eating disorder-stable We will monitor closely Continue CBT  ADHD-improving Focalin extended release 10 mg p.o. daily Reviewed [place] aware.  Follow-up in clinic in 3 to 4 weeks or sooner if needed.  This note was generated in part or whole with voice recognition software. Voice recognition is usually quite accurate but there are transcription errors that can and very often do occur. I apologize for any typographical errors that were not detected and corrected.      [facility], MD 12/01/2020, 9:26 AM

## 2020-12-07 ENCOUNTER — Telehealth: Payer: Self-pay

## 2020-12-07 DIAGNOSIS — F900 Attention-deficit hyperactivity disorder, predominantly inattentive type: Secondary | ICD-10-CM

## 2020-12-07 MED ORDER — DEXMETHYLPHENIDATE HCL ER 15 MG PO CP24: 15.0000 mg | ORAL_CAPSULE | Freq: Every day | ORAL | 0 refills | Status: DC

## 2020-12-07 NOTE — Telephone Encounter (Signed)
pt left a messages that she was wondering if you would increase the focalin and she needs a refills also

## 2020-12-07 NOTE — Telephone Encounter (Signed)
I have increased Focalin to 15 mg daily.  Have sent refills to [facility] in [place].

## 2020-12-22 ENCOUNTER — Ambulatory Visit (INDEPENDENT_AMBULATORY_CARE_PROVIDER_SITE_OTHER): Admitting: Psychiatry

## 2020-12-22 ENCOUNTER — Encounter: Payer: Self-pay | Admitting: Psychiatry

## 2020-12-22 ENCOUNTER — Other Ambulatory Visit: Payer: Self-pay

## 2020-12-22 VITALS — BP 143/85 | HR 97 | Temp 97.8°F | Wt 344.0 lb

## 2020-12-22 DIAGNOSIS — G4701 Insomnia due to medical condition: Secondary | ICD-10-CM | POA: Diagnosis not present

## 2020-12-22 DIAGNOSIS — F3132 Bipolar disorder, current episode depressed, moderate: Secondary | ICD-10-CM | POA: Diagnosis not present

## 2020-12-22 DIAGNOSIS — F900 Attention-deficit hyperactivity disorder, predominantly inattentive type: Secondary | ICD-10-CM

## 2020-12-22 DIAGNOSIS — R030 Elevated blood-pressure reading, without diagnosis of hypertension: Secondary | ICD-10-CM | POA: Insufficient documentation

## 2020-12-22 DIAGNOSIS — F5081 Binge eating disorder: Secondary | ICD-10-CM

## 2020-12-22 DIAGNOSIS — F411 Generalized anxiety disorder: Secondary | ICD-10-CM | POA: Diagnosis not present

## 2020-12-22 DIAGNOSIS — Z79899 Other long term (current) drug therapy: Secondary | ICD-10-CM

## 2020-12-22 MED ORDER — ZALEPLON 10 MG PO CAPS: 10.0000 mg | ORAL_CAPSULE | Freq: Every evening | ORAL | 2 refills | Status: DC | PRN

## 2020-12-22 MED ORDER — CARIPRAZINE HCL 3 MG PO CAPS: 3.0000 mg | ORAL_CAPSULE | Freq: Every day | ORAL | 0 refills | Status: DC

## 2020-12-22 NOTE — Progress Notes (Signed)
BH MD OP Progress Note  12/22/2020 4:19 PM Sierra [name]  MRN:  [id number]  Chief Complaint:  Chief Complaint   Follow-up; Depression; Anxiety; ADD    HPI: Sierra [name] is a 40 year old Caucasian female, married, lives in [place], has a history of bipolar disorder, GAD, ADHD, insomnia, binge eating disorder, history of bariatric surgery was evaluated in office today.  Patient today reports she is currently feeling better with regards to her mood however since it is the winter months, she feels down and depressed when it is gloomy outside.  When it is bright and sunny outside she feels much better.  She is interested in dosage increase of her Leafy Kindle ,wants to try if this will help her with her current mood problems, likely seasonal.  Patient reports attention and focus as good on the Focalin higher dosage.  Patient reports sleep is good.  Patient is currently taking a break from school.  She passed one subject and failed two last semester.  She reports she is trying to communicate with her professors to come up with a plan to help her better.  Patient had a good Thanksgiving holiday with family.  Denies any suicidality, homicidality or perceptual disturbances.  Patient denies any other concerns today.  Visit Diagnosis:    ICD-10-CM   1. Bipolar 1 disorder, depressed, moderate (HCC)  F31.32 cariprazine (VRAYLAR) 3 MG capsule    2. GAD (generalized anxiety disorder)  F41.1 zaleplon (SONATA) 10 MG capsule    3. Attention deficit hyperactivity disorder (ADHD), predominantly inattentive type  F90.0     4. Insomnia due to medical condition  G47.01    frequent urination, mood    5. Binge eating disorder  F50.81     6. High risk medication use  Z79.899 Prolactin    Hemoglobin A1C    Lipid panel    7. Elevated blood pressure reading  R03.0       Past Psychiatric History: Reviewed past psychiatric history from progress note on 06/20/2017.  Past Medical History:  Past  Medical History:  Diagnosis Date   Abdominal pain, other specified site    ADHD    Affective bipolar disorder (HCC) 10/12/2011   Anemia    Anxiety state, unspecified    Bipolar 1 disorder, depressed (HCC) 05/21/2017   BIPOLAR AFFECTIVE DISORDER 12/23/2006   Qualifier: Diagnosis of  By: Ermalene Searing MD, Amy     Bipolar affective disorder (HCC) 10/12/2011   Bipolar disorder, unspecified (HCC)    Depressive disorder, not elsewhere classified    Edema    Esophageal reflux    Fibromyalgia    Headache    History of kidney stones    Mild or unspecified pre-eclampsia, unspecified as to episode of care    Morbid obesity (HCC)    Myalgia and myositis, unspecified    Other dyspnea and respiratory abnormality    Preeclampsia    with first pregnancy   Raynaud's disease    Urinary tract infection, site not specified     Past Surgical History:  Procedure Laterality Date   CESAREAN SECTION     X 2   CHOLECYSTECTOMY  03/2006   CYSTOSCOPY W/ URETERAL STENT PLACEMENT Left 05/14/2016   Procedure: CYSTOSCOPY WITH STENT REPLACEMENT;  Surgeon: [name], MD;  Location: [facility];  Service: Urology;  Laterality: Left;   CYSTOSCOPY WITH STENT PLACEMENT Left 04/25/2016   Procedure: CYSTOSCOPY WITH STENT PLACEMENT;  Surgeon: [name], MD;  Location: [facility];  Service: Urology;  Laterality:  Left;   DILATION AND CURETTAGE OF UTERUS     INTRAUTERINE DEVICE (IUD) INSERTION     paragard removed & mirena inserted   IVC FILTER INSERTION  03/26/2016   [facility]   LAPAROSCOPIC GASTRIC RESTRICTIVE DUODENAL PROCEDURE (DUODENAL SWITCH)  03/2016   LITHOTRIPSY     TONSILLECTOMY AND ADENOIDECTOMY     URETEROSCOPY WITH HOLMIUM LASER LITHOTRIPSY Left 05/14/2016   Procedure: URETEROSCOPY WITH HOLMIUM LASER LITHOTRIPSY;  Surgeon: [name], MD;  Location: [facility];  Service: Urology;  Laterality: Left;    Family Psychiatric History: Reviewed family psychiatric history from progress note on  06/20/2017.  Family History:  Family History  Problem Relation Age of Onset   Depression Mother    Hyperlipidemia Mother    Sleep apnea Mother    Depression Father    Alcohol abuse Father    Depression Sister    Hyperlipidemia Brother    Depression Brother    Hyperlipidemia Brother    Depression Brother    Alcohol abuse Brother    Lung cancer Maternal Grandfather    Emphysema Maternal Grandfather    Coronary artery disease Maternal Grandfather    Coronary artery disease Maternal Grandmother    Heart attack Maternal Grandmother    Diabetes Maternal Grandmother    Lupus Other        Aunt   Fibromyalgia Other        Aunt   Depression Son    Anxiety disorder Son    ADD / ADHD Son    ADD / ADHD Son     Social History: Reviewed social history from progress note on 06/20/2017. Social History   Socioeconomic History   Marital status: Married    Spouse name: brian   Number of children: 2   Years of education: Not on file   Highest education level: Some college, no degree  Occupational History   Occupation: Arts development officer  Tobacco Use   Smoking status: Never   Smokeless tobacco: Never  Vaping Use   Vaping Use: Never used  Substance and Sexual Activity   Alcohol use: Yes    Alcohol/week: 0.0 standard drinks    Comment: occassional   Drug use: No   Sexual activity: Yes    Partners: Male    Birth control/protection: I.U.D., OCP    Comment: mirena inserted 09-01-18  Other Topics Concern   Not on file  Social History Narrative   Regular exercise-noDiet: no fast food, likes sweets   Social Determinants of Health   Financial Resource Strain: Not on file  Food Insecurity: Not on file  Transportation Needs: Not on file  Physical Activity: Not on file  Stress: Not on file  Social Connections: Not on file    Allergies:  Allergies  Allergen Reactions   Uncaria Tomentosa (Cats Claw) Other (See Comments)    allgeric to cats in general causes sneezing   Bee Pollen Itching    Molds & Smuts Other (See Comments)   Pollen Extract    Milk-Related Compounds     Able to tolerate yogurt; mild intolerance to milk/cheese   Pineapple Rash   Soy Allergy Diarrhea   Strawberry Extract Rash    Break out on tongue noted Break out on tongue noted Break out on tongue noted    Metabolic Disorder Labs: Lab Results  Component Value Date   HGBA1C 4.7 (L) 05/22/2017   MPG 88.19 05/22/2017   Lab Results  Component Value Date   PROLACTIN 7.6 11/07/2018  Lab Results  Component Value Date   CHOL 105 05/22/2017   TRIG 41 05/22/2017   HDL 48 05/22/2017   CHOLHDL 2.2 05/22/2017   VLDL 8 05/22/2017   LDLCALC 49 05/22/2017   LDLCALC 99 09/11/2013   Lab Results  Component Value Date   TSH 0.504 05/21/2018   TSH 0.677 05/22/2017    Therapeutic Level Labs: Lab Results  Component Value Date   LITHIUM 0.17 (L) 06/27/2020   LITHIUM 0.12 (L) 06/01/2020   No results found for: VALPROATE No components found for:  CBMZ  Current Medications: Current Outpatient Medications  Medication Sig Dispense Refill   Biotin 5000 MCG TABS Take 1 tablet by mouth daily.     BLISOVI 24 FE 1-20 MG-MCG(24) tablet TAKE 1 TABLET BY MOUTH EVERY DAY 84 tablet 4   calcium citrate-vitamin D 500-400 MG-UNIT chewable tablet Chew 1 tablet by mouth daily.      cariprazine (VRAYLAR) 3 MG capsule Take 1 capsule (3 mg total) by mouth daily. 90 capsule 0   cetirizine (ZYRTEC) 10 MG tablet Take 10 mg by mouth daily.     clonazePAM (KLONOPIN) 0.5 MG tablet Take 1 tablet (0.5 mg total) by mouth as directed. Take once every few days for severe panic attacks only. 10 tablet 1   cyclobenzaprine (FLEXERIL) 10 MG tablet Take 10 mg by mouth 2 (two) times daily as needed for muscle spasms.     dexmethylphenidate (FOCALIN XR) 15 MG 24 hr capsule Take 1 capsule (15 mg total) by mouth daily. 30 capsule 0   docusate sodium (COLACE) 100 MG capsule Take 300 mg by mouth daily.     ferrous sulfate 325 (65 FE) MG  tablet Take 325 mg by mouth daily with breakfast.     gabapentin (NEURONTIN) 100 MG capsule Take 4 capsules (400 mg total) by mouth at bedtime. 360 capsule 1   hydrOXYzine (ATARAX/VISTARIL) 25 MG tablet Take 1 tablet (25 mg total) by mouth daily as needed for anxiety. 90 tablet 1   lamoTRIgine (LAMICTAL) 150 MG tablet Take 1 tablet (150 mg total) by mouth daily. Start taking with 25 mg daily 90 tablet 0   lamoTRIgine (LAMICTAL) 25 MG tablet Take 1 tablet (25 mg total) by mouth daily. To be added to 150 mg daily 90 tablet 0   levonorgestrel (MIRENA) 20 MCG/24HR IUD 1 each by Intrauterine route once.     montelukast (SINGULAIR) 10 MG tablet Take 10 mg by mouth daily.     Multiple Vitamin (MULTIVITAMIN) tablet Take 1 tablet by mouth daily.     NIFEdipine (PROCARDIA-XL/ADALAT CC) 60 MG 24 hr tablet Take 60 mg by mouth daily.      nystatin-triamcinolone ointment (MYCOLOG) Apply 1 application topically daily. 30 g 3   omeprazole (PRILOSEC) 20 MG capsule Take 20 mg by mouth daily.     zaleplon (SONATA) 10 MG capsule Take 1 capsule (10 mg total) by mouth at bedtime as needed. 30 capsule 2   No current facility-administered medications for this visit.     Musculoskeletal: Strength & Muscle Tone: within normal limits Gait & Station: normal Patient leans: N/A  Psychiatric Specialty Exam: Review of Systems  Psychiatric/Behavioral:  Positive for dysphoric mood.   All other systems reviewed and are negative.  Blood pressure (!) 143/85, pulse 97, temperature 97.8 F (36.6 C), temperature source Temporal, weight (!) 344 lb (156 kg).Body mass index is 59.05 kg/m.  General Appearance: Casual  Eye Contact:  Fair  Speech:  Clear and  Coherent  Volume:  Normal  Mood:  Depressed  Affect:  Congruent  Thought Process:  Goal Directed and Descriptions of Associations: Intact  Orientation:  Full (Time, Place, and Person)  Thought Content: Logical   Suicidal Thoughts:  No  Homicidal Thoughts:  No  Memory:   Immediate;   Fair Recent;   Fair Remote;   Fair  Judgement:  Fair  Insight:  Fair  Psychomotor Activity:  Normal  Concentration:  Concentration: Fair and Attention Span: Fair  Recall:  [employer] of Knowledge: Fair  Language: Fair  Akathisia:  No  Handed:  Right  AIMS (if indicated): done, 0  Assets:  Communication Skills Desire for Improvement Housing Social Support  ADL's:  Intact  Cognition: WNL  Sleep:  Fair   Screenings: AIMS    Flowsheet Row Video Visit from 08/04/2020 in [facility] Video Visit from 03/01/2020 in [facility] Office Visit from 12/11/2017 in [facility] Office Visit from 11/29/2017 in [facility] Office Visit from 11/20/2017 in [facility]  AIMS Total Score 0 AUDIT    Flowsheet Row Admission (Discharged) from 05/21/2017 in [facility]  Alcohol Use Disorder Identification Test Final Score (AUDIT) 6      GAD-7    Flowsheet Row Office Visit from 11/14/2020 in [facility]  Total GAD-7 Score 3      PHQ2-9    Flowsheet Row Office Visit from 12/22/2020 in [facility] Office Visit from 11/30/2020 in [facility] Office Visit from 11/23/2020 in [facility] Office Visit from 11/14/2020 in [facility] Office Visit from 08/23/2020 in [facility]  PHQ-2 Total Score 0 PHQ-9 Total Score Flowsheet Row Office Visit from 12/22/2020 in [facility] Office Visit from 11/30/2020 in [facility] Office Visit from 11/23/2020 in [facility]  C-SSRS RISK CATEGORY No Risk No Risk No Risk        Assessment and Plan:  MANAAL MANDALA is a 39 year old Caucasian female who has a history of bipolar disorder, anxiety disorder, binge eating disorder was evaluated in office today.  Patient is currently struggling with depressive symptoms on and off, also was observed as having elevated blood pressure reading in session today.  Discussed plan as noted below.  Plan Bipolar disorder type I depressed-improving Vraylar increased to 3 mg p.o. daily Lamotrigine 175 mg p.o. daily Gabapentin 400 mg p.o. nightly  GAD-stable Gabapentin as prescribed Hydroxyzine 25 mg p.o. daily as needed for severe anxiety attacks Klonopin 0.5 mg as needed for severe anxiety attacks only that she has not been using it Continue CBT with Ms. Tiffany  Binge eating disorder-stable We will monitor closely Continue CBT  Insomnia-stable Sonata 10 mg p.o. nightly as needed  ADHD-improving Focalin extended release 15 mg p.o. daily Reviewed [place] PMP aware  Elevated blood pressure reading-unstable Patient was noted with elevated blood pressure-143/85 on repeat. Patient advised to follow up with primary care provider.  Patient to monitor her blood pressure at home. We will consider reducing the dosage of Focalin if blood pressure remains elevated.  High risk medication use-will order prolactin, hemoglobin A1c, lipid panel.  Provided lab slip.  Follow-up in clinic in 4 weeks or sooner in person.  This note was generated in part or whole with voice recognition software. Voice recognition is usually quite accurate but there are transcription errors that can and very often do occur. I apologize for any typographical errors that were not detected and corrected.       Jomarie Longs, MD 12/22/2020, 4:19 PM

## 2021-01-02 ENCOUNTER — Other Ambulatory Visit: Payer: Self-pay | Admitting: Psychiatry

## 2021-01-02 DIAGNOSIS — F900 Attention-deficit hyperactivity disorder, predominantly inattentive type: Secondary | ICD-10-CM

## 2021-01-31 ENCOUNTER — Ambulatory Visit: Admitting: Psychiatry

## 2021-02-23 ENCOUNTER — Ambulatory Visit: Admitting: Obstetrics & Gynecology

## 2021-02-27 ENCOUNTER — Ambulatory Visit (INDEPENDENT_AMBULATORY_CARE_PROVIDER_SITE_OTHER): Admitting: Obstetrics & Gynecology

## 2021-02-27 ENCOUNTER — Encounter: Payer: Self-pay | Admitting: Obstetrics & Gynecology

## 2021-02-27 ENCOUNTER — Other Ambulatory Visit: Payer: Self-pay

## 2021-02-27 ENCOUNTER — Other Ambulatory Visit (HOSPITAL_COMMUNITY)
Admission: RE | Admit: 2021-02-27 | Discharge: 2021-02-27 | Disposition: A | Discharge status: Home / Self Care | Source: Ambulatory Visit | Attending: Obstetrics & Gynecology | Admitting: Obstetrics & Gynecology

## 2021-02-27 VITALS — BP 128/80 | HR 104 | Resp 20 | Ht 63.0 in | Wt 342.0 lb

## 2021-02-27 DIAGNOSIS — E66813 Obesity, class 3: Secondary | ICD-10-CM

## 2021-02-27 DIAGNOSIS — Z30431 Encounter for routine checking of intrauterine contraceptive device: Secondary | ICD-10-CM

## 2021-02-27 DIAGNOSIS — Z6841 Body Mass Index (BMI) 40.0 and over, adult: Secondary | ICD-10-CM

## 2021-02-27 DIAGNOSIS — Z01419 Encounter for gynecological examination (general) (routine) without abnormal findings: Secondary | ICD-10-CM

## 2021-02-27 DIAGNOSIS — N870 Mild cervical dysplasia: Secondary | ICD-10-CM

## 2021-02-27 DIAGNOSIS — Z113 Encounter for screening for infections with a predominantly sexual mode of transmission: Secondary | ICD-10-CM | POA: Insufficient documentation

## 2021-02-27 DIAGNOSIS — R87810 Cervical high risk human papillomavirus (HPV) DNA test positive: Secondary | ICD-10-CM | POA: Insufficient documentation

## 2021-02-27 MED ORDER — BLISOVI 24 FE 1-20 MG-MCG(24) PO TABS: 1.0000 | ORAL_TABLET | Freq: Every day | ORAL | 4 refills | Status: DC

## 2021-02-27 NOTE — Progress Notes (Signed)
Sierra [name] February 18, 1980 [id number]   History:    41 y.o. G5P2A3L2 Divorced.  Recent break up with Boyfriend since 2019.   RP:  Established patient presenting for annual gyn exam    HPI: Well on Mirena IUD x 08/2018.  No BTB with Blisovi 1/20 Fe.  No pelvic pain.  No pain with IC, but currently abstinent.  Just broke up with boyfriend.  Requesting an STI work up.  H/O CIN 1/HPV HR pos.  Last Pap Neg in 09/2020.  Breasts normal.  Urine/BMs normal. Had Bariatric surgery 03/2016.  BMI increased back to 60.58 this year. Walking daily.  Low carb nutrition.  Health Labs with [facility] NP.  Past medical history,surgical history, family history and social history were all reviewed and documented in the EPIC chart.  Gynecologic History No LMP recorded. (Menstrual status: IUD).  Obstetric History OB History  Gravida Para Term Preterm AB Living  5 2     3 2   SAB IAB Ectopic Multiple Live Births  2 1          # Outcome Date GA Lbr Len/2nd Weight Sex Delivery Anes PTL Lv  5 IAB           4 SAB           3 SAB           2 Para           1 Para              ROS: A ROS was performed and pertinent positives and negatives are included in the history.  GENERAL: No fevers or chills. HEENT: No change in vision, no earache, sore throat or sinus congestion. NECK: No pain or stiffness. CARDIOVASCULAR: No chest pain or pressure. No palpitations. PULMONARY: No shortness of breath, cough or wheeze. GASTROINTESTINAL: No abdominal pain, nausea, vomiting or diarrhea, melena or bright red blood per rectum. GENITOURINARY: No urinary frequency, urgency, hesitancy or dysuria. MUSCULOSKELETAL: No joint or muscle pain, no back pain, no recent trauma. DERMATOLOGIC: No rash, no itching, no lesions. ENDOCRINE: No polyuria, polydipsia, no heat or cold intolerance. No recent change in weight. HEMATOLOGICAL: No anemia or easy bruising or bleeding. NEUROLOGIC: No headache, seizures, numbness, tingling or weakness. PSYCHIATRIC:  No depression, no loss of interest in normal activity or change in sleep pattern.     Exam:   BP 128/80 (BP Location: Right Arm, Patient Position: Sitting)    Pulse (!) 104    Resp 20    Ht 5\' 3"  (1.6 m)    Wt (!) 342 lb (155.1 kg)    SpO2 99%    BMI 60.58 kg/m   Body mass index is 60.58 kg/m.  General appearance : Well developed well nourished female. No acute distress HEENT: Eyes: no retinal hemorrhage or exudates,  Neck supple, trachea midline, no carotid bruits, no thyroidmegaly Lungs: Clear to auscultation, no rhonchi or wheezes, or rib retractions  Heart: Regular rate and rhythm, no murmurs or gallops Breast:Examined in sitting and supine position were symmetrical in appearance, no palpable masses or tenderness,  no skin retraction, no nipple inversion, no nipple discharge, no skin discoloration, no axillary or supraclavicular lymphadenopathy Abdomen: no palpable masses or tenderness, no rebound or guarding Extremities: no edema or skin discoloration or tenderness  Pelvic: Vulva: Normal             Vagina: No gross lesions or discharge  Cervix: No gross lesions or discharge.  IUD  strings visible at the EO.  Pap/HPV HR, Gono-Chlam done.  Uterus  AV, normal size, shape and consistency, non-tender and mobile  Adnexa  Without masses or tenderness  Anus: Normal   Assessment/Plan:  41 y.o. female for annual exam   1. Encounter for routine gynecological examination with Papanicolaou smear of cervix Well on Mirena IUD x 08/2018.  No BTB with Blisovi 1/20 Fe.  No pelvic pain.  No pain with IC, but currently abstinent.  Just broke up with boyfriend.  Requesting an STI work up.  H/O CIN 1/HPV HR pos.  Last Pap Neg in 09/2020.  Pap/HPV HR done.  Breasts normal.  Urine/BMs normal. Had Bariatric surgery 03/2016.  BMI increased back to 60.58 this year. Walking daily.  Low carb nutrition. Health Labs with Fam NP. - Cytology - PAP( [place])  2. Dysplasia of cervix, low grade (CIN 1) -  Cytology - PAP( [place])  3. Cervical high risk HPV (human papillomavirus) test positive HR HPV tested today. - Cytology - PAP( [place])  4. Encounter for routine checking of intrauterine contraceptive device (IUD) Well on Mirena IUD x 08/2018.  IUD in good position.  Using [facility] 1/20 Fe to control vaginal bleeding.  No CI to continue, prescription sent to mail pharmacy.  5. Screen for STD (sexually transmitted disease) - HIV antibody (with reflex) - RPR - Hepatitis B Surface AntiGEN - Hepatitis C Antibody - Cytology - PAP( [place]) with HPV HR and Gono-Chlam  6. Class 3 severe obesity due to excess calories with serious comorbidity and body mass index (BMI) of 60.0 to 69.9 in adult [facility])  Other orders - Norethindrone Acetate-Ethinyl Estrad-FE (BLISOVI 24 FE) 1-20 MG-MCG(24) tablet; Take 1 tablet by mouth daily.   Princess Bruins MD, 10:04 AM 02/27/2021

## 2021-02-28 LAB — HEPATITIS B SURFACE ANTIGEN: Hepatitis B Surface Ag: NONREACTIVE

## 2021-02-28 LAB — RPR: RPR Ser Ql: NONREACTIVE

## 2021-02-28 LAB — HEPATITIS C ANTIBODY
Hepatitis C Ab: NONREACTIVE
SIGNAL TO CUT-OFF: <0.02 (ref <1.00)

## 2021-02-28 LAB — HIV ANTIBODY (ROUTINE TESTING W REFLEX): HIV 1&2 Ab, 4th Generation: NONREACTIVE

## 2021-03-02 LAB — CYTOLOGY - PAP
Chlamydia: NEGATIVE
Comment: NEGATIVE
Comment: NEGATIVE
Comment: NORMAL
Diagnosis: NEGATIVE
Diagnosis: REACTIVE
High risk HPV: NEGATIVE
Neisseria Gonorrhea: NEGATIVE

## 2021-06-14 ENCOUNTER — Telehealth: Payer: Self-pay

## 2021-06-14 MED ORDER — BLISOVI 24 FE 1-20 MG-MCG(24) PO TABS: 1.0000 | ORAL_TABLET | Freq: Every day | ORAL | 0 refills | Status: DC

## 2021-06-14 NOTE — Telephone Encounter (Signed)
AEX 02/27/21.  Patient usually gets her bcp Rx through mail order but had delay in ordering just due to her forgetting to place the order. She placed it last night but it can take two weeks to receive.  She asked if we can send one pack to local pharmacy so she does not have to miss pills while waiting on mail order.  One pack sent to local pharmacy.

## 2021-07-04 ENCOUNTER — Other Ambulatory Visit: Payer: Self-pay | Admitting: Obstetrics & Gynecology

## 2021-08-21 ENCOUNTER — Ambulatory Visit (INDEPENDENT_AMBULATORY_CARE_PROVIDER_SITE_OTHER): Admitting: Obstetrics & Gynecology

## 2021-08-21 ENCOUNTER — Encounter: Payer: Self-pay | Admitting: Obstetrics & Gynecology

## 2021-08-21 VITALS — BP 138/88 | HR 96 | Resp 16 | Ht 64.0 in | Wt 323.0 lb

## 2021-08-21 DIAGNOSIS — Z30431 Encounter for routine checking of intrauterine contraceptive device: Secondary | ICD-10-CM

## 2021-08-21 DIAGNOSIS — R4589 Other symptoms and signs involving emotional state: Secondary | ICD-10-CM | POA: Diagnosis not present

## 2021-08-21 DIAGNOSIS — G4700 Insomnia, unspecified: Secondary | ICD-10-CM | POA: Diagnosis not present

## 2021-08-21 DIAGNOSIS — N921 Excessive and frequent menstruation with irregular cycle: Secondary | ICD-10-CM

## 2021-08-21 NOTE — Progress Notes (Signed)
    Sierra [name] Dec 22, 1980 [id number]        41 y.o.  [id number] Divorced  RP: Irregular bleeding on BCPs and Mirena IUD  HPI: Moodiness with impatience.  Night sweats with insomnia.  On Anti-depressants for depression, no suicidal ideations.  Frequent BTB.  On Mirena IUD x 08/2018 with add back continuous Blisovi 24 1/20 to control the heavy periods.  Lost 20 Lbs in the past month on Mounjaro and increased fitness activities.   OB History  Gravida Para Term Preterm AB Living  5 2     3 2   SAB IAB Ectopic Multiple Live Births  2 1          # Outcome Date GA Lbr Len/2nd Weight Sex Delivery Anes PTL Lv  5 IAB           4 SAB           3 SAB           2 Para           1 Para             Past medical history,surgical history, problem list, medications, allergies, family history and social history were all reviewed and documented in the EPIC chart.   Directed ROS with pertinent positives and negatives documented in the history of present illness/assessment and plan.  Exam:  Vitals:   08/21/21 1520  BP: 138/88  Pulse: 96  Resp: 16  Weight: (!) 323 lb (146.5 kg)  Height: 5\' 4"  (1.626 m)   General appearance:  Normal  Gynecologic exam: Vulva normal.  Speculum (long Graves):  Cervix normal.  IUD strings visible at EO.  Vagina normal.  Normal secretions.  No blood.   Assessment/Plan:  41 y.o.   1. Breakthrough bleeding on birth control pills Moodiness with impatience.  Night sweats with insomnia.  On Anti-depressants for depression, no suicidal ideations.  Frequent BTB.  On Mirena IUD x 08/2018 with add back continuous Blisovi 24 1/20 to control the heavy periods.  Lost 20 Lbs in the past month on Mounjaro and increased fitness activities.  Normal gyn exam.  Will stop the BCPs and f/u for a Pelvic [id number] to further investigate.  Will decide if lab work is indicated if irregular BTB continues. - 09/2018 Transvaginal Non-OB; Future  2. Encounter for routine checking of  intrauterine contraceptive device (IUD) Mirena IUD x 08/2018 in good position.  3. Moodiness Will stop the BCPs to see how she does on her more natural hormonal cycle, just on the Progesterone IUD.  Will remove the IUD at the next visit if needed.  4. Insomnia, unspecified type Will stop the BCPs to see if that improves the night sweats associated with her insomnia.  Other orders - MOUNJARO 5 MG/0.5ML Pen; SMARTSIG:1 SUB-Q Once a Week   [place] MD, 3:30 PM 08/21/2021

## 2021-10-26 ENCOUNTER — Encounter: Payer: Self-pay | Admitting: Obstetrics & Gynecology

## 2021-10-26 ENCOUNTER — Ambulatory Visit (INDEPENDENT_AMBULATORY_CARE_PROVIDER_SITE_OTHER)

## 2021-10-26 ENCOUNTER — Ambulatory Visit (INDEPENDENT_AMBULATORY_CARE_PROVIDER_SITE_OTHER): Admitting: Obstetrics & Gynecology

## 2021-10-26 VITALS — BP 112/82 | HR 104

## 2021-10-26 DIAGNOSIS — N921 Excessive and frequent menstruation with irregular cycle: Secondary | ICD-10-CM

## 2021-10-26 NOTE — Progress Notes (Signed)
    Sierra [name] 09-24-1980 [id number]        41 y.o.  [id number]   RP: BTB on BCPs for Pelvic US  HPI: BTB on BCPs with Blisovi Fe 24 1/20 which she stopped in 08/2021.  Better now on Mirena IUD only, which was inserted in 08/2018.  No pelvic pain.  No vaginal discharge.    OB History  Gravida Para Term Preterm AB Living  5 2 2   3 2   SAB IAB Ectopic Multiple Live Births  2 1          # Outcome Date GA Lbr Len/2nd Weight Sex Delivery Anes PTL Lv  5 IAB           4 SAB           3 SAB           2 Term           1 Term             Past medical history,surgical history, problem list, medications, allergies, family history and social history were all reviewed and documented in the EPIC chart.   Directed ROS with pertinent positives and negatives documented in the history of present illness/assessment and plan.  Exam:  Vitals:   10/26/21 1039  BP: 112/82  Pulse: (!) 104  SpO2: 98%   General appearance:  Normal  Pelvic US today: Comparison is made with previous scan February 2021.  T/V images.  Anteverted uterus normal in size and shape with no myometrial mass.  The uterus is measured at 9.61 x 5.39 x 5.04 cm.  Thin and symmetrical endometrial lining measured at 3.6 mm.  No mass or thickening or abnormal blood flow is seen at the endometrium.  The IUD is noted in proper position within the uterine cavity.  Both ovaries are normal in size with normal follicular pattern and normal perfusion bilaterally.  No adnexal mass.  No free fluid in the pelvis.  No significant change since the previous scan.   Assessment/Plan:  41 y.o. [id number]   1. Breakthrough bleeding on birth control pills BTB on BCPs with Blisovi Fe 24 1/20 which she stopped in 08/2021.  Better now on Mirena IUD only, which was inserted in 08/2018.  No pelvic pain.  No vaginal discharge. Pelvic US findings thoroughly reviewed with patient.  Reassured that her uterus is normal with a thin endometrial line.  IUD in good IU  location.  Normal ovaries.  Decision to observe menstrual pattern on Mirena IUD.  Other orders - buPROPion (WELLBUTRIN XL) 150 MG 24 hr tablet; Take 150 mg by mouth every morning. - VRAYLAR 4.5 MG CAPS; Take 1 capsule by mouth daily. - dexmethylphenidate (FOCALIN XR) 10 MG 24 hr capsule; Take by mouth daily as needed. - MOUNJARO 12.5 MG/0.5ML Pen; Inject into the skin daily. - gabapentin (NEURONTIN) 400 MG capsule; Take 400 mg by mouth daily.   Princess Bruins MD, 3:45 PM 10/26/2021

## 2022-03-01 ENCOUNTER — Ambulatory Visit: Admitting: Obstetrics & Gynecology

## 2022-05-24 ENCOUNTER — Ambulatory Visit
Admission: RE | Admit: 2022-05-24 | Discharge: 2022-05-24 | Disposition: A | Discharge status: Home / Self Care | Source: Ambulatory Visit

## 2022-05-24 VITALS — BP 111/65 | HR 80 | Temp 97.6°F | Resp 18

## 2022-05-24 DIAGNOSIS — H1032 Unspecified acute conjunctivitis, left eye: Secondary | ICD-10-CM | POA: Diagnosis not present

## 2022-05-24 MED ORDER — POLYMYXIN B-TRIMETHOPRIM 10000-0.1 UNIT/ML-% OP SOLN: 1.0000 [drp] | Freq: Four times a day (QID) | OPHTHALMIC | 0 refills | Status: AC

## 2022-05-24 NOTE — ED Provider Notes (Signed)
Sierra [name]    CSN: [id number] Arrival date & time: 05/24/22  1413      History   Chief Complaint Chief Complaint  Patient presents with   Conjunctivitis    Eye draining milky looking fluid. - Entered by patient    HPI Sierra [name] is a 42 y.o. female.  Patient presents with left eye redness, itching, white-yellow drainage, crusting in lashes since this morning.  No eye injury, eye pain, change in vision, fever, chills, congestion, cough, or other symptoms.  No treatment at home.  Her medical history includes hypertension, bipolar disorder, GERD, fibromyalgia, morbid obesity.  The history is provided by the patient and medical records.    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   Anemia    Anxiety state, unspecified    Bipolar 1 disorder, depressed (HCC) 05/21/2017   BIPOLAR AFFECTIVE DISORDER 12/23/2006   Qualifier: Diagnosis of  By: Ermalene Searing MD, Amy     Bipolar affective disorder (HCC) 10/12/2011   Bipolar disorder, unspecified (HCC)    Depressive disorder, not elsewhere classified    Edema    Esophageal reflux    Fibromyalgia    Headache    History of kidney stones    Mild or unspecified pre-eclampsia, unspecified as to episode of care    Morbid obesity (HCC)    Myalgia and myositis, unspecified    Other dyspnea and respiratory abnormality    Preeclampsia    with first pregnancy   Raynaud's disease    Urinary tract infection, site not specified     Patient Active Problem List   Diagnosis Date Noted   Elevated blood pressure reading 12/22/2020   Tachycardia 08/23/2020   Insomnia due to medical condition 08/10/2020   Elevated serum creatinine 08/04/2020   Tremor 03/01/2020   At risk for prolonged QT interval syndrome 02/11/2020   Bipolar disorder, in full remission, most recent episode depressed (HCC) 03/20/2019   Bipolar 1 disorder, depressed, mild (HCC)  02/06/2019   Bipolar disorder, in partial remission, most recent episode mixed (HCC) 01/14/2019   High risk medication use 10/20/2018   Bipolar I disorder, most recent episode mixed (HCC) 07/14/2018   Attention deficit hyperactivity disorder (ADHD), 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   Insomnia due to mental condition 07/14/2018   Major depressive disorder, 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/22/2017   GAD (generalized anxiety disorder) 05/22/2017   Acetaminophen overdose 05/21/2017   Bipolar 1 disorder, depressed, moderate (HCC) 05/21/2017   Left ureteral calculus 04/25/2016   Tingling in extremities 08/29/2015   Acute nonintractable headache 08/29/2015   Visual disturbance 08/23/2015   Polydipsia 03/23/2014   Binge eating disorder 03/23/2014   Vaginitis and vulvovaginitis 12/31/2013   Paronychia 09/21/2013   Essential hypertension, benign 06/05/2013   Bilateral leg pain 05/22/2013   Right carpal tunnel syndrome 09/16/2012   General counseling and advice on female contraception 01/22/2012   Anhydramnios 01/02/2012   Umbilical cord complication 12/07/2011   Single umbilical artery, maternal, antepartum 12/07/2011   High-risk pregnancy 11/09/2011   H/O cesarean section 10/12/2011   Calculus of kidney 10/12/2011   Obesity affecting pregnancy, antepartum 10/12/2011   Bipolar disorder, in partial remission, most recent episode depressed (HCC) 10/12/2011   History of cesarean section 10/12/2011   SNORING 07/23/2007   EDEMA 06/11/2007   Morbid obesity (HCC) 12/23/2006   GERD 12/23/2006   MILD/UNSPEC PRE-ECLAMPSIA UNSPEC AS  EPISODE CARE 12/23/2006   Fibromyalgia 12/23/2006    Past Surgical History:  Procedure Laterality Date   CESAREAN SECTION     X 2   CHOLECYSTECTOMY  03/2006   CYSTOSCOPY W/ URETERAL STENT PLACEMENT Left 05/14/2016   Procedure: CYSTOSCOPY WITH STENT REPLACEMENT;  Surgeon: [name], MD;  Location: [facility];  Service: Urology;   Laterality: Left;   CYSTOSCOPY WITH STENT PLACEMENT Left 04/25/2016   Procedure: CYSTOSCOPY WITH STENT PLACEMENT;  Surgeon: [name], MD;  Location: [facility];  Service: Urology;  Laterality: Left;   DILATION AND CURETTAGE OF UTERUS     INTRAUTERINE DEVICE (IUD) INSERTION     paragard removed & mirena inserted   IVC FILTER INSERTION  03/26/2016   [facility]   LAPAROSCOPIC GASTRIC RESTRICTIVE DUODENAL PROCEDURE (DUODENAL SWITCH)  03/2016   LITHOTRIPSY     TONSILLECTOMY AND ADENOIDECTOMY     URETEROSCOPY WITH HOLMIUM LASER LITHOTRIPSY Left 05/14/2016   Procedure: URETEROSCOPY WITH HOLMIUM LASER LITHOTRIPSY;  Surgeon: [name], MD;  Location: [facility];  Service: Urology;  Laterality: Left;    OB History     Gravida  5   Para  2   Term  2   Preterm      AB  3   Living  2      SAB  2   IAB  1   Ectopic      Multiple      Live Births               Home Medications    Prior to Admission medications   Medication Sig Start Date End Date Taking? Authorizing Provider  JORNAY PM 20 MG CP24 Take 1 capsule by mouth at bedtime. 05/17/22  Yes [provider]  trimethoprim-polymyxin b (POLYTRIM) ophthalmic solution Place 1 drop into both eyes 4 (four) times daily for 7 days. 05/24/22 05/31/22 Yes Mickie Bail, NP  Biotin 5000 MCG TABS Take 1 tablet by mouth daily.    [provider]  buPROPion (WELLBUTRIN XL) 150 MG 24 hr tablet Take 150 mg by mouth every morning. 09/18/21   [provider]  calcium citrate-vitamin D 500-400 MG-UNIT chewable tablet Chew 1 tablet by mouth daily.     [provider]  cetirizine (ZYRTEC) 10 MG tablet Take 10 mg by mouth daily.    [provider]  clonazePAM (KLONOPIN) 0.5 MG tablet Take 1 tablet (0.5 mg total) by mouth as directed. Take once every few days for severe panic attacks only. 09/23/20   Jomarie Longs, MD  cyclobenzaprine (FLEXERIL) 10 MG tablet Take 10 mg by mouth 2 (two) times daily  as needed for muscle spasms.    [provider]  dexmethylphenidate (FOCALIN XR) 10 MG 24 hr capsule Take by mouth daily as needed. Patient not taking: Reported on 05/24/2022 10/18/21   [provider]  dexmethylphenidate (FOCALIN XR) 15 MG 24 hr capsule Take 1 capsule (15 mg total) by mouth daily. Patient not taking: Reported on 05/24/2022 12/07/20   Jomarie Longs, MD  docusate sodium (COLACE) 100 MG capsule Take 300 mg by mouth daily.    [provider]  ferrous sulfate 325 (65 FE) MG tablet Take 325 mg by mouth daily with breakfast.    [provider]  gabapentin (NEURONTIN) 100 MG capsule Take 4 capsules (400 mg total) by mouth at bedtime. Patient taking differently: Take 200 mg by mouth at bedtime. 09/08/20   Jomarie Longs, MD  gabapentin (NEURONTIN)  400 MG capsule Take 400 mg by mouth daily.    [provider]  hydrOXYzine (ATARAX/VISTARIL) 25 MG tablet Take 1 tablet (25 mg total) by mouth daily as needed for anxiety. 05/12/20   Jomarie Longs, MD  lamoTRIgine (LAMICTAL) 150 MG tablet Take 1 tablet (150 mg total) by mouth daily. Start taking with 25 mg daily 11/14/20   Jomarie Longs, MD  lamoTRIgine (LAMICTAL) 25 MG tablet Take 1 tablet (25 mg total) by mouth daily. To be added to 150 mg daily 11/14/20   Jomarie Longs, MD  levonorgestrel (MIRENA) 20 MCG/24HR IUD 1 each by Intrauterine route once.    [provider]  montelukast (SINGULAIR) 10 MG tablet Take 10 mg by mouth daily.    [provider]  MOUNJARO 12.5 MG/0.5ML Pen Inject into the skin daily. Patient not taking: Reported on 05/24/2022 10/14/21   [provider]  Multiple Vitamin (MULTIVITAMIN) tablet Take 1 tablet by mouth daily.    [provider]  NIFEdipine (PROCARDIA-XL/ADALAT CC) 60 MG 24 hr tablet Take 60 mg by mouth daily.     [provider]  nystatin-triamcinolone ointment (MYCOLOG) Apply 1 application topically daily. Patient not  taking: Reported on 05/24/2022 09/28/20   Genia Del, MD  omeprazole (PRILOSEC) 20 MG capsule Take 20 mg by mouth daily.    [provider]  VRAYLAR 4.5 MG CAPS Take 1 capsule by mouth daily. 09/06/21   [provider]    Family History Family History  Problem Relation Age of Onset   Depression Mother    Hyperlipidemia Mother    Sleep apnea Mother    Depression Father    Alcohol abuse Father    Depression Sister    Hyperlipidemia Brother    Depression Brother    Hyperlipidemia Brother    Depression Brother    Alcohol abuse Brother    Lung cancer Maternal Grandfather    Emphysema Maternal Grandfather    Coronary artery disease Maternal Grandfather    Coronary artery disease Maternal Grandmother    Heart attack Maternal Grandmother    Diabetes Maternal Grandmother    Lupus Other        Aunt   Fibromyalgia Other        Aunt   Depression Son    Anxiety disorder Son    ADD / ADHD Son    ADD / ADHD Son     Social History Social History   Tobacco Use   Smoking status: Never   Smokeless tobacco: Never  Vaping Use   Vaping Use: Never used  Substance Use Topics   Alcohol use: Not Currently    Comment: occassional   Drug use: No     Allergies   Uncaria tomentosa (cats claw), Bee pollen, Molds & smuts, Pollen extract, Milk-related compounds, Pineapple, Soy allergy, and Strawberry extract   Review of Systems Review of Systems  Constitutional:  Negative for chills and fever.  HENT:  Negative for congestion, ear pain and sore throat.   Eyes:  Positive for discharge, redness and itching. Negative for pain and visual disturbance.  Respiratory:  Negative for cough and shortness of breath.   Cardiovascular:  Negative for chest pain and palpitations.     Physical Exam Triage Vital Signs ED Triage Vitals  Enc Vitals Group     BP --      Pulse Rate 05/24/22 1442 80     Resp 05/24/22 1442 18     Temp 05/24/22 1442 97.6 F (36.4  C)     Temp src  --      SpO2 05/24/22 1442 98 %     Weight --      Height --      Head Circumference --      Peak Flow --      Pain Score 05/24/22 1443 0     Pain Loc --      Pain Edu? --      Excl. in GC? --    No data found.  Updated Vital Signs BP 111/65   Pulse 80   Temp 97.6 F (36.4 C)   Resp 18   SpO2 98%   Visual Acuity Right Eye Distance:   Left Eye Distance:   Bilateral Distance:    Right Eye Near:   Left Eye Near:    Bilateral Near:     Physical Exam Vitals and nursing note reviewed.  Constitutional:      General: She is not in acute distress.    Appearance: She is well-developed. She is not ill-appearing.  HENT:     Head: Normocephalic and atraumatic.     Right Ear: Tympanic membrane normal.     Left Ear: Tympanic membrane normal.     Nose: Nose normal.     Mouth/Throat:     Mouth: Mucous membranes are moist.     Pharynx: Oropharynx is clear.  Eyes:     General: Lids are normal. Vision grossly intact.     Extraocular Movements: Extraocular movements intact.     Conjunctiva/sclera:     Left eye: Left conjunctiva is injected.     Pupils: Pupils are equal, round, and reactive to light.     Comments: White-yellow drainage in left inner canthus.   Cardiovascular:     Rate and Rhythm: Normal rate and regular rhythm.     Heart sounds: Normal heart sounds.  Pulmonary:     Effort: Pulmonary effort is normal. No respiratory distress.     Breath sounds: Normal breath sounds.  Musculoskeletal:     Cervical back: Neck supple.  Skin:    General: Skin is warm and dry.  Neurological:     Mental Status: She is alert.  Psychiatric:        Mood and Affect: Mood normal.        Behavior: Behavior normal.      UC Treatments / Results  Labs (all labs ordered are listed, but only abnormal results are displayed) Labs Reviewed - No data to display  EKG   Radiology No results found.  Procedures Procedures (including critical care time)  Medications Ordered in  UC Medications - No data to display  Initial Impression / Assessment and Plan / UC Course  I have reviewed the triage vital signs and the nursing notes.  Pertinent labs & imaging results that were available during my care of the patient were reviewed by me and considered in my medical decision making (see chart for details).    Left eye conjunctivitis.  Treating with Polytrim eyedrops.  Education provided on bacterial conjunctivitis.  Instructed patient to follow-up with her PCP if her symptoms are not improving.  ED precautions discussed.  She agrees to plan of care.   Final Clinical Impressions(s) / UC Diagnoses   Final diagnoses:  Acute bacterial conjunctivitis of left eye     Discharge Instructions      Use the antibiotic eyedrops as prescribed.    Follow-up with your primary care provider if your symptoms  are not improving.    Go to the emergency department if you have acute eye pain, changes in your vision, or other concerning symptoms.        ED Prescriptions     Medication Sig Dispense Auth. Provider   trimethoprim-polymyxin b (POLYTRIM) ophthalmic solution Place 1 drop into both eyes 4 (four) times daily for 7 days. 10 mL Mickie Bail, NP      PDMP not reviewed this encounter.   Mickie Bail, NP 05/24/22 [phone]

## 2022-05-24 NOTE — Discharge Instructions (Addendum)
Use the antibiotic eyedrops as prescribed.    Follow-up with your primary care provider if your symptoms are not improving.    Go to the emergency department if you have acute eye pain, changes in your vision, or other concerning symptoms.    

## 2022-05-24 NOTE — ED Triage Notes (Signed)
Patient to Urgent Care with complaints of left sided eye drainage, describes "milky fluid". Eye redness and irritation.  Symptoms started today.

## 2022-06-05 ENCOUNTER — Ambulatory Visit: Admitting: Obstetrics & Gynecology

## 2022-09-06 ENCOUNTER — Ambulatory Visit (INDEPENDENT_AMBULATORY_CARE_PROVIDER_SITE_OTHER): Admitting: Gastroenterology

## 2022-09-06 ENCOUNTER — Encounter: Payer: Self-pay | Admitting: Gastroenterology

## 2022-09-06 VITALS — BP 129/76 | HR 101 | Temp 98.4°F | Ht 64.0 in | Wt 332.0 lb

## 2022-09-06 DIAGNOSIS — R195 Other fecal abnormalities: Secondary | ICD-10-CM | POA: Diagnosis not present

## 2022-09-06 DIAGNOSIS — R109 Unspecified abdominal pain: Secondary | ICD-10-CM

## 2022-09-06 DIAGNOSIS — R197 Diarrhea, unspecified: Secondary | ICD-10-CM

## 2022-09-06 DIAGNOSIS — R1084 Generalized abdominal pain: Secondary | ICD-10-CM

## 2022-09-06 DIAGNOSIS — K591 Functional diarrhea: Secondary | ICD-10-CM

## 2022-09-06 MED ORDER — DICYCLOMINE HCL 10 MG PO CAPS: 10.0000 mg | ORAL_CAPSULE | Freq: Three times a day (TID) | ORAL | 2 refills | Status: DC

## 2022-09-06 NOTE — Progress Notes (Signed)
Gastroenterology Consultation  Referring Provider:     Armando Gang, FNP Primary Care Physician:  Armando Gang, FNP Primary Gastroenterologist:  Dr. Servando Snare     Reason for Consultation:     Diarrhea        HPI:   Sierra [name] is a 42 y.o. y/o female referred for consultation & management of diarrhea by Dr. Clint Guy, Fernand Parkins, FNP.  This patient comes in today with multiple GI issues.  The patient states that when she does not eat she gets severe abdominal pain and will then have diarrhea with mucus in her stools.  The patient had a duodenal switch bariatric surgery.  The patient also reports that she sees whole pills in her stools and she has been having issues with her medication for her bipolar disorder.  She also reports that when she takes her medication with food it appears not to work but will only work when she takes it on empty stomach which is a problem since when she does not eat she gets the abdominal pain and diarrhea. The patient had a weight at her most recent visit of 323 pounds. There is no report of any nausea vomiting black stools or bloody stools.  The patient also reports frequent issues with abdominal cramps. She reports that after taking LPG 1 medications for weight loss that is when she noticed that her medication was not working unless she took it on an empty stomach.  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   Anemia    Anxiety state, unspecified    Bipolar 1 disorder, depressed (HCC) 05/21/2017   BIPOLAR AFFECTIVE DISORDER 12/23/2006   Qualifier: Diagnosis of  By: Ermalene Searing MD, Amy     Bipolar affective disorder (HCC) 10/12/2011   Bipolar disorder, unspecified (HCC)    Depressive disorder, not elsewhere classified    Edema    Esophageal reflux    Fibromyalgia    Headache    History of kidney stones    Mild or unspecified pre-eclampsia, unspecified as to  episode of care    Morbid obesity (HCC)    Myalgia and myositis, unspecified    Other dyspnea and respiratory abnormality    Preeclampsia    with first pregnancy   Raynaud's disease    Urinary tract infection, site not specified     Past Surgical History:  Procedure Laterality Date   CESAREAN SECTION     X 2   CHOLECYSTECTOMY  03/2006   CYSTOSCOPY W/ URETERAL STENT PLACEMENT Left 05/14/2016   Procedure: CYSTOSCOPY WITH STENT REPLACEMENT;  Surgeon: [name], MD;  Location: [facility];  Service: Urology;  Laterality: Left;   CYSTOSCOPY WITH STENT PLACEMENT Left 04/25/2016   Procedure: CYSTOSCOPY WITH STENT PLACEMENT;  Surgeon: [name], MD;  Location: [facility];  Service: Urology;  Laterality: Left;   DILATION AND CURETTAGE OF UTERUS     INTRAUTERINE DEVICE (IUD) INSERTION     paragard removed & mirena inserted   IVC FILTER INSERTION  03/26/2016   [facility]   LAPAROSCOPIC GASTRIC RESTRICTIVE DUODENAL PROCEDURE (DUODENAL SWITCH)  03/2016   LITHOTRIPSY     TONSILLECTOMY AND ADENOIDECTOMY     URETEROSCOPY WITH HOLMIUM LASER LITHOTRIPSY Left 05/14/2016   Procedure: URETEROSCOPY WITH HOLMIUM LASER LITHOTRIPSY;  Surgeon: [name], MD;  Location: [facility];  Service: Urology;  Laterality: Left;    Prior to  Admission medications   Medication Sig Start Date End Date Taking? Authorizing Provider  Biotin 5000 MCG TABS Take 1 tablet by mouth daily.   Yes [provider]  buPROPion (WELLBUTRIN XL) 150 MG 24 hr tablet Take 150 mg by mouth every morning. 09/18/21  Yes [provider]  calcium citrate-vitamin D 500-400 MG-UNIT chewable tablet Chew 1 tablet by mouth daily.    Yes [provider]  cetirizine (ZYRTEC) 10 MG tablet Take 10 mg by mouth daily.   Yes [provider]  clonazePAM (KLONOPIN) 0.5 MG tablet Take 1 tablet (0.5 mg total) by mouth as directed. Take once every few days for severe panic attacks only. 09/23/20  Yes Jomarie Longs,  MD  cyclobenzaprine (FLEXERIL) 10 MG tablet Take 10 mg by mouth 2 (two) times daily as needed for muscle spasms.   Yes [provider]  dexmethylphenidate (FOCALIN XR) 15 MG 24 hr capsule Take 1 capsule (15 mg total) by mouth daily. 12/07/20  Yes Jomarie Longs, MD  docusate sodium (COLACE) 100 MG capsule Take 300 mg by mouth daily.   Yes [provider]  hydrOXYzine (ATARAX/VISTARIL) 25 MG tablet Take 1 tablet (25 mg total) by mouth daily as needed for anxiety. 05/12/20  Yes Jomarie Longs, MD  lamoTRIgine (LAMICTAL) 25 MG tablet Take 1 tablet (25 mg total) by mouth daily. To be added to 150 mg daily 11/14/20  Yes Eappen, Levin Bacon, MD  levonorgestrel (MIRENA) 20 MCG/24HR IUD 1 each by Intrauterine route once.   Yes [provider]  montelukast (SINGULAIR) 10 MG tablet Take 10 mg by mouth daily.   Yes [provider]  Multiple Vitamin (MULTIVITAMIN) tablet Take 1 tablet by mouth daily.   Yes [provider]  NIFEdipine (PROCARDIA-XL/ADALAT CC) 60 MG 24 hr tablet Take 60 mg by mouth daily.    Yes [provider]  nystatin-triamcinolone ointment (MYCOLOG) Apply 1 application topically daily. 09/28/20  Yes Genia Del, MD  omeprazole (PRILOSEC) 20 MG capsule Take 20 mg by mouth daily.   Yes [provider]  VRAYLAR 4.5 MG CAPS Take 1 capsule by mouth daily. 09/06/21  Yes [provider]    Family History  Problem Relation Age of Onset   Depression Mother    Hyperlipidemia Mother    Sleep apnea Mother    Depression Father    Alcohol abuse Father    Depression Sister    Hyperlipidemia Brother    Depression Brother    Hyperlipidemia Brother    Depression Brother    Alcohol abuse Brother    Lung cancer Maternal Grandfather    Emphysema Maternal Grandfather    Coronary artery disease Maternal Grandfather    Coronary artery disease Maternal Grandmother    Heart attack Maternal Grandmother    Diabetes Maternal  Grandmother    Lupus Other        Aunt   Fibromyalgia Other        Aunt   Depression Son    Anxiety disorder Son    ADD / ADHD Son    ADD / ADHD Son      Social History   Tobacco Use   Smoking status: Never   Smokeless tobacco: Never  Vaping Use   Vaping status: Never Used  Substance Use Topics   Alcohol use: Not Currently    Comment: occassional   Drug use: No    Allergies as of 09/06/2022 - Review Complete 09/06/2022  Allergen Reaction Noted   Uncaria tomentosa (  cats claw) Other (See Comments) 09/03/2016   Bee pollen Itching 09/03/2016   Molds & smuts Other (See Comments) 09/03/2016   Pollen extract  09/03/2016   Milk-related compounds  08/27/2016   Pineapple Rash 08/27/2016   Soy allergy Diarrhea 08/27/2016   Strawberry extract Rash 08/27/2016    Review of Systems:    All systems reviewed and negative except where noted in HPI.   Physical Exam:  There were no vitals taken for this visit. No LMP recorded. (Menstrual status: IUD). General:   Alert,  Well-developed, well-nourished, pleasant and cooperative in NAD Head:  Normocephalic and atraumatic. Eyes:  Sclera clear, no icterus.   Conjunctiva pink. Ears:  Normal auditory acuity. Neurologic:  Alert and oriented x3;  grossly normal neurologically. Skin:  Intact without significant lesions or rashes.  No jaundice. Psych:  Alert and cooperative. Normal mood and affect.  Imaging Studies: No results found.  Assessment and Plan:   Sierra [name] is a 42 y.o. y/o female who comes in with what sounds like irritable bowel syndrome reporting mucus with her stools and diarrhea that is associated with not eating and with stress associated with not eating.  The patient also states that her psychiatric pills are coming out whole.  The patient thinks she may not be absorbing them.  The patient has been told that she should talk to her psychiatrist and ask if there are any preparations of the medication that can be taken  to help better be absorbed such as if she can crush the tablets or if there is a liquid form.  The patient has also been told about a low FODMAP diet and she will ask her psychiatrist about a low-dose tricyclic for irritable bowel syndrome and her cramps.  The patient will also be started on dicyclomine 10 mg 3 times a day for her abdominal cramps.  The patient has been explained the plan agrees with it.    Midge Minium, MD. Clementeen Graham    Note: This dictation was prepared with Dragon dictation along with smaller phrase technology. Any transcriptional errors that result from this process are unintentional.

## 2022-09-18 ENCOUNTER — Other Ambulatory Visit: Payer: Self-pay

## 2022-09-18 ENCOUNTER — Emergency Department
Admission: EM | Admit: 2022-09-18 | Discharge: 2022-09-18 | Disposition: A | Discharge status: Home / Self Care | Attending: Emergency Medicine | Admitting: Emergency Medicine

## 2022-09-18 ENCOUNTER — Encounter: Payer: Self-pay | Admitting: Emergency Medicine

## 2022-09-18 DIAGNOSIS — Z1152 Encounter for screening for COVID-19: Secondary | ICD-10-CM | POA: Insufficient documentation

## 2022-09-18 DIAGNOSIS — N390 Urinary tract infection, site not specified: Secondary | ICD-10-CM | POA: Insufficient documentation

## 2022-09-18 DIAGNOSIS — R300 Dysuria: Secondary | ICD-10-CM | POA: Diagnosis present

## 2022-09-18 LAB — RESP PANEL BY RT-PCR (RSV, FLU A&B, COVID)  RVPGX2
Influenza A by PCR: NEGATIVE
Influenza B by PCR: NEGATIVE
Resp Syncytial Virus by PCR: NEGATIVE
SARS Coronavirus 2 by RT PCR: NEGATIVE

## 2022-09-18 LAB — URINALYSIS, ROUTINE W REFLEX MICROSCOPIC
Bilirubin Urine: NEGATIVE
Glucose, UA: NEGATIVE mg/dL
Ketones, ur: NEGATIVE mg/dL
Nitrite: NEGATIVE
Protein, ur: 30 mg/dL — AB
RBC / HPF: >50 RBC/hpf (ref 0–5)
Specific Gravity, Urine: 1.023 (ref 1.005–1.030)
WBC, UA: >50 WBC/hpf (ref 0–5)
pH: 6 (ref 5.0–8.0)

## 2022-09-18 MED ORDER — CEPHALEXIN 500 MG PO CAPS
500.0000 mg | ORAL_CAPSULE | Freq: Once | ORAL | Status: AC
  Administered 2022-09-18: 500 mg via ORAL
  Filled 2022-09-18: qty 1

## 2022-09-18 MED ORDER — CEPHALEXIN 500 MG PO CAPS: 500.0000 mg | ORAL_CAPSULE | Freq: Three times a day (TID) | ORAL | 0 refills | Status: AC

## 2022-09-18 NOTE — ED Provider Notes (Signed)
[facility] Provider Note    Event Date/Time   First MD Initiated Contact with Patient 09/18/22 1326     (approximate)   History   Fever   HPI  Sierra [name] is a 42 y.o. female with a history of bipolar disorder, fibromyalgia, history of kidney stones, urinary tract infections who presents with complaints of dysuria.  Patient reports 2 days ago she developed chills, and had a fever, that is resolved but she reports she then developed mild dysuria.     Physical Exam   Triage Vital Signs: ED Triage Vitals  Encounter Vitals Group     BP 09/18/22 1314 (!) 143/71     Systolic BP Percentile --      Diastolic BP Percentile --      Pulse Rate 09/18/22 1314 (!) 111     Resp 09/18/22 1314 18     Temp 09/18/22 1314 98.3 F (36.8 C)     Temp Source 09/18/22 1314 Oral     SpO2 09/18/22 1314 96 %     Weight 09/18/22 1315 (!) 149.7 kg (330 lb)     Height 09/18/22 1315 1.626 m (5\' 4" )     Head Circumference --      Peak Flow --      Pain Score 09/18/22 1315 5     Pain Loc --      Pain Education --      Exclude from Growth Chart --     Most recent vital signs: Vitals:   09/18/22 1314 09/18/22 1405  BP: (!) 143/71 130/73  Pulse: (!) 111 100  Resp: 18 18  Temp: 98.3 F (36.8 C)   SpO2: 96% 96%     General: Awake, no distress.  CV:  Good peripheral perfusion.  Resp:  Normal effort.  Abd:  No distention.  Soft, nontender Other:     ED Results / Procedures / Treatments   Labs (all labs ordered are listed, but only abnormal results are displayed) Labs Reviewed  RESP PANEL BY RT-PCR (RSV, FLU A&B, COVID)  [id number]  URINE CULTURE  URINALYSIS, ROUTINE W REFLEX MICROSCOPIC     EKG     RADIOLOGY     PROCEDURES:  Critical Care performed:   Procedures   MEDICATIONS ORDERED IN ED: Medications  cephALEXin (KEFLEX) capsule 500 mg (has no administration in time range)     IMPRESSION / MDM / ASSESSMENT AND PLAN / ED COURSE  I  reviewed the triage vital signs and the nursing notes. Patient's presentation is most consistent with acute illness / injury with system symptoms.  Patient presents with chills, fever, myalgias and some dysuria  Differential includes viral illness, UTI, pyelonephritis.  Not consistent with kidney stone  Overall well-appearing here and in no acute distress, afebrile, mild tachycardia initially  No significant CVA tenderness.  COVID swab negative, pending urinalysis  ----------------------------------------- 3:13 PM on 09/18/2022 ----------------------------------------- Patient well appearing, feeling well currently on reeval  Will treat for UTI with keflex, culture sent      FINAL CLINICAL IMPRESSION(S) / ED DIAGNOSES   Final diagnoses:  Lower urinary tract infectious disease     Rx / DC Orders   ED Discharge Orders          Ordered    cephALEXin (KEFLEX) 500 MG capsule  3 times daily        09/18/22 1513             Note:  This document  was prepared using Conservation officer, historic buildings and may include unintentional dictation errors.   Jene Every, MD 09/18/22 [phone]

## 2022-09-18 NOTE — ED Triage Notes (Signed)
Patient to ED via POV for fever, chills, body aches, and back pain. Ongoing since Friday.

## 2022-09-21 LAB — URINE CULTURE: Culture: >=100000 — AB

## 2022-10-01 LAB — LAB REPORT - SCANNED
A1c: 5.1
EGFR: 73

## 2022-10-30 ENCOUNTER — Ambulatory Visit

## 2022-11-01 ENCOUNTER — Ambulatory Visit (INDEPENDENT_AMBULATORY_CARE_PROVIDER_SITE_OTHER): Admitting: Family Medicine

## 2022-11-01 VITALS — BP 120/81 | HR 88 | Ht 64.0 in | Wt 323.0 lb

## 2022-11-01 DIAGNOSIS — R600 Localized edema: Secondary | ICD-10-CM

## 2022-11-01 DIAGNOSIS — Z6841 Body Mass Index (BMI) 40.0 and over, adult: Secondary | ICD-10-CM

## 2022-11-01 DIAGNOSIS — R7989 Other specified abnormal findings of blood chemistry: Secondary | ICD-10-CM

## 2022-11-01 DIAGNOSIS — E66813 Obesity, class 3: Secondary | ICD-10-CM

## 2022-11-01 DIAGNOSIS — Z7689 Persons encountering health services in other specified circumstances: Secondary | ICD-10-CM

## 2022-11-01 DIAGNOSIS — K219 Gastro-esophageal reflux disease without esophagitis: Secondary | ICD-10-CM

## 2022-11-01 DIAGNOSIS — E0590 Thyrotoxicosis, unspecified without thyrotoxic crisis or storm: Secondary | ICD-10-CM

## 2022-11-01 NOTE — Patient Instructions (Signed)
 MyChart:  For all urgent or time sensitive needs we ask that you please call the office to avoid delays. Our number is [phone]. MyChart is not constantly monitored and due to the large volume of messages a day, replies may take up to 72 business hours.   MyChart Policy: MyChart allows for you to see your visit notes, after visit summary, provider recommendations, lab and tests results, make an appointment, request refills, and contact your provider or the office for non-urgent questions or concerns. Providers are seeing patients during normal business hours and do not have built in time to review MyChart messages.  We ask that you allow a minimum of 3 business days for responses to [employer]. For this reason, please do not send urgent requests through MyChart. Please call the office at [phone]. New and ongoing conditions may require a visit. We have virtual and in person visit available for your convenience.  Complex MyChart concerns may require a visit. Your provider may request you schedule a virtual or in person visit to ensure we are providing the best care possible. MyChart messages sent after 11:00 AM on Friday will not be received by the provider until Monday morning.    Lab and Test Results: You will receive your lab and test results on MyChart as soon as they are completed and results have been sent by the lab or testing facility. Due to this service, you will receive your results BEFORE your provider.  I review lab and tests results each morning prior to seeing patients. Some results require collaboration with other providers to ensure you are receiving the most appropriate care. For this reason, we ask that you please allow a minimum of 3-5 business days from the time the ALL results have been received for your provider to receive and review lab and test results and contact you about these.  Most lab and test result comments from the provider will be sent through MyChart.  Your provider may recommend changes to the plan of care, follow-up visits, repeat testing, ask questions, or request an office visit to discuss these results. You may reply directly to this message or call the office at [phone] to provide information for the provider or set up an appointment. In some instances, you will be called with test results and recommendations. Please let us know if this is preferred and we will make note of this in your chart to provide this for you.    If you have not heard a response to your lab or test results in 5 business days from all results returning to MyChart, please call the office to let us know. We ask that you please avoid calling prior to this time unless there is an emergent concern. Due to high call volumes, this can delay the resulting process.   After Hours: For all non-emergency after hours needs, please call the office at [phone] and select the option to reach the on-call provider service. On-call services are shared between multiple [place] offices and therefore it will not be possible to speak directly with your provider. On-call providers may provide medical advice and recommendations, but are unable to provide refills for maintenance medications.  For all emergency or urgent medical needs after normal business hours, we recommend that you seek care at the closest Urgent Care or Emergency Department to ensure appropriate treatment in a timely manner.  [facility] has a 24 hour emergency room located on the ground floor for your  convenience.    Urgent Concerns During the Business Day Providers are seeing patients from 8AM to 5PM, Monday through Thursday, and 8AM to 12PM on Friday with a busy schedule and are most often not able to respond to non-urgent calls until the end of the day or the next business day. If you should have URGENT concerns during the day, please call and speak to the nurse or schedule a same day  appointment so that we can address your concern without delay.    Thank you, again, for choosing me as your health care partner. I appreciate your trust and look forward to learning more about you.    Alyson Reedy, FNP-C

## 2022-11-01 NOTE — Progress Notes (Signed)
New Patient Office Visit  Subjective    Patient ID: Sierra [name], female    DOB: 1980-04-26  Age: 42 y.o. MRN: [id number]  CC:  Chief Complaint  Patient presents with   Leg Swelling   Gastroesophageal Reflux   Night Sweats    HPI Sierra [name] is a 42 year old female who presents to establish with Primary Care & Sports Medicine at [facility]. She has noticed these symptoms for about 4 months. She felt like it started with insomnia.  Former PCP: Franco Nones, FNP at [facility] in [place], [place]   Acid reflux- despite being on PPI Her diet is very limited and still experiences burning sensation & cough nightly  Cough started about 2-3 months ago, feels like she "burps flames"  Lower extremity edema- denies erythema, warmth, calf pain  Elevation does help, wears compression socks  Not very active  Days when she is on her feet does notice more swelling  She does have varicose veins She is currently taking nifedipine for Raynaud's syndrome   Patient had her labs done and was informed by her prior PCP that there were no concerns.  Noted all abnormal labs:  Platelet 494 H HDL 47 L  Hemoglobin A1c-- 5.1  TSH 0.230 L T3 52.3 H T4 16.5 H Testosterone 0.60 L Vitamin B12 >1501 H Vitamin D 29.6 L  Outpatient Encounter Medications as of 11/01/2022  Medication Sig   amitriptyline (ELAVIL) 50 MG tablet Take 50 mg by mouth at bedtime.   amphetamine-dextroamphetamine (ADDERALL XR) 20 MG 24 hr capsule Take 20 mg by mouth daily.   Biotin 96295 MCG TABS Take by mouth.   buPROPion (WELLBUTRIN XL) 150 MG 24 hr tablet Take 150 mg by mouth every morning.   Cariprazine HCl (VRAYLAR) 6 MG CAPS Take 6 mg by mouth daily.   cetirizine (ZYRTEC) 10 MG tablet Take 10 mg by mouth daily.   cyclobenzaprine (FLEXERIL) 10 MG tablet Take 10 mg by mouth 2 (two) times daily as needed for muscle spasms.   dicyclomine (BENTYL) 10 MG capsule Take 1 capsule (10 mg  total) by mouth 4 (four) times daily -  before meals and at bedtime.   docusate sodium (COLACE) 100 MG capsule Take 200 mg by mouth daily.   DOXYCYCLINE HYCLATE PO Take 100 mg by mouth daily.   Ferrous Sulfate Dried (SLOW RELEASE IRON) 45 MG TBCR Take 45 mg by mouth daily.   gabapentin (NEURONTIN) 600 MG tablet Take 600 mg by mouth 3 (three) times daily.   hydrOXYzine (ATARAX) 50 MG tablet Take 50 mg by mouth 3 (three) times daily as needed for anxiety or itching.   lamoTRIgine (LAMICTAL) 100 MG tablet Take 100 mg by mouth daily.   lamoTRIgine (LAMICTAL) 25 MG tablet Take 25 mg by mouth daily.   levonorgestrel (MIRENA) 20 MCG/24HR IUD 1 each by Intrauterine route once.   montelukast (SINGULAIR) 10 MG tablet Take 10 mg by mouth daily.   Multiple Vitamin (MULTIVITAMIN) tablet Take 1 tablet by mouth daily.   NIFEdipine (PROCARDIA XL/NIFEDICAL-XL) 90 MG 24 hr tablet Take 90 mg by mouth daily.   omeprazole (PRILOSEC) 40 MG capsule Take 40 mg by mouth daily.   QELBREE 200 MG 24 hr capsule Take 200 mg by mouth daily.   Specialty Vitamins Products (BIOTIN PLUS KERATIN) 10000-100 MCG-MG TABS Take by mouth.   topiramate (TOPAMAX) 100 MG tablet Take 100 mg by mouth daily.   [DISCONTINUED] amitriptyline (ELAVIL) 25 MG tablet  Take 50 mg by mouth at bedtime. (Patient not taking: Reported on 11/01/2022)   [DISCONTINUED] amphetamine-dextroamphetamine (ADDERALL XR) 10 MG 24 hr capsule Take 10 mg by mouth daily. (Patient not taking: Reported on 11/01/2022)   [DISCONTINUED] Biotin 5000 MCG TABS Take 1 tablet by mouth daily. (Patient not taking: Reported on 11/01/2022)   [DISCONTINUED] calcium citrate-vitamin D 500-400 MG-UNIT chewable tablet Chew 1 tablet by mouth daily.  (Patient not taking: Reported on 11/01/2022)   [DISCONTINUED] clonazePAM (KLONOPIN) 0.5 MG tablet Take 1 tablet (0.5 mg total) by mouth as directed. Take once every few days for severe panic attacks only. (Patient not taking: Reported on  11/01/2022)   [DISCONTINUED] docusate sodium (COLACE) 100 MG capsule Take 300 mg by mouth daily. (Patient not taking: Reported on 11/01/2022)   [DISCONTINUED] hydrOXYzine (ATARAX/VISTARIL) 25 MG tablet Take 1 tablet (25 mg total) by mouth daily as needed for anxiety. (Patient not taking: Reported on 11/01/2022)   [DISCONTINUED] lamoTRIgine (LAMICTAL) 25 MG tablet Take 1 tablet (25 mg total) by mouth daily. To be added to 150 mg daily (Patient not taking: Reported on 11/01/2022)   [DISCONTINUED] NIFEdipine (PROCARDIA-XL/ADALAT CC) 60 MG 24 hr tablet Take 60 mg by mouth daily.  (Patient not taking: Reported on 11/01/2022)   [DISCONTINUED] omeprazole (PRILOSEC) 20 MG capsule Take 20 mg by mouth daily. (Patient not taking: Reported on 11/01/2022)   No facility-administered encounter medications on file as of 11/01/2022.    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   Allergy    Anemia    Anxiety state, unspecified    Bipolar 1 disorder, depressed (HCC) 05/21/2017   BIPOLAR AFFECTIVE DISORDER 12/23/2006   Qualifier: Diagnosis of  By: Ermalene Searing MD, Amy     Bipolar affective disorder (HCC) 10/12/2011   Bipolar disorder, unspecified (HCC)    Depressive disorder, not elsewhere classified    Edema    Esophageal reflux    Fibromyalgia    Headache    Heart murmur    History of kidney stones    Mild or unspecified pre-eclampsia, unspecified as to episode of care    Morbid obesity (HCC)    Myalgia and myositis, unspecified    Other dyspnea and respiratory abnormality    Preeclampsia    with first pregnancy   Raynaud's disease    Urinary tract infection, site not specified     Past Surgical History:  Procedure Laterality Date   CESAREAN SECTION     X 2   CHOLECYSTECTOMY  03/2006   CYSTOSCOPY W/ URETERAL STENT PLACEMENT Left 05/14/2016   Procedure: CYSTOSCOPY WITH STENT REPLACEMENT;  Surgeon: [name], MD;  Location: [facility] OR[facility];  Service: Urology;  Laterality: Left;   CYSTOSCOPY WITH STENT PLACEMENT Left 04/25/2016   Procedure: CYSTOSCOPY WITH STENT PLACEMENT;  Surgeon: [name], MD;  Location: [facility];  Service: Urology;  Laterality: Left;   DILATION AND CURETTAGE OF UTERUS     HERNIA REPAIR     INTRAUTERINE DEVICE (IUD) INSERTION     paragard removed & mirena inserted   IVC FILTER INSERTION  03/26/2016   [facility]   LAPAROSCOPIC GASTRIC RESTRICTIVE DUODENAL PROCEDURE (DUODENAL SWITCH)  03/2016   LITHOTRIPSY     SMALL INTESTINE SURGERY     TONSILLECTOMY AND ADENOIDECTOMY     URETEROSCOPY WITH HOLMIUM LASER LITHOTRIPSY Left 05/14/2016   Procedure: URETEROSCOPY WITH HOLMIUM LASER LITHOTRIPSY;  Surgeon: Morrie Sheldon  Apolinar Junes, MD;  Location: [facility] OR[facility];  Service: Urology;  Laterality: Left;    Family History  Problem Relation Age of Onset   Depression Mother    Hyperlipidemia Mother    Sleep apnea Mother    Asthma Mother    Hypertension Mother    Depression Father    Alcohol abuse Father    Early death Father    Depression Sister    Hyperlipidemia Brother    Depression Brother    Hyperlipidemia Brother    Depression Brother    Alcohol abuse Brother    Lung cancer Maternal Grandfather    Emphysema Maternal Grandfather    Coronary artery disease Maternal Grandfather    Alcohol abuse Maternal Grandfather    Cancer Maternal Grandfather    COPD Maternal Grandfather    Hypertension Maternal Grandfather    Coronary artery disease Maternal Grandmother    Heart attack Maternal Grandmother    Diabetes Maternal Grandmother    Alcohol abuse Maternal Grandmother    Heart disease Maternal Grandmother    Hypertension Maternal Grandmother    Obesity Maternal Grandmother    Lupus Other        Aunt   Fibromyalgia Other        Aunt   Depression Son    Anxiety disorder Son    ADD / ADHD Son    ADD / ADHD Son      Review of Systems  Constitutional:  Negative for  malaise/fatigue.  HENT:  Negative for congestion and tinnitus.   Eyes:  Negative for blurred vision and double vision.  Respiratory:  Positive for cough. Negative for shortness of breath.   Cardiovascular:  Positive for leg swelling. Negative for chest pain and palpitations.  Gastrointestinal:  Positive for heartburn. Negative for abdominal pain, nausea and vomiting.  Musculoskeletal:  Negative for myalgias.  Neurological:  Negative for dizziness, weakness and headaches.  Psychiatric/Behavioral:  Negative for depression and suicidal ideas. The patient has insomnia. The patient is not nervous/anxious.       Objective    BP 120/81   Pulse 88   Ht 5\' 4"  (1.626 m)   Wt (!) 323 lb (146.5 kg)   SpO2 100%   BMI 55.44 kg/m   Physical Exam Vitals reviewed.  Constitutional:      Appearance: Normal appearance. She is obese.  Cardiovascular:     Rate and Rhythm: Normal rate and regular rhythm.     Pulses: Normal pulses.     Heart sounds: Normal heart sounds.  Pulmonary:     Effort: Pulmonary effort is normal.     Breath sounds: Normal breath sounds.  Musculoskeletal:     Right lower leg: Edema (non-pitting) present.     Left lower leg: Edema (non-pitting) present.  Neurological:     Mental Status: She is alert.  Psychiatric:        Mood and Affect: Mood normal.        Behavior: Behavior normal.      Assessment & Plan:   1. Encounter to establish care Patient is a 42 year old female who presents today to establish care with primary care at [facility]. Reviewed the past medical history, family history, social history, surgical history, medications and allergies today- updates made as indicated. She would like to discuss abnormal labwork she had completed in September 2024.   2. Class 3 severe obesity due to excess calories without serious comorbidity with body mass index (BMI) of 50.0 to 59.9 in adult [facility]) Patient's  current BMI 55.44. Discussed importance of lifestyle  modifications including healthy nutrition and daily exercise. Weight loss would benefit patient's dyspnea on exertion.   3. Gastroesophageal reflux disease, unspecified whether esophagitis present Patient has a history of GERD and reports that she has been experiencing burning sensation in her upper esophagus- despite taking omeprazole 40mg  daily. Discussed referral to GI and patient is agreeable.  - Ambulatory referral to Gastroenterology  4. Elevated platelet count Review of past labs, with noted elevated platelet count. Will repeat CBC today.  - CBC with Differential/Platelet  5. Bilateral lower extremity edema Patient reports experiencing shortness of breath with exertion, but not more than her normal, and lower extremity edema. She reports that she notices non-pitting lower extremity edema after being on her feet for an extended period of time. Denies chest pain, vision changes, headaches, palpitations. Cardiovascular exam with heart regular rate and rhythm. Normal heart sounds, no murmurs present. No lower extremity edema present. Lungs clear to auscultation bilaterally. Will obtain electrolytes, kidney function and BNP today. Most recent echo done 11/11/2020 with EF 60-65%. Discussed use of furosemide if normal electrolytes and kidney function to help improve swelling. - Basic Metabolic Panel (BMET) - B Nat Peptide  6. Hyperthyroidism Review of recent labs with decreased TSH and elevated free T4 and T3. She reports she has been dealing with night sweats, swelling and irritability for the past couple of months. Plan to recheck thyroid function today.   Return in about 6 weeks (around 12/13/2022) for Physical.   Spent 45 minutes on this patient encounter, including preparation, chart review, face-to-face counseling with patient and coordination of care, and documentation of encounter.    Alyson Reedy, FNP

## 2022-11-02 LAB — BRAIN NATRIURETIC PEPTIDE: BNP: 31.5 pg/mL (ref 0.0–100.0)

## 2022-11-02 LAB — BASIC METABOLIC PANEL
BUN/Creatinine Ratio: 10 (ref 9–23)
BUN: 12 mg/dL (ref 6–24)
CO2: 20 mmol/L (ref 20–29)
Calcium: 9.6 mg/dL (ref 8.7–10.2)
Chloride: 103 mmol/L (ref 96–106)
Creatinine, Ser: 1.24 mg/dL — ABNORMAL HIGH (ref 0.57–1.00)
Glucose: 93 mg/dL (ref 70–99)
Potassium: 4.8 mmol/L (ref 3.5–5.2)
Sodium: 138 mmol/L (ref 134–144)
eGFR: 56 mL/min/{1.73_m2} — ABNORMAL LOW (ref >59)

## 2022-11-02 LAB — CBC WITH DIFFERENTIAL/PLATELET
Basophils Absolute: 0.1 10*3/uL (ref 0.0–0.2)
Basos: 1 %
EOS (ABSOLUTE): 0.2 10*3/uL (ref 0.0–0.4)
Eos: 2 %
Hematocrit: 39.6 % (ref 34.0–46.6)
Hemoglobin: 12.9 g/dL (ref 11.1–15.9)
Immature Grans (Abs): 0 10*3/uL (ref 0.0–0.1)
Immature Granulocytes: 0 %
Lymphocytes Absolute: 1.5 10*3/uL (ref 0.7–3.1)
Lymphs: 19 %
MCH: 29.3 pg (ref 26.6–33.0)
MCHC: 32.6 g/dL (ref 31.5–35.7)
MCV: 90 fL (ref 79–97)
Monocytes Absolute: 0.7 10*3/uL (ref 0.1–0.9)
Monocytes: 9 %
Neutrophils Absolute: 5.6 10*3/uL (ref 1.4–7.0)
Neutrophils: 69 %
Platelets: 455 10*3/uL — ABNORMAL HIGH (ref 150–450)
RBC: 4.4 x10E6/uL (ref 3.77–5.28)
RDW: 12.9 % (ref 11.7–15.4)
WBC: 8.1 10*3/uL (ref 3.4–10.8)

## 2022-11-06 ENCOUNTER — Encounter (HOSPITAL_BASED_OUTPATIENT_CLINIC_OR_DEPARTMENT_OTHER): Payer: Self-pay | Admitting: Family Medicine

## 2022-11-07 LAB — TSH+FREE T4
Free T4: 1.01 ng/dL (ref 0.82–1.77)
TSH: 0.611 u[IU]/mL (ref 0.450–4.500)

## 2022-11-07 LAB — SPECIMEN STATUS REPORT

## 2022-11-23 ENCOUNTER — Other Ambulatory Visit: Payer: Self-pay | Admitting: Medical Genetics

## 2022-11-23 DIAGNOSIS — Z006 Encounter for examination for normal comparison and control in clinical research program: Secondary | ICD-10-CM

## 2022-11-30 ENCOUNTER — Other Ambulatory Visit
Admission: RE | Admit: 2022-11-30 | Discharge: 2022-11-30 | Disposition: A | Discharge status: Home / Self Care | Source: Ambulatory Visit | Attending: Medical Genetics | Admitting: Medical Genetics

## 2022-11-30 DIAGNOSIS — Z006 Encounter for examination for normal comparison and control in clinical research program: Secondary | ICD-10-CM | POA: Insufficient documentation

## 2022-12-03 ENCOUNTER — Other Ambulatory Visit (HOSPITAL_BASED_OUTPATIENT_CLINIC_OR_DEPARTMENT_OTHER)

## 2022-12-03 ENCOUNTER — Other Ambulatory Visit (HOSPITAL_BASED_OUTPATIENT_CLINIC_OR_DEPARTMENT_OTHER): Payer: Self-pay

## 2022-12-03 DIAGNOSIS — R600 Localized edema: Secondary | ICD-10-CM

## 2022-12-03 DIAGNOSIS — R7989 Other specified abnormal findings of blood chemistry: Secondary | ICD-10-CM

## 2022-12-03 LAB — CBC WITH DIFFERENTIAL/PLATELET
Basophils Absolute: 0.1 10*3/uL (ref 0.0–0.2)
Basos: 1 %
EOS (ABSOLUTE): 0.1 10*3/uL (ref 0.0–0.4)
Eos: 2 %
Hematocrit: 42.8 % (ref 34.0–46.6)
Hemoglobin: 13.9 g/dL (ref 11.1–15.9)
Immature Grans (Abs): 0 10*3/uL (ref 0.0–0.1)
Immature Granulocytes: 0 %
Lymphocytes Absolute: 1.3 10*3/uL (ref 0.7–3.1)
Lymphs: 26 %
MCH: 29.5 pg (ref 26.6–33.0)
MCHC: 32.5 g/dL (ref 31.5–35.7)
MCV: 91 fL (ref 79–97)
Monocytes Absolute: 0.4 10*3/uL (ref 0.1–0.9)
Monocytes: 8 %
Neutrophils Absolute: 3.1 10*3/uL (ref 1.4–7.0)
Neutrophils: 63 %
Platelets: 389 10*3/uL (ref 150–450)
RBC: 4.71 x10E6/uL (ref 3.77–5.28)
RDW: 14.3 % (ref 11.7–15.4)
WBC: 4.9 10*3/uL (ref 3.4–10.8)

## 2022-12-03 LAB — BASIC METABOLIC PANEL
BUN/Creatinine Ratio: 10 (ref 9–23)
BUN: 13 mg/dL (ref 6–24)
CO2: 19 mmol/L — ABNORMAL LOW (ref 20–29)
Calcium: 9.6 mg/dL (ref 8.7–10.2)
Chloride: 110 mmol/L — ABNORMAL HIGH (ref 96–106)
Creatinine, Ser: 1.25 mg/dL — ABNORMAL HIGH (ref 0.57–1.00)
Glucose: 94 mg/dL (ref 70–99)
Potassium: 4.5 mmol/L (ref 3.5–5.2)
Sodium: 144 mmol/L (ref 134–144)
eGFR: 56 mL/min/{1.73_m2} — ABNORMAL LOW (ref >59)

## 2022-12-06 ENCOUNTER — Encounter (HOSPITAL_BASED_OUTPATIENT_CLINIC_OR_DEPARTMENT_OTHER): Payer: Self-pay | Admitting: Family Medicine

## 2022-12-11 LAB — GENECONNECT MOLECULAR SCREEN: Genetic Analysis Overall Interpretation: NEGATIVE

## 2022-12-17 ENCOUNTER — Encounter (HOSPITAL_BASED_OUTPATIENT_CLINIC_OR_DEPARTMENT_OTHER): Admitting: Family Medicine

## 2022-12-18 ENCOUNTER — Encounter (HOSPITAL_BASED_OUTPATIENT_CLINIC_OR_DEPARTMENT_OTHER): Admitting: Family Medicine

## 2023-02-04 ENCOUNTER — Encounter: Payer: Self-pay | Admitting: Family Medicine

## 2023-02-07 ENCOUNTER — Telehealth: Admitting: Family Medicine

## 2023-02-07 DIAGNOSIS — H10021 Other mucopurulent conjunctivitis, right eye: Secondary | ICD-10-CM | POA: Diagnosis not present

## 2023-02-07 MED ORDER — POLYMYXIN B-TRIMETHOPRIM 10000-0.1 UNIT/ML-% OP SOLN: 1.0000 [drp] | Freq: Four times a day (QID) | OPHTHALMIC | 0 refills | Status: DC

## 2023-02-07 NOTE — Patient Instructions (Signed)
Sierra [name], thank you for joining Sierra Finner, NP for today's virtual visit.  While this provider is not your primary care provider (PCP), if your PCP is located in our provider database this encounter information will be shared with them immediately following your visit.   A [facility] MyChart account gives you access to today's visit and all your visits, tests, and labs performed at [facility] " click here if you don't have a [facility] MyChart account or go to [email or web address]  Consent: (Patient) Sierra [name] provided verbal consent for this virtual visit at the beginning of the encounter.  Current Medications:  Current Outpatient Medications:    trimethoprim-polymyxin b (POLYTRIM) ophthalmic solution, Place 1 drop into the right eye in the morning, at noon, in the evening, and at bedtime., Disp: 10 mL, Rfl: 0   amitriptyline (ELAVIL) 50 MG tablet, Take 50 mg by mouth at bedtime., Disp: , Rfl:    amphetamine-dextroamphetamine (ADDERALL XR) 20 MG 24 hr capsule, Take 20 mg by mouth daily., Disp: , Rfl:    Biotin 78295 MCG TABS, Take by mouth., Disp: , Rfl:    buPROPion (WELLBUTRIN XL) 150 MG 24 hr tablet, Take 150 mg by mouth every morning., Disp: , Rfl:    Cariprazine HCl (VRAYLAR) 6 MG CAPS, Take 6 mg by mouth daily., Disp: , Rfl:    cetirizine (ZYRTEC) 10 MG tablet, Take 10 mg by mouth daily., Disp: , Rfl:    cyclobenzaprine (FLEXERIL) 10 MG tablet, Take 10 mg by mouth 2 (two) times daily as needed for muscle spasms., Disp: , Rfl:    dicyclomine (BENTYL) 10 MG capsule, Take 1 capsule (10 mg total) by mouth 4 (four) times daily -  before meals and at bedtime., Disp: 90 capsule, Rfl: 2   docusate sodium (COLACE) 100 MG capsule, Take 200 mg by mouth daily., Disp: , Rfl:    DOXYCYCLINE HYCLATE PO, Take 100 mg by mouth daily., Disp: , Rfl:    Ferrous Sulfate Dried (SLOW RELEASE IRON) 45 MG TBCR, Take 45 mg by mouth daily., Disp: , Rfl:    gabapentin  (NEURONTIN) 600 MG tablet, Take 600 mg by mouth 3 (three) times daily., Disp: , Rfl:    hydrOXYzine (ATARAX) 50 MG tablet, Take 50 mg by mouth 3 (three) times daily as needed for anxiety or itching., Disp: , Rfl:    lamoTRIgine (LAMICTAL) 100 MG tablet, Take 100 mg by mouth daily., Disp: , Rfl:    lamoTRIgine (LAMICTAL) 25 MG tablet, Take 25 mg by mouth daily., Disp: , Rfl:    levonorgestrel (MIRENA) 20 MCG/24HR IUD, 1 each by Intrauterine route once., Disp: , Rfl:    montelukast (SINGULAIR) 10 MG tablet, Take 10 mg by mouth daily., Disp: , Rfl:    Multiple Vitamin (MULTIVITAMIN) tablet, Take 1 tablet by mouth daily., Disp: , Rfl:    NIFEdipine (PROCARDIA XL/NIFEDICAL-XL) 90 MG 24 hr tablet, Take 90 mg by mouth daily., Disp: , Rfl:    omeprazole (PRILOSEC) 40 MG capsule, Take 40 mg by mouth daily., Disp: , Rfl:    QELBREE 200 MG 24 hr capsule, Take 200 mg by mouth daily., Disp: , Rfl:    Specialty Vitamins Products (BIOTIN PLUS KERATIN) 10000-100 MCG-MG TABS, Take by mouth., Disp: , Rfl:    topiramate (TOPAMAX) 100 MG tablet, Take 100 mg by mouth daily., Disp: , Rfl:    Medications ordered in this encounter:  Meds ordered this encounter  Medications   trimethoprim-polymyxin b (  POLYTRIM) ophthalmic solution    Sig: Place 1 drop into the right eye in the morning, at noon, in the evening, and at bedtime.    Dispense:  10 mL    Refill:  0    Supervising Provider:   Merrilee Jansky [[id number]]     *If you need refills on other medications prior to your next appointment, please contact your pharmacy*  Follow-Up: Call back or seek an in-person evaluation if the symptoms worsen or if the condition fails to improve as anticipated.  [facility] [phone]  Other Instructions  You have been diagnosed with an eye infection. You will be treated with the ordered medication, please take and use as directed.  -keep eyes clean and pat dry (you can use baby soap to wash them) -warm  compresses -avoid rubbing -change pillowcases -toss make up out that has been recently used -avoid contacts or products in or on the eye until it has improved.  If you develop vision changes, pain with eye movement or changes or worsening in condition please go be seen in person.     If you have been instructed to have an in-person evaluation today at a local Urgent Care facility, please use the link below. It will take you to a list of all of our available [facility] [facility], including address, phone number and hours of operation. Please do not delay care.  [facility]  If you or a family member do not have a primary care provider, use the link below to schedule a visit and establish care. When you choose a [facility] primary care physician or advanced practice provider, you gain a long-term partner in health. Find a Primary Care Provider  Learn more about [facility] in-office and virtual care options: [place] - Get Care Now

## 2023-02-07 NOTE — Progress Notes (Signed)
Virtual Visit Consent   Sierra [name], you are scheduled for a virtual visit with a [facility] provider today. Just as with appointments in the office, your consent must be obtained to participate. Your consent will be active for this visit and any virtual visit you may have with one of our providers in the next 365 days. If you have a MyChart account, a copy of this consent can be sent to you electronically.  As this is a virtual visit, video technology does not allow for your provider to perform a traditional examination. This may limit your provider's ability to fully assess your condition. If your provider identifies any concerns that need to be evaluated in person or the need to arrange testing (such as labs, EKG, etc.), we will make arrangements to do so. Although advances in technology are sophisticated, we cannot ensure that it will always work on either your end or our end. If the connection with a video visit is poor, the visit may have to be switched to a telephone visit. With either a video or telephone visit, we are not always able to ensure that we have a secure connection.  By engaging in this virtual visit, you consent to the provision of healthcare and authorize for your insurance to be billed (if applicable) for the services provided during this visit. Depending on your insurance coverage, you may receive a charge related to this service.  I need to obtain your verbal consent now. Are you willing to proceed with your visit today? [name] has provided verbal consent on 02/07/2023 for a virtual visit (video or telephone). Freddy Finner, NP  Date: 02/07/2023 9:44 AM  Virtual Visit via Video Note   I, Freddy Finner, connected with  [name]  ([id number], [birth year]) on 02/07/23 at  9:45 AM EST by a video-enabled telemedicine application and verified that I am speaking with the correct person using two identifiers.  Location: Patient: Virtual Visit Location  Patient: Home Provider: Virtual Visit Location Provider: Home Office   I discussed the limitations of evaluation and management by telemedicine and the availability of in person appointments. The patient expressed understanding and agreed to proceed.    History of Present Illness: Sierra [name] is a 43 y.o. who identifies as a female who was assigned female at birth, and is being seen today for pink eye  Onset was yesterday with some itching, crusting overnight Associated symptoms are drainage, and watery, redness starting  Modifying factors are warm compresses and keeping it clean, hand washing  Denies chest pain, shortness of breath, fevers, chills, vision changes, or pain with eye movement.    Problems:  Patient Active Problem List   Diagnosis Date Noted   Elevated blood pressure reading 12/22/2020   Tachycardia 08/23/2020   Insomnia due to medical condition 08/10/2020   Elevated serum creatinine 08/04/2020   Tremor 03/01/2020   At risk for prolonged QT interval syndrome 02/11/2020   Bipolar disorder, in full remission, most recent episode depressed (HCC) 03/20/2019   Bipolar 1 disorder, depressed, mild (HCC) 02/06/2019   Bipolar disorder, in partial remission, most recent episode mixed (HCC) 01/14/2019   High risk medication use 10/20/2018   Bipolar I disorder, most recent episode mixed (HCC) 07/14/2018   Attention deficit hyperactivity disorder (ADHD), 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   Insomnia due to mental condition 07/14/2018   Major depressive disorder, 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/22/2017   GAD (generalized anxiety disorder) 05/22/2017  Acetaminophen overdose 05/21/2017   Bipolar 1 disorder, depressed, moderate (HCC) 05/21/2017   Left ureteral calculus 04/25/2016   Tingling in extremities 08/29/2015   Acute nonintractable headache 08/29/2015   Visual disturbance 08/23/2015   Polydipsia 03/23/2014   Binge eating disorder 03/23/2014    Vaginitis and vulvovaginitis 12/31/2013   Paronychia 09/21/2013   Essential hypertension, benign 06/05/2013   Bilateral leg pain 05/22/2013   Right carpal tunnel syndrome 09/16/2012   General counseling and advice on female contraception 01/22/2012   Anhydramnios 01/02/2012   Umbilical cord complication 12/07/2011   Single umbilical artery, maternal, antepartum 12/07/2011   High-risk pregnancy 11/09/2011   H/O cesarean section 10/12/2011   Calculus of kidney 10/12/2011   Obesity affecting pregnancy, antepartum 10/12/2011   Bipolar disorder, in partial remission, most recent episode depressed (HCC) 10/12/2011   History of cesarean section 10/12/2011   SNORING 07/23/2007   EDEMA 06/11/2007   Morbid obesity (HCC) 12/23/2006   GERD 12/23/2006   MILD/UNSPEC PRE-ECLAMPSIA UNSPEC AS EPISODE CARE 12/23/2006   Fibromyalgia 12/23/2006    Allergies:  Allergies  Allergen Reactions   Uncaria Tomentosa (Cats Claw) Other (See Comments)    allgeric to cats in general causes sneezing   Bee Pollen Itching   Dog Epithelium Other (See Comments)    Stuffy nose   Molds & Smuts Other (See Comments)   Pollen Extract    Milk-Related Compounds     Able to tolerate yogurt; mild intolerance to milk/cheese   Pineapple Rash   Soy Allergy (Do Not Select) Diarrhea   Strawberry Extract Rash    Break out on tongue noted Break out on tongue noted Break out on tongue noted   Medications:  Current Outpatient Medications:    amitriptyline (ELAVIL) 50 MG tablet, Take 50 mg by mouth at bedtime., Disp: , Rfl:    amphetamine-dextroamphetamine (ADDERALL XR) 20 MG 24 hr capsule, Take 20 mg by mouth daily., Disp: , Rfl:    Biotin 84696 MCG TABS, Take by mouth., Disp: , Rfl:    buPROPion (WELLBUTRIN XL) 150 MG 24 hr tablet, Take 150 mg by mouth every morning., Disp: , Rfl:    Cariprazine HCl (VRAYLAR) 6 MG CAPS, Take 6 mg by mouth daily., Disp: , Rfl:    cetirizine (ZYRTEC) 10 MG tablet, Take 10 mg by mouth  daily., Disp: , Rfl:    cyclobenzaprine (FLEXERIL) 10 MG tablet, Take 10 mg by mouth 2 (two) times daily as needed for muscle spasms., Disp: , Rfl:    dicyclomine (BENTYL) 10 MG capsule, Take 1 capsule (10 mg total) by mouth 4 (four) times daily -  before meals and at bedtime., Disp: 90 capsule, Rfl: 2   docusate sodium (COLACE) 100 MG capsule, Take 200 mg by mouth daily., Disp: , Rfl:    DOXYCYCLINE HYCLATE PO, Take 100 mg by mouth daily., Disp: , Rfl:    Ferrous Sulfate Dried (SLOW RELEASE IRON) 45 MG TBCR, Take 45 mg by mouth daily., Disp: , Rfl:    gabapentin (NEURONTIN) 600 MG tablet, Take 600 mg by mouth 3 (three) times daily., Disp: , Rfl:    hydrOXYzine (ATARAX) 50 MG tablet, Take 50 mg by mouth 3 (three) times daily as needed for anxiety or itching., Disp: , Rfl:    lamoTRIgine (LAMICTAL) 100 MG tablet, Take 100 mg by mouth daily., Disp: , Rfl:    lamoTRIgine (LAMICTAL) 25 MG tablet, Take 25 mg by mouth daily., Disp: , Rfl:    levonorgestrel (MIRENA) 20 MCG/24HR IUD,  1 each by Intrauterine route once., Disp: , Rfl:    montelukast (SINGULAIR) 10 MG tablet, Take 10 mg by mouth daily., Disp: , Rfl:    Multiple Vitamin (MULTIVITAMIN) tablet, Take 1 tablet by mouth daily., Disp: , Rfl:    NIFEdipine (PROCARDIA XL/NIFEDICAL-XL) 90 MG 24 hr tablet, Take 90 mg by mouth daily., Disp: , Rfl:    omeprazole (PRILOSEC) 40 MG capsule, Take 40 mg by mouth daily., Disp: , Rfl:    QELBREE 200 MG 24 hr capsule, Take 200 mg by mouth daily., Disp: , Rfl:    Specialty Vitamins Products (BIOTIN PLUS KERATIN) 10000-100 MCG-MG TABS, Take by mouth., Disp: , Rfl:    topiramate (TOPAMAX) 100 MG tablet, Take 100 mg by mouth daily., Disp: , Rfl:   Observations/Objective: Patient is well-developed, well-nourished in no acute distress.  Resting comfortably  at home.  Head is normocephalic, atraumatic.  No labored breathing.  Speech is clear and coherent with logical content.  Patient is alert and oriented at  baseline.    Assessment and Plan:  1. Pink eye, right (Primary)  - trimethoprim-polymyxin b (POLYTRIM) ophthalmic solution; Place 1 drop into the right eye in the morning, at noon, in the evening, and at bedtime.  Dispense: 10 mL; Refill: 0   You have been diagnosed with an eye infection. You will be treated with the ordered medication, please take and use as directed.  -keep eyes clean and pat dry (you can use baby soap to wash them) -warm compresses -avoid rubbing -change pillowcases -toss make up out that has been recently used -avoid contacts or products in or on the eye until it has improved.  If you develop vision changes, pain with eye movement or changes or worsening in condition please go be seen in person.    Reviewed side effects, risks and benefits of medication.    Patient acknowledged agreement and understanding of the plan.   Past Medical, Surgical, Social History, Allergies, and Medications have been Reviewed.    Follow Up Instructions: I discussed the assessment and treatment plan with the patient. The patient was provided an opportunity to ask questions and all were answered. The patient agreed with the plan and demonstrated an understanding of the instructions.  A copy of instructions were sent to the patient via MyChart unless otherwise noted below.    The patient was advised to call back or seek an in-person evaluation if the symptoms worsen or if the condition fails to improve as anticipated.    Freddy Finner, NP

## 2023-02-13 ENCOUNTER — Telehealth: Payer: Self-pay | Admitting: Family Medicine

## 2023-02-13 ENCOUNTER — Ambulatory Visit: Admitting: Family Medicine

## 2023-02-13 ENCOUNTER — Other Ambulatory Visit: Payer: Self-pay

## 2023-02-13 NOTE — Telephone Encounter (Signed)
Left vm to call back to reschedule missed appt

## 2023-02-22 ENCOUNTER — Other Ambulatory Visit: Payer: Self-pay | Admitting: Gastroenterology

## 2023-04-02 ENCOUNTER — Telehealth: Payer: Self-pay | Admitting: Family Medicine

## 2023-04-02 NOTE — Telephone Encounter (Signed)
 Lvm message to let patient know that Sierra [name] is out of the office 04/03/23. We have rescheduled her with Dr. Girtha Rm at 930am same day 04/03/23.Left phone number to call back if she is not able to keep this appointment.

## 2023-04-03 ENCOUNTER — Ambulatory Visit: Admitting: Family Medicine

## 2023-04-15 ENCOUNTER — Ambulatory Visit: Admitting: Family Medicine

## 2023-04-23 ENCOUNTER — Ambulatory Visit: Admitting: Family Medicine

## 2023-07-04 ENCOUNTER — Encounter: Payer: Self-pay | Admitting: Family Medicine

## 2023-07-04 ENCOUNTER — Ambulatory Visit (INDEPENDENT_AMBULATORY_CARE_PROVIDER_SITE_OTHER): Admitting: Family Medicine

## 2023-07-04 VITALS — BP 125/77 | HR 75 | Temp 98.3°F | Ht 64.0 in | Wt 321.0 lb

## 2023-07-04 DIAGNOSIS — R7989 Other specified abnormal findings of blood chemistry: Secondary | ICD-10-CM | POA: Diagnosis not present

## 2023-07-04 DIAGNOSIS — K219 Gastro-esophageal reflux disease without esophagitis: Secondary | ICD-10-CM | POA: Diagnosis not present

## 2023-07-04 DIAGNOSIS — H6502 Acute serous otitis media, left ear: Secondary | ICD-10-CM

## 2023-07-04 DIAGNOSIS — M797 Fibromyalgia: Secondary | ICD-10-CM

## 2023-07-04 DIAGNOSIS — I10 Essential (primary) hypertension: Secondary | ICD-10-CM | POA: Diagnosis not present

## 2023-07-04 MED ORDER — MONTELUKAST SODIUM 10 MG PO TABS: 10.0000 mg | ORAL_TABLET | Freq: Every day | ORAL | 2 refills | Status: DC

## 2023-07-04 MED ORDER — NIFEDIPINE ER OSMOTIC RELEASE 90 MG PO TB24
90.0000 mg | ORAL_TABLET | Freq: Every day | ORAL | 2 refills | Status: DC
Start: 2023-07-04 — End: 2023-11-27

## 2023-07-04 MED ORDER — AMOXICILLIN-POT CLAVULANATE 875-125 MG PO TABS
1.0000 | ORAL_TABLET | Freq: Two times a day (BID) | ORAL | 0 refills | Status: AC
Start: 2023-07-04 — End: 2023-07-11

## 2023-07-04 MED ORDER — FLUCONAZOLE 150 MG PO TABS: ORAL_TABLET | ORAL | 0 refills | Status: DC

## 2023-07-04 MED ORDER — OMEPRAZOLE 40 MG PO CPDR
40.0000 mg | DELAYED_RELEASE_CAPSULE | Freq: Every day | ORAL | 2 refills | Status: DC
Start: 2023-07-04 — End: 2023-11-27

## 2023-07-04 NOTE — Patient Instructions (Signed)

## 2023-07-04 NOTE — Progress Notes (Signed)
 New Patient Office Visit  Subjective   Patient ID: Sierra [name], female    DOB: February 14, 1980  Age: 43 y.o. MRN: [id number]  CC:  Chief Complaint  Patient presents with   Establish Care    Patient is transferring care and needs refills on her medications.   HPI Sierra [name] is a 43 year old female who presents to establish with [facility] at [facility].   CC: Patient here to establish care at this office. She was established previously at the [facility] location.  Specialists: psychiatry Alys Bacca, PMHNP-BC), OB/GYN, GI   Saw OBGYN 6/18 for follow-up: Perimenopausal symptoms including extreme anxiety, night sweats, irritability, and mood swings. She experiences vaginal bleeding associated with bowel movements and was referred to a gastroenterologist to consider a colonoscopy. She does have a history of IBS and painful bowel movements. She does report overactive bladder- improvement with Myrbetriq.   Despite medications for mental health & being closely followed by psychiatry, anxiety persists. It is through the roof and she reports an increase in irritability. She reports significant sleep disturbances, often sleeping only a couple of hours at night or not at all, and night sweats. Night sweats & sleep are slightly better. She was started on transdermal estrogen therapy and PO progesterone.   Fibromyalgia- Muscle pain & joint pain have worsened She has been avoiding NSAIDs and flexeril  due to decreased kidney function from previous visit  She has seen pain management many years ago and would be willing to see them again  Outpatient Encounter Medications as of 07/04/2023  Medication Sig   amoxicillin-clavulanate (AUGMENTIN) 875-125 MG tablet Take 1 tablet by mouth 2 (two) times daily for 7 days.   amphetamine -dextroamphetamine  (ADDERALL XR) 20 MG 24 hr capsule Take 20 mg by mouth daily.   Biotin  10000 MCG TABS Take by mouth.   buPROPion  (WELLBUTRIN  XL) 150 MG  24 hr tablet Take 150 mg by mouth every morning.   Cariprazine  HCl (VRAYLAR ) 6 MG CAPS Take 6 mg by mouth daily.   cetirizine (ZYRTEC) 10 MG tablet Take 10 mg by mouth daily.   dicyclomine  (BENTYL ) 10 MG capsule TAKE 1 CAPSULE (10 MG TOTAL) BY MOUTH 4 TIMES A DAY BEFORE MEALS AND AT BEDTIME   docusate sodium  (COLACE) 100 MG capsule Take 200 mg by mouth daily.   EPINEPHrine 0.3 mg/0.3 mL IJ SOAJ injection Inject into the muscle once as needed.   Ferrous Sulfate Dried (SLOW RELEASE IRON) 45 MG TBCR Take 45 mg by mouth daily.   fluconazole  (DIFLUCAN ) 150 MG tablet Take 1 tablet after completing antibiotics.   gabapentin  (NEURONTIN ) 600 MG tablet Take 600 mg by mouth 3 (three) times daily.   hydrOXYzine  (ATARAX ) 50 MG tablet Take 50 mg by mouth 3 (three) times daily as needed for anxiety or itching.   lamoTRIgine  (LAMICTAL ) 100 MG tablet Take 100 mg by mouth daily.   levonorgestrel  (MIRENA ) 20 MCG/24HR IUD 1 each by Intrauterine route once.   mirabegron ER (MYRBETRIQ) 25 MG TB24 tablet Take 25 mg by mouth daily.   montelukast  (SINGULAIR ) 10 MG tablet Take 1 tablet (10 mg total) by mouth daily.   Multiple Vitamin (MULTIVITAMIN) tablet Take 1 tablet by mouth daily.   NIFEdipine  (PROCARDIA  XL/NIFEDICAL-XL) 90 MG 24 hr tablet Take 1 tablet (90 mg total) by mouth daily.   omeprazole (PRILOSEC) 40 MG capsule Take 1 capsule (40 mg total) by mouth daily.   QELBREE 200 MG 24 hr capsule Take 200 mg by mouth  daily.   Specialty Vitamins Products (BIOTIN  PLUS KERATIN) 10000-100 MCG-MG TABS Take by mouth.   trimethoprim -polymyxin b  (POLYTRIM ) ophthalmic solution Place 1 drop into the right eye in the morning, at noon, in the evening, and at bedtime.   DOXYCYCLINE HYCLATE PO Take 100 mg by mouth daily. (Patient not taking: Reported on 07/04/2023)   lamoTRIgine  (LAMICTAL ) 25 MG tablet Take 25 mg by mouth daily. (Patient not taking: Reported on 07/04/2023)   RYALTRIS 665-25 MCG/ACT SUSP SMARTSIG:1 Spray(s) Both Nares  Every 12 Hours   [DISCONTINUED] amitriptyline (ELAVIL) 50 MG tablet Take 50 mg by mouth at bedtime.   [DISCONTINUED] amphetamine -dextroamphetamine  (ADDERALL XR) 30 MG 24 hr capsule Take 30 mg by mouth every morning.   [DISCONTINUED] ARIPiprazole  (ABILIFY ) 20 MG tablet Take by mouth.   [DISCONTINUED] cyclobenzaprine  (FLEXERIL ) 10 MG tablet Take 10 mg by mouth 2 (two) times daily as needed for muscle spasms.   [DISCONTINUED] escitalopram  (LEXAPRO ) 10 MG tablet Take 10 mg by mouth daily.   [DISCONTINUED] eszopiclone  (LUNESTA ) 2 MG TABS tablet Take 2 mg by mouth at bedtime.   [DISCONTINUED] montelukast  (SINGULAIR ) 10 MG tablet Take 10 mg by mouth daily.   [DISCONTINUED] NIFEdipine  (PROCARDIA  XL/NIFEDICAL-XL) 90 MG 24 hr tablet Take 90 mg by mouth daily.   [DISCONTINUED] omeprazole (PRILOSEC) 40 MG capsule Take 40 mg by mouth daily.   [DISCONTINUED] prazosin (MINIPRESS) 2 MG capsule Take 2 mg by mouth at bedtime.   [DISCONTINUED] prazosin (MINIPRESS) 5 MG capsule Take 5 mg by mouth at bedtime.   [DISCONTINUED] topiramate  (TOPAMAX ) 100 MG tablet Take 100 mg by mouth daily.   No facility-administered encounter medications on file as of 07/04/2023.    Patient Active Problem List   Diagnosis Date Noted   Elevated blood pressure reading 12/22/2020   Tachycardia 08/23/2020   Insomnia due to medical condition 08/10/2020   Elevated serum creatinine 08/04/2020   Tremor 03/01/2020   At risk for prolonged QT interval syndrome 02/11/2020   Bipolar disorder, in full remission, most recent episode depressed (HCC) 03/20/2019   Bipolar 1 disorder, depressed, mild (HCC) 02/06/2019   Bipolar disorder, in partial remission, most recent episode mixed (HCC) 01/14/2019   High risk medication use 10/20/2018   Bipolar I disorder, most recent episode mixed (HCC) 07/14/2018   Attention deficit hyperactivity disorder (ADHD), 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   Insomnia due to mental condition 07/14/2018    Major depressive disorder, 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/22/2017   GAD (generalized anxiety disorder) 05/22/2017   Acetaminophen  overdose 05/21/2017   Bipolar 1 disorder, depressed, moderate (HCC) 05/21/2017   Left ureteral calculus 04/25/2016   Tingling in extremities 08/29/2015   Acute nonintractable headache 08/29/2015   Visual disturbance 08/23/2015   Polydipsia 03/23/2014   Binge eating disorder 03/23/2014   Vaginitis and vulvovaginitis 12/31/2013   Paronychia 09/21/2013   Essential hypertension, benign 06/05/2013   Bilateral leg pain 05/22/2013   Right carpal tunnel syndrome 09/16/2012   General counseling and advice on female contraception 01/22/2012   Anhydramnios 01/02/2012   Umbilical cord complication 12/07/2011   Single umbilical artery, maternal, antepartum 12/07/2011   High-risk pregnancy 11/09/2011   H/O cesarean section 10/12/2011   Calculus of kidney 10/12/2011   Obesity affecting pregnancy, antepartum 10/12/2011   Bipolar disorder, in partial remission, most recent episode depressed (HCC) 10/12/2011   History of cesarean section 10/12/2011   SNORING 07/23/2007   EDEMA 06/11/2007   Morbid obesity (HCC) 12/23/2006   GERD 12/23/2006   MILD/UNSPEC PRE-ECLAMPSIA UNSPEC  AS EPISODE CARE 12/23/2006   Fibromyalgia 12/23/2006   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   Allergy    Anemia    Anxiety state, unspecified    Bipolar 1 disorder, depressed (HCC) 05/21/2017   BIPOLAR AFFECTIVE DISORDER 12/23/2006   Qualifier: Diagnosis of  By: Cherlyn Cornet MD, Amy     Bipolar affective disorder (HCC) 10/12/2011   Bipolar disorder, unspecified (HCC)    Depressive disorder, not elsewhere classified    Edema    Esophageal reflux    Fibromyalgia    Headache    Heart murmur    History of kidney stones    Mild or unspecified pre-eclampsia, unspecified as to episode of  care    Morbid obesity (HCC)    Myalgia and myositis, unspecified    Other dyspnea and respiratory abnormality    Preeclampsia    with first pregnancy   Raynaud's disease    Urinary tract infection, site not specified    Past Surgical History:  Procedure Laterality Date   CESAREAN SECTION     X 2   CHOLECYSTECTOMY  03/2006   CYSTOSCOPY W/ URETERAL STENT PLACEMENT Left 05/14/2016   Procedure: CYSTOSCOPY WITH STENT REPLACEMENT;  Surgeon: Dustin Gimenez, MD;  Location: [facility];  Service: Urology;  Laterality: Left;   CYSTOSCOPY WITH STENT PLACEMENT Left 04/25/2016   Procedure: CYSTOSCOPY WITH STENT PLACEMENT;  Surgeon: Dustin Gimenez, MD;  Location: [facility];  Service: Urology;  Laterality: Left;   DILATION AND CURETTAGE OF UTERUS     HERNIA REPAIR     INTRAUTERINE DEVICE (IUD) INSERTION     paragard removed & mirena  inserted   IVC FILTER INSERTION  03/26/2016   [facility]   LAPAROSCOPIC GASTRIC RESTRICTIVE DUODENAL PROCEDURE (DUODENAL SWITCH)  03/2016   LITHOTRIPSY     SMALL INTESTINE SURGERY     TONSILLECTOMY AND ADENOIDECTOMY     URETEROSCOPY WITH HOLMIUM LASER LITHOTRIPSY Left 05/14/2016   Procedure: URETEROSCOPY WITH HOLMIUM LASER LITHOTRIPSY;  Surgeon: Dustin Gimenez, MD;  Location: [facility];  Service: Urology;  Laterality: Left;   Family History  Problem Relation Age of Onset   Depression Mother    Hyperlipidemia Mother    Sleep apnea Mother    Asthma Mother    Hypertension Mother    Depression Father    Alcohol abuse Father    Early death Father    Depression Sister    Hyperlipidemia Brother    Depression Brother    Hyperlipidemia Brother    Depression Brother    Alcohol abuse Brother    Lung cancer Maternal Grandfather    Emphysema Maternal Grandfather    Coronary artery disease Maternal Grandfather    Alcohol abuse Maternal Grandfather    Cancer Maternal Grandfather    COPD Maternal Grandfather    Hypertension Maternal Grandfather    Coronary artery  disease Maternal Grandmother    Heart attack Maternal Grandmother    Diabetes Maternal Grandmother    Alcohol abuse Maternal Grandmother    Heart disease Maternal Grandmother    Hypertension Maternal Grandmother    Obesity Maternal Grandmother    Lupus Other        Aunt   Fibromyalgia Other        Aunt   Depression Son    Anxiety disorder Son    ADD / ADHD Son    ADD / ADHD Son  Social History   Socioeconomic History   Marital status: Married    Spouse name: brian   Number of children: 2   Years of education: Not on file   Highest education level: Associate degree: academic program  Occupational History   Occupation: Home maker  Tobacco Use   Smoking status: Never   Smokeless tobacco: Never  Vaping Use   Vaping status: Never Used  Substance and Sexual Activity   Alcohol use: Yes    Alcohol/week: 5.0 standard drinks of alcohol    Types: 5 Glasses of wine per week    Comment: occassional   Drug use: No   Sexual activity: Not Currently    Partners: Male    Birth control/protection: I.U.D.    Comment: mirena  inserted 09-01-18  Other Topics Concern   Not on file  Social History Narrative   Regular exercise-noDiet: no fast food, likes sweets   Social Drivers of Health   Financial Resource Strain: Patient Declined (07/03/2023)   Overall Financial Resource Strain (CARDIA)    Difficulty of Paying Living Expenses: Patient declined  Food Insecurity: Patient Declined (07/03/2023)   Hunger Vital Sign    Worried About Running Out of Food in the Last Year: Patient declined    Ran Out of Food in the Last Year: Patient declined  Transportation Needs: No Transportation Needs (07/03/2023)   PRAPARE - Administrator, Civil Service (Medical): No    Lack of Transportation (Non-Medical): No  Physical Activity: Inactive (07/03/2023)   Exercise Vital Sign    Days of Exercise per Week: 0 days    Minutes of Exercise per Session: Not on file  Stress: Stress Concern Present  (07/03/2023)   [employer] of Occupational Health - Occupational Stress Questionnaire    Feeling of Stress: Very much  Social Connections: Moderately Isolated (07/03/2023)   Social Connection and Isolation Panel    Frequency of Communication with Friends and Family: Three times a week    Frequency of Social Gatherings with Friends and Family: Once a week    Attends Religious Services: Never    Database administrator or Organizations: No    Attends Engineer, structural: Not on file    Marital Status: Married  Catering manager Violence: Not At Risk (12/17/2016)   Humiliation, Afraid, Rape, and Kick questionnaire    Fear of Current or Ex-Partner: No    Emotionally Abused: No    Physically Abused: No    Sexually Abused: No   Outpatient Medications Prior to Visit  Medication Sig Dispense Refill   amphetamine -dextroamphetamine  (ADDERALL XR) 20 MG 24 hr capsule Take 20 mg by mouth daily.     Biotin  10000 MCG TABS Take by mouth.     buPROPion  (WELLBUTRIN  XL) 150 MG 24 hr tablet Take 150 mg by mouth every morning.     Cariprazine  HCl (VRAYLAR ) 6 MG CAPS Take 6 mg by mouth daily.     cetirizine (ZYRTEC) 10 MG tablet Take 10 mg by mouth daily.     dicyclomine  (BENTYL ) 10 MG capsule TAKE 1 CAPSULE (10 MG TOTAL) BY MOUTH 4 TIMES A DAY BEFORE MEALS AND AT BEDTIME 90 capsule 2   docusate sodium  (COLACE) 100 MG capsule Take 200 mg by mouth daily.     EPINEPHrine 0.3 mg/0.3 mL IJ SOAJ injection Inject into the muscle once as needed.     Ferrous Sulfate Dried (SLOW RELEASE IRON) 45 MG TBCR Take 45 mg by mouth daily.  gabapentin  (NEURONTIN ) 600 MG tablet Take 600 mg by mouth 3 (three) times daily.     hydrOXYzine  (ATARAX ) 50 MG tablet Take 50 mg by mouth 3 (three) times daily as needed for anxiety or itching.     lamoTRIgine  (LAMICTAL ) 100 MG tablet Take 100 mg by mouth daily.     levonorgestrel  (MIRENA ) 20 MCG/24HR IUD 1 each by Intrauterine route once.     mirabegron ER (MYRBETRIQ) 25  MG TB24 tablet Take 25 mg by mouth daily.     Multiple Vitamin (MULTIVITAMIN) tablet Take 1 tablet by mouth daily.     QELBREE 200 MG 24 hr capsule Take 200 mg by mouth daily.     Specialty Vitamins Products (BIOTIN  PLUS KERATIN) 10000-100 MCG-MG TABS Take by mouth.     trimethoprim -polymyxin b  (POLYTRIM ) ophthalmic solution Place 1 drop into the right eye in the morning, at noon, in the evening, and at bedtime. 10 mL 0   DOXYCYCLINE HYCLATE PO Take 100 mg by mouth daily. (Patient not taking: Reported on 07/04/2023)     lamoTRIgine  (LAMICTAL ) 25 MG tablet Take 25 mg by mouth daily. (Patient not taking: Reported on 07/04/2023)     RYALTRIS 665-25 MCG/ACT SUSP SMARTSIG:1 Spray(s) Both Nares Every 12 Hours     amitriptyline (ELAVIL) 50 MG tablet Take 50 mg by mouth at bedtime.     amphetamine -dextroamphetamine  (ADDERALL XR) 30 MG 24 hr capsule Take 30 mg by mouth every morning.     ARIPiprazole  (ABILIFY ) 20 MG tablet Take by mouth.     cyclobenzaprine  (FLEXERIL ) 10 MG tablet Take 10 mg by mouth 2 (two) times daily as needed for muscle spasms.     escitalopram  (LEXAPRO ) 10 MG tablet Take 10 mg by mouth daily.     eszopiclone  (LUNESTA ) 2 MG TABS tablet Take 2 mg by mouth at bedtime.     montelukast  (SINGULAIR ) 10 MG tablet Take 10 mg by mouth daily.     NIFEdipine  (PROCARDIA  XL/NIFEDICAL-XL) 90 MG 24 hr tablet Take 90 mg by mouth daily.     omeprazole (PRILOSEC) 40 MG capsule Take 40 mg by mouth daily.     prazosin (MINIPRESS) 2 MG capsule Take 2 mg by mouth at bedtime.     prazosin (MINIPRESS) 5 MG capsule Take 5 mg by mouth at bedtime.     topiramate  (TOPAMAX ) 100 MG tablet Take 100 mg by mouth daily.     No facility-administered medications prior to visit.   Allergies  Allergen Reactions   Uncaria Tomentosa (Cats Claw) Other (See Comments)    allgeric to cats in general causes sneezing   Bee Pollen Itching   Dog Epithelium Other (See Comments)    Stuffy nose   Molds & Smuts Other (See  Comments)   Pollen Extract    Milk-Related Compounds     Able to tolerate yogurt; mild intolerance to milk/cheese   Pineapple Rash   Soy Allergy (Obsolete) Diarrhea   Strawberry Extract Rash    Break out on tongue noted Break out on tongue noted Break out on tongue noted   ROS: see HPI    Objective  Today's Vitals   07/04/23 0903  BP: 125/77  Pulse: 75  Temp: 98.3 F (36.8 C)  TempSrc: Oral  SpO2: 98%  Weight: (!) 321 lb (145.6 kg)  Height: 5' 4 (1.626 m)   Physical Exam Vitals reviewed.  Constitutional:      Appearance: Normal appearance. She is obese.  HENT:     Right  Ear: Tympanic membrane, ear canal and external ear normal.     Left Ear: External ear normal. Swelling (of middle ear canal with erythema) present. Tympanic membrane is injected.   Cardiovascular:     Rate and Rhythm: Normal rate and regular rhythm.     Pulses: Normal pulses.     Heart sounds: Normal heart sounds.  Pulmonary:     Effort: Pulmonary effort is normal.     Breath sounds: Normal breath sounds.   Musculoskeletal:     Right lower leg: No edema.     Left lower leg: No edema.   Neurological:     Mental Status: She is alert.   Psychiatric:        Mood and Affect: Mood normal. Affect is flat.        Behavior: Behavior normal.      Assessment & Plan:   1. Essential hypertension, benign (Primary) Patient presents today with well-controlled blood pressure. Patient in no acute distress and is well-appearing. Denies chest pain, shortness of breath, lower extremity edema, vision changes, headaches. Cardiovascular exam with heart regular rate and rhythm. Normal heart sounds, no murmurs present. No lower extremity edema present. Lungs clear to auscultation bilaterally. Patient is currently taking nifedipine  90mg  daily. Refills provided today. Advised patient to monitor blood pressure occasionally. Return to office sooner if blood pressure begins to increase greater than 130/80.  - NIFEdipine   (PROCARDIA  XL/NIFEDICAL-XL) 90 MG 24 hr tablet; Take 1 tablet (90 mg total) by mouth daily.  Dispense: 30 tablet; Refill: 2  2. Fibromyalgia Patient reports an increase in her muscle pain and joint pain. She has been taking Tylenol , due to previous decrease in kidney function. She reports she has tolerated Flexeril  in the past but has not been taking this. Will repeat kidney function to determine best medication for pain management. May be reasonable to refer patient to pain clinic, and she is willing to go see them.   3. Elevated serum creatinine Previous serum creatinine 1.25 with eGFR 56. Will repeat kidney function today.  - Basic Metabolic Panel (BMET)  4. Gastroesophageal reflux disease, unspecified whether esophagitis present Rx sent to pharmacy on file.  - omeprazole (PRILOSEC) 40 MG capsule; Take 1 capsule (40 mg total) by mouth daily.  Dispense: 30 capsule; Refill: 2  5. Non-recurrent acute serous otitis media of left ear Patient presents today with concern of a painful lump in her left ear that has been noticeable for about the past week. Bilateral pinnae, tragus, and ear canals are non-tender and without swelling, cerumen impaction and discharge. The right tympanic membrane is translucent, pearly grey, and shiny with no bulging or retraction. Normal in appearance with good cone-shaped light reflection of light and smooth consistency. The L middel ear canal is erythematous and tender. Landmarks are difficult to visualize clearly- TM is cloudy and dull. Hearing is intact. Antibiotic medication sent to pharmacy on file, along with diflucan  to take after completion of antibiotics.  - amoxicillin-clavulanate (AUGMENTIN) 875-125 MG tablet; Take 1 tablet by mouth 2 (two) times daily for 7 days.  Dispense: 14 tablet; Refill: 0 - fluconazole  (DIFLUCAN ) 150 MG tablet; Take 1 tablet after completing antibiotics.  Dispense: 1 tablet; Refill: 0   Return in about 3 months (around 10/04/2023) for  chronic conditions .   Wilhelmena Hanson, FNP

## 2023-07-05 LAB — BASIC METABOLIC PANEL WITH GFR
BUN/Creatinine Ratio: 15 (ref 9–23)
BUN: 15 mg/dL (ref 6–24)
CO2: 20 mmol/L (ref 20–29)
Calcium: 9.4 mg/dL (ref 8.7–10.2)
Chloride: 106 mmol/L (ref 96–106)
Creatinine, Ser: 1.01 mg/dL — ABNORMAL HIGH (ref 0.57–1.00)
Glucose: 91 mg/dL (ref 70–99)
Potassium: 4.7 mmol/L (ref 3.5–5.2)
Sodium: 139 mmol/L (ref 134–144)
eGFR: 72 mL/min/{1.73_m2} (ref >59)

## 2023-07-08 ENCOUNTER — Ambulatory Visit: Payer: Self-pay | Admitting: Family Medicine

## 2023-07-08 ENCOUNTER — Other Ambulatory Visit: Payer: Self-pay | Admitting: Family Medicine

## 2023-07-08 ENCOUNTER — Other Ambulatory Visit: Payer: Self-pay

## 2023-07-08 DIAGNOSIS — M797 Fibromyalgia: Secondary | ICD-10-CM

## 2023-07-08 MED ORDER — CYCLOBENZAPRINE HCL 10 MG PO TABS: 10.0000 mg | ORAL_TABLET | Freq: Three times a day (TID) | ORAL | 1 refills | Status: AC | PRN

## 2023-07-08 NOTE — Telephone Encounter (Signed)
 Flexeril  prescription was sent per Ms. Butler.

## 2023-09-19 ENCOUNTER — Ambulatory Visit (INDEPENDENT_AMBULATORY_CARE_PROVIDER_SITE_OTHER): Admitting: Family Medicine

## 2023-09-19 ENCOUNTER — Encounter: Payer: Self-pay | Admitting: Family Medicine

## 2023-09-19 VITALS — BP 137/84 | HR 84 | Temp 97.9°F | Resp 16 | Ht 64.0 in | Wt 317.0 lb

## 2023-09-19 DIAGNOSIS — R051 Acute cough: Secondary | ICD-10-CM

## 2023-09-19 DIAGNOSIS — J0100 Acute maxillary sinusitis, unspecified: Secondary | ICD-10-CM | POA: Diagnosis not present

## 2023-09-19 LAB — POC COVID19/FLU A&B COMBO
Covid Antigen, POC: NEGATIVE
Influenza A Antigen, POC: NEGATIVE
Influenza B Antigen, POC: NEGATIVE

## 2023-09-19 MED ORDER — PROMETHAZINE-DM 6.25-15 MG/5ML PO SYRP: 5.0000 mL | ORAL_SOLUTION | Freq: Four times a day (QID) | ORAL | 0 refills | Status: DC | PRN

## 2023-09-19 MED ORDER — AMOXICILLIN 500 MG PO CAPS: 500.0000 mg | ORAL_CAPSULE | Freq: Two times a day (BID) | ORAL | 0 refills | Status: AC

## 2023-09-19 NOTE — Progress Notes (Signed)
 Acute Care Office Visit  Subjective:   Sierra [name] 1980-03-23 09/19/2023  Chief Complaint  Patient presents with   Nasal Congestion    Runny nose, fever, congestion started Monday.    Discussed the use of AI scribe software for clinical note transcription with the patient, who gave verbal consent to proceed.  History of Present Illness   Sierra [name] is a 43 year old female who presents for an acute visit with fever, nasal congestion, runny nose, and cough.  She has been experiencing fever, nasal congestion, and runny nose since Monday, with symptoms worsening over time. Initially, she thought she had a cold similar to her son's recent mild congestion and cough, but her symptoms are more severe and are lasting longer.   She describes significant sinus pressure, especially when blowing her nose, and has used Afrin to relieve the pressure. She is cautious with Afrin due to potential rebound congestion, which her husband experienced. The nasal discharge has been clear but thickened this morning, without yellow or green coloration.  No headache, ear pain, sore throat, or significant cough, although she had a scratchy throat initially and a little coughing this morning. No nausea, vomiting, or abdominal pain. She noted a fever on the first couple of days but not last night despite feeling chills.  She has a history of chronic ear infections since childhood and is very susceptible to them. She reports difficulty sleeping due to her symptoms.      The following portions of the patient's history were reviewed and updated as appropriate: past medical history, past surgical history, family history, social history, allergies, medications, and problem list.   Patient Active Problem List   Diagnosis Date Noted   Elevated serum creatinine 08/04/2020   At risk for prolonged QT interval syndrome 02/11/2020   High risk medication use 10/20/2018   Bipolar I disorder, most recent  episode mixed (HCC) 07/14/2018   Attention deficit hyperactivity disorder (ADHD), 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   Insomnia due to mental condition 07/14/2018   Major depressive disorder, 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/22/2017   GAD (generalized anxiety disorder) 05/22/2017   Tingling in extremities 08/29/2015   Binge eating disorder 03/23/2014   Paronychia 09/21/2013   Essential hypertension, benign 06/05/2013   Bilateral leg pain 05/22/2013   Right carpal tunnel syndrome 09/16/2012   Bipolar disorder, in partial remission, most recent episode depressed (HCC) 10/12/2011   SNORING 07/23/2007   Morbid obesity (HCC) 12/23/2006   GERD 12/23/2006   Fibromyalgia 12/23/2006   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   Allergy    Anemia    Anxiety state, unspecified    Arthritis    Bipolar 1 disorder, depressed (HCC) 05/21/2017   BIPOLAR AFFECTIVE DISORDER 12/23/2006   Qualifier: Diagnosis of  By: Avelina MD, Amy     Bipolar affective disorder (HCC) 10/12/2011   Bipolar disorder, unspecified (HCC)    Calculus of kidney 10/12/2011   Depressive disorder, not elsewhere classified    Edema    Esophageal reflux    Fibromyalgia    H/O cesarean section 10/12/2011   Overview:   c-section X 2      Headache    Heart murmur    History of cesarean section 10/12/2011   Overview:   c-section X 2      History of kidney stones  Left ureteral calculus 04/25/2016   Mild or unspecified pre-eclampsia, unspecified as to episode of care    Morbid obesity (HCC)    Myalgia and myositis, unspecified    Other dyspnea and respiratory abnormality    Preeclampsia    with first pregnancy   Raynaud's disease    Single umbilical artery, maternal, antepartum 12/07/2011   Formatting of this note might be different from the original.  Suspected on first tri screen.  Pt has been counseled  in implications.  Will need follow up at anatomy scan.     Umbilical cord complication 12/07/2011   Overview:   Suspected on first tri screen.  Pt has been counseled in implications.  Will need follow up at anatomy scan.     Urinary tract infection, site not specified    Past Surgical History:  Procedure Laterality Date   CESAREAN SECTION     X 2   CHOLECYSTECTOMY  03/2006   CYSTOSCOPY W/ URETERAL STENT PLACEMENT Left 05/14/2016   Procedure: CYSTOSCOPY WITH STENT REPLACEMENT;  Surgeon: Rosina Riis, MD;  Location: [facility];  Service: Urology;  Laterality: Left;   CYSTOSCOPY WITH STENT PLACEMENT Left 04/25/2016   Procedure: CYSTOSCOPY WITH STENT PLACEMENT;  Surgeon: Rosina Riis, MD;  Location: [facility];  Service: Urology;  Laterality: Left;   DILATION AND CURETTAGE OF UTERUS     HERNIA REPAIR     INTRAUTERINE DEVICE (IUD) INSERTION     paragard removed & mirena  inserted   IVC FILTER INSERTION  03/26/2016   [facility]   LAPAROSCOPIC GASTRIC RESTRICTIVE DUODENAL PROCEDURE (DUODENAL SWITCH)  03/2016   LITHOTRIPSY     SMALL INTESTINE SURGERY     TONSILLECTOMY AND ADENOIDECTOMY     URETEROSCOPY WITH HOLMIUM LASER LITHOTRIPSY Left 05/14/2016   Procedure: URETEROSCOPY WITH HOLMIUM LASER LITHOTRIPSY;  Surgeon: Rosina Riis, MD;  Location: [facility];  Service: Urology;  Laterality: Left;   Family History  Problem Relation Age of Onset   Depression Mother    Hyperlipidemia Mother    Sleep apnea Mother    Asthma Mother    Hypertension Mother    Depression Father    Alcohol abuse Father    Early death Father    Depression Sister    Hyperlipidemia Brother    Depression Brother    Hyperlipidemia Brother    Depression Brother    Alcohol abuse Brother    Lung cancer Maternal Grandfather    Emphysema Maternal Grandfather    Coronary artery disease Maternal Grandfather    Alcohol abuse Maternal Grandfather    Cancer Maternal Grandfather    COPD Maternal Grandfather     Hypertension Maternal Grandfather    Coronary artery disease Maternal Grandmother    Heart attack Maternal Grandmother    Diabetes Maternal Grandmother    Alcohol abuse Maternal Grandmother    Heart disease Maternal Grandmother    Hypertension Maternal Grandmother    Obesity Maternal Grandmother    Lupus Other        Aunt   Fibromyalgia Other        Aunt   Depression Son    Anxiety disorder Son    ADD / ADHD Son    ADD / ADHD Son    Outpatient Medications Prior to Visit  Medication Sig Dispense Refill   amphetamine -dextroamphetamine  (ADDERALL XR) 20 MG 24 hr capsule Take 20 mg by mouth daily.     Biotin  10000 MCG TABS Take by mouth.     buPROPion  (WELLBUTRIN  XL) 150  MG 24 hr tablet Take 150 mg by mouth every morning.     Cariprazine  HCl (VRAYLAR ) 6 MG CAPS Take 6 mg by mouth daily.     cetirizine (ZYRTEC) 10 MG tablet Take 10 mg by mouth daily.     cyclobenzaprine  (FLEXERIL ) 10 MG tablet Take 1 tablet (10 mg total) by mouth 3 (three) times daily as needed for muscle spasms. 30 tablet 1   dicyclomine  (BENTYL ) 10 MG capsule TAKE 1 CAPSULE (10 MG TOTAL) BY MOUTH 4 TIMES A DAY BEFORE MEALS AND AT BEDTIME 90 capsule 2   docusate sodium  (COLACE) 100 MG capsule Take 200 mg by mouth daily.     EPINEPHrine 0.3 mg/0.3 mL IJ SOAJ injection Inject into the muscle once as needed.     Estradiol  1.25 MG/1.25GM GEL APPLY 1 PACKET BY TRANSDERMAL ROUTE EVERY DAY     Ferrous Sulfate Dried (SLOW RELEASE IRON) 45 MG TBCR Take 45 mg by mouth daily.     fluconazole  (DIFLUCAN ) 150 MG tablet Take 1 tablet after completing antibiotics. 1 tablet 0   hydrOXYzine  (ATARAX ) 50 MG tablet Take 50 mg by mouth 3 (three) times daily as needed for anxiety or itching.     lamoTRIgine  (LAMICTAL ) 100 MG tablet Take 100 mg by mouth daily.     levonorgestrel  (MIRENA ) 20 MCG/24HR IUD 1 each by Intrauterine route once.     mirabegron ER (MYRBETRIQ) 25 MG TB24 tablet Take 25 mg by mouth daily.     montelukast  (SINGULAIR ) 10  MG tablet Take 1 tablet (10 mg total) by mouth daily. 30 tablet 2   Multiple Vitamin (MULTIVITAMIN) tablet Take 1 tablet by mouth daily.     NIFEdipine  (PROCARDIA  XL/NIFEDICAL-XL) 90 MG 24 hr tablet Take 1 tablet (90 mg total) by mouth daily. 30 tablet 2   omeprazole  (PRILOSEC) 40 MG capsule Take 1 capsule (40 mg total) by mouth daily. 30 capsule 2   progesterone (PROMETRIUM) 100 MG capsule TAKE 3 CAPSULES EVERY DAY BY ORAL ROUTE AT BEDTIME FOR 90 DAYS.     progesterone (PROMETRIUM) 200 MG capsule Take 200 mg by mouth.     QELBREE 200 MG 24 hr capsule Take 200 mg by mouth daily.     RYALTRIS 665-25 MCG/ACT SUSP SMARTSIG:1 Spray(s) Both Nares Every 12 Hours     Specialty Vitamins Products (BIOTIN  PLUS KERATIN) 10000-100 MCG-MG TABS Take by mouth.     trimethoprim -polymyxin b  (POLYTRIM ) ophthalmic solution Place 1 drop into the right eye in the morning, at noon, in the evening, and at bedtime. 10 mL 0   estradiol  (CLIMARA  - DOSED IN MG/24 HR) 0.1 mg/24hr patch Place 0.1 mg onto the skin once a week.     gabapentin  (NEURONTIN ) 600 MG tablet Take 600 mg by mouth 3 (three) times daily. (Patient not taking: Reported on 09/19/2023)     No facility-administered medications prior to visit.   Allergies  Allergen Reactions   Uncaria Tomentosa (Cats Claw) Other (See Comments)    allgeric to cats in general causes sneezing   Bee Pollen Itching   Dog Epithelium Other (See Comments)    Stuffy nose   Molds & Smuts Other (See Comments)   Pollen Extract    Milk-Related Compounds     Able to tolerate yogurt; mild intolerance to milk/cheese   Pineapple Rash   Soy Allergy (Obsolete) Diarrhea   Strawberry Extract Rash    Break out on tongue noted Break out on tongue noted Break out on tongue noted  ROS: A complete ROS was performed with pertinent positives/negatives noted in the HPI. The remainder of the ROS are negative.    Objective:   Today's Vitals   09/19/23 0811  BP: 137/84  Pulse: 84  Resp:  16  Temp: 97.9 F (36.6 C)  TempSrc: Oral  SpO2: 98%  Weight: (!) 317 lb (143.8 kg)  Height: 5' 4 (1.626 m)  PainSc: 0-No pain   Physical Exam Vitals reviewed.  Constitutional:      Appearance: Normal appearance. She is obese.  HENT:     Right Ear: Tympanic membrane, ear canal and external ear normal. No tenderness. No middle ear effusion. Tympanic membrane is not erythematous.     Left Ear: Tympanic membrane, ear canal and external ear normal. No tenderness.  No middle ear effusion. Tympanic membrane is not erythematous.     Ears:     Comments: Bilateral bulging without signs of infection or effusion present     Nose:     Right Turbinates: Swollen. Not pale.     Left Turbinates: Swollen. Not pale.     Right Sinus: Maxillary sinus tenderness present. No frontal sinus tenderness.     Left Sinus: Maxillary sinus tenderness present. No frontal sinus tenderness.     Mouth/Throat:     Pharynx: Posterior oropharyngeal erythema and postnasal drip present. No uvula swelling.     Tonsils: No tonsillar exudate or tonsillar abscesses.  Cardiovascular:     Rate and Rhythm: Normal rate and regular rhythm.     Pulses: Normal pulses.     Heart sounds: Normal heart sounds.  Pulmonary:     Effort: Pulmonary effort is normal.     Breath sounds: Normal breath sounds.  Neurological:     Mental Status: She is alert.  Psychiatric:        Mood and Affect: Mood normal.        Behavior: Behavior normal.       Assessment & Plan:   1. Acute non-recurrent maxillary sinusitis (Primary) Symptoms and sinus tenderness suggest sinus infection. Differential includes COVID-19 and influenza, pending test results. No antibiotic allergies. Afrin use discussed with caution. Prescribe amoxicillin  twice a day for seven days. Continue Afrin for a day or two if needed. Use Tylenol  or Advil  for fever and chills. Will send COVID-19 and influenza test results via MyChart. Rx sent to pharmacy on file. Advised patient  to reach out to office if symptoms do not improve or worsen.  - amoxicillin  (AMOXIL ) 500 MG capsule; Take 1 capsule (500 mg total) by mouth 2 (two) times daily for 7 days.  Dispense: 14 capsule; Refill: 0  2. Acute cough Mild cough, no severe symptoms. Patient in no acute distress and is well-appearing. Denies chest pain, shortness of breath, lower extremity edema, vision changes. Cardiovascular exam with heart regular rate and rhythm. Normal heart sounds. Lungs clear to auscultation bilaterally- no adventitious sounds present. Will test for COVID & influenza today. Rx sent for cough medication.  - POC Covid19/Flu A&B Antigen - promethazine -dextromethorphan (PROMETHAZINE -DM) 6.25-15 MG/5ML syrup; Take 5 mLs by mouth 4 (four) times daily as needed for cough (Maximum dose: 30mL in 24 hours).  Dispense: 118 mL; Refill: 0  Meds ordered this encounter  Medications   amoxicillin  (AMOXIL ) 500 MG capsule    Sig: Take 1 capsule (500 mg total) by mouth 2 (two) times daily for 7 days.    Dispense:  14 capsule    Refill:  0    Supervising Provider:  ZIGLAR, SUSAN K [[id number]]   promethazine -dextromethorphan (PROMETHAZINE -DM) 6.25-15 MG/5ML syrup    Sig: Take 5 mLs by mouth 4 (four) times daily as needed for cough (Maximum dose: 30mL in 24 hours).    Dispense:  118 mL    Refill:  0    Supervising Provider:   ZIGLAR, SUSAN K [[id number]]   Lab Orders         POC Covid19/Flu A&B Antigen     Return if symptoms worsen or fail to improve.    Patient to reach out to office if new, worrisome, or unresolved symptoms arise or if no improvement in patient's condition. Patient verbalized understanding and is agreeable to treatment plan. All questions answered to patient's satisfaction.    Evalene Arts, FNP

## 2023-09-19 NOTE — Patient Instructions (Signed)
 Vitamin Regimen:  Vitamin C 500mg  twice daily  Vitamin D 5000 units once daily  Zinc 50-75mg  once daily   Over the counter Medications (*unless allergic or contraindicated*):  Use Tylenol (acetaminophen) for fever/pain Use Advil (ibuprofen) for fever/pain/inflammation   Non-Medication Therapy:  Drink plenty of fluids and stay hydrated.  A teaspoon of honey may help ease coughing symptoms.  Cough drops or hard candy for coughing.   Over the Counter Medication Therapy:  Use a cough expectorant such as guaifenesin (Mucinex) if recommended by your doctor for a wet, congested cough. If you have high blood pressure, please ask your doctor first before using this.  Use a cough suppressant such as dextromethorphan (Robitussin/Delsym) for a dry cough. If you have high blood pressure, please ask your doctor first before using this.  If you have high blood pressure, medication such as Coricidin HBP is safe to take for your cough and will not increase your blood pressure.

## 2023-09-20 ENCOUNTER — Encounter: Payer: Self-pay | Admitting: Family Medicine

## 2023-10-04 ENCOUNTER — Ambulatory Visit: Admitting: Family Medicine

## 2023-10-21 ENCOUNTER — Other Ambulatory Visit: Payer: Self-pay | Admitting: Family Medicine

## 2023-10-21 DIAGNOSIS — K219 Gastro-esophageal reflux disease without esophagitis: Secondary | ICD-10-CM

## 2023-11-27 ENCOUNTER — Encounter: Payer: Self-pay | Admitting: Family Medicine

## 2023-11-27 ENCOUNTER — Ambulatory Visit (INDEPENDENT_AMBULATORY_CARE_PROVIDER_SITE_OTHER): Admitting: Family Medicine

## 2023-11-27 VITALS — BP 144/69 | HR 74 | Temp 98.1°F | Resp 18 | Ht 64.0 in | Wt 323.0 lb

## 2023-11-27 DIAGNOSIS — K219 Gastro-esophageal reflux disease without esophagitis: Secondary | ICD-10-CM | POA: Diagnosis not present

## 2023-11-27 DIAGNOSIS — K580 Irritable bowel syndrome with diarrhea: Secondary | ICD-10-CM

## 2023-11-27 DIAGNOSIS — M25512 Pain in left shoulder: Secondary | ICD-10-CM | POA: Diagnosis not present

## 2023-11-27 DIAGNOSIS — T7840XD Allergy, unspecified, subsequent encounter: Secondary | ICD-10-CM

## 2023-11-27 DIAGNOSIS — K589 Irritable bowel syndrome without diarrhea: Secondary | ICD-10-CM | POA: Insufficient documentation

## 2023-11-27 DIAGNOSIS — I7300 Raynaud's syndrome without gangrene: Secondary | ICD-10-CM

## 2023-11-27 MED ORDER — OMEPRAZOLE 40 MG PO CPDR: 40.0000 mg | DELAYED_RELEASE_CAPSULE | Freq: Every day | ORAL | 3 refills | Status: AC

## 2023-11-27 MED ORDER — NIFEDIPINE ER OSMOTIC RELEASE 90 MG PO TB24: 90.0000 mg | ORAL_TABLET | Freq: Every day | ORAL | 3 refills | Status: AC

## 2023-11-27 MED ORDER — HYOSCYAMINE SULFATE 0.125 MG PO TABS: 0.1250 mg | ORAL_TABLET | ORAL | 3 refills | Status: AC | PRN

## 2023-11-27 MED ORDER — MONTELUKAST SODIUM 10 MG PO TABS: 10.0000 mg | ORAL_TABLET | Freq: Every day | ORAL | 3 refills | Status: AC

## 2023-11-27 MED ORDER — TRULICITY 0.75 MG/0.5ML ~~LOC~~ SOAJ: 0.7500 mg | SUBCUTANEOUS | 11 refills | Status: DC

## 2023-11-27 NOTE — Assessment & Plan Note (Signed)
 Has decreased range of motion and pain with certain movements.  Appears to be rotator cuff related.  Referral to physical therapy.

## 2023-11-27 NOTE — Progress Notes (Signed)
 4  Established Patient Office Visit  Subjective   Patient ID: Sierra [name], female    DOB: 1981/01/14  Age: 43 y.o. MRN: [id number]  Chief Complaint  Patient presents with   Medical Management of Chronic Issues   Leg Swelling    Since September.   Irritable Bowel Syndrome   Shoulder Pain    left    Shoulder Pain    Discussed the use of AI scribe software for clinical note transcription with the patient, who gave verbal consent to proceed.  History of Present Illness   Sierra [name] is a 43 year old female who presents for medication management and refills.  She has a history of duodenal switch surgery for weight loss.  She is experiencing perimenopausal symptoms and is on hormonal support taking estrogen and progesterone pills, which have improved her sleep and overall well-being. She previously tried the patch and gel forms of estrogen but found them unsatisfactory.  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  Her irritable bowel syndrome (IBS) presents with diarrhea triggered by stress or certain foods, alternating with constipation. She takes Bentyl  (dicyclomine ) for IBS but finds it ineffective. She also uses Colace to manage constipation, adjusting the dose based on her symptoms. As a bariatric patient, she takes a multivitamin with iron and an iron supplement to prevent anemia.  She has a history of fibromyalgia and was previously on gabapentin  but discontinued it due to concerns about memory. She takes hydroxyzine  as needed for anxiety but dislikes it due to sedation. She is currently on Lamictal .  She is on Adderall 25 mg twice a day for ADHD, which she finds effective. She was previously on Qelbree, a non-stimulant ADHD medication, but found it ineffective. She also takes Vraylor for depression, which has been beneficial. She is  working with a therapist, sports.  She has a history of Raynaud's phenomenon and takes nifedipine  (Procardia ). She has never smoked and drinks alcohol occasionally, typically 2 glasses of wine on weekends.  She has a history of GERD and takes Prilosec (omeprazole ) but is currently out of it and experiencing symptoms. She also takes Singulair  (montelukast ) at bedtime for allergies.  She underwent a duodenal switch bariatric surgery and takes ADEK vitamins twice daily.   She reports left shoulder pain for the past one to two months, which worsens with certain movements.          Objective:     BP (!) 144/69 (BP Location: Right Arm, Patient Position: Sitting, Cuff Size: Large)   Pulse 74   Temp 98.1 F (36.7 C) (Oral)   Resp 18   Ht 5' 4 (1.626 m)   Wt (!) 323 lb (146.5 kg)   SpO2 98%   BMI 55.44 kg/m    Physical Exam Vitals and nursing note reviewed.  Constitutional:      Appearance: Normal appearance.  HENT:     Head: Normocephalic and atraumatic.  Eyes:     Conjunctiva/sclera: Conjunctivae normal.  Cardiovascular:     Rate and Rhythm: Normal rate and regular rhythm.  Pulmonary:     Effort: Pulmonary effort is normal.     Breath sounds: Normal breath sounds.  Musculoskeletal:     Right lower leg: No edema.     Left lower leg: No edema.  Skin:    General: Skin is warm and dry.  Neurological:  Mental Status: She is alert and oriented to person, place, and time.  Psychiatric:        Mood and Affect: Mood normal.        Behavior: Behavior normal.        Thought Content: Thought content normal.        Judgment: Judgment normal.          No results found for any visits on 11/27/23.    The 10-year ASCVD risk score (Arnett DK, et al., 2019) is: 0.7%    Assessment & Plan:  Allergy, subsequent encounter -     Montelukast  Sodium; Take 1 tablet (10 mg total) by mouth daily.  Dispense: 90 tablet; Refill: 3  Gastroesophageal reflux disease, unspecified whether  esophagitis present -     Omeprazole ; Take 1 capsule (40 mg total) by mouth daily.  Dispense: 90 capsule; Refill: 3  Raynaud's disease without gangrene -     NIFEdipine  ER Osmotic Release; Take 1 tablet (90 mg total) by mouth daily.  Dispense: 90 tablet; Refill: 3  Morbid obesity (HCC) -     Trulicity; Inject 0.75 mg into the skin once a week.  Dispense: 2 mL; Refill: 11   Irritable bowel syndrome with diarrhea Assessment & Plan: IBS with diarrhea alternating with constipation.  Diarrhea gets exacerbated by stress and dietary factors.  Then she has constipation and uses Colace 100 to 300 mg daily.  She adjust the Colace based on her symptoms.  She has been taking dicyclomine  10 mg 3 times daily with no benefit.  Offered her a trial of hyoscyamine 0.125 mg.  She has not had good success with fiber.  Orders: -     Hyoscyamine Sulfate; Take 1 tablet (0.125 mg total) by mouth every 4 (four) hours as needed for cramping.  Dispense: 90 tablet; Refill: 3        Gastroesophageal reflux disease Chronic gastroesophageal reflux disease, currently not well-controlled due to lack of omeprazole . - Refilled omeprazole  prescription.  Raynaud's syndrome Chronic Raynaud's syndrome, managed with nifedipine . - Refilled nifedipine  prescription.  Allergy, subsequent encounter -     Montelukast  Sodium; Take 1 tablet (10 mg total) by mouth daily.  Dispense: 90 tablet; Refill: 3  Gastroesophageal reflux disease, unspecified whether esophagitis present -     Omeprazole ; Take 1 capsule (40 mg total) by mouth daily.  Dispense: 90 capsule; Refill: 3  Raynaud's disease without gangrene -     NIFEdipine  ER Osmotic Release; Take 1 tablet (90 mg total) by mouth daily.  Dispense: 90 tablet; Refill: 3  Morbid obesity (HCC) Assessment & Plan: Developed welps at the site where she gave herself Mounjaro shots.  Had excellent response to Mounjaro losing 60 to 70 pounds.  She tried Ozempic and it was not effective.   Offered her trial of Trulicity.  Follow-up monthly.  Orders: -     Trulicity; Inject 0.75 mg into the skin once a week.  Dispense: 2 mL; Refill: 11  Acute pain of left shoulder Assessment & Plan: Has decreased range of motion and pain with certain movements.  Appears to be rotator cuff related.  Referral to physical therapy.  Orders: -     Ambulatory referral to Physical Therapy     Urge urinary incontinence Chronic urge urinary incontinence, well-managed with Myrbetriq. - Continue Myrbetriq.   Depressive disorder Depressive disorder, previously managed with Wellbutrin  and Lamictal . Currently on Lamictal  100 mg. - Continue Lamictal  100 mg.  Attention-deficit hyperactivity disorder Chronic attention-deficit hyperactivity disorder,  well-managed with Adderall. - Continue Adderall 25 mg twice daily.   General Health Maintenance Recent flu and COVID vaccinations received.           No follow-ups on file.    Derricka Mertz K Soren Pigman, MD

## 2023-11-27 NOTE — Assessment & Plan Note (Signed)
 IBS with diarrhea alternating with constipation.  Diarrhea gets exacerbated by stress and dietary factors.  Then she has constipation and uses Colace 100 to 300 mg daily.  She adjust the Colace based on her symptoms.  She has been taking dicyclomine  10 mg 3 times daily with no benefit.  Offered her a trial of hyoscyamine 0.125 mg.  She has not had good success with fiber.

## 2023-11-27 NOTE — Assessment & Plan Note (Signed)
 Developed welts at the site where she gave herself Mounjaro shots.  Had excellent response to Mounjaro losing 60 to 70 pounds.  She tried Ozempic and it was not effective.  Offered her trial of Trulicity.  Follow-up monthly.

## 2023-11-29 ENCOUNTER — Encounter: Payer: Self-pay | Admitting: Family Medicine

## 2023-12-27 ENCOUNTER — Ambulatory Visit: Admitting: Family Medicine

## 2023-12-27 ENCOUNTER — Encounter: Payer: Self-pay | Admitting: Family Medicine

## 2023-12-27 DIAGNOSIS — R600 Localized edema: Secondary | ICD-10-CM | POA: Diagnosis not present

## 2023-12-27 LAB — POCT GLYCOSYLATED HEMOGLOBIN (HGB A1C): Hemoglobin A1C: 4.8 % (ref 4.0–5.6)

## 2023-12-29 DIAGNOSIS — R600 Localized edema: Secondary | ICD-10-CM | POA: Insufficient documentation

## 2023-12-29 NOTE — Assessment & Plan Note (Signed)
 Advised that Georjean will come out in pill form in January and the price is gena be fixed at $150 a month.

## 2023-12-29 NOTE — Assessment & Plan Note (Signed)
 May be a side effect of nifedipine .  Hold the nifedipine  for 2 weeks and lets see if your swelling stops.

## 2023-12-29 NOTE — Progress Notes (Signed)
 Established Patient Office Visit  Subjective   Patient ID: Sierra [name], female    DOB: [birth year]  Age: 43 y.o. MRN: [id number]  Chief Complaint  Patient presents with   Medical Management of Chronic Issues    Insurance declined trulicity     HPI Discussed the use of AI scribe software for clinical note transcription with the patient, who gave verbal consent to proceed.  History of Present Illness   Sierra [name] is a delightful 43 year old female with s/p duodenal switch, symptomatic perimenopausal, urge UI (IBS, ADHD, Raynaud's phenomenon, GERD, who presents with concerns about medication coverage and leg swelling.  She experiences leg swelling, which she attributes to her work. She wears compression socks, but her legs remain swollen at the end of the day. Tighter socks cause discomfort at the top of her legs. She avoids high salt intake due to sensitivity since her duodenal switch surgery.  She has a history of duodenal switch surgery, initially losing weight from 421 pounds to 220 pounds, but has since regained a significant amount. She does not routinely follow up with the surgeon as he retired and the practice moved.  She is currently taking Myrbetriq for overactive bladder, which works well. She has a history of irritable bowel syndrome but has not had an episode since her last visit. She was previously on dicyclomine  but was switched to Hyoscyamine , which she has not needed to use yet.  She takes Adderall for ADHD and Procardia  (nifedipine ) for Raynaud's phenomenon. She reports symptoms of Raynaud's, including cramping, color changes in her hands and feet, and visible veins. She has never smoked and is unsure of the cause of her Raynaud's.  She recently experienced flu-like symptoms for three days, during which she continued to work. Her sons tested positive for the flu, but she did not get tested. She now has a sinus infection with fluid in her ear and is taking  Augmentin  and steroids.  Her A1c was tested today and found to be 4.8%. She is concerned about weight management and medication coverage, particularly regarding Trulicity  and potential future options like Wegovy.        Objective:     BP 102/70   Pulse 82   Ht 5' 4 (1.626 m)   Wt (!) 318 lb (144.2 kg)   SpO2 97%   BMI 54.58 kg/m    Physical Exam Vitals and nursing note reviewed.  Constitutional:      Appearance: Normal appearance.  HENT:     Head: Normocephalic and atraumatic.  Eyes:     Conjunctiva/sclera: Conjunctivae normal.  Cardiovascular:     Rate and Rhythm: Normal rate and regular rhythm.  Pulmonary:     Effort: Pulmonary effort is normal.     Breath sounds: Normal breath sounds.  Musculoskeletal:     Right lower leg: No edema.     Left lower leg: No edema.  Skin:    General: Skin is warm and dry.  Neurological:     Mental Status: She is alert and oriented to person, place, and time.  Psychiatric:        Mood and Affect: Mood normal.        Behavior: Behavior normal.        Thought Content: Thought content normal.        Judgment: Judgment normal.          Results for orders placed or performed in visit on 12/27/23  POCT glycosylated hemoglobin (Hb A1C)  Result Value Ref Range   Hemoglobin A1C 4.8 4.0 - 5.6 %   HbA1c POC (<> result, manual entry)     HbA1c, POC (prediabetic range)     HbA1c, POC (controlled diabetic range)        The 10-year ASCVD risk score (Arnett DK, et al., 2019) is: 0.4%    Assessment & Plan:  Morbid obesity [facility]) Assessment & Plan: Advised that Georjean will come out in pill form in January and the price is gena be fixed at $150 a month.   Orders: -     POCT glycosylated hemoglobin (Hb A1C)  Bilateral leg edema Assessment & Plan: May be a side effect of nifedipine .  Hold the nifedipine  for 2 weeks and lets see if your swelling stops.      Return in about 2 weeks (around 01/10/2024).    Desera Graffeo K Miamor Ayler, MD

## 2024-01-24 ENCOUNTER — Ambulatory Visit: Admitting: Family Medicine

## 2024-01-28 ENCOUNTER — Encounter: Payer: Self-pay | Admitting: Family Medicine

## 2024-01-28 ENCOUNTER — Ambulatory Visit: Admitting: Family Medicine

## 2024-01-28 VITALS — BP 139/76 | HR 98 | Temp 98.9°F | Resp 16 | Ht 64.0 in | Wt 328.6 lb

## 2024-01-28 DIAGNOSIS — R600 Localized edema: Secondary | ICD-10-CM | POA: Diagnosis not present

## 2024-01-28 DIAGNOSIS — I7300 Raynaud's syndrome without gangrene: Secondary | ICD-10-CM | POA: Diagnosis not present

## 2024-01-28 NOTE — Assessment & Plan Note (Signed)
 She stopped the nifedipine  that she used for Raynaud's and noted that the swelling in her legs improved.  She is not really interested in taking Lasix and potassium to keep the fluid off her legs.  She is considering staying off nifedipine  instead.

## 2024-01-28 NOTE — Assessment & Plan Note (Signed)
 She is not taking the nifedipine  for Raynaud's now and notices when she types that 2 of her fingers will be freezing cold and the others are normal.  She does not know if she can wear gloves and type.  She is going to decide whether or not she wants to stay on nifedipine  for Raynaud's.

## 2024-01-28 NOTE — Progress Notes (Signed)
 "  Established Patient Office Visit  Subjective   Patient ID: Sierra [name], female    DOB: September 11, 1980  Age: 44 y.o. MRN: [id number]  Chief Complaint  Patient presents with   Follow-up    Pt. Is here for a follow-up visit. Pt c/o bilateral leg swelling that's been going on for 4 months. Pt. Has some questions about weight management. Pt. Denies pain at this time.     HPI Discussed the use of AI scribe software for clinical note transcription with the patient, who gave verbal consent to proceed.  History of Present Illness   Sierra [name] is a delightful 44 year old female s/p duodenal switch, symptomatic perimenopause, urge UI, IBS, ADHD, Raynaud's phenomenon, GERD who presents with concerns about medication side effects and symptom management.  She experiences swelling in her legs, which she attributes to her Raynaud's medication. After discontinuing the medication, the swelling improved, but she now experiences cold fingers while typing, which is bothersome.  She has a history of urge urinary incontinence and takes Myrbetriq, which effectively manages her symptoms.  Her reflux is well-controlled as long as she avoids certain foods.  She takes Adderall for ADHD, 25 mg twice a day, and reports significant improvement in her performance and well-being since the dosage adjustment.  She gets her ADHD medications from a psychiatrist.    Her irritable bowel symptoms have improved significantly.  She recently restarted her allergy shots after a four-month hiatus and noted a slight fever, which she attributes to the immune response from the shots.  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        Objective:     BP 139/76 (Cuff Size: Large)   Pulse 98   Temp 98.9 F (37.2 C) (Oral)   Resp 16   Ht 5' 4 (1.626 m)   Wt (!) 328 lb 9.6 oz (149.1 kg)   SpO2 98%   BMI 56.40 kg/m    Physical Exam Vitals  reviewed.  Constitutional:      Appearance: Normal appearance.  HENT:     Head: Normocephalic.  Eyes:     General:        Right eye: No discharge.        Left eye: No discharge.  Cardiovascular:     Rate and Rhythm: Normal rate.  Pulmonary:     Effort: Pulmonary effort is normal.  Neurological:     Mental Status: She is alert and oriented to person, place, and time.  Psychiatric:        Mood and Affect: Mood normal.        Behavior: Behavior normal.        Thought Content: Thought content normal.        Judgment: Judgment normal.          No results found for any visits on 01/28/24.    The 10-year ASCVD risk score (Arnett DK, et al., 2019) is: 0.7%    Assessment & Plan:  Bilateral leg edema Assessment & Plan: She stopped the nifedipine  that she used for Raynaud's and noted that the swelling in her legs improved.  She is not really interested in taking Lasix and potassium to keep the fluid off her legs.  She is considering staying off nifedipine  instead.   Raynaud's disease without gangrene Assessment & Plan: She is not taking the nifedipine  for Raynaud's now and notices when she types that 2 of her  fingers will be freezing cold and the others are normal.  She does not know if she can wear gloves and type.  She is going to decide whether or not she wants to stay on nifedipine  for Raynaud's.      Return in about 3 months (around 04/27/2024).    Harvest Stanco K Javaughn Opdahl, MD "
